# Patient Record
Sex: Male | Born: 1945 | Race: Black or African American | Hispanic: No | Marital: Married | Smoking: Former smoker
Health system: Southern US, Community
[De-identification: ages and names within clinical notes are randomized; demographics above are authoritative.]

## PROBLEM LIST (undated history)

## (undated) DIAGNOSIS — G4733 Obstructive sleep apnea (adult) (pediatric): Secondary | ICD-10-CM

## (undated) DIAGNOSIS — I219 Acute myocardial infarction, unspecified: Secondary | ICD-10-CM

## (undated) DIAGNOSIS — I10 Essential (primary) hypertension: Secondary | ICD-10-CM

## (undated) DIAGNOSIS — J449 Chronic obstructive pulmonary disease, unspecified: Secondary | ICD-10-CM

## (undated) DIAGNOSIS — I639 Cerebral infarction, unspecified: Secondary | ICD-10-CM

## (undated) DIAGNOSIS — I509 Heart failure, unspecified: Secondary | ICD-10-CM

## (undated) HISTORY — PX: PERCUTANEOUS CORONARY STENT INTERVENTION (PCI-S): SHX6016

## (undated) HISTORY — DX: Acute myocardial infarction, unspecified: I21.9

## (undated) HISTORY — DX: Heart failure, unspecified: I50.9

## (undated) HISTORY — DX: Essential (primary) hypertension: I10

## (undated) HISTORY — DX: Obstructive sleep apnea (adult) (pediatric): G47.33

## (undated) HISTORY — DX: Chronic obstructive pulmonary disease, unspecified: J44.9

---

## 2008-12-06 ENCOUNTER — Ambulatory Visit: Payer: Self-pay | Admitting: Cardiology

## 2010-01-21 ENCOUNTER — Ambulatory Visit: Payer: Self-pay | Admitting: Cardiology

## 2010-01-21 ENCOUNTER — Inpatient Hospital Stay (HOSPITAL_COMMUNITY)
Admission: EM | Admit: 2010-01-21 | Discharge: 2010-01-23 | Payer: Self-pay | Source: Home / Self Care | Admitting: Emergency Medicine

## 2010-01-21 IMAGING — CT CT ANGIO CHEST
1 of 2 series · 19 of 32 positions shown · IV contrast (APPLIED)
Comparison: Chest radiograph [DATE].

CLINICAL DATA: Congestive heart failure.  Pleural effusion.
Shortness of breath.  Elevated D-dimer.  Chest pain.

CT ANGIOGRAPHY CHEST WITH CONTRAST
TECHNIQUE: Multidetector CT imaging of the chest was performed
using the standard protocol during bolus administration of
intravenous contrast.  Multiplanar CT image reconstructions
including MIPs were obtained to evaluate the vascular anatomy.
Contrast:  100 ml Omnipaque 300

[Series 5: pe thins @ 1mm · axial · 0.70mm/px · z∈[+1153,+1405]mm · 19 of 276 slices shown]
[im 12/276  lung]
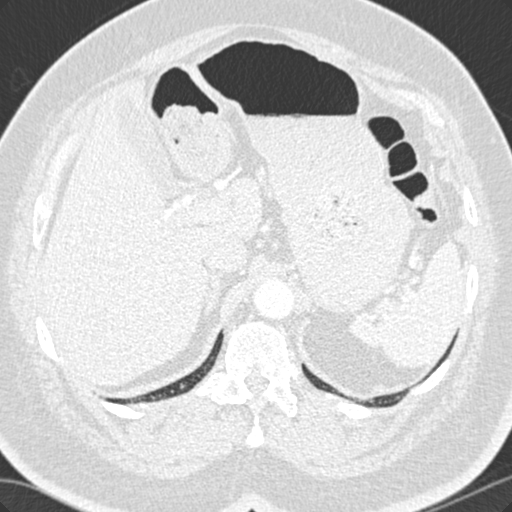
[im 24/276  soft-tissue]
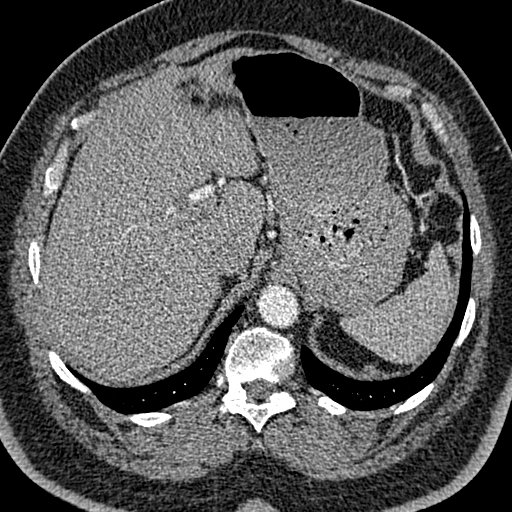
[im 36/276  lung]
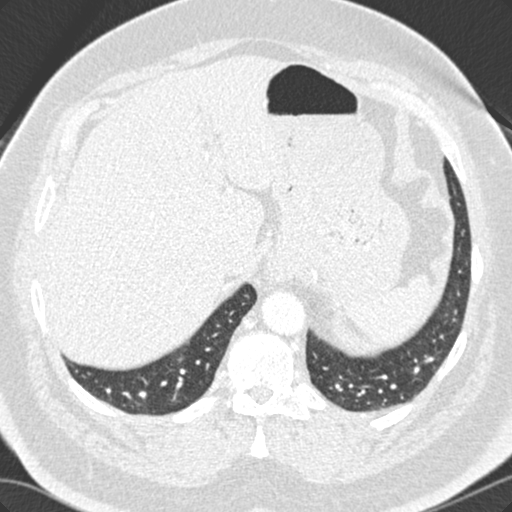
[im 60/276  soft-tissue]
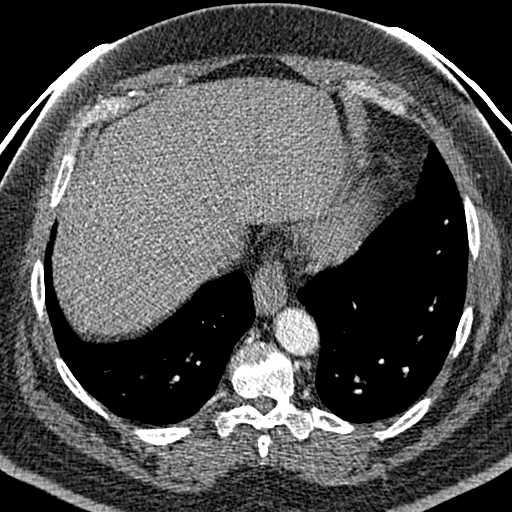
[im 72/276  lung]
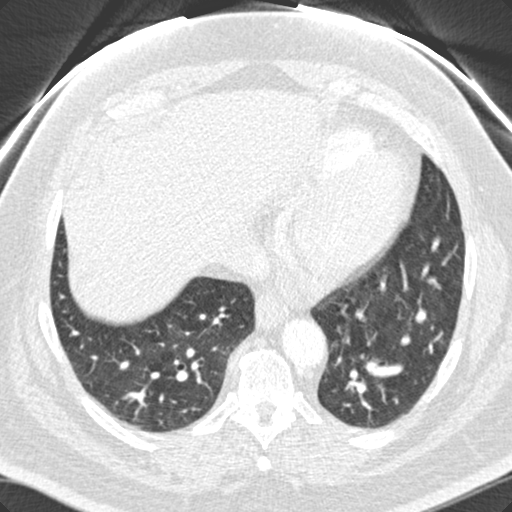
[im 84/276  soft-tissue]
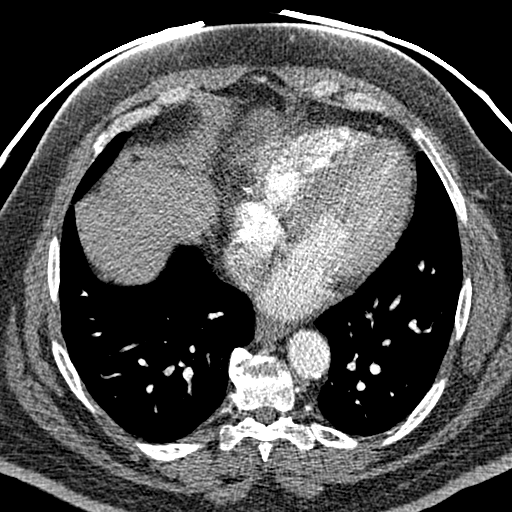
[im 96/276  lung]
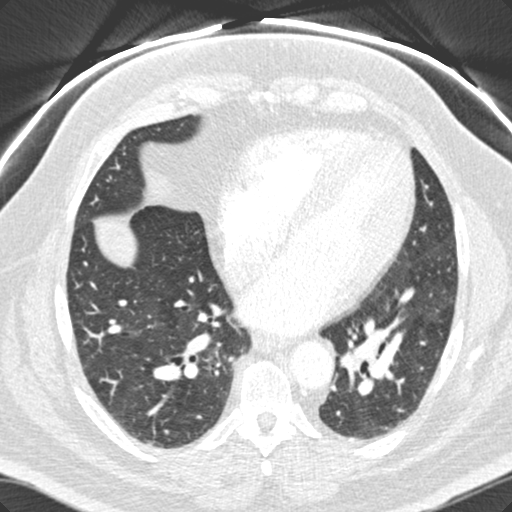
[im 108/276  soft-tissue]
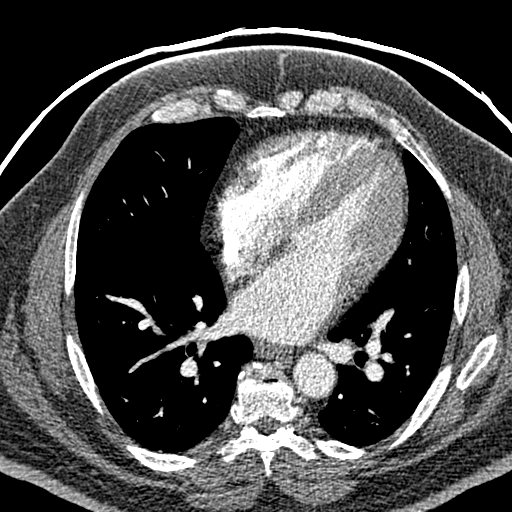
[im 120/276  lung]
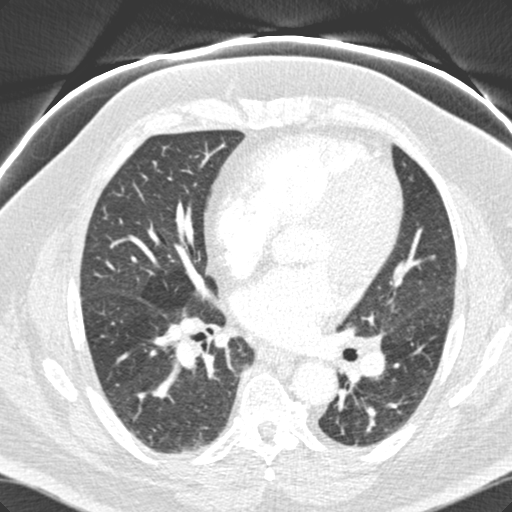
[im 144/276  soft-tissue]
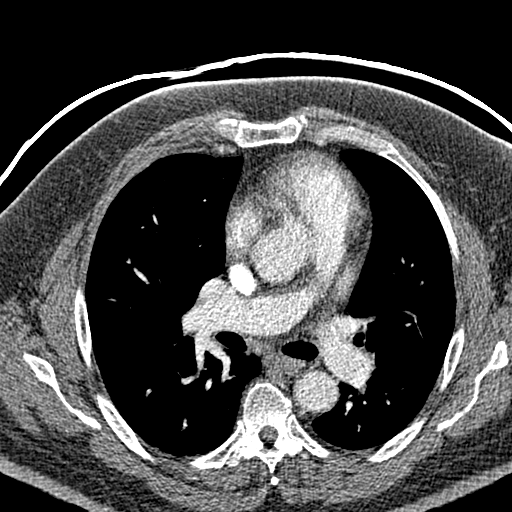
[im 156/276  lung]
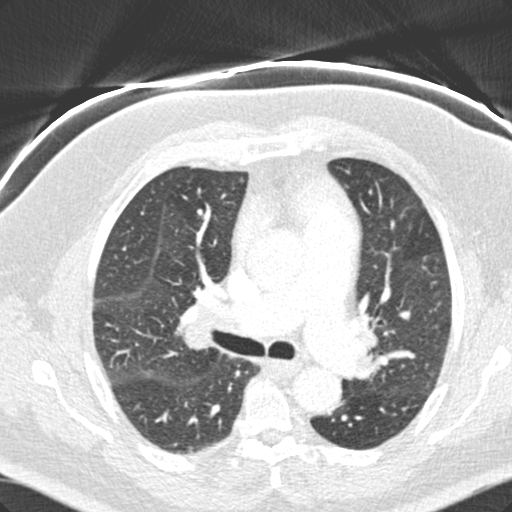
[im 168/276  soft-tissue]
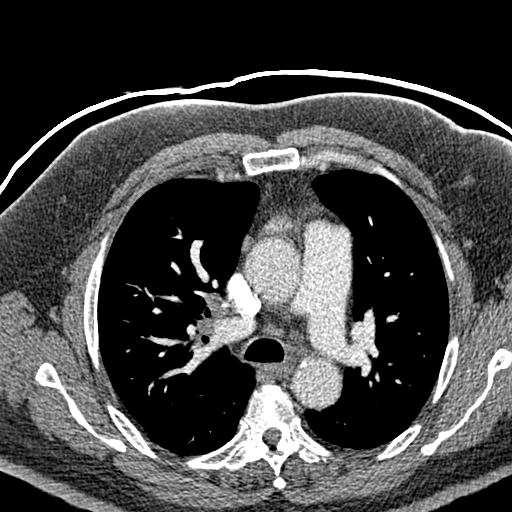
[im 180/276  lung]
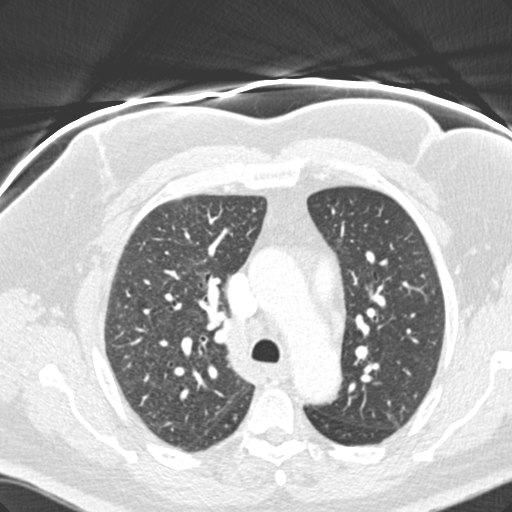
[im 192/276  soft-tissue]
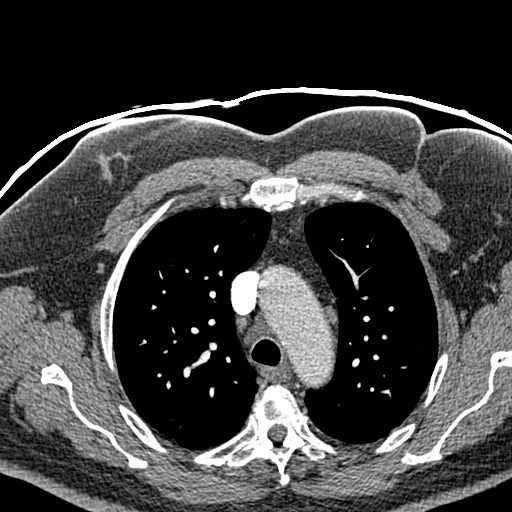
[im 204/276  lung]
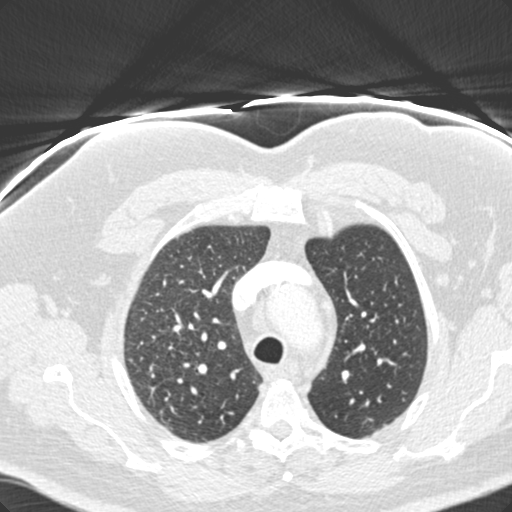
[im 216/276  soft-tissue]
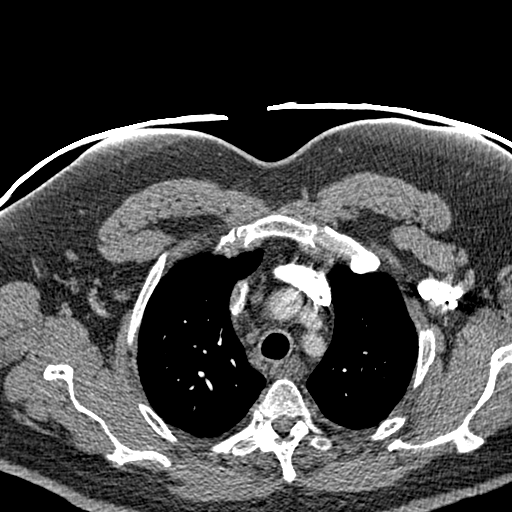
[im 240/276  lung]
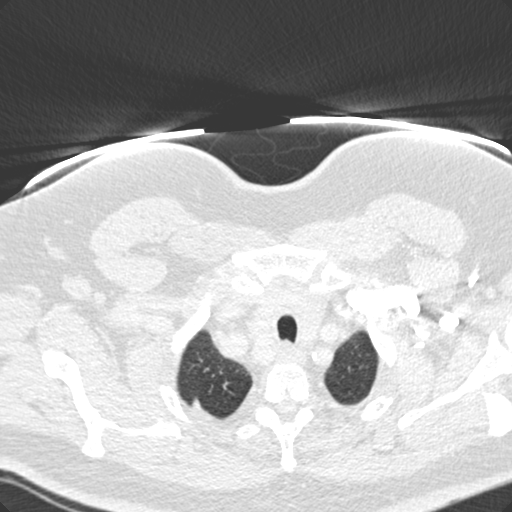
[im 252/276  soft-tissue]
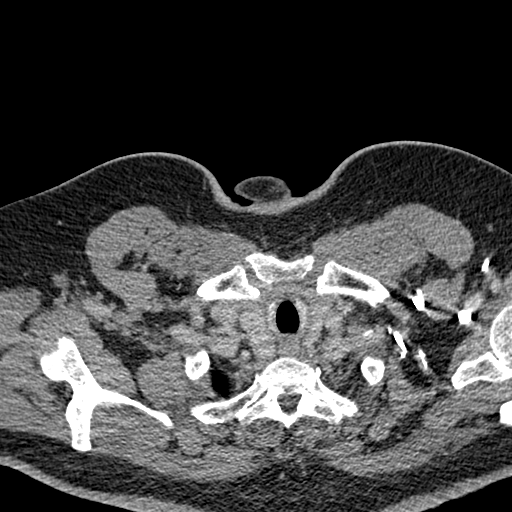
[im 264/276  lung]
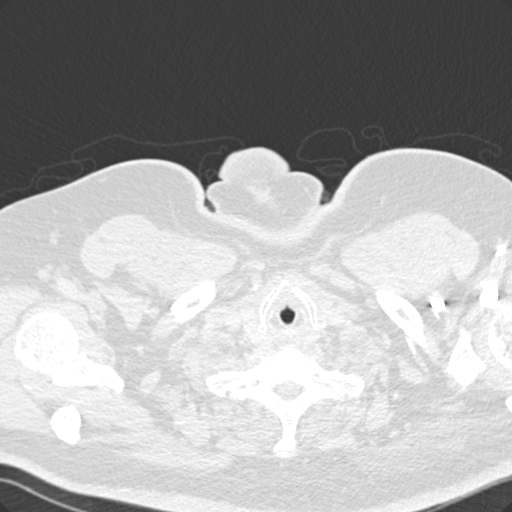

[19 of 32 positions shown; findings below may reference images not displayed]

FINDINGS: Pulmonary arterial opacification is suboptimal.  The
study is diagnostic, including the segmental vessels.  No focal
filling defects are evident to suggest pulmonary embolus.  Mild
atherosclerotic calcifications are noted in the aorta and branch
vessels.  Atherosclerotic calcifications are also noted in the
coronary arteries.  A pretracheal lymph node measures 12 mm in
short axis.  Sub centimeter right paratracheal nodes are evident.
No significant hilar adenopathy is present.  There is no
significant axillary adenopathy.

Mild intralobular septal thickening is noted, particularly the
right lung.  Scattered ground-glass attenuation suggests edema as
well.  No focal airspace consolidation is evident.

The bone windows demonstrate mild endplate degenerative change
across multiple levels.  There is mild exaggeration of the thoracic
kyphosis.  No focal lytic or blastic lesion is evident.

Review of the MIP images confirms the above findings.
IMPRESSION: 1.  No evidence for pulmonary embolus.
2.  Mild cardiomegaly.
3.  Atherosclerotic change including coronary artery disease.
4.  Edematous changes of the lungs bilaterally.
5.  Mediastinal adenopathy.  This is likely reactive in nature.
Follow-up CT of the chest in 2-3 months, after resolution of the
current symptoms, could be useful for further evaluation as
clinically indicated.

## 2010-01-22 ENCOUNTER — Encounter (INDEPENDENT_AMBULATORY_CARE_PROVIDER_SITE_OTHER): Payer: Self-pay | Admitting: Internal Medicine

## 2010-05-05 LAB — DIFFERENTIAL
Basophils Absolute: 0 10*3/uL (ref 0.0–0.1)
Basophils Relative: 0 % (ref 0–1)
Eosinophils Absolute: 0 10*3/uL (ref 0.0–0.7)
Eosinophils Relative: 0 % (ref 0–5)
Lymphocytes Relative: 12 % (ref 12–46)
Lymphs Abs: 0.9 10*3/uL (ref 0.7–4.0)
Monocytes Absolute: 0.1 10*3/uL (ref 0.1–1.0)
Monocytes Relative: 1 % — ABNORMAL LOW (ref 3–12)
Neutro Abs: 6.7 10*3/uL (ref 1.7–7.7)
Neutrophils Relative %: 87 % — ABNORMAL HIGH (ref 43–77)

## 2010-05-05 LAB — LIPID PANEL
Cholesterol: 164 mg/dL (ref 0–200)
HDL: 59 mg/dL (ref >39)
LDL Cholesterol: 96 mg/dL (ref 0–99)
Total CHOL/HDL Ratio: 2.8 RATIO
Triglycerides: 45 mg/dL (ref <150)
VLDL: 9 mg/dL (ref 0–40)

## 2010-05-05 LAB — TROPONIN I
Troponin I: 0.01 ng/mL (ref 0.00–0.06)
Troponin I: 0.01 ng/mL (ref 0.00–0.06)

## 2010-05-05 LAB — CBC
HCT: 42.8 % (ref 39.0–52.0)
Hemoglobin: 14.7 g/dL (ref 13.0–17.0)
MCH: 32 pg (ref 26.0–34.0)
MCHC: 34.3 g/dL (ref 30.0–36.0)
MCV: 93 fL (ref 78.0–100.0)
Platelets: 211 10*3/uL (ref 150–400)
RBC: 4.6 MIL/uL (ref 4.22–5.81)
RDW: 13.5 % (ref 11.5–15.5)
WBC: 7.7 10*3/uL (ref 4.0–10.5)

## 2010-05-05 LAB — BASIC METABOLIC PANEL
BUN: 21 mg/dL (ref 6–23)
BUN: 22 mg/dL (ref 6–23)
CO2: 29 mEq/L (ref 19–32)
CO2: 32 mEq/L (ref 19–32)
Calcium: 9.4 mg/dL (ref 8.4–10.5)
Calcium: 9.5 mg/dL (ref 8.4–10.5)
Chloride: 103 mEq/L (ref 96–112)
Chloride: 104 mEq/L (ref 96–112)
Creatinine, Ser: 1.54 mg/dL — ABNORMAL HIGH (ref 0.4–1.5)
Creatinine, Ser: 1.58 mg/dL — ABNORMAL HIGH (ref 0.4–1.5)
GFR calc Af Amer: 53 mL/min — ABNORMAL LOW (ref >60)
GFR calc Af Amer: 55 mL/min — ABNORMAL LOW (ref >60)
GFR calc non Af Amer: 44 mL/min — ABNORMAL LOW (ref >60)
GFR calc non Af Amer: 45 mL/min — ABNORMAL LOW (ref >60)
Glucose, Bld: 141 mg/dL — ABNORMAL HIGH (ref 70–99)
Glucose, Bld: 142 mg/dL — ABNORMAL HIGH (ref 70–99)
Potassium: 4 mEq/L (ref 3.5–5.1)
Potassium: 4.1 mEq/L (ref 3.5–5.1)
Sodium: 142 mEq/L (ref 135–145)
Sodium: 143 mEq/L (ref 135–145)

## 2010-05-05 LAB — CK TOTAL AND CKMB (NOT AT ARMC)
CK, MB: 1.1 ng/mL (ref 0.3–4.0)
CK, MB: 1.1 ng/mL (ref 0.3–4.0)
Relative Index: 0.8 (ref 0.0–2.5)
Relative Index: 0.9 (ref 0.0–2.5)
Total CK: 129 U/L (ref 7–232)
Total CK: 139 U/L (ref 7–232)

## 2010-05-05 LAB — BRAIN NATRIURETIC PEPTIDE: Pro B Natriuretic peptide (BNP): <30 pg/mL (ref 0.0–100.0)

## 2010-05-05 LAB — PROTIME-INR
INR: 1.09 (ref 0.00–1.49)
Prothrombin Time: 14.3 seconds (ref 11.6–15.2)

## 2010-05-05 LAB — HEMOGLOBIN A1C
Hgb A1c MFr Bld: 6.1 % — ABNORMAL HIGH (ref <5.7)
Mean Plasma Glucose: 128 mg/dL — ABNORMAL HIGH (ref <117)

## 2010-05-05 LAB — TSH: TSH: 0.191 u[IU]/mL — ABNORMAL LOW (ref 0.350–4.500)

## 2010-05-05 LAB — MAGNESIUM: Magnesium: 2.3 mg/dL (ref 1.5–2.5)

## 2010-05-05 LAB — PHOSPHORUS: Phosphorus: 3.6 mg/dL (ref 2.3–4.6)

## 2010-05-06 LAB — POCT CARDIAC MARKERS
CKMB, poc: <1 ng/mL — ABNORMAL LOW (ref 1.0–8.0)
Myoglobin, poc: 126 ng/mL (ref 12–200)
Troponin i, poc: <0.05 ng/mL (ref 0.00–0.09)

## 2010-05-06 LAB — URINALYSIS, ROUTINE W REFLEX MICROSCOPIC
Bilirubin Urine: NEGATIVE
Glucose, UA: NEGATIVE mg/dL
Hgb urine dipstick: NEGATIVE
Ketones, ur: NEGATIVE mg/dL
Nitrite: NEGATIVE
Protein, ur: NEGATIVE mg/dL
Specific Gravity, Urine: 1.018 (ref 1.005–1.030)
Urobilinogen, UA: 1 mg/dL (ref 0.0–1.0)
pH: 6 (ref 5.0–8.0)

## 2010-05-06 LAB — BASIC METABOLIC PANEL
BUN: 19 mg/dL (ref 6–23)
CO2: 31 mEq/L (ref 19–32)
Calcium: 9.4 mg/dL (ref 8.4–10.5)
Chloride: 101 mEq/L (ref 96–112)
Creatinine, Ser: 1.76 mg/dL — ABNORMAL HIGH (ref 0.4–1.5)
GFR calc Af Amer: 47 mL/min — ABNORMAL LOW (ref >60)
GFR calc non Af Amer: 39 mL/min — ABNORMAL LOW (ref >60)
Glucose, Bld: 140 mg/dL — ABNORMAL HIGH (ref 70–99)
Potassium: 3 mEq/L — ABNORMAL LOW (ref 3.5–5.1)
Sodium: 142 mEq/L (ref 135–145)

## 2010-05-06 LAB — CK TOTAL AND CKMB (NOT AT ARMC)
CK, MB: 1.2 ng/mL (ref 0.3–4.0)
Relative Index: 0.9 (ref 0.0–2.5)
Total CK: 134 U/L (ref 7–232)

## 2010-05-06 LAB — TROPONIN I: Troponin I: 0.02 ng/mL (ref 0.00–0.06)

## 2010-05-06 LAB — DIFFERENTIAL
Basophils Absolute: 0 10*3/uL (ref 0.0–0.1)
Basophils Relative: 0 % (ref 0–1)
Eosinophils Absolute: 0.1 10*3/uL (ref 0.0–0.7)
Eosinophils Relative: 1 % (ref 0–5)
Lymphocytes Relative: 24 % (ref 12–46)
Lymphs Abs: 2.2 10*3/uL (ref 0.7–4.0)
Monocytes Absolute: 0.7 10*3/uL (ref 0.1–1.0)
Monocytes Relative: 8 % (ref 3–12)
Neutro Abs: 6.3 10*3/uL (ref 1.7–7.7)
Neutrophils Relative %: 67 % (ref 43–77)

## 2010-05-06 LAB — D-DIMER, QUANTITATIVE (NOT AT ARMC): D-Dimer, Quant: 1.29 ug/mL-FEU — ABNORMAL HIGH (ref 0.00–0.48)

## 2010-05-06 LAB — CBC
HCT: 44.8 % (ref 39.0–52.0)
Hemoglobin: 15.5 g/dL (ref 13.0–17.0)
MCH: 32.4 pg (ref 26.0–34.0)
MCHC: 34.6 g/dL (ref 30.0–36.0)
MCV: 93.5 fL (ref 78.0–100.0)
Platelets: 203 10*3/uL (ref 150–400)
RBC: 4.79 MIL/uL (ref 4.22–5.81)
RDW: 13.3 % (ref 11.5–15.5)
WBC: 9.3 10*3/uL (ref 4.0–10.5)

## 2010-05-06 LAB — BRAIN NATRIURETIC PEPTIDE: Pro B Natriuretic peptide (BNP): <30 pg/mL (ref 0.0–100.0)

## 2016-12-16 ENCOUNTER — Other Ambulatory Visit: Payer: Self-pay | Admitting: *Deleted

## 2016-12-16 NOTE — Patient Outreach (Signed)
Telephone screen attempted for [facility] member. No one answered the phone, I was able to leave a voice message and requested a return call.  I will call this gentleman another day.  Eulah Pont. [name], MSN, [facility] Gerontological Nurse Practitioner [facility] Care Management [phone]

## 2016-12-21 ENCOUNTER — Other Ambulatory Visit: Payer: Self-pay | Admitting: *Deleted

## 2016-12-21 NOTE — Patient Outreach (Signed)
[employer] screening call attempted, unable to leave a message. I will call this gentleman another day.  Charles Sherman. [name], MSN, [facility] Gerontological Nurse Practitioner [facility] Care Management [phone]

## 2016-12-21 NOTE — Patient Outreach (Signed)
Second attempt to complete [facility] HRA. Unable to reach anyone at the provided phone numbers. I will try again and if not successful will send a letter.  Charles Sherman. [name], MSN, [facility] Gerontological Nurse Practitioner [facility] Care Management [phone]

## 2016-12-27 ENCOUNTER — Other Ambulatory Visit: Payer: Self-pay | Admitting: *Deleted

## 2016-12-27 ENCOUNTER — Encounter: Payer: Self-pay | Admitting: *Deleted

## 2016-12-27 NOTE — Patient Outreach (Signed)
Third attempt to complete [facility] HRA assessment unsuccessful. I did leave another message and advised I would be send information about [place] Management services.  Eulah Pont. [name], MSN, [facility] Gerontological Nurse Practitioner [facility] Care Management [phone]

## 2016-12-28 ENCOUNTER — Encounter: Payer: Self-pay | Admitting: *Deleted

## 2019-01-08 ENCOUNTER — Other Ambulatory Visit: Payer: Self-pay

## 2019-01-08 ENCOUNTER — Encounter (INDEPENDENT_AMBULATORY_CARE_PROVIDER_SITE_OTHER): Payer: No Typology Code available for payment source | Admitting: Ophthalmology

## 2019-01-08 DIAGNOSIS — H35033 Hypertensive retinopathy, bilateral: Secondary | ICD-10-CM | POA: Diagnosis not present

## 2019-01-08 DIAGNOSIS — H43813 Vitreous degeneration, bilateral: Secondary | ICD-10-CM

## 2019-01-08 DIAGNOSIS — H2513 Age-related nuclear cataract, bilateral: Secondary | ICD-10-CM

## 2019-01-08 DIAGNOSIS — H3411 Central retinal artery occlusion, right eye: Secondary | ICD-10-CM

## 2019-01-08 DIAGNOSIS — I10 Essential (primary) hypertension: Secondary | ICD-10-CM

## 2019-01-10 ENCOUNTER — Encounter (INDEPENDENT_AMBULATORY_CARE_PROVIDER_SITE_OTHER): Payer: Self-pay | Admitting: Ophthalmology

## 2019-02-05 ENCOUNTER — Other Ambulatory Visit: Payer: Self-pay | Admitting: Family Medicine

## 2019-02-05 ENCOUNTER — Other Ambulatory Visit (HOSPITAL_COMMUNITY): Payer: Self-pay | Admitting: Family Medicine

## 2019-02-05 DIAGNOSIS — H3410 Central retinal artery occlusion, unspecified eye: Secondary | ICD-10-CM

## 2019-02-07 ENCOUNTER — Encounter (INDEPENDENT_AMBULATORY_CARE_PROVIDER_SITE_OTHER): Payer: No Typology Code available for payment source | Admitting: Ophthalmology

## 2019-02-07 ENCOUNTER — Other Ambulatory Visit: Payer: Self-pay

## 2019-02-07 DIAGNOSIS — H35033 Hypertensive retinopathy, bilateral: Secondary | ICD-10-CM

## 2019-02-07 DIAGNOSIS — H43813 Vitreous degeneration, bilateral: Secondary | ICD-10-CM

## 2019-02-07 DIAGNOSIS — I10 Essential (primary) hypertension: Secondary | ICD-10-CM

## 2019-02-07 DIAGNOSIS — H3411 Central retinal artery occlusion, right eye: Secondary | ICD-10-CM

## 2019-02-07 DIAGNOSIS — H2513 Age-related nuclear cataract, bilateral: Secondary | ICD-10-CM

## 2019-02-27 ENCOUNTER — Ambulatory Visit (HOSPITAL_COMMUNITY): Payer: No Typology Code available for payment source

## 2019-02-27 ENCOUNTER — Encounter (HOSPITAL_COMMUNITY): Payer: Self-pay

## 2019-02-27 ENCOUNTER — Encounter: Payer: Self-pay | Admitting: *Deleted

## 2019-02-28 ENCOUNTER — Ambulatory Visit: Payer: Self-pay | Admitting: Cardiology

## 2019-02-28 ENCOUNTER — Other Ambulatory Visit (HOSPITAL_COMMUNITY): Payer: Self-pay | Admitting: Family Medicine

## 2019-02-28 DIAGNOSIS — H3410 Central retinal artery occlusion, unspecified eye: Secondary | ICD-10-CM

## 2019-02-28 NOTE — Progress Notes (Deleted)
Clinical Summary Charles Sherman is a 74 y.o.male  1. CAD - notes indicate multiple prior PCIs - from [place] notes Aug 2018 nuclear stress without ischemia  ?stress   2. Heart failure with preserved LVEF - notes indicate normal LVEF with mild diastolic dysfunction by [id number] echo   3. OSA on cpap  4. HTN   5. Hyperlipidemia   6. COPD on home O2  Past Medical History:  Diagnosis Date  . CHF (congestive heart failure) ([place])   . COPD (chronic obstructive pulmonary disease) ([place])   . HTN (hypertension)   . Myocardial infarct ([place])   . OSA (obstructive sleep apnea)    on CPAP     Allergies  Allergen Reactions  . Shrimp [Shellfish Allergy]      Current Outpatient Medications  Medication Sig Dispense Refill  . albuterol (VENTOLIN HFA) 108 (90 Base) MCG/ACT inhaler Inhale 2 puffs into the lungs every 6 (six) hours as needed for wheezing or shortness of breath.    Marland Kitchen amLODipine (NORVASC) 10 MG tablet Take 5 mg by mouth daily.    Marland Kitchen aspirin EC 81 MG tablet Take 81 mg by mouth daily.    Marland Kitchen atorvastatin (LIPITOR) 40 MG tablet Take 40 mg by mouth daily.    Marland Kitchen docusate sodium (COLACE) 100 MG capsule Take 100 mg by mouth daily as needed for mild constipation.    . furosemide (LASIX) 80 MG tablet Take 80 mg by mouth 2 (two) times daily.    . hydrochlorothiazide (HYDRODIURIL) 25 MG tablet Take 25 mg by mouth 3 (three) times daily. Do not take if SBP is <100    . isosorbide mononitrate (IMDUR) 120 MG 24 hr tablet Take 120 mg by mouth daily.    . lansoprazole (PREVACID) 15 MG capsule Take 15 mg by mouth daily as needed.    . metolazone (ZAROXOLYN) 2.5 MG tablet Take 2.5 mg by mouth once a week. On Mondays 30 minutes before furosemide. May take twice daily as needed    . metoprolol tartrate (LOPRESSOR) 50 MG tablet Take 50 mg by mouth 2 (two) times daily. Do not take if pulse is < 55 or SBP is < 100    . Multiple Vitamin (MULTIVITAMIN) tablet Take 1 tablet by mouth daily.    .  nitroGLYCERIN (NITROSTAT) 0.4 MG SL tablet Place 0.4 mg under the tongue every 5 (five) minutes x 3 doses as needed for chest pain (If no relief after 3 rd dose report to the ED).    Marland Kitchen nystatin cream (MYCOSTATIN) Apply 1 application topically 2 (two) times daily as needed for dry skin. 15 gm    . potassium chloride (KLOR-CON) 10 MEQ tablet Take 20 mEq by mouth 2 (two) times daily. Except take three tablets twice day on Mondays only     No current facility-administered medications for this visit.     Past Surgical History:  Procedure Laterality Date  . PERCUTANEOUS CORONARY STENT INTERVENTION (PCI-S)       Allergies  Allergen Reactions  . Shrimp [Shellfish Allergy]       No family history on file.   Social History Mr. Whelihan reports that he has been smoking cigarettes. He has been smoking about 0.50 packs per day. He does not have any smokeless tobacco history on file. Mr. Belli has no history on file for alcohol.   Review of Systems CONSTITUTIONAL: No weight loss, fever, chills, weakness or fatigue.  HEENT: Eyes: No visual loss, blurred vision, double  vision or yellow sclerae.No hearing loss, sneezing, congestion, runny nose or sore throat.  SKIN: No rash or itching.  CARDIOVASCULAR:  RESPIRATORY: No shortness of breath, cough or sputum.  GASTROINTESTINAL: No anorexia, nausea, vomiting or diarrhea. No abdominal pain or blood.  GENITOURINARY: No burning on urination, no polyuria NEUROLOGICAL: No headache, dizziness, syncope, paralysis, ataxia, numbness or tingling in the extremities. No change in bowel or bladder control.  MUSCULOSKELETAL: No muscle, back pain, joint pain or stiffness.  LYMPHATICS: No enlarged nodes. No history of splenectomy.  PSYCHIATRIC: No history of depression or anxiety.  ENDOCRINOLOGIC: No reports of sweating, cold or heat intolerance. No polyuria or polydipsia.  Marland Kitchen   Physical Examination There were no vitals filed for this visit. There  were no vitals filed for this visit.  Gen: resting comfortably, no acute distress HEENT: no scleral icterus, pupils equal round and reactive, no palptable cervical adenopathy,  CV Resp: Clear to auscultation bilaterally GI: abdomen is soft, non-tender, non-distended, normal bowel sounds, no hepatosplenomegaly MSK: extremities are warm, no edema.  Skin: warm, no rash Neuro:  no focal deficits Psych: appropriate affect   Diagnostic Studies  07/2015 [facility] LVEF "norma" but no percentage given, grade I DDX, normal LA   Assessment and Plan        Arnoldo Lenis, M.D., F.A.C.C.

## 2019-03-08 ENCOUNTER — Ambulatory Visit (HOSPITAL_COMMUNITY)
Admission: RE | Admit: 2019-03-08 | Discharge: 2019-03-08 | Disposition: A | Discharge status: Home / Self Care | Payer: No Typology Code available for payment source | Source: Ambulatory Visit | Attending: Family Medicine | Admitting: Family Medicine

## 2019-03-08 ENCOUNTER — Other Ambulatory Visit: Payer: Self-pay

## 2019-03-08 DIAGNOSIS — H3410 Central retinal artery occlusion, unspecified eye: Secondary | ICD-10-CM | POA: Insufficient documentation

## 2019-03-08 IMAGING — US US CAROTID DUPLEX BILAT
1 series · 13 of 24 positions shown · non-contrast
Comparison: None.

CLINICAL DATA: Right retinal artery occlusion

EXAM:
BILATERAL CAROTID DUPLEX ULTRASOUND
TECHNIQUE: Gray scale imaging, color Doppler and duplex ultrasound were
performed of bilateral carotid and vertebral arteries in the neck.

[Series 1: us carotid duplex bilat · 13 of 70 slices shown]
[im 1/70]
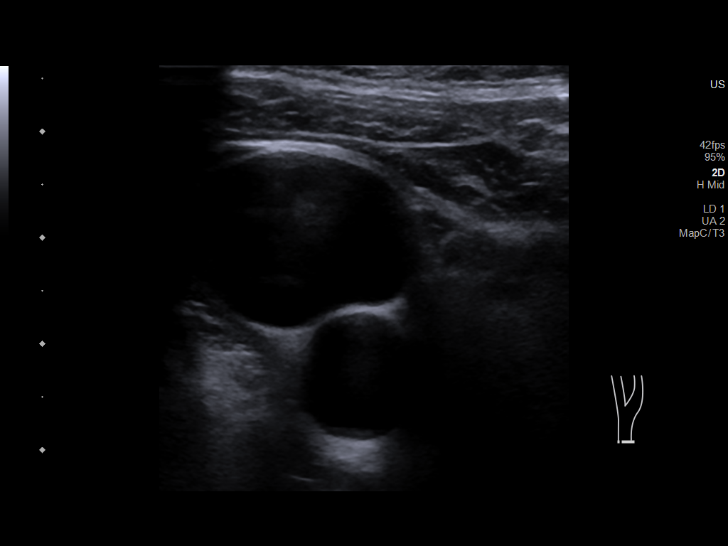
[im 7/70]
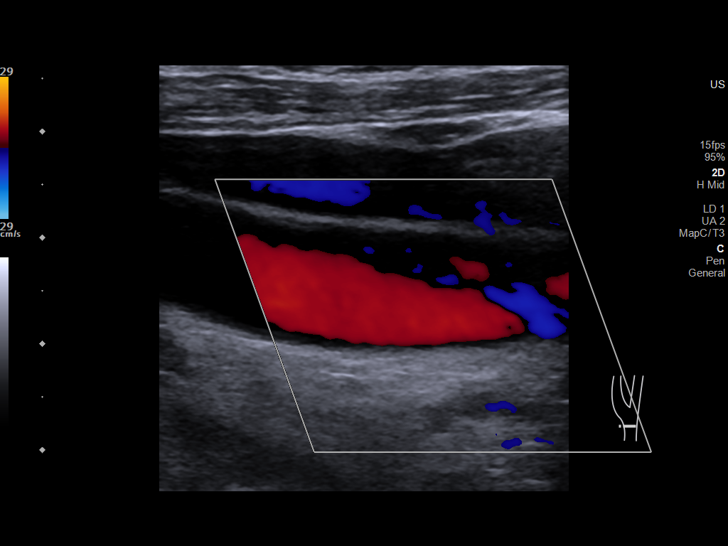
[im 13/70]
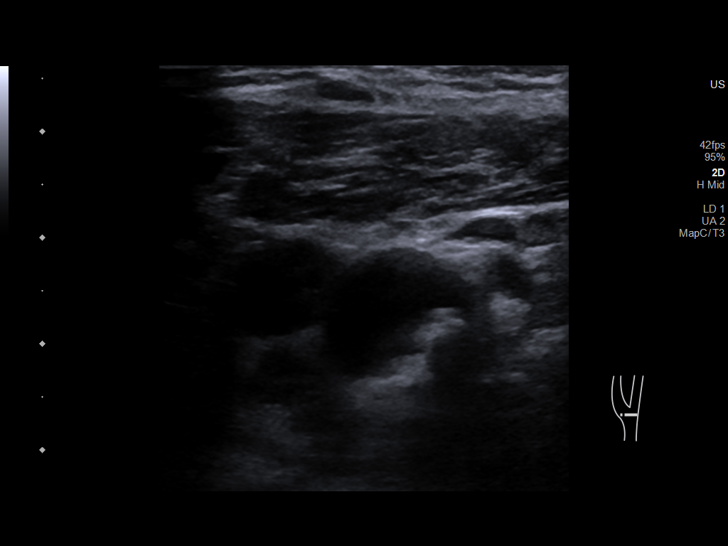
[im 19/70]
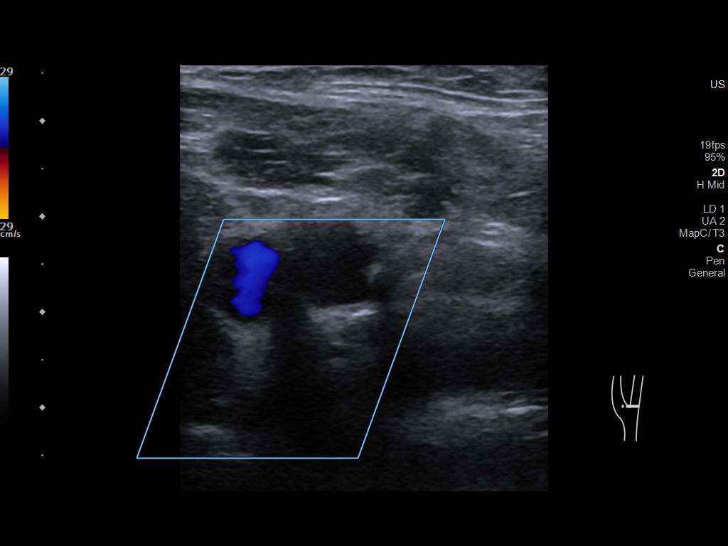
[im 25/70]
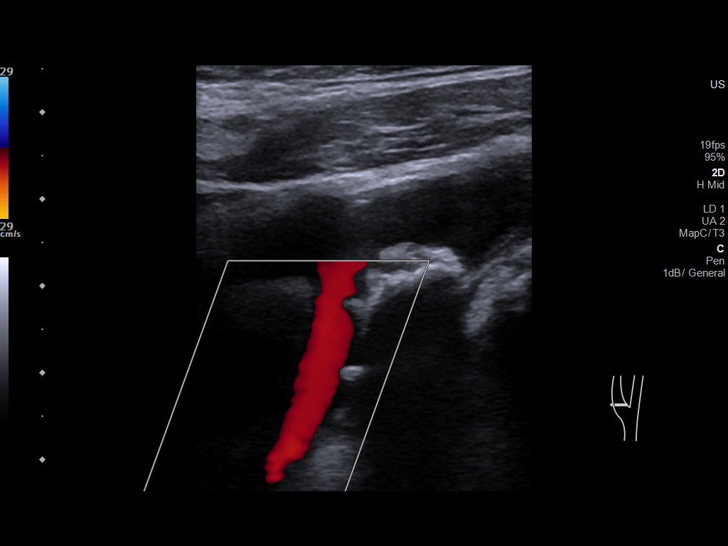
[im 31/70]
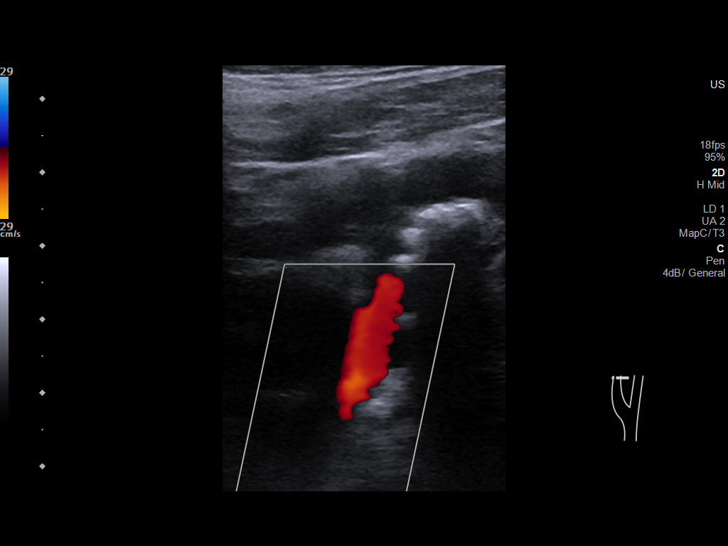
[im 37/70]
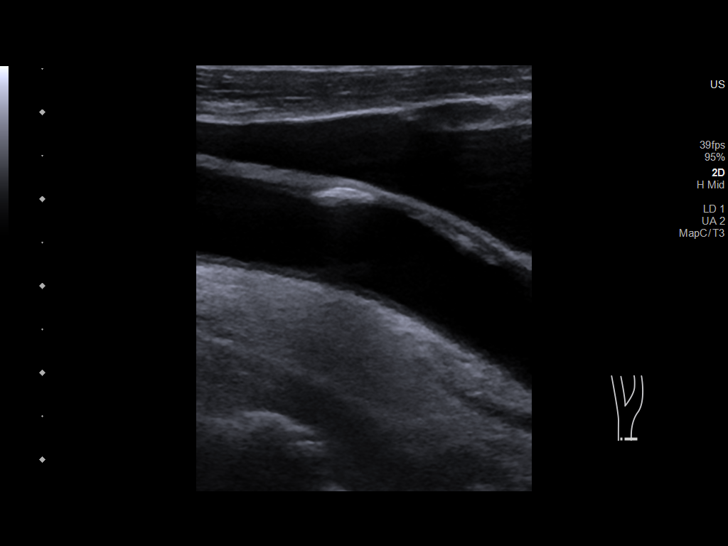
[im 40/70]
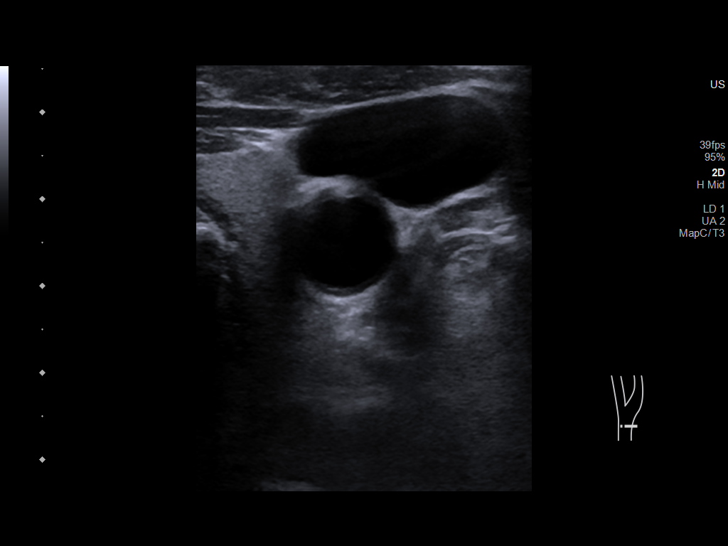
[im 46/70]
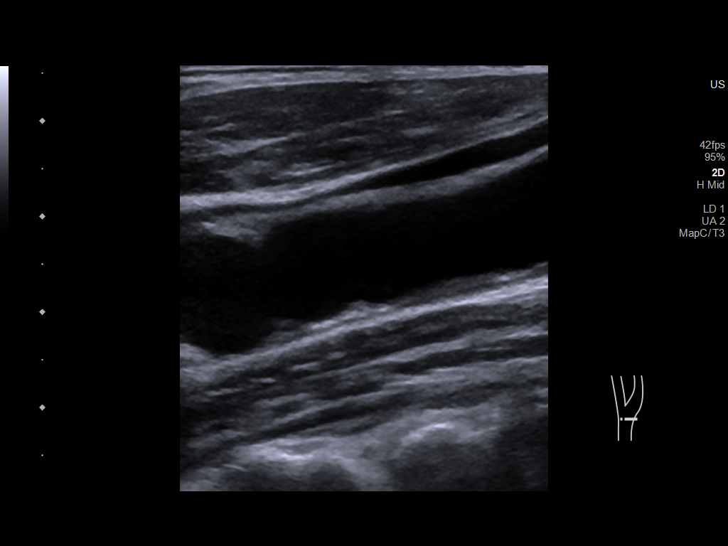
[im 52/70]
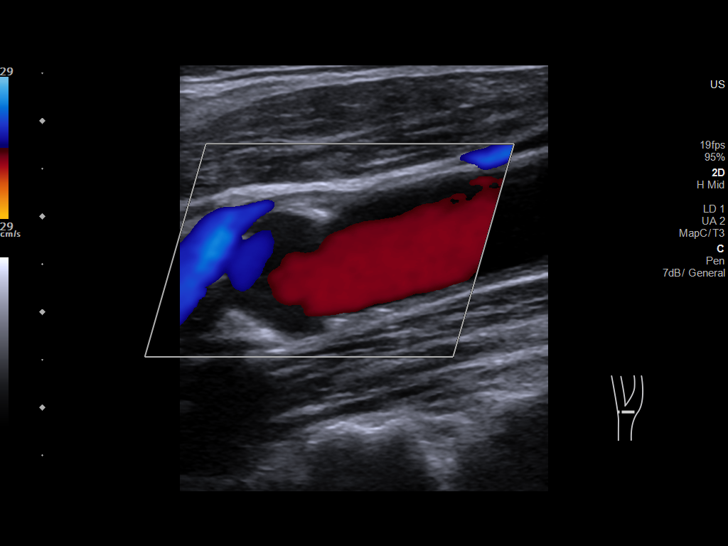
[im 58/70]
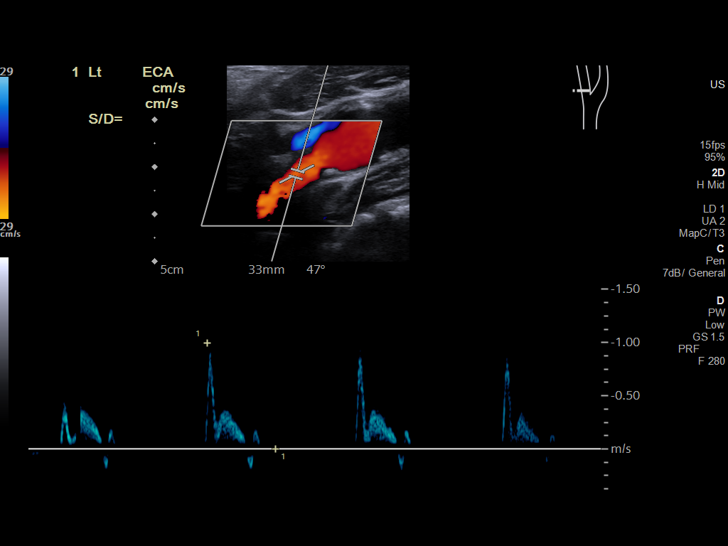
[im 64/70]
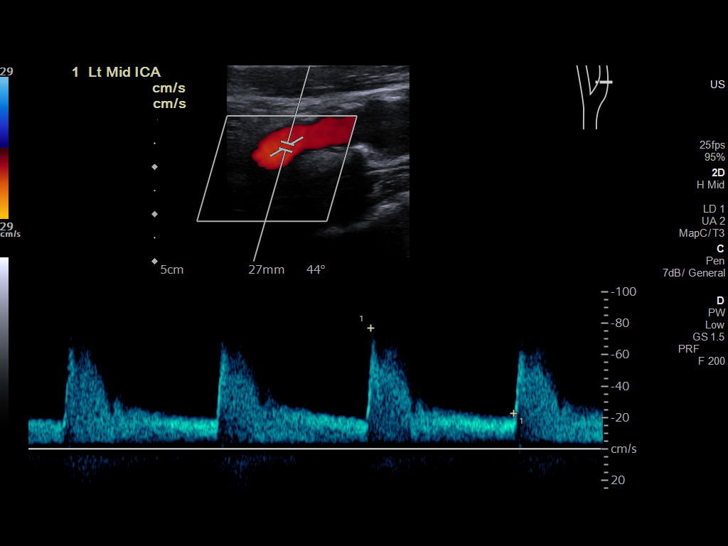
[im 70/70]
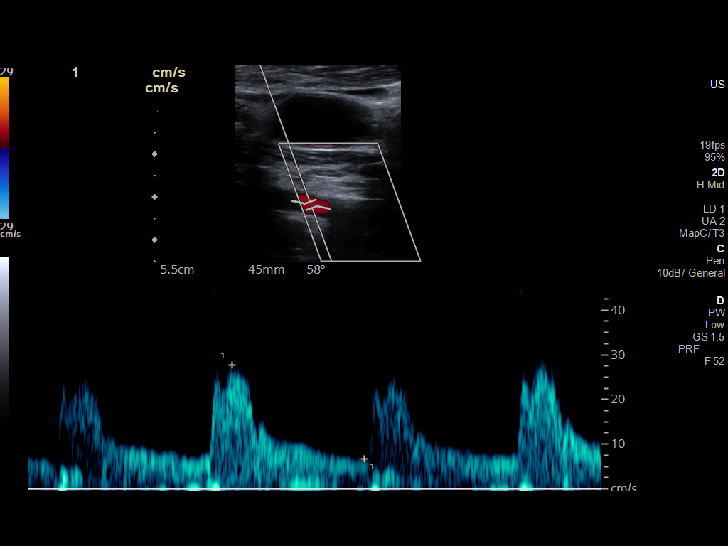

[13 of 24 positions shown; findings below may reference images not displayed]

FINDINGS: Criteria: Quantification of carotid stenosis is based on velocity
parameters that correlate the residual internal carotid diameter
with NASCET-based stenosis levels, using the diameter of the distal
internal carotid lumen as the denominator for stenosis measurement.

The following velocity measurements were obtained:

RIGHT

ICA: 59/10 cm/sec

CCA: 69/11 cm/sec

SYSTOLIC ICA/CCA RATIO:

ECA: 94 cm/sec

LEFT

ICA: 77/23 cm/sec

CCA: 74/10 cm/sec

SYSTOLIC ICA/CCA RATIO:

ECA: 100 cm/sec

RIGHT CAROTID ARTERY: Moderate echogenic shadowing plaque formation.
No hemodynamically significant right ICA stenosis, velocity
elevation, or turbulent flow. Degree of narrowing less than 50%.

RIGHT VERTEBRAL ARTERY:  Antegrade

LEFT CAROTID ARTERY: Similar scattered minor echogenic plaque
formation. No hemodynamically significant left ICA stenosis,
velocity elevation, or turbulent flow.

LEFT VERTEBRAL ARTERY:  Antegrade
IMPRESSION: Bilateral carotid atherosclerosis. No hemodynamically significant
ICA stenosis. Degree of narrowing less than 50% bilaterally by
ultrasound criteria.

Patent antegrade vertebral flow bilaterally

## 2019-03-19 ENCOUNTER — Other Ambulatory Visit: Payer: Self-pay

## 2019-03-19 ENCOUNTER — Ambulatory Visit (INDEPENDENT_AMBULATORY_CARE_PROVIDER_SITE_OTHER): Payer: No Typology Code available for payment source | Admitting: Cardiology

## 2019-03-19 ENCOUNTER — Encounter: Payer: Self-pay | Admitting: Cardiology

## 2019-03-19 VITALS — BP 163/71 | HR 50 | Ht 71.0 in | Wt 265.2 lb

## 2019-03-19 DIAGNOSIS — I10 Essential (primary) hypertension: Secondary | ICD-10-CM

## 2019-03-19 DIAGNOSIS — I639 Cerebral infarction, unspecified: Secondary | ICD-10-CM | POA: Diagnosis not present

## 2019-03-19 DIAGNOSIS — I5032 Chronic diastolic (congestive) heart failure: Secondary | ICD-10-CM

## 2019-03-19 DIAGNOSIS — I2510 Atherosclerotic heart disease of native coronary artery without angina pectoris: Secondary | ICD-10-CM | POA: Diagnosis not present

## 2019-03-19 DIAGNOSIS — H349 Unspecified retinal vascular occlusion: Secondary | ICD-10-CM

## 2019-03-19 MED ORDER — METOPROLOL TARTRATE 25 MG PO TABS: 25.0000 mg | ORAL_TABLET | Freq: Two times a day (BID) | ORAL | 3 refills | Status: DC

## 2019-03-19 NOTE — Progress Notes (Signed)
Clinical Summary Charles Sherman is a 74 y.o.male seen today as a new patient. He is followed at the [facility] for his primary care and for cardiology.    1. CAD - limited notes available. Prior discharge summary mentions CAD with "PCI x 3 times"  - from [place] notes last PCI 2010 - nuclear stress 10/2016 no ischemia  - no recent chest pain. No SOB/DOE    2.Chronic diastolic HF - [id number] echo LVEF 55-60%, no WMAs, grade I diastolic dysfunction - from [place] echo 07/2015 LVEF "normal with mild DDx).   - no recent edema - diuretic regimen is per his physicians at the [place]   3. COPD   4. Retinal artery occlusion/CVA - Jan 2021 carotid US <50% bilaterally -  5. Bradycardia - no symptoms    [facility] providers: PCP, cardiology Past Medical History:  Diagnosis Date  . CHF (congestive heart failure) ([place])   . COPD (chronic obstructive pulmonary disease) ([place])   . HTN (hypertension)   . Myocardial infarct ([place])   . OSA (obstructive sleep apnea)    on CPAP     Allergies  Allergen Reactions  . Shrimp [Shellfish Allergy]      Current Outpatient Medications  Medication Sig Dispense Refill  . albuterol (VENTOLIN HFA) 108 (90 Base) MCG/ACT inhaler Inhale 2 puffs into the lungs every 6 (six) hours as needed for wheezing or shortness of breath.    Marland Kitchen amLODipine (NORVASC) 10 MG tablet Take 5 mg by mouth daily.    Marland Kitchen aspirin EC 81 MG tablet Take 81 mg by mouth daily.    Marland Kitchen atorvastatin (LIPITOR) 40 MG tablet Take 40 mg by mouth daily.    Marland Kitchen docusate sodium (COLACE) 100 MG capsule Take 100 mg by mouth daily as needed for mild constipation.    . furosemide (LASIX) 80 MG tablet Take 80 mg by mouth 2 (two) times daily.    . hydrochlorothiazide (HYDRODIURIL) 25 MG tablet Take 25 mg by mouth 3 (three) times daily. Do not take if SBP is <100    . isosorbide mononitrate (IMDUR) 120 MG 24 hr tablet Take 120 mg by mouth daily.    . lansoprazole (PREVACID) 15 MG capsule Take 15 mg by mouth daily as needed.     . metolazone (ZAROXOLYN) 2.5 MG tablet Take 2.5 mg by mouth once a week. On Mondays 30 minutes before furosemide. May take twice daily as needed    . metoprolol tartrate (LOPRESSOR) 50 MG tablet Take 50 mg by mouth 2 (two) times daily. Do not take if pulse is < 55 or SBP is < 100    . Multiple Vitamin (MULTIVITAMIN) tablet Take 1 tablet by mouth daily.    . nitroGLYCERIN (NITROSTAT) 0.4 MG SL tablet Place 0.4 mg under the tongue every 5 (five) minutes x 3 doses as needed for chest pain (If no relief after 3 rd dose report to the ED).    Marland Kitchen nystatin cream (MYCOSTATIN) Apply 1 application topically 2 (two) times daily as needed for dry skin. 15 gm    . potassium chloride (KLOR-CON) 10 MEQ tablet Take 20 mEq by mouth 2 (two) times daily. Except take three tablets twice day on Mondays only     No current facility-administered medications for this visit.     Past Surgical History:  Procedure Laterality Date  . PERCUTANEOUS CORONARY STENT INTERVENTION (PCI-S)       Allergies  Allergen Reactions  . Shrimp [Shellfish Allergy]  No family history on file.   Social History Mr. Lundstrom reports that he has been smoking cigarettes. He has been smoking about 0.50 packs per day. He does not have any smokeless tobacco history on file. Mr. Roedl has no history on file for alcohol.   Review of Systems CONSTITUTIONAL: No weight loss, fever, chills, weakness or fatigue.  HEENT: Eyes: +right eye vision lossNo hearing loss, sneezing, congestion, runny nose or sore throat.  SKIN: No rash or itching.  CARDIOVASCULAR: per hpi RESPIRATORY: No shortness of breath, cough or sputum.  GASTROINTESTINAL: No anorexia, nausea, vomiting or diarrhea. No abdominal pain or blood.  GENITOURINARY: No burning on urination, no polyuria NEUROLOGICAL: No headache, dizziness, syncope, paralysis, ataxia, numbness or tingling in the extremities. No change in bowel or bladder control.  MUSCULOSKELETAL: No  muscle, back pain, joint pain or stiffness.  LYMPHATICS: No enlarged nodes. No history of splenectomy.  PSYCHIATRIC: No history of depression or anxiety.  ENDOCRINOLOGIC: No reports of sweating, cold or heat intolerance. No polyuria or polydipsia.  Marland Kitchen   Physical Examination Today's Vitals   03/19/19 1026  BP: (!) 163/71  Pulse: (!) 50  SpO2: 98%  Weight: 265 lb 3.2 oz (120.3 kg)  Height: 5\' 11"  (1.803 m)   Body mass index is 36.99 kg/m.  Gen: resting comfortably, no acute distress HEENT: no scleral icterus, pupils equal round and reactive, no palptable cervical adenopathy,  CVL RRR, 2/6 systolic murmur apex, no jvd Resp: Clear to auscultation bilaterally GI: abdomen is soft, non-tender, non-distended, normal bowel sounds, no hepatosplenomegaly MSK: extremities are warm, no edema.  Skin: warm, no rash Neuro:  no focal deficits Psych: appropriate affect     Assessment and Plan  1. Retinal artery occlusion -reason for referal. We will look for possible cardiac etiologies with echo and 30 day event monitor - recent benign carotid Dopplers - continue ASA, statin  2. CAD - no recent symptoms - stress test 2018 no ischemioa - continue medical therapy  3. Chronic diastolic HF - appears euvolemic - defer diuretic regimen to [place] who have been taking care of him  4. HTN - on recheck 130/70, at goal, continue current meds - nontraditional HCTZ dosing, I defer to his pcp and regular cardiologist.   5. Bradycardia - EKG today shows sinus brady 46 - he is asymptomatic - lower lopressor to 25mg  bid    F/u pending echo and monitor results.   Arnoldo Lenis, M.D

## 2019-03-19 NOTE — Patient Instructions (Addendum)
Medication Instructions:    Your physician has recommended you make the following change in your medication:   Decrease metoprolol tartrate to 25 mg by mouth twice daily. You may break your 50 mg tablets in half twice daily until they are finished.  Continue other medications the same  Labwork:  NONE  Testing/Procedures: Your physician has requested that you have an echocardiogram. Echocardiography is a painless test that uses sound waves to create images of your heart. It provides your doctor with information about the size and shape of your heart and how well your heart's chambers and valves are working. This procedure takes approximately one hour. There are no restrictions for this procedure. Your physician has recommended that you wear an event monitor for 30 days. Event monitors are medical devices that record the heart's electrical activity. Doctors most often [place] these monitors to diagnose arrhythmias. Arrhythmias are problems with the speed or rhythm of the heartbeat. The monitor is a small, portable device. You can wear one while you do your normal daily activities. This is usually used to diagnose what is causing palpitations/syncope (passing out). [employer] will contact you about this monitor.   Follow-Up:  Your physician recommends that you schedule a follow-up appointment in: pending.  Any Other Special Instructions Will Be Listed Below (If Applicable).  If you need a refill on your cardiac medications before your next appointment, please call your pharmacy.

## 2019-03-28 ENCOUNTER — Encounter (INDEPENDENT_AMBULATORY_CARE_PROVIDER_SITE_OTHER): Payer: No Typology Code available for payment source

## 2019-03-28 ENCOUNTER — Other Ambulatory Visit: Payer: Self-pay

## 2019-03-28 ENCOUNTER — Ambulatory Visit (INDEPENDENT_AMBULATORY_CARE_PROVIDER_SITE_OTHER): Payer: No Typology Code available for payment source

## 2019-03-28 DIAGNOSIS — I639 Cerebral infarction, unspecified: Secondary | ICD-10-CM

## 2019-04-11 ENCOUNTER — Telehealth: Payer: Self-pay | Admitting: *Deleted

## 2019-04-11 NOTE — Telephone Encounter (Signed)
Pt voiced understanding - routed to pcp  

## 2019-04-11 NOTE — Telephone Encounter (Signed)
-----   Message from Arnoldo Lenis, MD sent at 04/10/2019 12:38 PM EST ----- Echo overall looks good, normal heart function. We will f/u his heart monitor results. If normal we may do one additional portion of the echocardiogram called a bubble study to verify there is no signs of any abnormal areas of the heart   Zandra Abts MD

## 2019-05-09 ENCOUNTER — Encounter (INDEPENDENT_AMBULATORY_CARE_PROVIDER_SITE_OTHER): Payer: No Typology Code available for payment source | Admitting: Ophthalmology

## 2019-05-24 ENCOUNTER — Emergency Department (HOSPITAL_COMMUNITY)
Admission: EM | Admit: 2019-05-24 | Discharge: 2019-05-24 | Disposition: A | Discharge status: Home / Self Care | Payer: No Typology Code available for payment source | Attending: Emergency Medicine | Admitting: Emergency Medicine

## 2019-05-24 ENCOUNTER — Encounter (HOSPITAL_COMMUNITY): Payer: Self-pay | Admitting: Emergency Medicine

## 2019-05-24 ENCOUNTER — Emergency Department (HOSPITAL_COMMUNITY): Payer: No Typology Code available for payment source

## 2019-05-24 DIAGNOSIS — I110 Hypertensive heart disease with heart failure: Secondary | ICD-10-CM | POA: Diagnosis not present

## 2019-05-24 DIAGNOSIS — D329 Benign neoplasm of meninges, unspecified: Secondary | ICD-10-CM | POA: Diagnosis not present

## 2019-05-24 DIAGNOSIS — R9402 Abnormal brain scan: Secondary | ICD-10-CM | POA: Diagnosis present

## 2019-05-24 DIAGNOSIS — J449 Chronic obstructive pulmonary disease, unspecified: Secondary | ICD-10-CM | POA: Insufficient documentation

## 2019-05-24 DIAGNOSIS — I509 Heart failure, unspecified: Secondary | ICD-10-CM | POA: Insufficient documentation

## 2019-05-24 DIAGNOSIS — F17210 Nicotine dependence, cigarettes, uncomplicated: Secondary | ICD-10-CM | POA: Insufficient documentation

## 2019-05-24 DIAGNOSIS — Z7982 Long term (current) use of aspirin: Secondary | ICD-10-CM | POA: Diagnosis not present

## 2019-05-24 DIAGNOSIS — I252 Old myocardial infarction: Secondary | ICD-10-CM | POA: Diagnosis not present

## 2019-05-24 LAB — CBC WITH DIFFERENTIAL/PLATELET
Abs Immature Granulocytes: 0.03 10*3/uL (ref 0.00–0.07)
Basophils Absolute: 0 10*3/uL (ref 0.0–0.1)
Basophils Relative: 0 %
Eosinophils Absolute: 0.1 10*3/uL (ref 0.0–0.5)
Eosinophils Relative: 2 %
HCT: 40.6 % (ref 39.0–52.0)
Hemoglobin: 13.5 g/dL (ref 13.0–17.0)
Immature Granulocytes: 0 %
Lymphocytes Relative: 23 %
Lymphs Abs: 1.9 10*3/uL (ref 0.7–4.0)
MCH: 32.3 pg (ref 26.0–34.0)
MCHC: 33.3 g/dL (ref 30.0–36.0)
MCV: 97.1 fL (ref 80.0–100.0)
Monocytes Absolute: 0.7 10*3/uL (ref 0.1–1.0)
Monocytes Relative: 8 %
Neutro Abs: 5.5 10*3/uL (ref 1.7–7.7)
Neutrophils Relative %: 67 %
Platelets: 176 10*3/uL (ref 150–400)
RBC: 4.18 MIL/uL — ABNORMAL LOW (ref 4.22–5.81)
RDW: 14.6 % (ref 11.5–15.5)
WBC: 8.3 10*3/uL (ref 4.0–10.5)
nRBC: 0 % (ref 0.0–0.2)

## 2019-05-24 LAB — BASIC METABOLIC PANEL
Anion gap: 6 (ref 5–15)
BUN: 21 mg/dL (ref 8–23)
CO2: 26 mmol/L (ref 22–32)
Calcium: 9.2 mg/dL (ref 8.9–10.3)
Chloride: 110 mmol/L (ref 98–111)
Creatinine, Ser: 1.56 mg/dL — ABNORMAL HIGH (ref 0.61–1.24)
GFR calc Af Amer: 50 mL/min — ABNORMAL LOW (ref >60)
GFR calc non Af Amer: 43 mL/min — ABNORMAL LOW (ref >60)
Glucose, Bld: 129 mg/dL — ABNORMAL HIGH (ref 70–99)
Potassium: 4 mmol/L (ref 3.5–5.1)
Sodium: 142 mmol/L (ref 135–145)

## 2019-05-24 IMAGING — CT CT HEAD W/O CM
4 series · 15 of 47 positions shown, 17 images · non-contrast
Comparison: None.

CLINICAL DATA: Headache.  Possible mass on outside imaging.

EXAM:
CT HEAD WITHOUT CONTRAST
TECHNIQUE: Contiguous axial images were obtained from the base of the skull
through the vertex without intravenous contrast.

[Series 2: head without · axial · non-contrast · 0.46mm/px · z∈[-99,+26]mm · 7 of 35 slices shown, 9 images]
[im 5/35  brain]
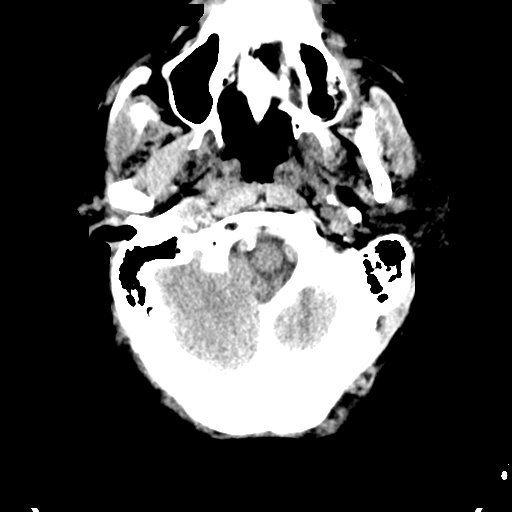
[im 5/35  bone]
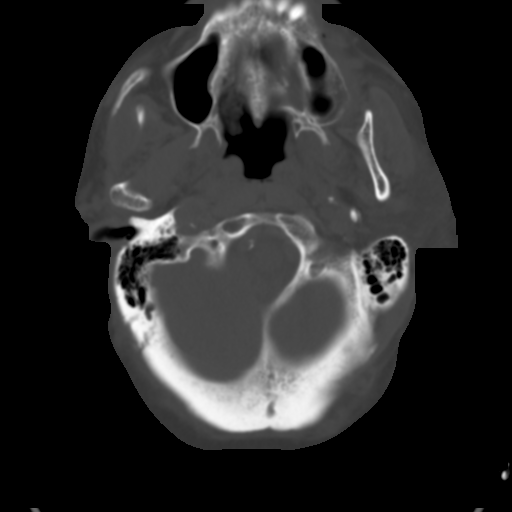
[im 9/35  brain]
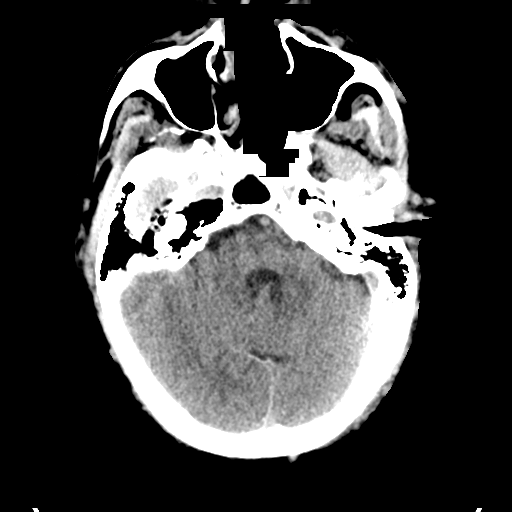
[im 13/35  brain]
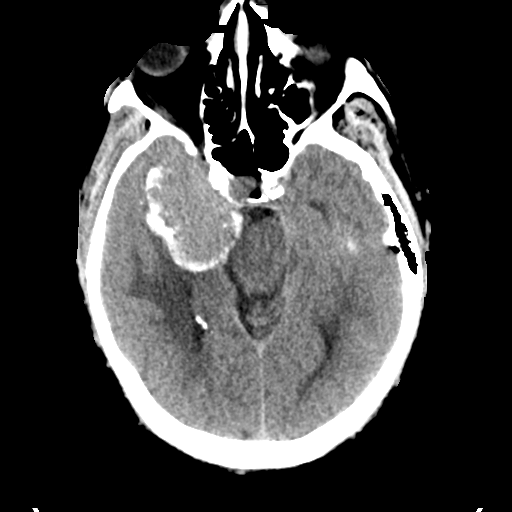
[im 18/35  brain]
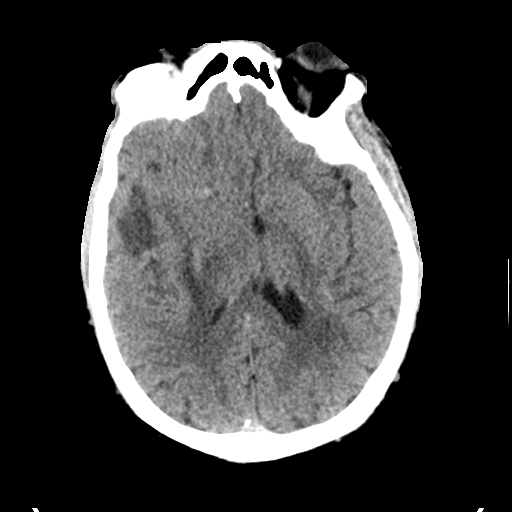
[im 22/35  brain]
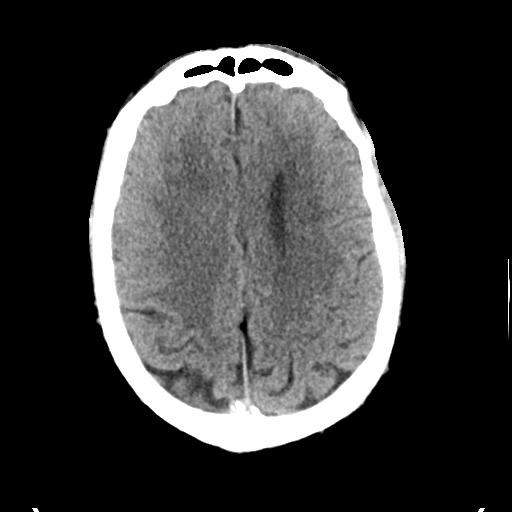
[im 22/35  bone]
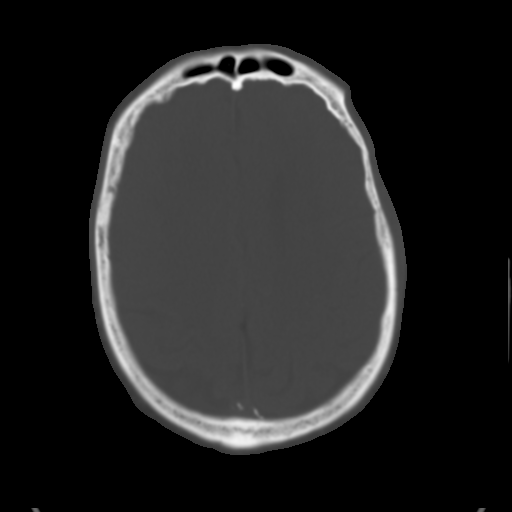
[im 26/35  brain]
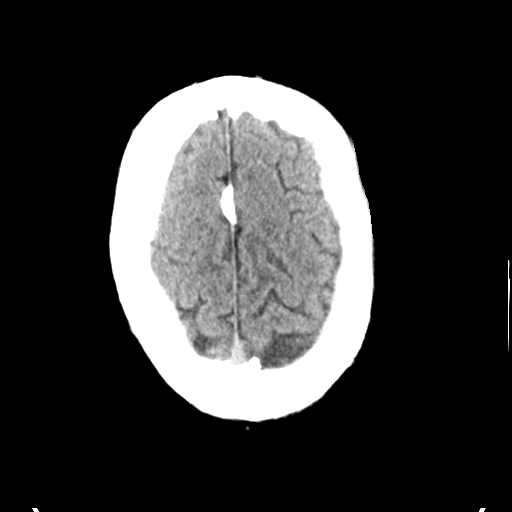
[im 30/35  brain]
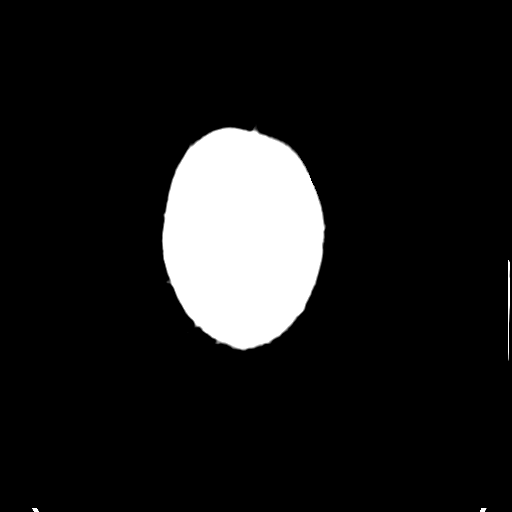

[Series 3: head bone · axial · 0.46mm/px · z∈[-103,-85]mm · 2 of 87 slices shown]
[im 9/87  bone]
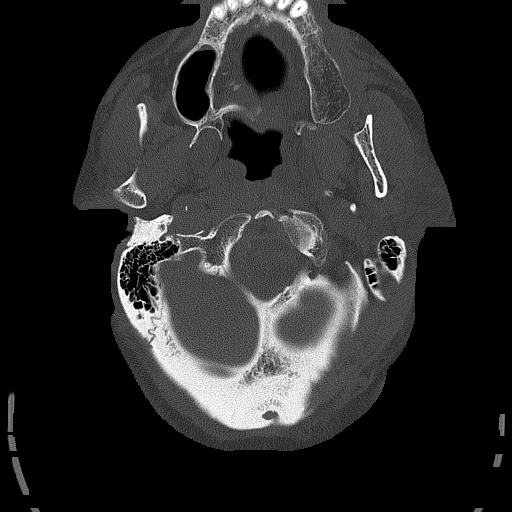
[im 18/87  bone]
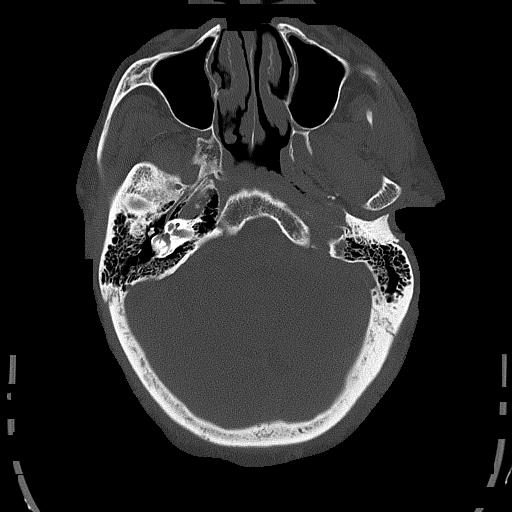

[Series 4: head without cor · coronal · non-contrast · 0.36mm/px · 3 of 78 slices shown]
[im 26/78  brain]
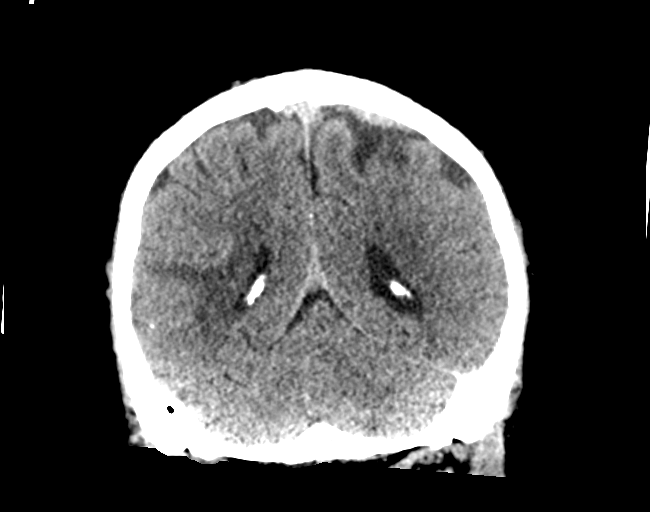
[im 35/78  brain]
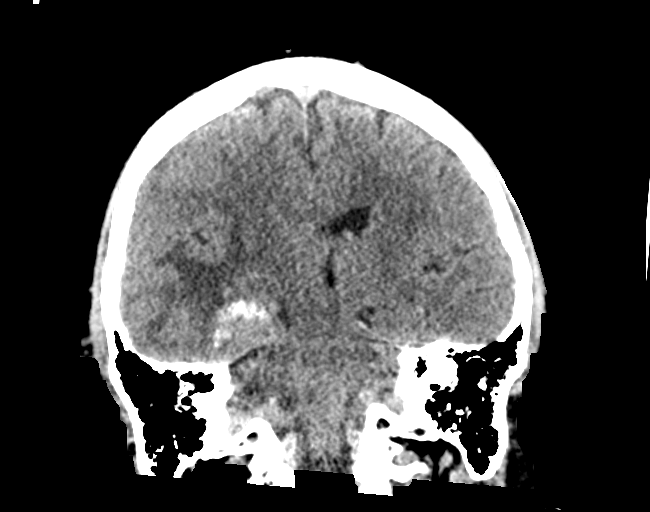
[im 43/78  brain]
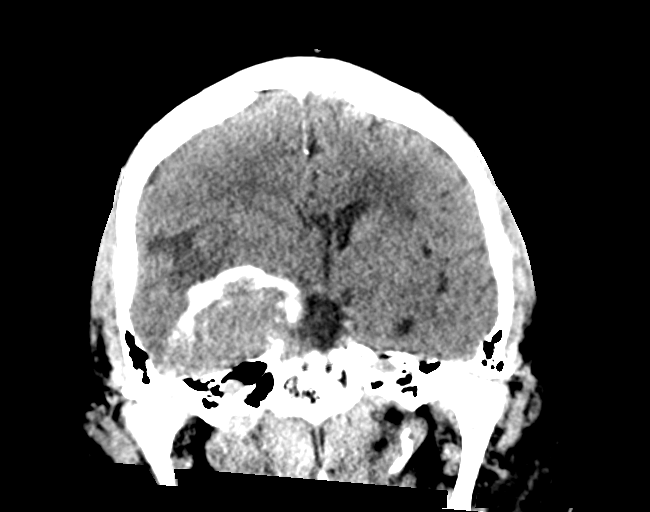

[Series 5: head without sag · sagittal · non-contrast · 0.36mm/px · 3 of 67 slices shown]
[im 26/67  brain]
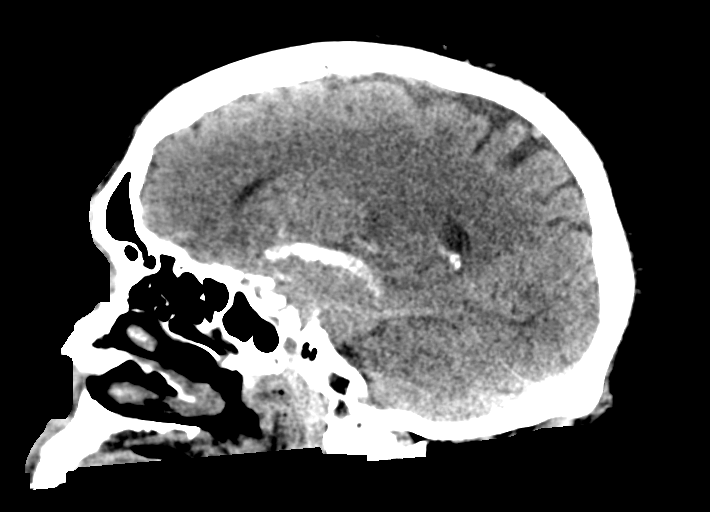
[im 34/67  brain]
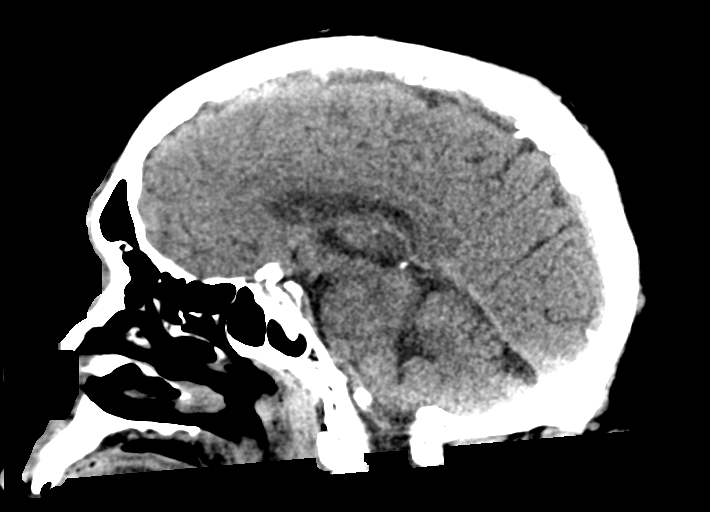
[im 41/67  brain]
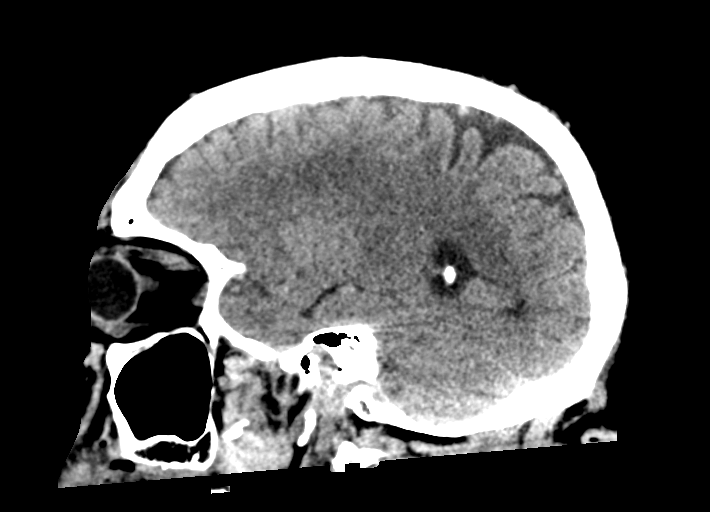

[15 of 47 positions shown; findings below may reference images not displayed]

FINDINGS: Brain: Peripherally calcified meningioma arising from the greater
wing of the sphenoid on the right measuring 5.5 x 5.5 x 4.0 cm.
Mass-effect upon the base of the brain with some vasogenic edema in
the right temporal lobe. Tumor extends over the age of the
tentorium, going along the right side adjacent to the
perimesencephalic cistern on the right. Elsewhere, brain shows mild
age related volume loss with mild small vessel change of the white
matter. No hydrocephalus. No extra-axial fluid collection.

Vascular: There is atherosclerotic calcification of the major
vessels at the base of the brain.

Skull: No primary skull pathology.

Sinuses/Orbits: Sinuses are clear.  Orbits are negative.

Other: None
IMPRESSION: Peripherally calcified meningioma arising from the greater wing of
the sphenoid on the right measuring 5.5 x 5.5 x 4.0 cm. Mass-effect
upon the base of the brain on the right with vasogenic edema in the
right temporal lobe.

## 2019-05-24 NOTE — Consult Note (Signed)
Reason for Consult: Right temporal brain mass Referring Physician: Dr. Rolan Lipa Charles Sherman is an 74 y.o. male.  HPI: Charles Sherman is a 74 year old right-handed individual who had a routine follow-up CT having had an ophthalmic artery occlusion several months ago.  The CT scan demonstrated the presence of a large right temporal mass near the sphenoid wing and he was advised that this should be worked up urgently.  He presented to the [facility] emergency room and a repeat CT scan was obtained as the former images were not available.  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  I have discussed and described the lesion to the patient and his wife and demonstrated the images that we have I noted that ultimately he may need to have this lesion removed but he should first have a MRI of the brain.  Given his good neurologic status I have advised that he can be discharged from the emergency room and he will have an outpatient MRI of the brain scan scheduled with and without gadolinium.  We will then visit in my office and we can discuss the details of the MRI and plan for when and how this lesion should be handled likely will require surgical resection.  They are agreeable with this.  Past Medical History:  Diagnosis Date  . CHF (congestive heart failure) ([place])   . COPD (chronic obstructive pulmonary disease) ([place])   . HTN (hypertension)   . Myocardial infarct ([place])   . OSA (obstructive sleep apnea)    on CPAP    Past Surgical History:  Procedure Laterality Date  . PERCUTANEOUS CORONARY STENT INTERVENTION (PCI-S)      Family History  Problem Relation Age of Onset  . Alzheimer's disease Mother   .  Heart disease Father   . Diabetes Sister   . Stomach cancer Brother     Social History:  reports that he has been smoking cigarettes. He has been smoking about 0.50 packs per day. He has never used smokeless tobacco. No history on file for alcohol and drug.  Allergies:  Allergies  Allergen Reactions  . Shrimp [Shellfish Allergy]     Medications: I have reviewed the patient's current medications.  Results for orders placed or performed during the hospital encounter of 05/24/19 (from the past 48 hour(s))  CBC with Differential     Status: Abnormal   Collection Time: 05/24/19  3:32 PM  Result Value Ref Range   WBC 8.3 4.0 - 10.5 K/uL   RBC 4.18 (L) 4.22 - 5.81 MIL/uL   Hemoglobin 13.5 13.0 - 17.0 g/dL   HCT 40.6 39.0 - 52.0 %   MCV 97.1 80.0 - 100.0 fL   MCH 32.3 26.0 - 34.0 pg   MCHC 33.3 30.0 - 36.0 g/dL   RDW 14.6 11.5 - 15.5 %   Platelets 176 150 - 400 K/uL   nRBC 0.0 0.0 - 0.2 %   Neutrophils Relative % 67 %   Neutro Abs 5.5 1.7 - 7.7 K/uL   Lymphocytes Relative 23 %   Lymphs Abs 1.9 0.7 - 4.0 K/uL   Monocytes Relative 8 %   Monocytes Absolute 0.7 0.1 - 1.0 K/uL  Eosinophils Relative 2 %   Eosinophils Absolute 0.1 0.0 - 0.5 K/uL   Basophils Relative 0 %   Basophils Absolute 0.0 0.0 - 0.1 K/uL   Immature Granulocytes 0 %   Abs Immature Granulocytes 0.03 0.00 - 0.07 K/uL    Comment: Performed at [facility] [address] [place], [place] [id number]  Basic metabolic panel     Status: Abnormal   Collection Time: 05/24/19  3:32 PM  Result Value Ref Range   Sodium 142 135 - 145 mmol/L   Potassium 4.0 3.5 - 5.1 mmol/L   Chloride 110 98 - 111 mmol/L   CO2 26 22 - 32 mmol/L   Glucose, Bld 129 (H) 70 - 99 mg/dL    Comment: Glucose reference range applies only to samples taken after fasting for at least 8 hours.   BUN 21 8 - 23 mg/dL   Creatinine, Ser 1.56 (H) 0.61 - 1.24 mg/dL   Calcium 9.2 8.9 - 10.3 mg/dL   GFR calc non Af Amer 43 (L) >60 mL/min   GFR calc  Af Amer 50 (L) >60 mL/min   Anion gap 6 5 - 15    Comment: Performed at [facility] [address], [place],  [address]    CT Head Wo Contrast  Result Date: 05/24/2019 CLINICAL DATA:  Headache.  Possible mass on outside imaging. EXAM: CT HEAD WITHOUT CONTRAST TECHNIQUE: Contiguous axial images were obtained from the base of the skull through the vertex without intravenous contrast. COMPARISON:  None. FINDINGS: Brain: Peripherally calcified meningioma arising from the greater wing of the sphenoid on the right measuring 5.5 x 5.5 x 4.0 cm. Mass-effect upon the base of the brain with some vasogenic edema in the right temporal lobe. Tumor extends over the age of the tentorium, going along the right side adjacent to the perimesencephalic cistern on the right. Elsewhere, brain shows mild age related volume loss with mild small vessel change of the white matter. No hydrocephalus. No extra-axial fluid collection. Vascular: There is atherosclerotic calcification of the major vessels at the base of the brain. Skull: No primary skull pathology. Sinuses/Orbits: Sinuses are clear.  Orbits are negative. Other: None IMPRESSION: Peripherally calcified meningioma arising from the greater wing of the sphenoid on the right measuring 5.5 x 5.5 x 4.0 cm. Mass-effect upon the base of the brain on the right with vasogenic edema in the right temporal lobe. Electronically Signed   By: Nelson Chimes M.D.   On: 05/24/2019 17:53    Review of Systems  Constitutional: Negative.   HENT: Negative.   Eyes:       Blindness in the right eye  Respiratory: Negative.   Cardiovascular: Negative.   Gastrointestinal: Negative.   Endocrine: Negative.   Genitourinary: Negative.   Musculoskeletal: Negative.   Skin: Negative.   Allergic/Immunologic: Negative.   Neurological: Negative.   Hematological: Negative.   Psychiatric/Behavioral: Negative.    Blood pressure (!) 150/71, pulse (!) 46, temperature 98.3 F (36.8 C),  temperature source Oral, resp. rate 13, height 5\' 11"  (1.803 m), weight 117.9 kg, SpO2 96 %. Physical Exam  Constitutional: He is oriented to person, place, and time. He appears well-developed and well-nourished.  HENT:  Head: Normocephalic and atraumatic.  Eyes:  Right pupil is 3 mm and nonreactive.  Left pupil is 4 mm and briskly reactive extraocular movements are full.  Cardiovascular: Normal rate and regular rhythm.  Respiratory: Effort normal and breath sounds normal.  GI: Soft.  Bowel sounds are normal.  Musculoskeletal:        General: Normal range of motion.     Cervical back: Normal range of motion and neck supple.  Neurological: He is alert and oriented to person, place, and time. He has normal reflexes.  Blindness noted in the right eye  Skin: Skin is warm and dry.  Psychiatric: He has a normal mood and affect. His behavior is normal. Judgment and thought content normal.    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  Modest mass-effect with vasogenic edema  Plan: Outpatient work-up with MRI with gadolinium to be scheduled and then the follow-up visit in my office.  Patient may continue activities as he tolerates.  Blanchie Dessert Malaia Buchta 05/24/2019, 7:34 PM

## 2019-05-24 NOTE — ED Triage Notes (Signed)
Pt states he was told after having a follow up CT today to come to the ED due to a "brain mass". Pt states over 2-3 months ago he had a stroke and lost most of the vision in his right eye. Pt states he only has a small amount of vision on right eye. Pt has no other complaints, denies pain.

## 2019-05-24 NOTE — Discharge Instructions (Signed)
Will need to follow-up and have an MRI outpatient.  I have listed the contact information for the neurosurgeon, Dr. Ellene Route that you saw today.  You will need to have an appointment with him.  If you have any new or worsening symptoms such as vision changes, facial droop, difficulty speaking, unilateral weakness, severe headache please seek reevaluation emergency department

## 2019-05-24 NOTE — ED Provider Notes (Signed)
[facility] EMERGENCY DEPARTMENT Provider Note   CSN: [id number] Arrival date & time: 05/24/19  1414   History Chief Complaint  Patient presents with  . Brain Tumor   BERYL TAVIZON is a 74 y.o. male.  She was seen today for follow-up after having CVA.  Apparently had [facility] in [place] or [place] and was called on the way home and told to come to the emergency department due to possible brain mass.  She states he is having imaging done due to chronic headaches over the last month.  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  Patient denies any complaints.  Denies lightheadedness, dizziness, vision changes, neck pain, neck stiffness, facial droop, unilateral weakness, paresthesias, chest pain, shortness of breath, vomiting, diarrhea, dysuria.  Denies additional aggravating or alleviating factors.  History obtained from patient past medical record and are reviewed.  Collateral from patient's sister Coralyn Helling at [phone] and wife presents in room   HPI     Past Medical History:  Diagnosis Date  . CHF (congestive heart failure) ([place])   . COPD (chronic obstructive pulmonary disease) ([place])   . HTN (hypertension)   . Myocardial infarct ([place])   . OSA (obstructive sleep apnea)    on CPAP    There are no problems to display for this patient.   Past Surgical History:  Procedure Laterality Date  . PERCUTANEOUS CORONARY STENT INTERVENTION (PCI-S)         Family History  Problem Relation Age of Onset  . Alzheimer's disease Mother   . Heart disease Father   . Diabetes Sister   . Stomach cancer Brother     Social History   Tobacco Use  . Smoking status: Current Every Day Smoker    Packs/day: 0.50    Types: Cigarettes  . Smokeless tobacco: Never Used  Substance Use Topics  . Alcohol use: Not on file  . Drug use: Not on file    Home Medications Prior to Admission medications   Medication Sig  Start Date End Date Taking? Authorizing Provider  albuterol (VENTOLIN HFA) 108 (90 Base) MCG/ACT inhaler Inhale 2 puffs into the lungs every 6 (six) hours as needed for wheezing or shortness of breath.   Yes [provider]  amLODipine (NORVASC) 10 MG tablet Take 5 mg by mouth daily.   Yes [provider]  aspirin EC 81 MG tablet Take 81 mg by mouth daily.   Yes [provider]  atorvastatin (LIPITOR) 40 MG tablet Take 40 mg by mouth daily.   Yes [provider]  docusate sodium (COLACE) 100 MG capsule Take 100 mg by mouth daily as needed for mild constipation.   Yes [provider]  furosemide (LASIX) 80 MG tablet Take 80 mg by mouth 2 (two) times daily.   Yes [provider]  hydrochlorothiazide (HYDRODIURIL) 25 MG tablet Take 25 mg by mouth 3 (three) times daily. Do not take if SBP is <100   Yes [provider]  isosorbide mononitrate (IMDUR) 120 MG 24 hr tablet Take 120 mg by mouth daily.   Yes [provider]  lansoprazole (PREVACID) 15 MG capsule Take 15 mg by mouth daily as needed.   Yes [provider]  metolazone (ZAROXOLYN) 2.5 MG tablet Take 2.5 mg by mouth once a week. On Mondays 30 minutes before furosemide. May take twice daily as needed   Yes [provider]  metoprolol tartrate (LOPRESSOR) 25 MG tablet Take 1 tablet (25 mg total) by mouth 2 (two) times daily. 03/19/19 06/17/19 Yes BranchAlphonse Guild, MD  Multiple Vitamin (MULTIVITAMIN) tablet Take 1 tablet by mouth daily.   Yes [provider]  nitroGLYCERIN (NITROSTAT) 0.4 MG SL tablet Place 0.4 mg under the tongue every 5 (five) minutes x 3 doses as needed for chest pain (If no relief after 3 rd dose report to the ED).   Yes [provider]  nystatin cream (MYCOSTATIN) Apply 1 application topically 2 (two) times daily as needed for dry skin. 15 gm   Yes [provider]  potassium chloride (KLOR-CON) 10 MEQ tablet Take 10  mEq by mouth 2 (two) times daily. Except take three tablets twice day on Mondays only    Yes [provider]    Allergies    Shrimp [shellfish allergy]  Review of Systems   Review of Systems  Constitutional: Negative.   HENT: Negative.   Eyes: Positive for visual disturbance.       Chronic right eye blindness  Respiratory: Negative.   Cardiovascular: Negative.   Gastrointestinal: Negative.   Genitourinary: Negative.   Musculoskeletal: Negative.   Skin: Negative.   Neurological: Positive for headaches (Intermittent over last month).  All other systems reviewed and are negative.   Physical Exam Updated Vital Signs BP (!) 150/71   Pulse (!) 46   Temp 98.3 F (36.8 C) (Oral)   Resp 13   Ht 5\' 11"  (1.803 m)   Wt 117.9 kg   SpO2 96%   BMI 36.26 kg/m   Physical Exam Vitals and nursing note reviewed.  Constitutional:      General: He is not in acute distress.    Appearance: He is well-developed. He is not ill-appearing, toxic-appearing or diaphoretic.  HENT:     Head: Atraumatic.     Nose: Nose normal.     Mouth/Throat:     Mouth: Mucous membranes are moist.     Pharynx: Oropharynx is clear.  Eyes:     Pupils: Pupils are equal, round, and reactive to light.  Cardiovascular:     Rate and Rhythm: Regular rhythm. Bradycardia present.     Pulses: Normal pulses.     Heart sounds: Normal heart sounds.     Comments: HR 52 in room Pulmonary:     Effort: Pulmonary effort is normal. No respiratory distress.     Breath sounds: Normal breath sounds.  Abdominal:     General: Bowel sounds are normal. There is no distension.     Palpations: Abdomen is soft.     Comments: Soft non tender without rebound or guarding.  Musculoskeletal:        General: Normal range of motion.     Cervical back: Normal range of motion and neck supple.     Comments: Moves 4 extremities without difficulty.  Compartments soft.  No bony tenderness.  Skin:    General: Skin is warm and dry.      Capillary Refill: Capillary refill takes less than 2 seconds.     Comments: Brisk capillary refill.  Neurological:     Mental Status: He is alert and oriented to person, place, and time. Mental status is at baseline.     Comments: Equal shoulder shrug bilaterally Tongue midline Intact sensation bilateral upper and lower extremities Smile equal Negative Romberg, heel-to-shin Ambulatory from wheelchair to bed without difficulty     ED Results / Procedures /  Treatments   Labs (all labs ordered are listed, but only abnormal results are displayed) Labs Reviewed  CBC WITH DIFFERENTIAL/PLATELET - Abnormal; Notable for the following components:      Result Value   RBC 4.18 (*)    All other components within normal limits  BASIC METABOLIC PANEL - Abnormal; Notable for the following components:   Glucose, Bld 129 (*)    Creatinine, Ser 1.56 (*)    GFR calc non Af Amer 43 (*)    GFR calc Af Amer 50 (*)    All other components within normal limits   EKG EKG Interpretation  Date/Time:  Thursday May 24 2019 15:28:05 EDT Ventricular Rate:  46 PR Interval:    QRS Duration: 83 QT Interval:  494 QTC Calculation: 433 R Axis:   -5 Text Interpretation: Sinus bradycardia Left ventricular hypertrophy Borderline T abnormalities, inferior leads Confirmed by Virgel Manifold [phone]) on 05/24/2019 4:55:59 PM   Radiology CT Head Wo Contrast  Result Date: 05/24/2019 CLINICAL DATA:  Headache.  Possible mass on outside imaging. EXAM: CT HEAD WITHOUT CONTRAST TECHNIQUE: Contiguous axial images were obtained from the base of the skull through the vertex without intravenous contrast. COMPARISON:  None. FINDINGS: Brain: Peripherally calcified meningioma arising from the greater wing of the sphenoid on the right measuring 5.5 x 5.5 x 4.0 cm. Mass-effect upon the base of the brain with some vasogenic edema in the right temporal lobe. Tumor extends over the age of the tentorium, going along the right side  adjacent to the perimesencephalic cistern on the right. Elsewhere, brain shows mild age related volume loss with mild small vessel change of the white matter. No hydrocephalus. No extra-axial fluid collection. Vascular: There is atherosclerotic calcification of the major vessels at the base of the brain. Skull: No primary skull pathology. Sinuses/Orbits: Sinuses are clear.  Orbits are negative. Other: None IMPRESSION: Peripherally calcified meningioma arising from the greater wing of the sphenoid on the right measuring 5.5 x 5.5 x 4.0 cm. Mass-effect upon the base of the brain on the right with vasogenic edema in the right temporal lobe. Electronically Signed   By: Nelson Chimes M.D.   On: 05/24/2019 17:53    Procedures Procedures (including critical care time)  Medications Ordered in ED Medications - No data to display  ED Course  I have reviewed the triage vital signs and the nursing notes.  Pertinent labs & imaging results that were available during my care of the patient were reviewed by me and considered in my medical decision making (see chart for details).  74 year old male presents for evaluation of possible brain tumor.  Seen at outside hospital with questionable mass.  Had acute CVA 3 months ago and has chronic blindness.  Has had intermittent headaches since his stroke.  Head CT scan at [facility] earlier today with concern for mass.  Unfortunately we have tried to contact [facility] multiple times without success.  Patient did not arrive with any copies of imaging or paperwork.  He is at his baseline neurologically per wife in room.  He has no complaints at this time.  Plan on reimaging given we cannot obtain outside imaging at this time.  Labs and imaging personally reviewed interpreted: CBC without leukocytosis, hemoglobin 13.5 Creatinine 1.56, at baseline EKG without STEMI  He is bradycardic here however appears to be at his baseline per his cardiology notes.  He is without any  lightheadedness or dizziness.  CT head with calcified meningioma with mass-effect and  vasogenic edema.  We will plan on consulting with neurosurgery.  1820: CONSULT with Dr. Ellene Route with NS who will evaluate patient. States may possible can just follow up outpatient. Hold on steroids for now  1915: Dr. Ellene Route in assessing patient.  1945: See note from Dr. Ellene Route in chart.  Patient safe to discharge home.  Do not need to start any additional medications at this time.  Patient will follow-up outpatient after MRI.  The patient has been appropriately medically screened and/or stabilized in the ED. I have low suspicion for any other emergent medical condition which would require further screening, evaluation or treatment in the ED or require inpatient management.  Patient is hemodynamically stable and in no acute distress.  Patient able to ambulate in department prior to ED.  Evaluation does not show acute pathology that would require ongoing or additional emergent interventions while in the emergency department or further inpatient treatment.  I have discussed the diagnosis with the patient and answered all questions.  Pain is been managed while in the emergency department and patient has no further complaints prior to discharge.  Patient is comfortable with plan discussed in room and is stable for discharge at this time.  I have discussed strict return precautions for returning to the emergency department.  Patient was encouraged to follow-up with PCP/specialist refer to at discharge.    MDM Rules/Calculators/A&P                       Final Clinical Impression(s) / ED Diagnoses Final diagnoses:  Meningioma [facility])    Rx / DC Orders ED Discharge Orders    None       Adira Limburg A, PA-C 05/24/19 1951    Charlesetta Shanks, MD 05/27/19 [phone]

## 2019-06-15 ENCOUNTER — Telehealth: Payer: Self-pay | Admitting: *Deleted

## 2019-06-15 NOTE — Telephone Encounter (Signed)
-----   Message from Arnoldo Lenis, MD sent at 06/12/2019 12:56 PM EDT ----- Normal heart monitor though data is only available from about 1/4th of the planned time. Did he have troubles wearing the monitor?   Zandra Abts MD

## 2019-06-15 NOTE — Telephone Encounter (Signed)
Pt denies any problems wearing it and says he wore it most all the time - aware of results

## 2019-06-19 NOTE — Telephone Encounter (Signed)
6 weeks f/u   Charles Abts MD

## 2019-06-19 NOTE — Telephone Encounter (Signed)
Scheduled and left pt message of appt

## 2019-08-08 ENCOUNTER — Ambulatory Visit (INDEPENDENT_AMBULATORY_CARE_PROVIDER_SITE_OTHER): Payer: No Typology Code available for payment source | Admitting: Cardiology

## 2019-08-08 ENCOUNTER — Other Ambulatory Visit: Payer: Self-pay

## 2019-08-08 VITALS — BP 132/68 | HR 51 | Ht 71.0 in | Wt 262.4 lb

## 2019-08-08 DIAGNOSIS — I10 Essential (primary) hypertension: Secondary | ICD-10-CM | POA: Diagnosis not present

## 2019-08-08 DIAGNOSIS — I2510 Atherosclerotic heart disease of native coronary artery without angina pectoris: Secondary | ICD-10-CM

## 2019-08-08 DIAGNOSIS — H349 Unspecified retinal vascular occlusion: Secondary | ICD-10-CM

## 2019-08-08 NOTE — Progress Notes (Signed)
Clinical Summary Charles Sherman is a 74 y.o.male seen today for follow up of the following medical problems.    1. CAD - limited notes available. Prior discharge summary mentions CAD with "PCI x 3 times"  - from [place] notes last PCI 2010 - nuclear stress 10/2016 no ischemia  - no recent chest pain. No SOB/DOE    2.Chronic diastolic HF - 09/6576 echo LVEF 55-60%, no WMAs, grade I diastolic dysfunction - from [place] echo 07/2015 LVEF "normal with mild DDx).    - no recent edema.  - compliant with meds   3. COPD   4. Retinal artery occlusion/CVA - Jan 2021 carotid US <50% bilaterally   5. Bradycardia - no symptoms    [facility] providers: PCP, cardiology   Past Medical History:  Diagnosis Date  . CHF (congestive heart failure) ([place])   . COPD (chronic obstructive pulmonary disease) ([place])   . HTN (hypertension)   . Myocardial infarct ([place])   . OSA (obstructive sleep apnea)    on CPAP     Allergies  Allergen Reactions  . Shrimp [Shellfish Allergy]      Current Outpatient Medications  Medication Sig Dispense Refill  . albuterol (VENTOLIN HFA) 108 (90 Base) MCG/ACT inhaler Inhale 2 puffs into the lungs every 6 (six) hours as needed for wheezing or shortness of breath.    Marland Kitchen amLODipine (NORVASC) 10 MG tablet Take 5 mg by mouth daily.    Marland Kitchen aspirin EC 81 MG tablet Take 81 mg by mouth daily.    Marland Kitchen atorvastatin (LIPITOR) 40 MG tablet Take 40 mg by mouth daily.    Marland Kitchen docusate sodium (COLACE) 100 MG capsule Take 100 mg by mouth daily as needed for mild constipation.    . furosemide (LASIX) 80 MG tablet Take 80 mg by mouth 2 (two) times daily.    . hydrochlorothiazide (HYDRODIURIL) 25 MG tablet Take 25 mg by mouth 3 (three) times daily. Do not take if SBP is <100    . isosorbide mononitrate (IMDUR) 120 MG 24 hr tablet Take 120 mg by mouth daily.    . lansoprazole (PREVACID) 15 MG capsule Take 15 mg by mouth daily as needed.    . metolazone (ZAROXOLYN) 2.5 MG tablet  Take 2.5 mg by mouth once a week. On Mondays 30 minutes before furosemide. May take twice daily as needed    . metoprolol tartrate (LOPRESSOR) 25 MG tablet Take 1 tablet (25 mg total) by mouth 2 (two) times daily. 180 tablet 3  . Multiple Vitamin (MULTIVITAMIN) tablet Take 1 tablet by mouth daily.    . nitroGLYCERIN (NITROSTAT) 0.4 MG SL tablet Place 0.4 mg under the tongue every 5 (five) minutes x 3 doses as needed for chest pain (If no relief after 3 rd dose report to the ED).    Marland Kitchen nystatin cream (MYCOSTATIN) Apply 1 application topically 2 (two) times daily as needed for dry skin. 15 gm    . potassium chloride (KLOR-CON) 10 MEQ tablet Take 10 mEq by mouth 2 (two) times daily. Except take three tablets twice day on Mondays only      No current facility-administered medications for this visit.     Past Surgical History:  Procedure Laterality Date  . PERCUTANEOUS CORONARY STENT INTERVENTION (PCI-S)       Allergies  Allergen Reactions  . Shrimp [Shellfish Allergy]       Family History  Problem Relation Age of Onset  . Alzheimer's disease Mother   .  Heart disease Father   . Diabetes Sister   . Stomach cancer Brother      Social History Charles Sherman reports that he has been smoking cigarettes. He has been smoking about 0.50 packs per day. He has never used smokeless tobacco. Charles Sherman has no history on file for alcohol use.   Review of Systems CONSTITUTIONAL: No weight loss, fever, chills, weakness or fatigue.  HEENT: Eyes: No visual loss, blurred vision, double vision or yellow sclerae.No hearing loss, sneezing, congestion, runny nose or sore throat.  SKIN: No rash or itching.  CARDIOVASCULAR: per hpi RESPIRATORY: No shortness of breath, cough or sputum.  GASTROINTESTINAL: No anorexia, nausea, vomiting or diarrhea. No abdominal pain or blood.  GENITOURINARY: No burning on urination, no polyuria NEUROLOGICAL: No headache, dizziness, syncope, paralysis, ataxia,  numbness or tingling in the extremities. No change in bowel or bladder control.  MUSCULOSKELETAL: No muscle, back pain, joint pain or stiffness.  LYMPHATICS: No enlarged nodes. No history of splenectomy.  PSYCHIATRIC: No history of depression or anxiety.  ENDOCRINOLOGIC: No reports of sweating, cold or heat intolerance. No polyuria or polydipsia.  Marland Kitchen   Physical Examination Today's Vitals   08/08/19 1552  BP: 132/68  Pulse: (!) 51  SpO2: 94%  Weight: 262 lb 6.4 oz (119 kg)  Height: 5\' 11"  (1.803 m)   Body mass index is 36.6 kg/m.  Gen: resting comfortably, no acute distress HEENT: no scleral icterus, pupils equal round and reactive, no palptable cervical adenopathy,  CV: RRR, no m/r/g, no jvd Resp: Clear to auscultation bilaterally GI: abdomen is soft, non-tender, non-distended, normal bowel sounds, no hepatosplenomegaly MSK: extremities are warm, no edema.  Skin: warm, no rash Neuro:  no focal deficits Psych: appropriate affect   Diagnostic Studies  03/2019 echo IMPRESSIONS    1. Left ventricular ejection fraction, by estimation, is 65 to 70%. The  left ventricle has normal function. The left ventrical has no regional  wall motion abnormalities. Left ventricular diastolic parameters are  consistent with Grade I diastolic  dysfunction (impaired relaxation).  2. Right ventricular systolic function is normal. The right ventricular  size is normal. Tricuspid regurgitation signal is inadequate for assessing  PA pressure.  3. Left atrial size was severely dilated.  4. The mitral valve is moderately calcified. Trivial mitral valve  regurgitation.  5. The aortic valve is tricuspid. Aortic valve regurgitation is not  visualized.  6. The inferior vena cava is normal in size with greater than 50%  respiratory variability, suggesting right atrial pressure of 3 mmHg.  7. Cannot rule out PFO/ASD. Consider saline contrast study for further  evaluation.     03/2019 heart  monitor  30 day event monitor. Data only available from 27% of planned monitored time  Min HR 42, max HR 99, Avg HR 53  No symptoms reported.  Telemetry tracings show sinus rhythm. No significant arrhythmias.    Assessment and Plan  1. Retinal artery occlusion - recent benign carotid Dopplers - benign 30 day monitor - echo without intracardiac thrombus, potential PFO but seen in 1/4 of patient. At his age even if present data would not support intervention beyond antiplatelet therapy.  - continue ASA, statin  2. CAD - stress test 2018 no ischemioa - no recent symptoms, continue current meds  3. Chronic diastolic HF - no recent symptoms, continue current meds  4. HTN - nontraditional HCTZ dosing, I defer to his pcp and regular cardiologist.  - at goal today    F/u  as needed    Arnoldo Lenis, M.D.

## 2019-08-08 NOTE — Patient Instructions (Signed)
Medication Instructions:  Continue all current medications.  Labwork: none  Testing/Procedures: none  Follow-Up: As needed.    Any Other Special Instructions Will Be Listed Below (If Applicable).  If you need a refill on your cardiac medications before your next appointment, please call your pharmacy.  

## 2019-08-10 ENCOUNTER — Ambulatory Visit: Payer: No Typology Code available for payment source | Admitting: Cardiology

## 2019-08-20 ENCOUNTER — Encounter: Payer: Self-pay | Admitting: Cardiology

## 2019-11-14 ENCOUNTER — Inpatient Hospital Stay (HOSPITAL_COMMUNITY)
Admission: AD | Admit: 2019-11-14 | Discharge: 2019-11-28 | DRG: 034 | Disposition: A | Discharge status: Rehabilitation Facility | Payer: Medicare Other | Source: Other Acute Inpatient Hospital | Attending: Neurological Surgery | Admitting: Neurological Surgery

## 2019-11-14 ENCOUNTER — Encounter (HOSPITAL_COMMUNITY): Payer: Self-pay | Admitting: Neurological Surgery

## 2019-11-14 ENCOUNTER — Inpatient Hospital Stay (HOSPITAL_COMMUNITY): Payer: Medicare Other

## 2019-11-14 DIAGNOSIS — G8194 Hemiplegia, unspecified affecting left nondominant side: Secondary | ICD-10-CM | POA: Diagnosis present

## 2019-11-14 DIAGNOSIS — D72829 Elevated white blood cell count, unspecified: Secondary | ICD-10-CM | POA: Diagnosis present

## 2019-11-14 DIAGNOSIS — E860 Dehydration: Secondary | ICD-10-CM

## 2019-11-14 DIAGNOSIS — G936 Cerebral edema: Secondary | ICD-10-CM | POA: Diagnosis present

## 2019-11-14 DIAGNOSIS — D696 Thrombocytopenia, unspecified: Secondary | ICD-10-CM | POA: Diagnosis not present

## 2019-11-14 DIAGNOSIS — I6521 Occlusion and stenosis of right carotid artery: Secondary | ICD-10-CM | POA: Diagnosis present

## 2019-11-14 DIAGNOSIS — H3410 Central retinal artery occlusion, unspecified eye: Secondary | ICD-10-CM | POA: Diagnosis present

## 2019-11-14 DIAGNOSIS — D320 Benign neoplasm of cerebral meninges: Secondary | ICD-10-CM | POA: Diagnosis present

## 2019-11-14 DIAGNOSIS — Z6836 Body mass index (BMI) 36.0-36.9, adult: Secondary | ICD-10-CM | POA: Diagnosis not present

## 2019-11-14 DIAGNOSIS — K5901 Slow transit constipation: Secondary | ICD-10-CM | POA: Diagnosis not present

## 2019-11-14 DIAGNOSIS — I252 Old myocardial infarction: Secondary | ICD-10-CM

## 2019-11-14 DIAGNOSIS — H5461 Unqualified visual loss, right eye, normal vision left eye: Secondary | ICD-10-CM | POA: Diagnosis present

## 2019-11-14 DIAGNOSIS — I6330 Cerebral infarction due to thrombosis of unspecified cerebral artery: Secondary | ICD-10-CM | POA: Insufficient documentation

## 2019-11-14 DIAGNOSIS — I110 Hypertensive heart disease with heart failure: Secondary | ICD-10-CM | POA: Diagnosis present

## 2019-11-14 DIAGNOSIS — Z8249 Family history of ischemic heart disease and other diseases of the circulatory system: Secondary | ICD-10-CM

## 2019-11-14 DIAGNOSIS — R471 Dysarthria and anarthria: Secondary | ICD-10-CM | POA: Diagnosis present

## 2019-11-14 DIAGNOSIS — N179 Acute kidney failure, unspecified: Secondary | ICD-10-CM | POA: Diagnosis not present

## 2019-11-14 DIAGNOSIS — I63411 Cerebral infarction due to embolism of right middle cerebral artery: Secondary | ICD-10-CM | POA: Diagnosis present

## 2019-11-14 DIAGNOSIS — I69322 Dysarthria following cerebral infarction: Secondary | ICD-10-CM | POA: Diagnosis not present

## 2019-11-14 DIAGNOSIS — G459 Transient cerebral ischemic attack, unspecified: Secondary | ICD-10-CM | POA: Diagnosis not present

## 2019-11-14 DIAGNOSIS — Z79899 Other long term (current) drug therapy: Secondary | ICD-10-CM

## 2019-11-14 DIAGNOSIS — I639 Cerebral infarction, unspecified: Secondary | ICD-10-CM

## 2019-11-14 DIAGNOSIS — I63511 Cerebral infarction due to unspecified occlusion or stenosis of right middle cerebral artery: Secondary | ICD-10-CM | POA: Diagnosis not present

## 2019-11-14 DIAGNOSIS — Z91013 Allergy to seafood: Secondary | ICD-10-CM

## 2019-11-14 DIAGNOSIS — D329 Benign neoplasm of meninges, unspecified: Secondary | ICD-10-CM | POA: Diagnosis present

## 2019-11-14 DIAGNOSIS — I5032 Chronic diastolic (congestive) heart failure: Secondary | ICD-10-CM | POA: Diagnosis present

## 2019-11-14 DIAGNOSIS — J449 Chronic obstructive pulmonary disease, unspecified: Secondary | ICD-10-CM | POA: Diagnosis present

## 2019-11-14 DIAGNOSIS — I69391 Dysphagia following cerebral infarction: Secondary | ICD-10-CM | POA: Diagnosis not present

## 2019-11-14 DIAGNOSIS — I2510 Atherosclerotic heart disease of native coronary artery without angina pectoris: Secondary | ICD-10-CM | POA: Diagnosis present

## 2019-11-14 DIAGNOSIS — Z955 Presence of coronary angioplasty implant and graft: Secondary | ICD-10-CM

## 2019-11-14 DIAGNOSIS — I69354 Hemiplegia and hemiparesis following cerebral infarction affecting left non-dominant side: Secondary | ICD-10-CM | POA: Diagnosis not present

## 2019-11-14 DIAGNOSIS — R29810 Facial weakness: Secondary | ICD-10-CM | POA: Diagnosis present

## 2019-11-14 DIAGNOSIS — E669 Obesity, unspecified: Secondary | ICD-10-CM | POA: Diagnosis present

## 2019-11-14 DIAGNOSIS — E785 Hyperlipidemia, unspecified: Secondary | ICD-10-CM | POA: Diagnosis present

## 2019-11-14 DIAGNOSIS — I10 Essential (primary) hypertension: Secondary | ICD-10-CM | POA: Diagnosis not present

## 2019-11-14 DIAGNOSIS — Z20822 Contact with and (suspected) exposure to covid-19: Secondary | ICD-10-CM | POA: Diagnosis present

## 2019-11-14 DIAGNOSIS — I161 Hypertensive emergency: Secondary | ICD-10-CM | POA: Diagnosis present

## 2019-11-14 DIAGNOSIS — F17210 Nicotine dependence, cigarettes, uncomplicated: Secondary | ICD-10-CM | POA: Diagnosis present

## 2019-11-14 DIAGNOSIS — G4733 Obstructive sleep apnea (adult) (pediatric): Secondary | ICD-10-CM | POA: Diagnosis present

## 2019-11-14 DIAGNOSIS — I130 Hypertensive heart and chronic kidney disease with heart failure and stage 1 through stage 4 chronic kidney disease, or unspecified chronic kidney disease: Secondary | ICD-10-CM | POA: Diagnosis not present

## 2019-11-14 DIAGNOSIS — I6389 Other cerebral infarction: Secondary | ICD-10-CM | POA: Diagnosis not present

## 2019-11-14 DIAGNOSIS — Z7982 Long term (current) use of aspirin: Secondary | ICD-10-CM

## 2019-11-14 LAB — SURGICAL PCR SCREEN
MRSA, PCR: NEGATIVE
Staphylococcus aureus: NEGATIVE

## 2019-11-14 IMAGING — CT CT ANGIO HEAD
1 of 11 series · 3 of 33 positions shown · IV contrast (Omni 300)
Comparison: MRI head [DATE]

CLINICAL DATA: Acute neuro deficit. Rule out stroke. Intracranial
mass lesion.

EXAM:
CT ANGIOGRAPHY HEAD
TECHNIQUE: Multidetector CT imaging of the head was performed using the
standard protocol during bolus administration of intravenous
contrast. Multiplanar CT image reconstructions and MIPs were
obtained to evaluate the vascular anatomy.
CONTRAST:  75mL OMNIPAQUE IOHEXOL 350 MG/ML SOLN

[Series 6: cow 2.0 · axial · 0.46mm/px · z∈[-278,-102]mm · 3 of 89 slices shown]
[im 1/89  soft-tissue]
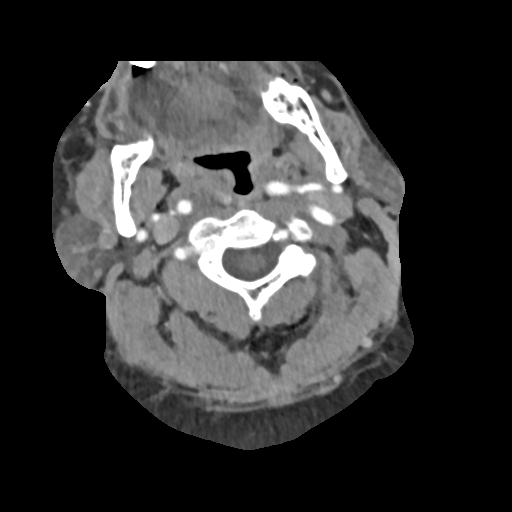
[im 45/89  bone]
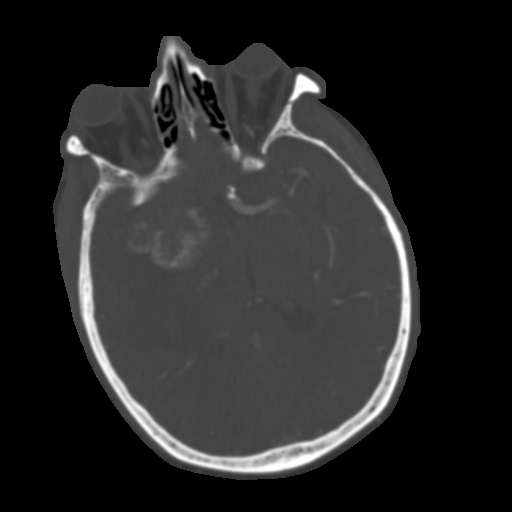
[im 89/89  soft-tissue]
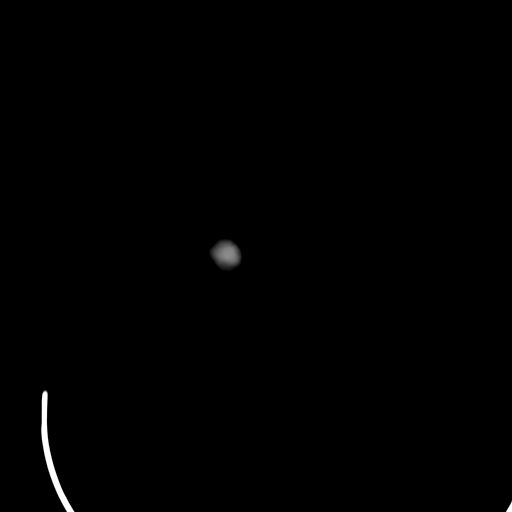

[3 of 33 positions shown; findings below may reference images not displayed]

FINDINGS: CT HEAD

Brain: Large mass arising from the right cavernous sinus with
peripheral calcification. This showed enhancement on MRI and is most
consistent with a large meningioma. There is extension into the
prepontine cistern on the right with mild mass-effect on the ventral
pons on the right. There is surrounding white matter edema and
mass-effect. 7 mm midline shift to the left unchanged. Tumor extends
into the sella.

Negative for acute infarct or hemorrhage. Patchy white matter
hypodensity bilaterally most consistent with chronic microvascular
ischemia as well as vasogenic edema from the tumor on the right.

Vascular: Atherosclerotic calcification in the distal vertebral and
cavernous carotid arteries bilaterally. Negative for hyperdense
vessel

Skull: Negative

Sinuses: Mild mucosal edema paranasal sinuses.

Orbits: Bilateral proptosis.  No orbital mass.

CTA HEAD

Anterior circulation: Heavy calcification throughout the cavernous
carotid bilaterally with mild stenosis bilaterally. Right cavernous
sinus meningioma abuts the supraclinoid internal carotid artery
without encasement or narrowing. Right middle cerebral artery is up
lifted by tumor. Anterior and middle cerebral arteries patent
bilaterally without stenosis or large vessel occlusion.

Posterior circulation: Atherosclerotic calcification and mild
stenosis distal vertebral artery bilaterally. MRI abnormality distal
left vertebral artery corresponding to calcified plaque. No thrombus
or filling defect. PICA patent bilaterally. Basilar is patent
bilaterally. AICA patent bilaterally. The mass abuts the superior
cerebellar artery on the right without occlusion. Posterior cerebral
arteries patent bilaterally without occlusion or stenosis.

Venous sinuses: Normal venous enhancement.

Anatomic variants: None
IMPRESSION: 1. Large meningioma right cavernous sinus extending into the
prepontine cistern on the right. Mass enters the sella.
2. Extensive atherosclerotic calcification in the cavernous carotid
bilaterally with mild stenosis. The tumor abuts the right cavernous
and supraclinoid internal carotid artery without encasement or
occlusion.
3. Mild calcific stenosis in the distal vertebral artery
bilaterally. No thrombus identified in the left vertebral artery as
question on MRI.

## 2019-11-14 MED ORDER — FUROSEMIDE 40 MG PO TABS: 80.0000 mg | ORAL_TABLET | Freq: Two times a day (BID) | ORAL | Status: DC

## 2019-11-14 MED ORDER — NITROGLYCERIN 0.4 MG SL SUBL: 0.4000 mg | SUBLINGUAL_TABLET | SUBLINGUAL | Status: DC | PRN

## 2019-11-14 MED ORDER — METOPROLOL TARTRATE 5 MG/5ML IV SOLN
5.0000 mg | Freq: Four times a day (QID) | INTRAVENOUS | Status: DC | PRN
  Administered 2019-11-14: 5 mg via INTRAVENOUS
  Filled 2019-11-14: qty 5

## 2019-11-14 MED ORDER — ATORVASTATIN CALCIUM 40 MG PO TABS: 40.0000 mg | ORAL_TABLET | Freq: Every day | ORAL | Status: DC

## 2019-11-14 MED ORDER — SODIUM CHLORIDE 0.9 % IV SOLN: INTRAVENOUS | Status: DC

## 2019-11-14 MED ORDER — HYDROCHLOROTHIAZIDE 25 MG PO TABS: 25.0000 mg | ORAL_TABLET | Freq: Three times a day (TID) | ORAL | Status: DC

## 2019-11-14 MED ORDER — POTASSIUM CHLORIDE ER 10 MEQ PO TBCR: 10.0000 meq | EXTENDED_RELEASE_TABLET | Freq: Two times a day (BID) | ORAL | Status: DC

## 2019-11-14 MED ORDER — DOCUSATE SODIUM 100 MG PO CAPS
100.0000 mg | ORAL_CAPSULE | Freq: Every day | ORAL | Status: DC | PRN
  Administered 2019-11-15 – 2019-11-25 (×2): 100 mg via ORAL
  Filled 2019-11-14 (×2): qty 1

## 2019-11-14 MED ORDER — ALBUTEROL SULFATE HFA 108 (90 BASE) MCG/ACT IN AERS
2.0000 | INHALATION_SPRAY | Freq: Four times a day (QID) | RESPIRATORY_TRACT | Status: DC | PRN
  Filled 2019-11-14: qty 6.7

## 2019-11-14 MED ORDER — ONE-DAILY MULTI VITAMINS PO TABS: 1.0000 | ORAL_TABLET | Freq: Every day | ORAL | Status: DC

## 2019-11-14 MED ORDER — PANTOPRAZOLE SODIUM 20 MG PO TBEC: 20.0000 mg | DELAYED_RELEASE_TABLET | Freq: Every day | ORAL | Status: DC

## 2019-11-14 MED ORDER — ONDANSETRON HCL 4 MG/2ML IJ SOLN
4.0000 mg | Freq: Four times a day (QID) | INTRAMUSCULAR | Status: DC | PRN
  Administered 2019-11-15 – 2019-11-16 (×3): 4 mg via INTRAVENOUS
  Filled 2019-11-14 (×3): qty 2

## 2019-11-14 MED ORDER — METOPROLOL TARTRATE 25 MG PO TABS: 25.0000 mg | ORAL_TABLET | Freq: Two times a day (BID) | ORAL | Status: DC

## 2019-11-14 MED ORDER — IOHEXOL 350 MG/ML SOLN
75.0000 mL | Freq: Once | INTRAVENOUS | Status: AC | PRN
  Administered 2019-11-14: 75 mL via INTRAVENOUS

## 2019-11-14 MED ORDER — AMLODIPINE BESYLATE 5 MG PO TABS: 5.0000 mg | ORAL_TABLET | Freq: Every day | ORAL | Status: DC

## 2019-11-14 MED ORDER — DEXAMETHASONE SODIUM PHOSPHATE 4 MG/ML IJ SOLN
4.0000 mg | Freq: Four times a day (QID) | INTRAMUSCULAR | Status: DC
  Administered 2019-11-14 – 2019-11-23 (×31): 4 mg via INTRAVENOUS
  Filled 2019-11-14 (×31): qty 1

## 2019-11-14 MED ORDER — CHLORHEXIDINE GLUCONATE CLOTH 2 % EX PADS
6.0000 | MEDICATED_PAD | Freq: Every day | CUTANEOUS | Status: DC
  Administered 2019-11-16 – 2019-11-28 (×9): 6 via TOPICAL

## 2019-11-14 MED ORDER — METOLAZONE 2.5 MG PO TABS: 2.5000 mg | ORAL_TABLET | ORAL | Status: DC

## 2019-11-14 MED ORDER — ISOSORBIDE MONONITRATE ER 30 MG PO TB24: 120.0000 mg | ORAL_TABLET | Freq: Every day | ORAL | Status: DC

## 2019-11-14 NOTE — Progress Notes (Signed)
Lindzen of Neurology paged. Patient's nausea discussed, new orders for zofran IV 4 mg every 6 hours. Also discussed with Lindzen patient's blood pressure above 160 despite PRN metoprolol given. Lindzen said he feels like SBP goal could be 180 but to ask neurosurgery. Neurosurgery paged about SBP goal. NP Meyran said to make SBP goal 180. Will continue to monitor.

## 2019-11-14 NOTE — H&P (Signed)
Charles Sherman is an 74 y.o. male.   Chief Complaint: Left-sided weakness HPI: 74 year old gentleman with known right medial sphenoid wing meningioma scheduled for routine outpatient follow-up after an MRI scan on the 15th.  However patient presented to the outside hospital emergency room with left-sided weakness last night work-up is revealed multiple acute infarcts right MCA distribution.  Patient has been transferred here for evaluation.  Past Medical History:  Diagnosis Date  . CHF (congestive heart failure) ([place])   . COPD (chronic obstructive pulmonary disease) ([place])   . HTN (hypertension)   . Myocardial infarct ([place])   . OSA (obstructive sleep apnea)    on CPAP    Past Surgical History:  Procedure Laterality Date  . PERCUTANEOUS CORONARY STENT INTERVENTION (PCI-S)      Family History  Problem Relation Age of Onset  . Alzheimer's disease Mother   . Heart disease Father   . Diabetes Sister   . Stomach cancer Brother    Social History:  reports that he has been smoking cigarettes. He has been smoking about 0.50 packs per day. He has never used smokeless tobacco. No history on file for alcohol use and drug use.  Allergies:  Allergies  Allergen Reactions  . Shrimp [Shellfish Allergy]     Medications Prior to Admission  Medication Sig Dispense Refill  . albuterol (VENTOLIN HFA) 108 (90 Base) MCG/ACT inhaler Inhale 2 puffs into the lungs every 6 (six) hours as needed for wheezing or shortness of breath.    Marland Kitchen amLODipine (NORVASC) 10 MG tablet Take 5 mg by mouth daily.    Marland Kitchen aspirin EC 81 MG tablet Take 81 mg by mouth daily.    Marland Kitchen atorvastatin (LIPITOR) 40 MG tablet Take 40 mg by mouth daily.    Marland Kitchen docusate sodium (COLACE) 100 MG capsule Take 100 mg by mouth daily as needed for mild constipation.    . furosemide (LASIX) 80 MG tablet Take 80 mg by mouth 2 (two) times daily.    . hydrochlorothiazide (HYDRODIURIL) 25 MG tablet Take 25 mg by mouth 3 (three) times daily. Do not  take if SBP is <100    . isosorbide mononitrate (IMDUR) 120 MG 24 hr tablet Take 120 mg by mouth daily.    . lansoprazole (PREVACID) 15 MG capsule Take 15 mg by mouth daily as needed.    . metolazone (ZAROXOLYN) 2.5 MG tablet Take 2.5 mg by mouth once a week. On Mondays 30 minutes before furosemide. May take twice daily as needed    . metoprolol tartrate (LOPRESSOR) 25 MG tablet Take 1 tablet (25 mg total) by mouth 2 (two) times daily. 180 tablet 3  . Multiple Vitamin (MULTIVITAMIN) tablet Take 1 tablet by mouth daily.    . nitroGLYCERIN (NITROSTAT) 0.4 MG SL tablet Place 0.4 mg under the tongue every 5 (five) minutes x 3 doses as needed for chest pain (If no relief after 3 rd dose report to the ED).    Marland Kitchen nystatin cream (MYCOSTATIN) Apply 1 application topically 2 (two) times daily as needed for dry skin. 15 gm    . potassium chloride (KLOR-CON) 10 MEQ tablet Take 10 mEq by mouth 2 (two) times daily. Except take three tablets twice day on Mondays only       No results found for this or any previous visit (from the past 48 hour(s)). No results found.  Review of Systems  Unable to perform ROS: Acuity of condition    There were no vitals taken  for this visit. Physical Exam HENT:     Head: Normocephalic.     Nose: Nose normal.     Mouth/Throat:     Mouth: Mucous membranes are moist.  Eyes:     Pupils: Pupils are equal, round, and reactive to light.  Cardiovascular:     Rate and Rhythm: Normal rate.  Pulmonary:     Effort: Pulmonary effort is normal.  Abdominal:     General: Abdomen is flat.  Musculoskeletal:        General: Normal range of motion.  Skin:    General: Skin is warm.  Neurological:     Mental Status: He is alert.     Comments: Patient is awake and alert arousable right gaze preference pupils are equal extract movements appear to be intact with stimulation he is got a left hemiparesis 1-2 out of 5 in his left upper extremity 4-5 antigravity left lower extremity right  side 5 out of 5 arm and leg.  Tongue is midline central seventh  Psychiatric:        Mood and Affect: Mood normal.      Assessment/Plan 74 year old with known right sphenoid wing meningioma with new acute infarcts.  We have consulted the stroke service it is okay with [place] for anticoagulation if the neurology service feels it is indicated.  No acute surgical intervention will be recommended or pursued.  I have also discussed with our neuro interventional list and there is currently no indication for arteriography or stenting.  We will obtain a CT angio this evening  Elaina Hoops, MD 11/14/2019, 8:10 PM

## 2019-11-14 NOTE — Consult Note (Signed)
Referring Physician: Dr. Ellene Route    Chief Complaint: Early subacute right MCA stroke  HPI: Charles Sherman is an 74 y.o. male with a PMHx of COPD, CHF, HTN, MI and OSA, who presented to [facility] on Wednesday with acute onset of left facial droop, left sided weakness and dysarthria.   He was in his USOH until April of this year, when he had sudden onset of vision loss involving almost the entire visual field of his right eye. As part of the work up for this, a CT head was obtained, which revealed a right sphenoid wing meningioma. He was referred to Neurosurgery and decision was made for regular follow ups including imaging to assess for stability versus progression of the meningioma. Repeat MRI was obtained on the 15th of this month, with patient to have had follow up appointment to go over the results with the Neurosurgeon this week. However, yesterday he had acute onset of left sided weakness and facial droop. He was taken to [facility], where MRI brain on 9/22 revealed an acute right MCA stroke. He was transferred to [facility] for possible neurosurgical intervention.   Home medications include ASA and atorvastatin .  LSN: Tuesday tPA Given: No: Intracranial mass.   CT head without contrast, 05/24/19: Peripherally calcified meningioma arising from the greater wing of the sphenoid on the right measuring 5.5 x 5.5 x 4.0 cm. Mass-effect upon the base of the brain on the right with vasogenic edema in the right temporal lobe.  MRI brain at [facility], 11/14/19:    Past Medical History:  Diagnosis Date  . CHF (congestive heart failure) ([place])   . COPD (chronic obstructive pulmonary disease) ([place])   . HTN (hypertension)   . Myocardial infarct ([place])   . OSA (obstructive sleep apnea)    on CPAP    Past Surgical History:  Procedure Laterality Date  . PERCUTANEOUS CORONARY STENT INTERVENTION (PCI-S)      Family History  Problem Relation Age of Onset  . Alzheimer's disease  Mother   . Heart disease Father   . Diabetes Sister   . Stomach cancer Brother    Social History:  reports that he has been smoking cigarettes. He has been smoking about 0.50 packs per day. He has never used smokeless tobacco. No history on file for alcohol use and drug use.  Allergies:  Allergies  Allergen Reactions  . Shrimp [Shellfish Allergy]     Medications:  Prior to Admission:  Medications Prior to Admission  Medication Sig Dispense Refill Last Dose  . albuterol (VENTOLIN HFA) 108 (90 Base) MCG/ACT inhaler Inhale 2 puffs into the lungs every 6 (six) hours as needed for wheezing or shortness of breath.     Marland Kitchen amLODipine (NORVASC) 10 MG tablet Take 5 mg by mouth daily.     Marland Kitchen aspirin EC 81 MG tablet Take 81 mg by mouth daily.     Marland Kitchen atorvastatin (LIPITOR) 40 MG tablet Take 40 mg by mouth daily.     Marland Kitchen docusate sodium (COLACE) 100 MG capsule Take 100 mg by mouth daily as needed for mild constipation.     . furosemide (LASIX) 80 MG tablet Take 80 mg by mouth 2 (two) times daily.     . hydrochlorothiazide (HYDRODIURIL) 25 MG tablet Take 25 mg by mouth 3 (three) times daily. Do not take if SBP is <100     . isosorbide mononitrate (IMDUR) 120 MG 24 hr tablet Take 120 mg by mouth daily.     Marland Kitchen  lansoprazole (PREVACID) 15 MG capsule Take 15 mg by mouth daily as needed.     . metolazone (ZAROXOLYN) 2.5 MG tablet Take 2.5 mg by mouth once a week. On Mondays 30 minutes before furosemide. May take twice daily as needed     . metoprolol tartrate (LOPRESSOR) 25 MG tablet Take 1 tablet (25 mg total) by mouth 2 (two) times daily. 180 tablet 3   . Multiple Vitamin (MULTIVITAMIN) tablet Take 1 tablet by mouth daily.     . nitroGLYCERIN (NITROSTAT) 0.4 MG SL tablet Place 0.4 mg under the tongue every 5 (five) minutes x 3 doses as needed for chest pain (If no relief after 3 rd dose report to the ED).     Marland Kitchen nystatin cream (MYCOSTATIN) Apply 1 application topically 2 (two) times daily as needed for dry skin.  15 gm     . potassium chloride (KLOR-CON) 10 MEQ tablet Take 10 mEq by mouth 2 (two) times daily. Except take three tablets twice day on Mondays only       Scheduled: . amLODipine  5 mg Oral Daily  . atorvastatin  40 mg Oral Daily  . Chlorhexidine Gluconate Cloth  6 each Topical Daily  . dexamethasone (DECADRON) injection  4 mg Intravenous Q6H  . furosemide  80 mg Oral BID  . hydrochlorothiazide  25 mg Oral TID  . isosorbide mononitrate  120 mg Oral Daily  . metolazone  2.5 mg Oral Weekly  . metoprolol tartrate  25 mg Oral BID  . multivitamin  1 tablet Oral Daily  . pantoprazole  20 mg Oral Daily  . potassium chloride  10 mEq Oral BID   Continuous: . sodium chloride 50 mL/hr at 11/14/19 2300    ROS: As per HPI. The patient has no other symptoms.   Physical Examination: There were no vitals taken for this visit.  HEENT: [place]/AT. Right scleral injection.  Lungs: Respirations unlabored Ext: Warm and well perfused  Neurologic Examination: Mental Status: Alert, oriented x 5, thought content appropriate.  Speech is fluent without evidence of aphasia.  Able to follow all right-sided commands without difficulty. Mild dysarthria. Left hemineglect is noted.  Cranial Nerves: II:  Chronically impaired vision in right eye. Inconsistent responses to visual stimuli in left hemifield of left eye. Has difficulty visually fixating. Left pupil 3 mm and reactive, right pupil 4 mm and sluggishly reactive. Right eye with photosensitivity.  III,IV, VI: Right ptosis. Unable to adduct right eye past the midline, but able to abduct. Left EOM are full V,VII: Left facial droop. Temp sensation equal bilaterally.  VIII: hearing intact to voice IX,X: Mild pharyngeal dysarthria.  XI: Head preferentially rotated to the right.  XII: Tongue deviates to the left.  Motor: RUE 5/5 RLE 5/5 LUE with increased flexor tone. 1/5 adduction at shoulder, otherwise 0/5 LLE 2-3/5 Sensory: Temp sensation equal to BUE and  BLE. Severely impaired FT sensation to LUE. FT intact to BLE. Positive for extinction on the left to DSS BLE.  Deep Tendon Reflexes:  Reflexes are brisker on the left.  Plantars: Right: downgoing   Left: upgoing Cerebellar: No ataxia with FNF on the right. Unable to perform on the left.  Gait: Deferred   Echocardiogram (03/28/19): 1. Left ventricular ejection fraction, by estimation, is 65 to 70%. The  left ventricle has normal function. The left ventrical has no regional  wall motion abnormalities. Left ventricular diastolic parameters are  consistent with Grade I diastolic dysfunction (impaired relaxation).  2. Right ventricular  systolic function is normal. The right ventricular  size is normal. Tricuspid regurgitation signal is inadequate for assessing  PA pressure.  3. Left atrial size was severely dilated.  4. The mitral valve is moderately calcified. Trivial mitral valve  regurgitation.  5. The aortic valve is tricuspid. Aortic valve regurgitation is not  visualized.  6. The inferior vena cava is normal in size with greater than 50%  respiratory variability, suggesting right atrial pressure of 3 mmHg.  7. Cannot rule out PFO/ASD. Consider saline contrast study for further  evaluation.    Assessment: 74 y.o. 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  1. Uncertain if the stroke was due to the meningioma as the right MCA and distal ICA appear patent on MRI brain from 9/22 2. Stroke Risk Factors - CHF, HTN, MI and OSA.  Recommendations: 1. CTA of head and neck.  2. Following CTA results, may need 4-vessel catheter-based angiogram tomorrow to assess indications for possible right MCA stent.  3. Will defer to Neurosurgery regarding risks/benefits of possible meningioma removal with or without prior stent placement.  4. If trickle flow through a severely stenotic vessel is revealed  on CTA, he may be a candidate for anticoagulation with heparin. However, there is a risk of hemorrhagic transformation of the right MCA stroke if anticoagulated. For now, would treat with ASA 325 mg po qd, unless Neurosurgery feels that this should be held in anticipation of possible surgical intervention.   5. Cardiac telemetry 6. Carotid ultrasound 7. BP management per Neurosurgery 8. Stroke Team to follow in the AM  Addendum: CTA of head and neck completed. Report conclusions: 1. Large meningioma right cavernous sinus extending into the prepontine cistern on the right. Mass enters the sella. 2. Extensive atherosclerotic calcification in the cavernous carotid bilaterally with mild stenosis. The tumor abuts the right cavernous and supraclinoid internal carotid artery without encasement or occlusion. 3. Mild calcific stenosis in the distal vertebral artery bilaterally. No thrombus identified in the left vertebral artery as question on MRI.  @Electronically  signed: Dr. Kerney Elbe  11/14/2019, 8:03 PM

## 2019-11-15 ENCOUNTER — Inpatient Hospital Stay (HOSPITAL_COMMUNITY): Payer: Medicare Other

## 2019-11-15 ENCOUNTER — Inpatient Hospital Stay (HOSPITAL_COMMUNITY): Payer: No Typology Code available for payment source

## 2019-11-15 DIAGNOSIS — I672 Cerebral atherosclerosis: Secondary | ICD-10-CM

## 2019-11-15 DIAGNOSIS — G459 Transient cerebral ischemic attack, unspecified: Secondary | ICD-10-CM

## 2019-11-15 DIAGNOSIS — F17200 Nicotine dependence, unspecified, uncomplicated: Secondary | ICD-10-CM

## 2019-11-15 DIAGNOSIS — I6330 Cerebral infarction due to thrombosis of unspecified cerebral artery: Secondary | ICD-10-CM | POA: Insufficient documentation

## 2019-11-15 DIAGNOSIS — E7800 Pure hypercholesterolemia, unspecified: Secondary | ICD-10-CM

## 2019-11-15 DIAGNOSIS — I10 Essential (primary) hypertension: Secondary | ICD-10-CM

## 2019-11-15 LAB — HEMOGLOBIN A1C
Hgb A1c MFr Bld: 5.4 % (ref 4.8–5.6)
Mean Plasma Glucose: 108.28 mg/dL

## 2019-11-15 LAB — BASIC METABOLIC PANEL
Anion gap: 11 (ref 5–15)
BUN: 18 mg/dL (ref 8–23)
CO2: 24 mmol/L (ref 22–32)
Calcium: 9.9 mg/dL (ref 8.9–10.3)
Chloride: 107 mmol/L (ref 98–111)
Creatinine, Ser: 1.19 mg/dL (ref 0.61–1.24)
GFR calc Af Amer: >60 mL/min (ref >60)
GFR calc non Af Amer: 59 mL/min — ABNORMAL LOW (ref >60)
Glucose, Bld: 121 mg/dL — ABNORMAL HIGH (ref 70–99)
Potassium: 4.7 mmol/L (ref 3.5–5.1)
Sodium: 142 mmol/L (ref 135–145)

## 2019-11-15 LAB — LIPID PANEL
Cholesterol: 189 mg/dL (ref 0–200)
HDL: 64 mg/dL (ref >40)
LDL Cholesterol: 114 mg/dL — ABNORMAL HIGH (ref 0–99)
Total CHOL/HDL Ratio: 3 RATIO
Triglycerides: 54 mg/dL (ref <150)
VLDL: 11 mg/dL (ref 0–40)

## 2019-11-15 LAB — CBC
HCT: 47.6 % (ref 39.0–52.0)
Hemoglobin: 15.9 g/dL (ref 13.0–17.0)
MCH: 32.3 pg (ref 26.0–34.0)
MCHC: 33.4 g/dL (ref 30.0–36.0)
MCV: 96.6 fL (ref 80.0–100.0)
Platelets: 185 10*3/uL (ref 150–400)
RBC: 4.93 MIL/uL (ref 4.22–5.81)
RDW: 14.6 % (ref 11.5–15.5)
WBC: 15.1 10*3/uL — ABNORMAL HIGH (ref 4.0–10.5)
nRBC: 0 % (ref 0.0–0.2)

## 2019-11-15 LAB — GLUCOSE, CAPILLARY: Glucose-Capillary: 116 mg/dL — ABNORMAL HIGH (ref 70–99)

## 2019-11-15 MED ORDER — ASPIRIN 300 MG RE SUPP
300.0000 mg | Freq: Every day | RECTAL | Status: DC
  Administered 2019-11-16: 300 mg via RECTAL
  Filled 2019-11-15 (×3): qty 1

## 2019-11-15 MED ORDER — ADULT MULTIVITAMIN W/MINERALS CH
1.0000 | ORAL_TABLET | Freq: Every day | ORAL | Status: DC
  Administered 2019-11-15 – 2019-11-28 (×12): 1 via ORAL
  Filled 2019-11-15 (×13): qty 1

## 2019-11-15 MED ORDER — METOLAZONE 2.5 MG PO TABS
2.5000 mg | ORAL_TABLET | ORAL | Status: DC
  Administered 2019-11-26: 2.5 mg via ORAL
  Filled 2019-11-15 (×2): qty 1

## 2019-11-15 MED ORDER — PANTOPRAZOLE SODIUM 20 MG PO TBEC
20.0000 mg | DELAYED_RELEASE_TABLET | Freq: Every day | ORAL | Status: DC
  Administered 2019-11-16: 20 mg via ORAL
  Filled 2019-11-15 (×3): qty 1

## 2019-11-15 MED ORDER — METOPROLOL TARTRATE 25 MG PO TABS
25.0000 mg | ORAL_TABLET | Freq: Two times a day (BID) | ORAL | Status: DC
  Administered 2019-11-15 – 2019-11-18 (×8): 25 mg via ORAL
  Filled 2019-11-15 (×10): qty 1

## 2019-11-15 MED ORDER — CHLORHEXIDINE GLUCONATE 0.12 % MT SOLN
15.0000 mL | Freq: Two times a day (BID) | OROMUCOSAL | Status: DC
  Administered 2019-11-15 – 2019-11-27 (×23): 15 mL via OROMUCOSAL
  Filled 2019-11-15 (×23): qty 15

## 2019-11-15 MED ORDER — POTASSIUM CHLORIDE CRYS ER 10 MEQ PO TBCR
10.0000 meq | EXTENDED_RELEASE_TABLET | Freq: Two times a day (BID) | ORAL | Status: DC
  Administered 2019-11-15 – 2019-11-28 (×24): 10 meq via ORAL
  Filled 2019-11-15 (×33): qty 1

## 2019-11-15 MED ORDER — ASPIRIN EC 325 MG PO TBEC
325.0000 mg | DELAYED_RELEASE_TABLET | Freq: Every day | ORAL | Status: DC
  Administered 2019-11-15 – 2019-11-20 (×5): 325 mg via ORAL
  Filled 2019-11-15 (×6): qty 1

## 2019-11-15 MED ORDER — ORAL CARE MOUTH RINSE
15.0000 mL | Freq: Two times a day (BID) | OROMUCOSAL | Status: DC
  Administered 2019-11-15 – 2019-11-28 (×18): 15 mL via OROMUCOSAL

## 2019-11-15 MED ORDER — ATORVASTATIN CALCIUM 80 MG PO TABS
80.0000 mg | ORAL_TABLET | Freq: Every day | ORAL | Status: DC
  Administered 2019-11-16 – 2019-11-27 (×9): 80 mg via ORAL
  Filled 2019-11-15 (×10): qty 1

## 2019-11-15 MED ORDER — ENOXAPARIN SODIUM 40 MG/0.4ML ~~LOC~~ SOLN
40.0000 mg | SUBCUTANEOUS | Status: DC
  Administered 2019-11-15 – 2019-11-20 (×6): 40 mg via SUBCUTANEOUS
  Filled 2019-11-15 (×6): qty 0.4

## 2019-11-15 MED ORDER — ATORVASTATIN CALCIUM 40 MG PO TABS
40.0000 mg | ORAL_TABLET | Freq: Every day | ORAL | Status: DC
  Administered 2019-11-15: 40 mg via ORAL
  Filled 2019-11-15: qty 1

## 2019-11-15 MED ORDER — FUROSEMIDE 40 MG PO TABS
40.0000 mg | ORAL_TABLET | Freq: Two times a day (BID) | ORAL | Status: DC
  Administered 2019-11-15 – 2019-11-28 (×21): 40 mg via ORAL
  Filled 2019-11-15 (×21): qty 1

## 2019-11-15 MED ORDER — ISOSORBIDE MONONITRATE ER 30 MG PO TB24
120.0000 mg | ORAL_TABLET | Freq: Every day | ORAL | Status: DC
  Filled 2019-11-15: qty 4

## 2019-11-15 MED ORDER — ISOSORBIDE MONONITRATE ER 30 MG PO TB24
60.0000 mg | ORAL_TABLET | Freq: Every day | ORAL | Status: DC
  Administered 2019-11-15 – 2019-11-16 (×2): 60 mg via ORAL
  Filled 2019-11-15: qty 2

## 2019-11-15 MED ORDER — CLOPIDOGREL BISULFATE 75 MG PO TABS
75.0000 mg | ORAL_TABLET | Freq: Every day | ORAL | Status: DC
  Administered 2019-11-15 – 2019-11-21 (×6): 75 mg via ORAL
  Filled 2019-11-15 (×7): qty 1

## 2019-11-15 NOTE — Evaluation (Signed)
Physical Therapy Evaluation Patient Details Name: Charles Sherman MRN: [id number] DOB: 05-27-1945 Today's Date: 11/15/2019   History of Present Illness  74 year old gentleman with known right medial sphenoid wing meningioma scheduled for routine outpatient follow-up after an MRI scan on the 15th.  However patient presented to the outside hospital emergency room with left-sided weakness last night work-up is revealed multiple acute infarcts right MCA distribution. PMH includes CHF, COPD, HTN, MI, OSA.  Clinical Impression  Pt presents to PT with deficits in functional mobility, gait, balance, strength, power, visual-spatial awareness, coordination, cognition, safety awareness, and awareness of deficits. Pt presents with significant L weakness and L inattention throughout session. Pt with a strong left lateral lean in sitting and standing, leading to this PT deferring transfer attempts due to safety concerns. Pt will benefit from continued acute PT POC to improve mobility and awareness, and to reduce falls risk. PT recommends CIR placement at this time as the pt demonstrates the potential to make significant functional gains toward regaining independence.    Follow Up Recommendations CIR    Equipment Recommendations  Wheelchair (measurements PT);Wheelchair cushion (measurements PT);Hospital bed (mechanical lift, all if home today)    Recommendations for Other Services Rehab consult     Precautions / Restrictions Precautions Precautions: Fall Restrictions Weight Bearing Restrictions: No      Mobility  Bed Mobility Overal bed mobility: Needs Assistance Bed Mobility: Supine to Sit;Sit to Supine     Supine to sit: Max assist Sit to supine: +2 for physical assistance;Max assist   General bed mobility comments: pt requires cueing to mobilize LLE and significant physical assistance to move LLE and to elevate trunk into sitting  Transfers Overall transfer level: Needs  assistance Equipment used: 2 person hand held assist Transfers: Sit to/from Stand Sit to Stand: Max assist;+2 physical assistance         General transfer comment: pt with strong left lateral lean despite PT verbal and tactile cues to facilitate R lateral lean. Pt unable to take steps or to pivot safely at this time due to significant lean  Ambulation/Gait                Stairs            Wheelchair Mobility    Modified Rankin (Stroke Patients Only) Modified Rankin (Stroke Patients Only) Pre-Morbid Rankin Score: No symptoms Modified Rankin: Severe disability     Balance Overall balance assessment: Needs assistance Sitting-balance support: Single extremity supported;Feet supported Sitting balance-Leahy Scale: Poor Sitting balance - Comments: min-modA due to left lean Postural control: Left lateral lean Standing balance support: Bilateral upper extremity supported Standing balance-Leahy Scale: Zero Standing balance comment: max-totalA due to strong left lateral lean                             Pertinent Vitals/Pain Pain Assessment: No/denies pain    Home Living Family/patient expects to be discharged to:: Private residence Living Arrangements: Spouse/significant other Available Help at Discharge: Family;Available 24 hours/day (minA per patient) Type of Home: House Home Access: Stairs to enter Entrance Stairs-Rails: None Entrance Stairs-Number of Steps: 2 Home Layout: One level;Laundry or work area in [place]: Environmental consultant - 2 wheels;Cane - single point      Prior Function Level of Independence: Independent         Comments: pt reports he enjoys going fishing     Hand Dominance   Dominant Hand: Right  Extremity/Trunk Assessment   Upper Extremity Assessment Upper Extremity Assessment: LUE deficits/detail LUE Deficits / Details: grossly 2/5 shoulder, 3-/5 elbow flexion/extension, 3/5 grip strength LUE Sensation:  decreased proprioception    Lower Extremity Assessment Lower Extremity Assessment: LLE deficits/detail LLE Deficits / Details: grossly 4-/5 LLE Sensation: decreased proprioception    Cervical / Trunk Assessment Cervical / Trunk Assessment: Kyphotic  Communication   Communication:  (dysarthric)  Cognition Arousal/Alertness: Awake/alert Behavior During Therapy: Flat affect Overall Cognitive Status: Impaired/Different from baseline Area of Impairment: Orientation;Attention;Memory;Following commands;Awareness;Safety/judgement;Problem solving                 Orientation Level: Disoriented to;Place Current Attention Level: Sustained Memory: Decreased recall of precautions;Decreased short-term memory Following Commands: Follows one step commands with increased time Safety/Judgement: Decreased awareness of safety;Decreased awareness of deficits Awareness: Intellectual Problem Solving: Slow processing;Decreased initiation;Difficulty sequencing;Requires tactile cues;Requires verbal cues        General Comments General comments (skin integrity, edema, etc.): pt on 2L [place] at start of session, weaned to RA during mobility with stable sats in low 90s. Pt demonstrates left inattention and neglect    Exercises     Assessment/Plan    PT Assessment Patient needs continued PT services  PT Problem List Decreased strength;Decreased activity tolerance;Decreased balance;Decreased mobility;Decreased coordination;Decreased cognition;Decreased knowledge of use of DME;Decreased safety awareness;Decreased knowledge of precautions;Impaired sensation       PT Treatment Interventions DME instruction;Gait training;Stair training;Functional mobility training;Therapeutic activities;Therapeutic exercise;Balance training;Neuromuscular re-education;Cognitive remediation;Patient/family education    PT Goals (Current goals can be found in the Care Plan section)  Acute Rehab PT Goals Patient Stated Goal:  To improve mobility quality PT Goal Formulation: With patient Time For Goal Achievement: 11/29/19 Potential to Achieve Goals: Fair    Frequency Min 4X/week   Barriers to discharge        Co-evaluation               AM-PAC PT "6 Clicks" Mobility  Outcome Measure Help needed turning from your back to your side while in a flat bed without using bedrails?: A Lot Help needed moving from lying on your back to sitting on the side of a flat bed without using bedrails?: Total Help needed moving to and from a bed to a chair (including a wheelchair)?: Total Help needed standing up from a chair using your arms (e.g., wheelchair or bedside chair)?: Total Help needed to walk in hospital room?: Total Help needed climbing 3-5 steps with a railing? : Total 6 Click Score: 7    End of Session Equipment Utilized During Treatment: Oxygen;Gait belt Activity Tolerance: Patient tolerated treatment well Patient left: in bed;with call bell/phone within reach;with bed alarm set Nurse Communication: Mobility status;Need for lift equipment PT Visit Diagnosis: Unsteadiness on feet (R26.81);Other abnormalities of gait and mobility (R26.89);Muscle weakness (generalized) (M62.81);Other symptoms and signs involving the nervous system (R29.898);Hemiplegia and hemiparesis Hemiplegia - Right/Left: Left Hemiplegia - dominant/non-dominant: Non-dominant Hemiplegia - caused by: Cerebral infarction    Time: 2956-2130 PT Time Calculation (min) (ACUTE ONLY): 24 min   Charges:   PT Evaluation $PT Eval Moderate Complexity: 1 Mod PT Treatments $Therapeutic Activity: 8-22 mins        Zenaida Niece, PT, DPT Acute Rehabilitation Pager: [phone]   Zenaida Niece 11/15/2019, 5:52 PM

## 2019-11-15 NOTE — Progress Notes (Signed)
Patient ID: Charles Sherman, male   DOB: Nov 30, 1945, 74 y.o.   MRN: [id number] Patient is lethargic on examination but will arouse to constant stimulation.  He is markedly weak on the left side.  He has gross motor movement but will not move individual fingers or create a grip on his left hand.  Also withdraws his left lower extremity with some spontaneous movement.  I reviewed the MRI imaging CTA and discussed the situation with Dr. Rigoberto Noel from neurology.  He believes that because of the atherosclerotic vessels and his blood pressure he likely created the stroke in the middle cerebral distribution.  Does not believe that these are truly embolic.  He believes that the posterior parieto-occipital region stroke is secondary to watershed distribution.  Patient does have significant vertebral disease also.  At this point he is not a candidate for surgical intervention for the meningioma.  We will continue to support him and allow his blood pressure to rise to 1 50-1 70 range.  He will likely need some prolonged rehabilitation from this most recent stroke before surgery can be considered.  I contacted the wife by phone to discuss the situation and the plan.  She is agreeable.

## 2019-11-15 NOTE — Progress Notes (Addendum)
STROKE TEAM PROGRESS NOTE   INTERVAL HISTORY No family at bedside. Pt lethargic and sleepy but easily arousable and following simple commands on the right. Still has left UE plegia and LE paresis. Right gaze preference, chronic right eye blind. MRI showed right MCA infarcts. CTA showed b/l ICA siphon severe athero with right MCA lifted up by the meningioma.   Vitals:   11/15/19 0600 11/15/19 0700 11/15/19 0800 11/15/19 0900  BP: (!) 164/74 (!) 154/70 (!) 165/69 (!) 170/86  Pulse: (!) 53 (!) 50 (!) 50 (!) 56  Resp: 19 16 17 10   Temp:   98 F (36.7 C)   TempSrc:   Oral   SpO2: 98% 97% 97% 98%   CBC: No results for input(s): WBC, NEUTROABS, HGB, HCT, MCV, PLT in the last 168 hours. Basic Metabolic Panel: No results for input(s): NA, K, CL, CO2, GLUCOSE, BUN, CREATININE, CALCIUM, MG, PHOS in the last 168 hours. Lipid Panel: No results for input(s): CHOL, TRIG, HDL, CHOLHDL, VLDL, LDLCALC in the last 168 hours. HgbA1c: No results for input(s): HGBA1C in the last 168 hours. Urine Drug Screen: No results for input(s): LABOPIA, COCAINSCRNUR, LABBENZ, AMPHETMU, THCU, LABBARB in the last 168 hours.  Alcohol Level No results for input(s): ETH in the last 168 hours.  IMAGING past 24 hours CT ANGIO HEAD W OR WO CONTRAST  Result Date: 11/14/2019 CLINICAL DATA:  Acute neuro deficit. Rule out stroke. Intracranial mass lesion. EXAM: CT ANGIOGRAPHY HEAD TECHNIQUE: Multidetector CT imaging of the head was performed using the standard protocol during bolus administration of intravenous contrast. Multiplanar CT image reconstructions and MIPs were obtained to evaluate the vascular anatomy. CONTRAST:  67mL OMNIPAQUE IOHEXOL 350 MG/ML SOLN COMPARISON:  MRI head 11/14/2019 FINDINGS: CT HEAD Brain: Large mass arising from the right cavernous sinus with peripheral calcification. This showed enhancement on MRI and is most consistent with a large meningioma. There is extension into the prepontine cistern on the right  with mild mass-effect on the ventral pons on the right. There is surrounding white matter edema and mass-effect. 7 mm midline shift to the left unchanged. Tumor extends into the sella. Negative for acute infarct or hemorrhage. Patchy white matter hypodensity bilaterally most consistent with chronic microvascular ischemia as well as vasogenic edema from the tumor on the right. Vascular: Atherosclerotic calcification in the distal vertebral and cavernous carotid arteries bilaterally. Negative for hyperdense vessel Skull: Negative Sinuses: Mild mucosal edema paranasal sinuses. Orbits: Bilateral proptosis.  No orbital mass. CTA HEAD Anterior circulation: Heavy calcification throughout the cavernous carotid bilaterally with mild stenosis bilaterally. Right cavernous sinus meningioma abuts the supraclinoid internal carotid artery without encasement or narrowing. Right middle cerebral artery is up lifted by tumor. Anterior and middle cerebral arteries patent bilaterally without stenosis or large vessel occlusion. Posterior circulation: Atherosclerotic calcification and mild stenosis distal vertebral artery bilaterally. MRI abnormality distal left vertebral artery corresponding to calcified plaque. No thrombus or filling defect. PICA patent bilaterally. Basilar is patent bilaterally. AICA patent bilaterally. The mass abuts the superior cerebellar artery on the right without occlusion. Posterior cerebral arteries patent bilaterally without occlusion or stenosis. Venous sinuses: Normal venous enhancement. Anatomic variants: None IMPRESSION: 1. Large meningioma right cavernous sinus extending into the prepontine cistern on the right. Mass enters the sella. 2. Extensive atherosclerotic calcification in the cavernous carotid bilaterally with mild stenosis. The tumor abuts the right cavernous and supraclinoid internal carotid artery without encasement or occlusion. 3. Mild calcific stenosis in the distal vertebral artery  bilaterally. No  thrombus identified in the left vertebral artery as question on MRI. Electronically Signed   By: Franchot Gallo M.D.   On: 11/14/2019 21:35    PHYSICAL EXAM  Temp:  [98 F (36.7 C)-98.5 F (36.9 C)] 98 F (36.7 C) (09/23 0800) Pulse Rate:  [48-75] 48 (09/23 1000) Resp:  [10-22] 16 (09/23 1000) BP: (154-185)/(69-88) 161/75 (09/23 1000) SpO2:  [89 %-99 %] 95 % (09/23 1000)  General - Well nourished, well developed, lethargic and sleepy.  Ophthalmologic - fundi not visualized due to noncooperation.  Cardiovascular - Regular rhythm and rate.  Neuro - sleepy but arousable, able to follow up commands on the right. Orientated to age and place, not to time. Paucity of speech, able to answer questions in short words, able to name and repeat simple sentences. Right gaze preference, able to cross midline, left gaze incomplete. Eyes conjugate, limited upper and lower gaze. Right legally blind, with RAPD. Left able to count fingers, intact pupillary reflex. Left facial droop. Tongue midline. RUE 5/5, RLE 4/5. However, LUE 0/5 but increased muscle tone. LLE 3-/5 proximal and 3+/5 distal DF. Sensation symmetrical per pt, right FTN intact. Gait not tested.   ASSESSMENT/PLAN Mr. LIEM COPENHAVER is a 74 y.o. male with history of COPD, CHF, HTN, MI and OSA presenting to [facility] 9/22 with L facial droop, L sided weakness and dysarthria. Known meningioma from 05/2019 with R CRAO, followed by NS w/ scheduled appt for 9/23.  Stroke: Scattered R MCA and ACA infarcts in setting of right ICA siphon extensive atherosclerosis and right MCA narrowing due to large R meningioma. Cannot completely rule out embolic source  MRI  [facility] 9/22 R MCA and ACA scattered infarcts. Elevation of R MCA d/t mass effect. R middle cranial fossa meningioma involving R cavernous sinus and sella turcica unchanged w/ 68mm L midline shift.  Vasogenic edema on the right brain.  CTA head large R  cavernous sinus meningioma extending into prepontine cistern on the R, mass enters the sella. Extensive atherosclerotic calcification B cavernous ICA and supraclinoid ICA. Distal B VA mild stenoses. Right middle cerebral artery is up lifted by tumor.  Carotid Doppler nondiagnostic of right ICA due to extensive plaque  CTA neck pending  2D Echo pending  LDL 114  HgbA1c 5.4  VTE prophylaxis - lovenox   aspirin 81 mg daily prior to admission, now on aspirin 325 mg daily and plavix DAPT for 3 months and then plavix alone.   Therapy recommendations:  pending   Disposition:  pending   Meningioma, large  Large R cavernous area extending into prepontine cistern and sella w/ upward displacement of R MCA  Previously not a surgical candidate given R CRAO at time of dx  Currently not a surgical candidate given acute stroke  NS following for possible future removal  Vasogenic edema on Decadron  Hx of R CRAO  05/2019 right eye vision loss  02/2019 CUS Bilateral carotid atherosclerosis. No hemodynamically significant ICA stenosis.   This admission CUS nondiagnostic of right ICA due to extensive plaque  CTA neck pending  Hypertension  Home meds:  HCTZ 25 tid, imdur 120, amlodipine 10, lasix 80 bid, metoprolol 25 bid  BP 150-170s (off BP meds past day or so)  Permissive hypertension (OK if < 180/105) but gradually normalize in 3-5 days . Long-term BP goal 130-150 given ICA siphon extensive athero  Hyperlipidemia  Home meds:  lipitor 40, resumed in hospital  LDL 114, goal < 70  Increase  lipitor to 80  Continue statin at discharge  Tobacco abuse  Current smoker  Smoking cessation counseling provided  Pt is willing to quit     Other Stroke Risk Factors  Advanced age  Obesity, recommend weight loss, diet and exercise as appropriate   Coronary artery disease, hx MI w/ PCI x 3  Obstructive sleep apnea, on CPAP at home  Chronic diastolic Congestive heart  failure  Other Active Problems  Leukocytosis WBC 8.3->15.1  COPD  Hospital day # 1  This patient is critically ill due to right MCA stroke, right ICA siphon and MCA stenosis with atherosclerosis, large right meningioma, hypertension and at significant risk of neurological worsening, death form recurrent stroke, bleeding from tumor, brain herniation. This patient's care requires constant monitoring of vital signs, hemodynamics, respiratory and cardiac monitoring, review of multiple databases, neurological assessment, discussion with family, other specialists and medical decision making of high complexity. I spent 35 minutes of neurocritical care time in the care of this patient.  I also discussed with Dr. Ellene Route.  Rosalin Hawking, MD PhD Stroke Neurology 11/15/2019 6:22 PM    To contact Stroke Continuity provider, please refer to [email or web address] After hours, contact General Neurology

## 2019-11-15 NOTE — Progress Notes (Signed)
Rehab Admissions Coordinator Note:  Patient was screened by Cleatrice Burke for appropriateness for an Inpatient Acute Rehab Consult per therapy recs. .  At this time, we are recommending Inpatient Rehab consult. Please place an order if you would like pt considerd for CIR admit. Please advise.  Cleatrice Burke RN MSN 11/15/2019, 6:45 PM  I can be reached at [phone].

## 2019-11-15 NOTE — Evaluation (Signed)
Clinical/Bedside Swallow Evaluation Patient Details  Name: Charles Sherman MRN: [id number] Date of Birth: [birth year]  Today's Date: 11/15/2019 Time: SLP Start Time (ACUTE ONLY): 1139 SLP Stop Time (ACUTE ONLY): 1157 SLP Time Calculation (min) (ACUTE ONLY): 18 min  Past Medical History:  Past Medical History:  Diagnosis Date  . CHF (congestive heart failure) ([place])   . COPD (chronic obstructive pulmonary disease) ([place])   . HTN (hypertension)   . Myocardial infarct ([place])   . OSA (obstructive sleep apnea)    on CPAP   Past Surgical History:  Past Surgical History:  Procedure Laterality Date  . PERCUTANEOUS CORONARY STENT INTERVENTION (PCI-S)     HPI:  Pt is a 74 y.o. 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 PMH also includes: OSA, MI, HTN, COPD, CHF   Assessment / Plan / Recommendation Clinical Impression  Pt has left-sided facial weakness and tongue deviation to the left. Mild anterior loss is noted with cup sips, but otherwise he achieves improved labial seal for containment. Pt has L buccal pocketing with solids that he appears to be aware of, but it also takes time and effort to clear. Thin liquids and purees help as well. No overt s/s of aspiration were noted across challenging. Recommend starting Dys 2 (chopped) diet and thin liquids.   SLP Visit Diagnosis: Dysphagia, oral phase (R13.11)    Aspiration Risk  Mild aspiration risk    Diet Recommendation Dysphagia 2 (Fine chop);Thin liquid   Liquid Administration via: Cup;Straw Medication Administration: Whole meds with puree Supervision: Staff to assist with self feeding;Full supervision/cueing for compensatory strategies Compensations: Slow rate;Small sips/bites;Lingual sweep for clearance of pocketing Postural Changes: Seated upright at 90 degrees    Other  Recommendations Oral Care Recommendations: Oral care BID    Follow up Recommendations Inpatient Rehab      Frequency and Duration min 2x/week  2 weeks       Prognosis Prognosis for Safe Diet Advancement: Good      Swallow Study   General HPI: Pt is a 74 y.o. 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 PMH also includes: OSA, MI, HTN, COPD, CHF Type of Study: Bedside Swallow Evaluation Previous Swallow Assessment: none in chart Diet Prior to this Study: NPO Temperature Spikes Noted: No Respiratory Status: Nasal cannula History of Recent Intubation: No Behavior/Cognition: Alert;Cooperative;Requires cueing Oral Cavity Assessment: Within Functional Limits Oral Care Completed by SLP: Recent completion by staff Oral Cavity - Dentition: Adequate natural dentition Vision: Functional for self-feeding Self-Feeding Abilities: Needs assist Patient Positioning: Upright in bed Baseline Vocal Quality: Normal Volitional Swallow: Able to elicit    Oral/Motor/Sensory Function Overall Oral Motor/Sensory Function: Moderate impairment Facial ROM: Reduced left;Suspected CN VII (facial) dysfunction Facial Symmetry: Abnormal symmetry left;Suspected CN VII (facial) dysfunction Facial Strength: Reduced left;Suspected CN VII (facial) dysfunction Lingual ROM: Suspected CN XII (hypoglossal) dysfunction Lingual Symmetry: Abnormal symmetry left;Suspected CN XII (hypoglossal) dysfunction Lingual Strength: Reduced;Suspected CN XII (hypoglossal) dysfunction Velum: Within Functional Limits   Ice Chips Ice chips: Within functional limits Presentation: Spoon   Thin Liquid Thin Liquid: Impaired Presentation: Cup;Self Fed;Straw Oral Phase Impairments: Reduced labial seal Oral Phase Functional Implications: Left anterior spillage    Nectar Thick Nectar Thick Liquid: Not tested   Honey Thick Honey Thick Liquid: Not tested   Puree Puree: Within functional  limits Presentation: Spoon   Solid  Solid: Impaired Presentation: Self Fed Oral Phase Functional Implications: Left lateral sulci pocketing;Oral residue      Charles Sherman., M.A. [place] Pager [phone] Office [phone]  11/15/2019,12:19 PM

## 2019-11-15 NOTE — Progress Notes (Signed)
Carotid study completed.   See CVProc for preliminary results.   Jonpaul Lumm, RDMS, RVT 

## 2019-11-16 ENCOUNTER — Inpatient Hospital Stay (HOSPITAL_COMMUNITY): Payer: Medicare Other

## 2019-11-16 DIAGNOSIS — D72829 Elevated white blood cell count, unspecified: Secondary | ICD-10-CM

## 2019-11-16 DIAGNOSIS — I161 Hypertensive emergency: Secondary | ICD-10-CM

## 2019-11-16 DIAGNOSIS — G936 Cerebral edema: Secondary | ICD-10-CM

## 2019-11-16 DIAGNOSIS — I6389 Other cerebral infarction: Secondary | ICD-10-CM

## 2019-11-16 DIAGNOSIS — H3411 Central retinal artery occlusion, right eye: Secondary | ICD-10-CM

## 2019-11-16 DIAGNOSIS — I6521 Occlusion and stenosis of right carotid artery: Secondary | ICD-10-CM

## 2019-11-16 LAB — ECHOCARDIOGRAM COMPLETE
Area-P 1/2: 3.72 cm2
S' Lateral: 3.1 cm

## 2019-11-16 LAB — GLUCOSE, CAPILLARY
Glucose-Capillary: 117 mg/dL — ABNORMAL HIGH (ref 70–99)
Glucose-Capillary: 137 mg/dL — ABNORMAL HIGH (ref 70–99)
Glucose-Capillary: 143 mg/dL — ABNORMAL HIGH (ref 70–99)

## 2019-11-16 IMAGING — CT CT HEAD W/O CM
2 of 4 series · 14 of 47 positions shown, 17 images · non-contrast
Comparison: CT head [DATE].  MRI head [DATE]

CLINICAL DATA: Stroke follow-up

EXAM:
CT HEAD WITHOUT CONTRAST
TECHNIQUE: Contiguous axial images were obtained from the base of the skull
through the vertex without intravenous contrast.

[Series 4: head 2.0 h70h · axial · 0.44mm/px · z∈[-209,-57]mm · 11 of 86 slices shown, 14 images]
[im 5/86  brain]
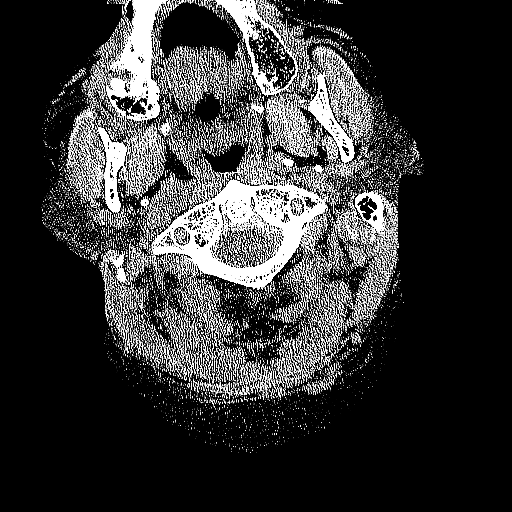
[im 5/86  bone]
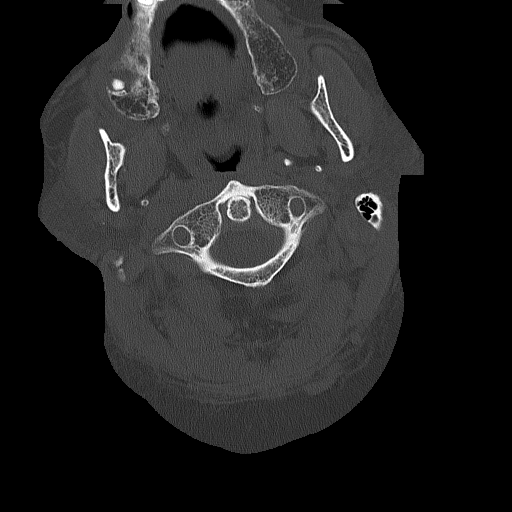
[im 13/86  brain]
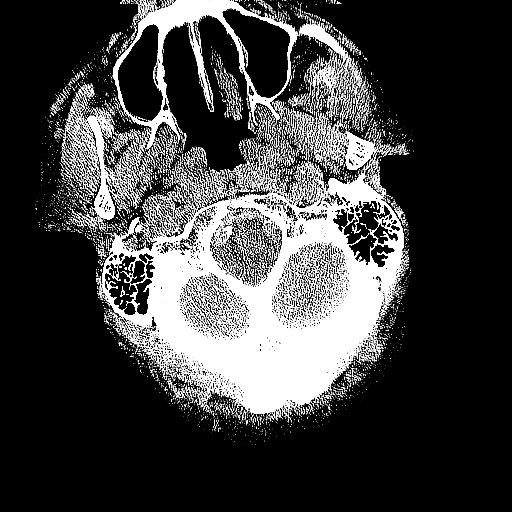
[im 22/86  brain]
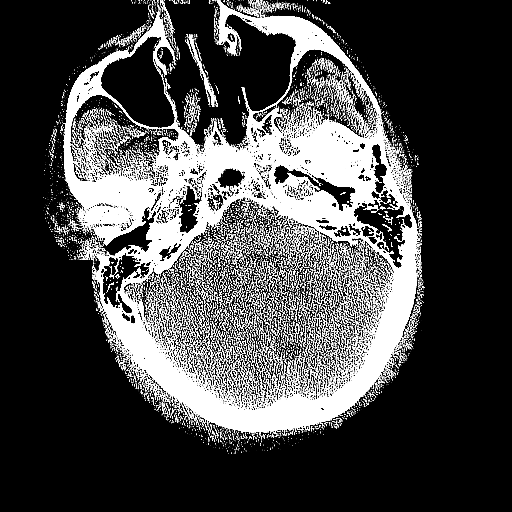
[im 30/86  brain]
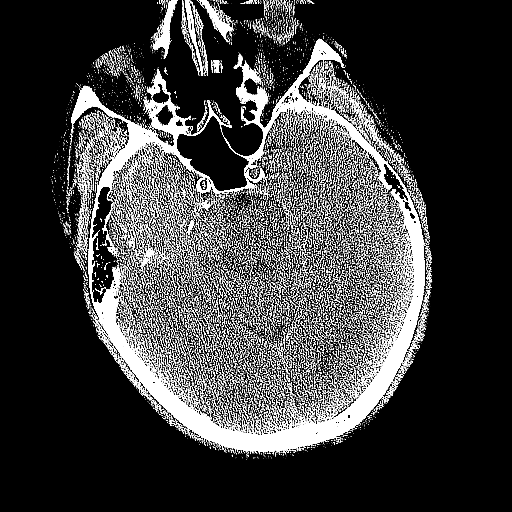
[im 35/86  brain]
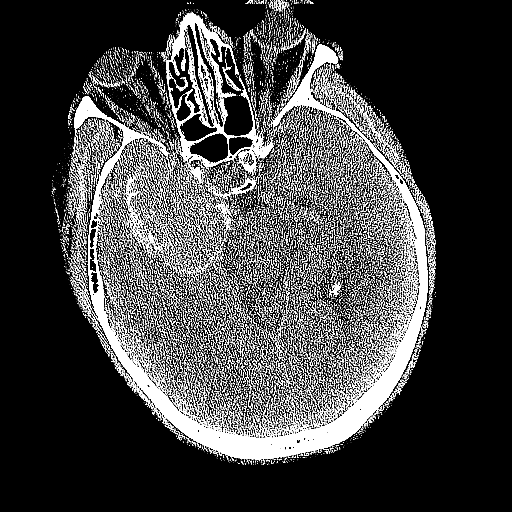
[im 35/86  bone]
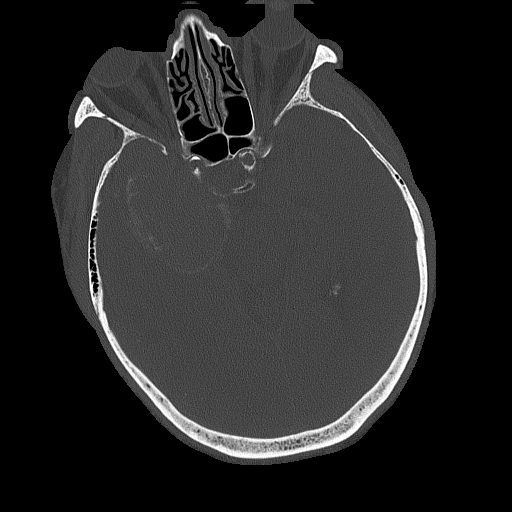
[im 43/86  brain]
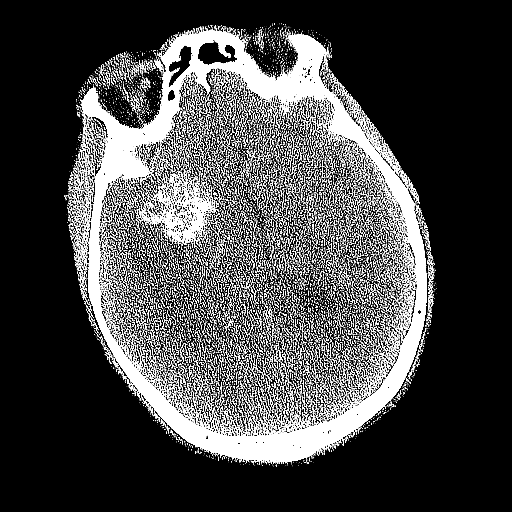
[im 52/86  brain]
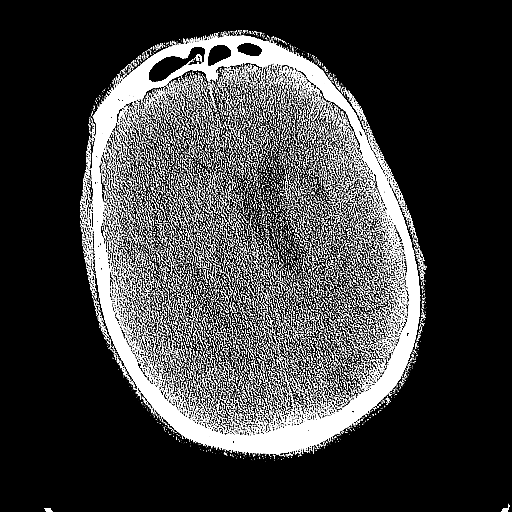
[im 56/86  brain]
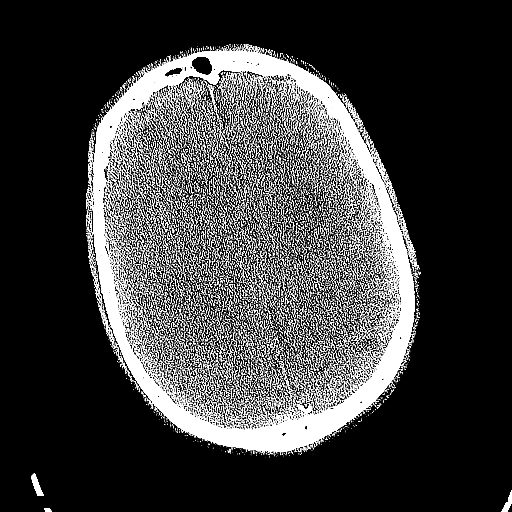
[im 64/86  brain]
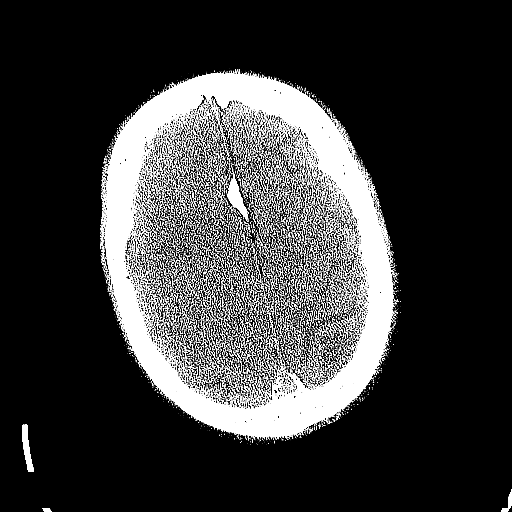
[im 64/86  bone]
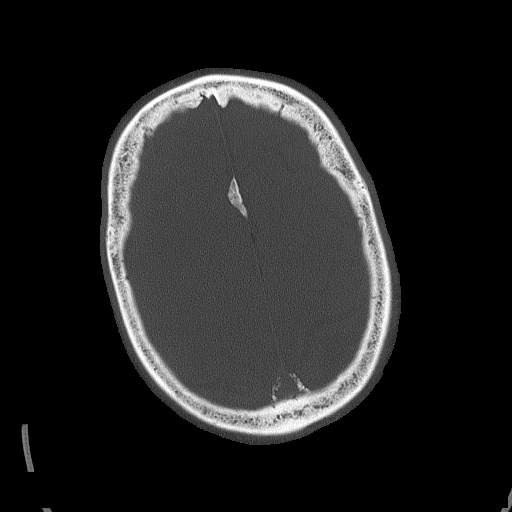
[im 73/86  brain]
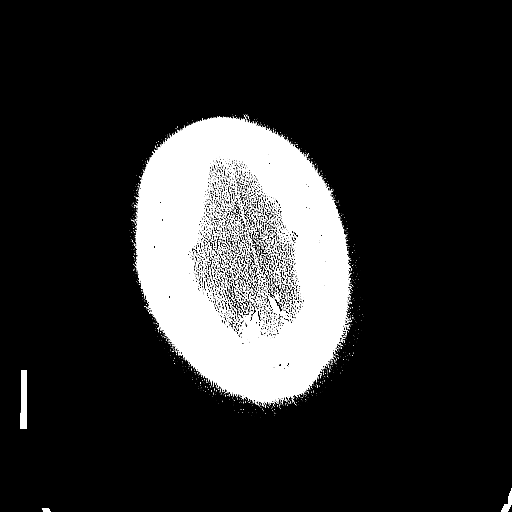
[im 81/86  brain]
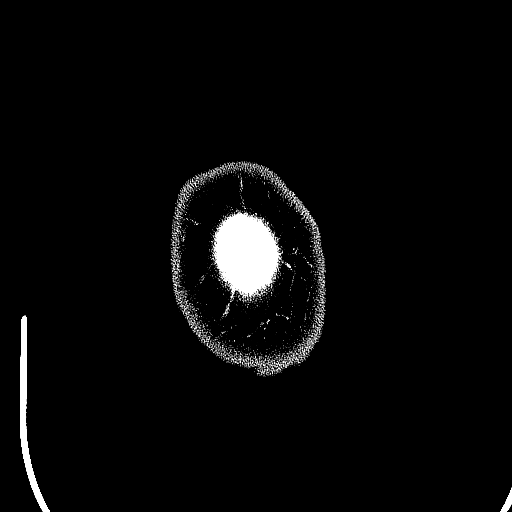

[Series 5: head 3.0 mpr cor · coronal · 0.34mm/px · 3 of 77 slices shown]
[im 26/77  brain]
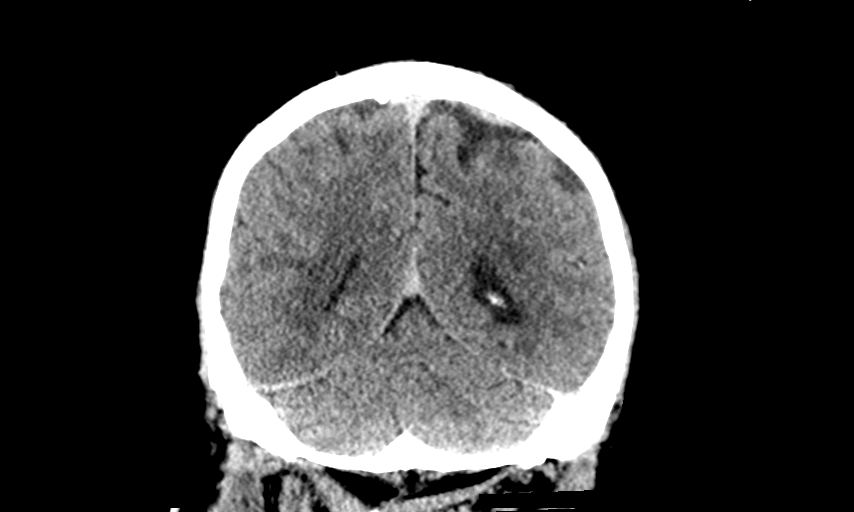
[im 34/77  brain]
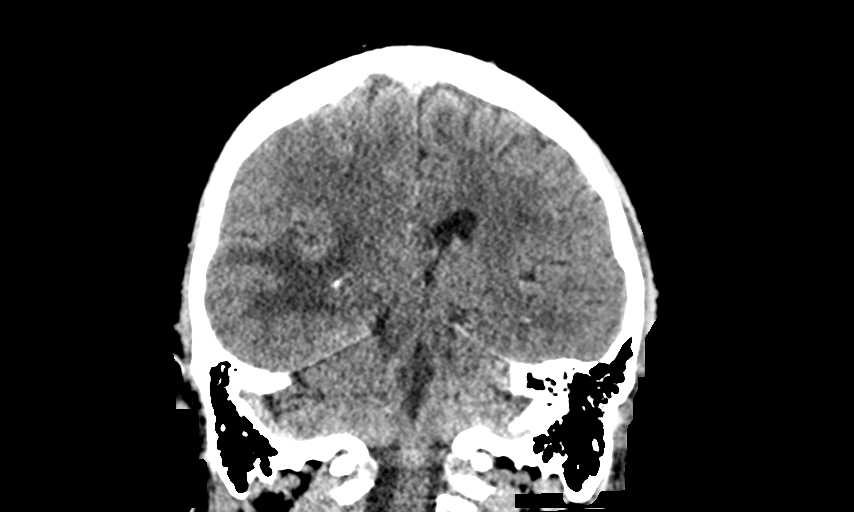
[im 43/77  brain]
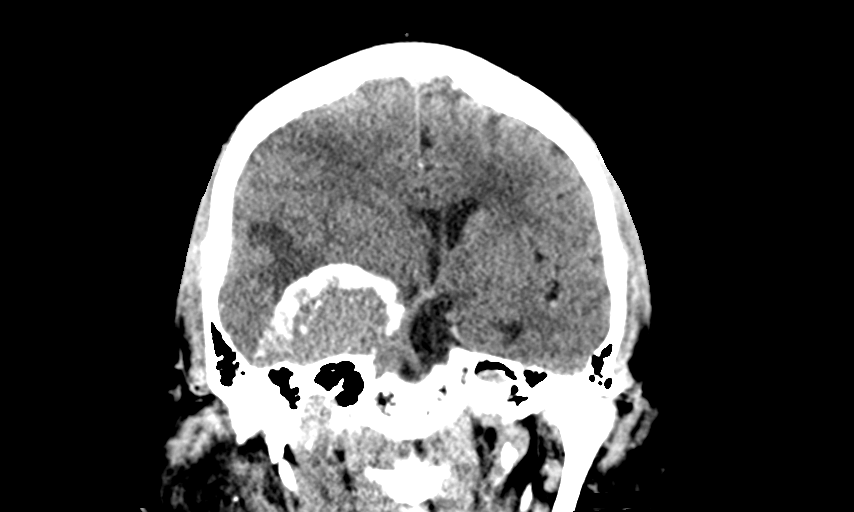

[14 of 47 positions shown; findings below may reference images not displayed]

FINDINGS: Brain: Hypodensity in the right frontal parietal cortex compatible
with acute infarct as noted on MRI. This extends into the right
occipital lobe. No associated hemorrhage. Chronic microvascular
ischemic changes in the white matter.

Large calcified meningioma right cavernous sinus unchanged. This
extends into the prepontine cistern with mild mass-effect on the
pons on the right. There is surrounding vasogenic edema in the right
temporoparietal lobe.

Vascular: Negative for hyperdense vessel

Skull: Negative

Sinuses/Orbits: Mild mucosal edema paranasal sinuses. Negative
orbit.

Other: None
IMPRESSION: Acute infarct right MCA territory in the right frontal parietal lobe
extending into the occipital lobe is unchanged. No acute hemorrhage

Large right cavernous sinus meningioma unchanged. There is vasogenic
edema in the right temporoparietal lobe. 9 mm midline shift to the
left unchanged.

## 2019-11-16 IMAGING — CT CT ANGIO NECK
2 of 7 series · 8 of 33 positions shown · IV contrast (omnipaque)
Comparison: Prior CTA from [DATE].

CLINICAL DATA: Follow-up examination for acute stroke.

EXAM:
CT ANGIOGRAPHY NECK
TECHNIQUE: Multidetector CT imaging of the neck was performed using the
standard protocol during bolus administration of intravenous
contrast. Multiplanar CT image reconstructions and MIPs were
obtained to evaluate the vascular anatomy. Carotid stenosis
measurements (when applicable) are obtained utilizing NASCET
criteria, using the distal internal carotid diameter as the
denominator.
CONTRAST:  100mL OMNIPAQUE IOHEXOL 350 MG/ML SOLN

[Series 5: cta neck · axial · 0.48mm/px · z∈[-247,-153]mm · 2 of 142 slices shown]
[im 48/142  soft-tissue]
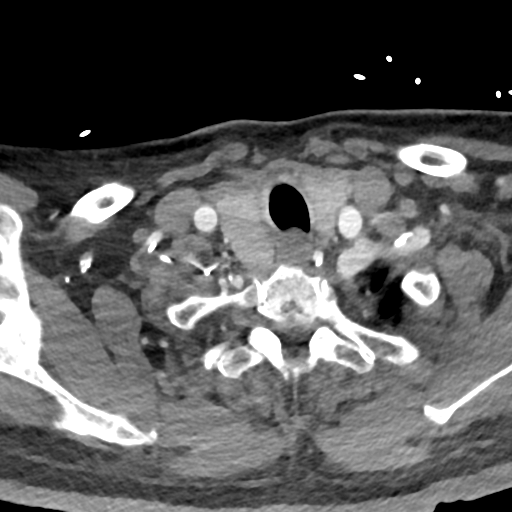
[im 95/142  soft-tissue]
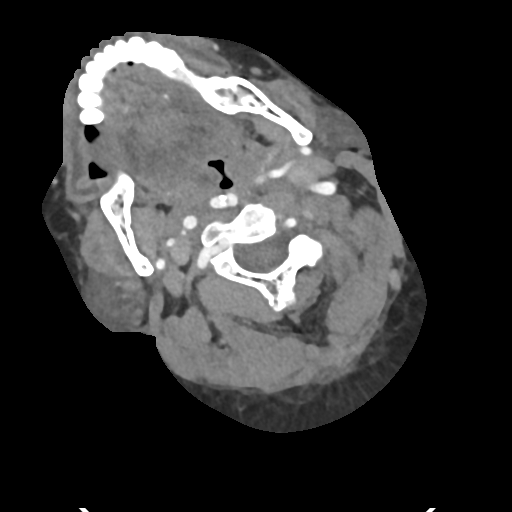

[Series 7: cta neck axial · axial · 0.39mm/px · z∈[-300,-100]mm · 6 of 282 slices shown]
[im 41/282  soft-tissue]
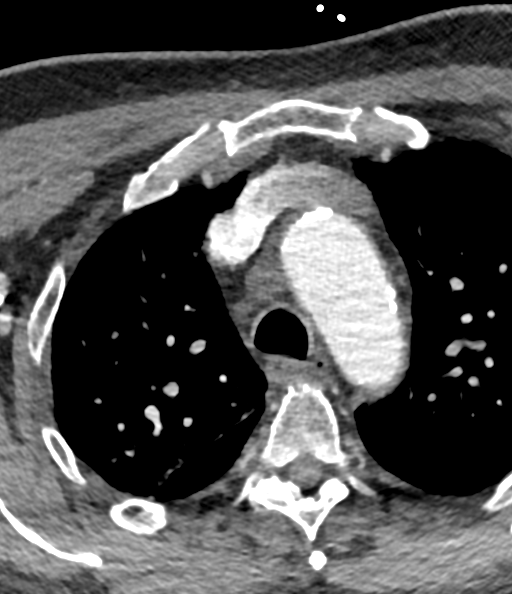
[im 81/282  bone]
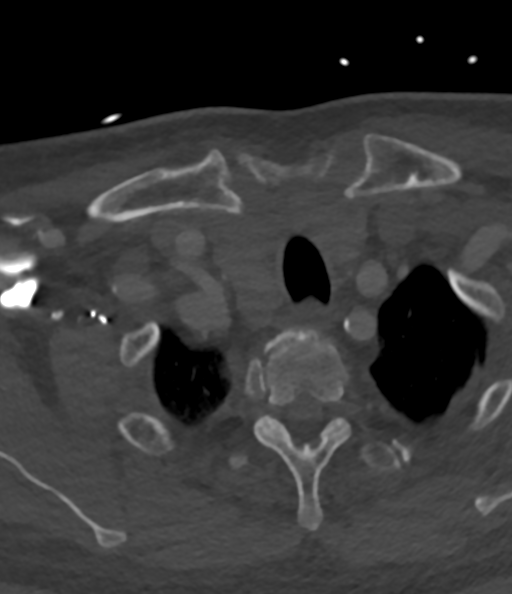
[im 121/282  soft-tissue]
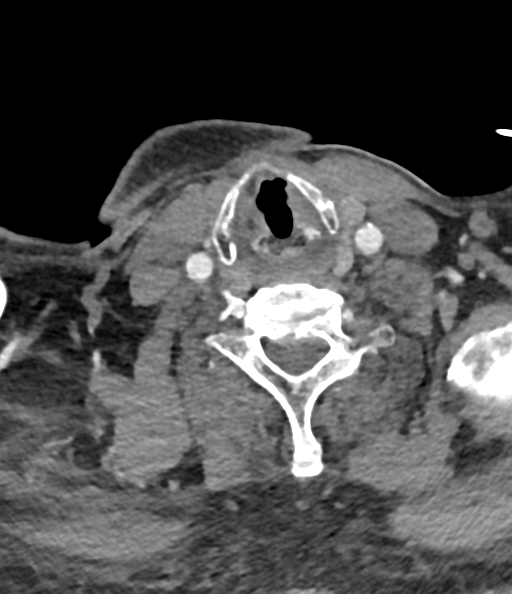
[im 161/282  bone]
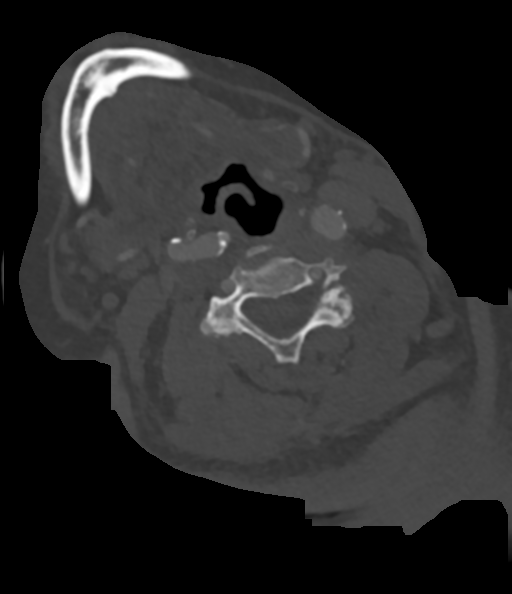
[im 201/282  soft-tissue]
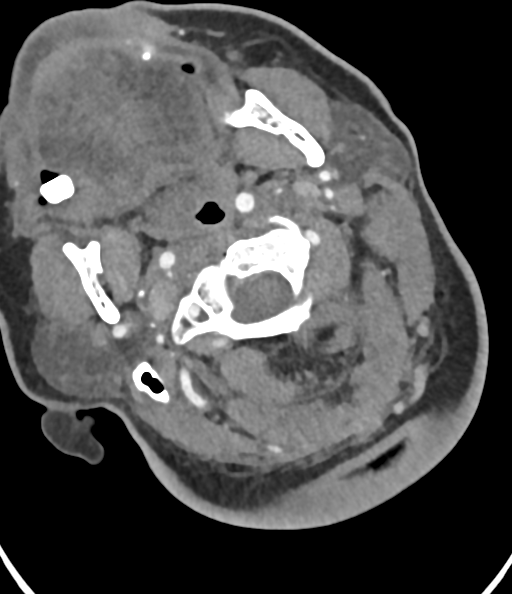
[im 241/282  bone]
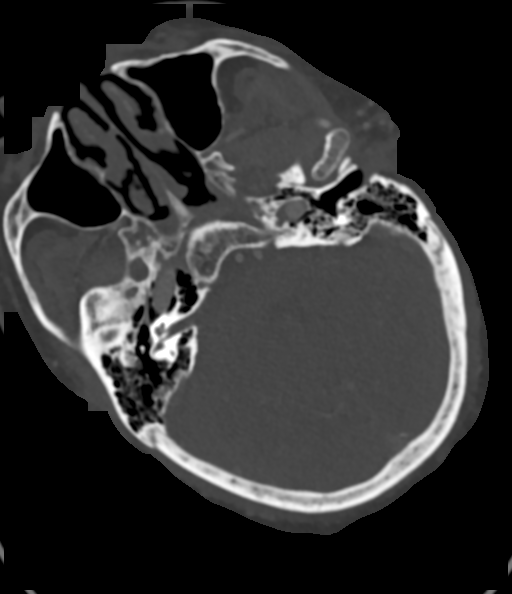

[8 of 33 positions shown; findings below may reference images not displayed]

FINDINGS: Aortic arch: Examination technically limited by positioning.

Visualized aortic arch normal in caliber with normal branch pattern.
Mild-to-moderate atheromatous change about the arch and origin of
the great vessels. No hemodynamically significant stenosis.

Right carotid system: Right common carotid artery patent from its
origin to the bifurcation without stenosis. Bulky eccentric plaque
at the right bifurcation/proximal right ICA with associated stenosis
of up to 65-70% by NASCET criteria. Right ICA tortuous but otherwise
patent to the skull base without stenosis, dissection or occlusion.

Left carotid system: Left CCA patent from its origin to the
bifurcation without stenosis. Scattered calcified plaque about the
left bifurcation/proximal left ICA with no more than mild 10-20%
stenosis by NASCET criteria. Left ICA tortuous but otherwise widely
patent to the skull base without stenosis, dissection or occlusion.

Vertebral arteries: Both vertebral arteries arise from the
subclavian arteries. No proximal subclavian artery stenosis. Mild
scattered plaque about the origins and proximal vertebral arteries
without hemodynamically significant stenosis. Vertebral arteries
patent within the neck without significant stenosis, dissection, or
occlusion.

Skeleton: No visible acute osseous abnormality, although evaluation
limited by positioning. No discrete or worrisome osseous lesions.
Mild multilevel cervical spondylosis, most pronounced at C5-6.

Other neck: No other acute soft tissue abnormality within the neck.
No visible mass or adenopathy.

Upper chest: Scattered atelectatic changes noted within the
visualized lungs. A degree of pulmonary interstitial congestion may
be present as well. No other definite acute abnormality.
IMPRESSION: 1. Bulky eccentric plaque at the right bifurcation/proximal right
ICA with associated stenosis of up to 65-70% by NASCET criteria.
2. Mild atheromatous change about the left carotid
bifurcation/proximal left ICA with no more than mild 10-20% stenosis
by NASCET criteria.
3. Wide patency of both vertebral arteries within the neck.
4. Diffuse tortuosity of the major arterial vasculature of the neck,
suggesting chronic underlying hypertension.

## 2019-11-16 MED ORDER — ISOSORBIDE MONONITRATE ER 60 MG PO TB24
120.0000 mg | ORAL_TABLET | Freq: Every day | ORAL | Status: DC
  Administered 2019-11-17 – 2019-11-28 (×10): 120 mg via ORAL
  Filled 2019-11-16: qty 2
  Filled 2019-11-16: qty 4
  Filled 2019-11-16: qty 2
  Filled 2019-11-16: qty 4
  Filled 2019-11-16 (×2): qty 2
  Filled 2019-11-16: qty 4
  Filled 2019-11-16 (×2): qty 2
  Filled 2019-11-16: qty 4
  Filled 2019-11-16 (×2): qty 2

## 2019-11-16 MED ORDER — ALUM & MAG HYDROXIDE-SIMETH 200-200-20 MG/5ML PO SUSP
15.0000 mL | ORAL | Status: DC | PRN
  Administered 2019-11-16: 15 mL via ORAL
  Filled 2019-11-16: qty 30

## 2019-11-16 MED ORDER — AMLODIPINE BESYLATE 10 MG PO TABS
10.0000 mg | ORAL_TABLET | Freq: Every day | ORAL | Status: DC
  Administered 2019-11-17 – 2019-11-28 (×10): 10 mg via ORAL
  Filled 2019-11-16 (×11): qty 1

## 2019-11-16 MED ORDER — IOHEXOL 350 MG/ML SOLN
100.0000 mL | Freq: Once | INTRAVENOUS | Status: AC | PRN
  Administered 2019-11-16: 100 mL via INTRAVENOUS

## 2019-11-16 MED ORDER — HYDRALAZINE HCL 20 MG/ML IJ SOLN
10.0000 mg | Freq: Four times a day (QID) | INTRAMUSCULAR | Status: DC | PRN
  Administered 2019-11-16 – 2019-11-17 (×2): 10 mg via INTRAVENOUS
  Filled 2019-11-16 (×2): qty 1

## 2019-11-16 MED ORDER — AMLODIPINE BESYLATE 5 MG PO TABS
5.0000 mg | ORAL_TABLET | Freq: Once | ORAL | Status: AC
  Administered 2019-11-16: 5 mg via ORAL
  Filled 2019-11-16: qty 1

## 2019-11-16 MED ORDER — AMLODIPINE BESYLATE 5 MG PO TABS
5.0000 mg | ORAL_TABLET | Freq: Every day | ORAL | Status: DC
  Administered 2019-11-16: 5 mg via ORAL
  Filled 2019-11-16 (×2): qty 1

## 2019-11-16 MED ORDER — INSULIN ASPART 100 UNIT/ML ~~LOC~~ SOLN
0.0000 [IU] | Freq: Four times a day (QID) | SUBCUTANEOUS | Status: DC
  Administered 2019-11-17 – 2019-11-22 (×12): 1 [IU] via SUBCUTANEOUS
  Administered 2019-11-23: 2 [IU] via SUBCUTANEOUS
  Administered 2019-11-23 – 2019-11-28 (×9): 1 [IU] via SUBCUTANEOUS
  Administered 2019-11-28: 2 [IU] via SUBCUTANEOUS

## 2019-11-16 MED ORDER — PANTOPRAZOLE SODIUM 40 MG PO TBEC
40.0000 mg | DELAYED_RELEASE_TABLET | Freq: Every day | ORAL | Status: DC
  Administered 2019-11-17 – 2019-11-28 (×10): 40 mg via ORAL
  Filled 2019-11-16 (×11): qty 1

## 2019-11-16 NOTE — Progress Notes (Signed)
Providing Compassionate, Quality Care - Together   Subjective: No acute events overnight. Pt vomited during rounds- coffee ground emesis, denies HA.   Objective: Vital signs in last 24 hours: Temp:  [98 F (36.7 C)-98.6 F (37 C)] 98.6 F (37 C) (09/24 0800) Pulse Rate:  [44-69] 61 (09/24 1300) Resp:  [12-24] 17 (09/24 1300) BP: (136-198)/(65-100) 154/75 (09/24 1300) SpO2:  [88 %-100 %] 99 % (09/24 1300)  Intake/Output from previous day: 09/23 0701 - 09/24 0700 In: 1414.6 [P.O.:240; I.V.:1174.6] Out: 693 [Urine:693] Intake/Output this shift: Total I/O In: 299.9 [I.V.:299.9] Out: -   Lab Results: Recent Labs    11/15/19 1143  WBC 15.1*  HGB 15.9  HCT 47.6  PLT 185   BMET Recent Labs    11/15/19 1143  NA 142  K 4.7  CL 107  CO2 24  GLUCOSE 121*  BUN 18  CREATININE 1.19  CALCIUM 9.9    Studies/Results: CT ANGIO HEAD W OR WO CONTRAST  Result Date: 11/14/2019 CLINICAL DATA:  Acute neuro deficit. Rule out stroke. Intracranial mass lesion. EXAM: CT ANGIOGRAPHY HEAD TECHNIQUE: Multidetector CT imaging of the head was performed using the standard protocol during bolus administration of intravenous contrast. Multiplanar CT image reconstructions and MIPs were obtained to evaluate the vascular anatomy. CONTRAST:  49mL OMNIPAQUE IOHEXOL 350 MG/ML SOLN COMPARISON:  MRI head 11/14/2019 FINDINGS: CT HEAD Brain: Large mass arising from the right cavernous sinus with peripheral calcification. This showed enhancement on MRI and is most consistent with a large meningioma. There is extension into the prepontine cistern on the right with mild mass-effect on the ventral pons on the right. There is surrounding white matter edema and mass-effect. 7 mm midline shift to the left unchanged. Tumor extends into the sella. Negative for acute infarct or hemorrhage. Patchy white matter hypodensity bilaterally most consistent with chronic microvascular ischemia as well as vasogenic edema from  the tumor on the right. Vascular: Atherosclerotic calcification in the distal vertebral and cavernous carotid arteries bilaterally. Negative for hyperdense vessel Skull: Negative Sinuses: Mild mucosal edema paranasal sinuses. Orbits: Bilateral proptosis.  No orbital mass. CTA HEAD Anterior circulation: Heavy calcification throughout the cavernous carotid bilaterally with mild stenosis bilaterally. Right cavernous sinus meningioma abuts the supraclinoid internal carotid artery without encasement or narrowing. Right middle cerebral artery is up lifted by tumor. Anterior and middle cerebral arteries patent bilaterally without stenosis or large vessel occlusion. Posterior circulation: Atherosclerotic calcification and mild stenosis distal vertebral artery bilaterally. MRI abnormality distal left vertebral artery corresponding to calcified plaque. No thrombus or filling defect. PICA patent bilaterally. Basilar is patent bilaterally. AICA patent bilaterally. The mass abuts the superior cerebellar artery on the right without occlusion. Posterior cerebral arteries patent bilaterally without occlusion or stenosis. Venous sinuses: Normal venous enhancement. Anatomic variants: None IMPRESSION: 1. Large meningioma right cavernous sinus extending into the prepontine cistern on the right. Mass enters the sella. 2. Extensive atherosclerotic calcification in the cavernous carotid bilaterally with mild stenosis. The tumor abuts the right cavernous and supraclinoid internal carotid artery without encasement or occlusion. 3. Mild calcific stenosis in the distal vertebral artery bilaterally. No thrombus identified in the left vertebral artery as question on MRI. Electronically Signed   By: Franchot Gallo M.D.   On: 11/14/2019 21:35   CT HEAD WO CONTRAST  Result Date: 11/16/2019 CLINICAL DATA:  Stroke follow-up EXAM: CT HEAD WITHOUT CONTRAST TECHNIQUE: Contiguous axial images were obtained from the base of the skull through the  vertex without intravenous contrast.  COMPARISON:  CT head 11/14/2019.  MRI head 11/14/2019 FINDINGS: Brain: Hypodensity in the right frontal parietal cortex compatible with acute infarct as noted on MRI. This extends into the right occipital lobe. No associated hemorrhage. Chronic microvascular ischemic changes in the white matter. 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 This extends into the prepontine cistern with mild mass-effect on the pons on the right. There is surrounding vasogenic edema in the right temporoparietal lobe. Vascular: Negative for hyperdense vessel Skull: Negative Sinuses/Orbits: Mild mucosal edema paranasal sinuses. Negative orbit. Other: None IMPRESSION: Acute infarct right MCA territory in the right frontal parietal lobe extending into the occipital lobe is unchanged. No acute hemorrhage Large right cavernous sinus meningioma unchanged. There is vasogenic edema in the right temporoparietal lobe. 9 mm midline shift to the left unchanged. Electronically Signed   By: Franchot Gallo M.D.   On: 11/16/2019 11:38   CT ANGIO NECK W OR WO CONTRAST  Result Date: 11/16/2019 CLINICAL DATA:  Follow-up examination for acute stroke. EXAM: CT ANGIOGRAPHY NECK TECHNIQUE: Multidetector CT imaging of the neck was performed using the standard protocol during bolus administration of intravenous contrast. Multiplanar CT image reconstructions and MIPs were obtained to evaluate the vascular anatomy. Carotid stenosis measurements (when applicable) are obtained utilizing NASCET criteria, using the distal internal carotid diameter as the denominator. CONTRAST:  15mL OMNIPAQUE IOHEXOL 350 MG/ML SOLN COMPARISON:  Prior CTA from 11/14/2019. FINDINGS: Aortic arch: Examination technically limited by positioning. Visualized aortic arch normal in caliber with normal branch pattern. Mild-to-moderate atheromatous change about the arch and origin of the great vessels. No hemodynamically significant  stenosis. Right carotid system: Right common carotid artery patent from its origin to the bifurcation without stenosis. Bulky eccentric plaque at the right bifurcation/proximal right ICA with associated stenosis of up to 65-70% by NASCET criteria. Right ICA tortuous but otherwise patent to the skull base without stenosis, dissection or occlusion. Left carotid system: Left CCA patent from its origin to the bifurcation without stenosis. Scattered calcified plaque about the left bifurcation/proximal left ICA with no more than mild 10-20% stenosis by NASCET criteria. Left ICA tortuous but otherwise widely patent to the skull base without stenosis, dissection or occlusion. Vertebral arteries: Both vertebral arteries arise from the subclavian arteries. No proximal subclavian artery stenosis. Mild scattered plaque about the origins and proximal vertebral arteries without hemodynamically significant stenosis. Vertebral arteries patent within the neck without significant stenosis, dissection, or occlusion. Skeleton: No visible acute osseous abnormality, although evaluation limited by positioning. No discrete or worrisome osseous lesions. Mild multilevel cervical spondylosis, most pronounced at C5-6. Other neck: No other acute soft tissue abnormality within the neck. No visible mass or adenopathy. Upper chest: Scattered atelectatic changes noted within the visualized lungs. A degree of pulmonary interstitial congestion may be present as well. No other definite acute abnormality. IMPRESSION: 1. Bulky eccentric plaque at the right bifurcation/proximal right ICA with associated stenosis of up to 65-70% by NASCET criteria. 2. Mild atheromatous change about the left carotid bifurcation/proximal left ICA with no more than mild 10-20% stenosis by NASCET criteria. 3. Wide patency of both vertebral arteries within the neck. 4. Diffuse tortuosity of the major arterial vasculature of the neck, suggesting chronic underlying hypertension.  Electronically Signed   By: Jeannine Boga M.D.   On: 11/16/2019 02:05   ECHOCARDIOGRAM COMPLETE  Result Date: 11/16/2019    ECHOCARDIOGRAM REPORT   Patient Name:   Charles [facility] Date of Exam: 11/16/2019 Medical Rec #:  [id number]  Height:       71.0 in Accession #:    [id number]            Weight:       262.4 lb Date of Birth:  August 22, 1945              BSA:          2.367 m Patient Age:    20 years              BP:           191/98 mmHg Patient Gender: M                     HR:           52 bpm. Exam Location:  Inpatient Procedure: 2D Echo, Cardiac Doppler and Color Doppler Indications:    CVA  History:        Patient has prior history of Echocardiogram examinations, most                 recent 03/28/2019. CHF, Previous Myocardial Infarction, COPD; Risk                 Factors:Current Smoker and Hypertension.  Sonographer:    Dustin Flock Referring Phys: [id number] [place]  1. There is a small calcified mass attached to the anterior MV leaflet. It is moves with the leaflet. It measures 0.4 cm x 0.7 cm. It is best seen in the PLAX view. 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 There is no significant regurgitation or destruction of the valve to suggest endocarditis. 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 Would recommend a TEE for better characterization in the setting of recent stroke. The mitral valve is degenerative. Trivial mitral valve regurgitation. No evidence of mitral stenosis.  2. Left ventricular ejection fraction, by estimation, is 60 to 65%. The left ventricle has normal function. The left ventricle has no regional wall motion abnormalities. There is mild concentric left ventricular hypertrophy. Left ventricular diastolic parameters are consistent with Grade I diastolic dysfunction (impaired relaxation).  3. Right ventricular systolic function is normal. The right ventricular size is normal. Tricuspid regurgitation  signal is inadequate for assessing PA pressure.  4. Left atrial size was mildly dilated.  5. The aortic valve is tricuspid. There is mild calcification of the aortic valve. There is mild thickening of the aortic valve. Aortic valve regurgitation is not visualized. Mild aortic valve sclerosis is present, with no evidence of aortic valve stenosis.  6. The inferior vena cava is normal in size with greater than 50% respiratory variability, suggesting right atrial pressure of 3 mmHg. Conclusion(s)/Recommendation(s): Findings concerning for MV mass, would recommend Transesophageal Echocardiogram for clarification. FINDINGS  Left Ventricle: Left ventricular ejection fraction, by estimation, is 60 to 65%. The left ventricle has normal function. The left ventricle has no regional wall motion abnormalities. The left ventricular internal cavity size was normal in size. There is  mild concentric left ventricular hypertrophy. Left ventricular diastolic parameters are consistent with Grade I diastolic dysfunction (impaired relaxation). Normal left ventricular filling pressure. Right Ventricle: The right ventricular size is normal. No increase in right ventricular wall thickness. Right ventricular systolic function is normal. Tricuspid regurgitation signal is inadequate for assessing PA pressure. Left Atrium: Left atrial size was mildly dilated. Right Atrium: Right atrial size was normal in size. Pericardium: Trivial pericardial effusion is present. Mitral Valve: There is a small calcified mass  attached to the anterior MV leaflet. It is moves with the leaflet. It measures 0.4 cm x 0.7 cm. It is best seen in the PLAX view. 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 There is no significant regurgitation or destruction of the valve to suggest endocarditis. 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 Would recommend a TEE for better characterization in the setting of recent stroke. The mitral  valve is degenerative in appearance. There is mild calcification of the anterior and posterior mitral valve leaflet(s). Trivial mitral valve regurgitation. No evidence of mitral valve stenosis. Tricuspid Valve: The tricuspid valve is grossly normal. Tricuspid valve regurgitation is trivial. No evidence of tricuspid stenosis. Aortic Valve: The aortic valve is tricuspid. There is mild calcification of the aortic valve. There is mild thickening of the aortic valve. Aortic valve regurgitation is not visualized. Mild aortic valve sclerosis is present, with no evidence of aortic valve stenosis. Pulmonic Valve: The pulmonic valve was grossly normal. Pulmonic valve regurgitation is not visualized. No evidence of pulmonic stenosis. Aorta: The aortic root is normal in size and structure. Venous: The inferior vena cava is normal in size with greater than 50% respiratory variability, suggesting right atrial pressure of 3 mmHg. IAS/Shunts: The atrial septum is grossly normal.  LEFT VENTRICLE PLAX 2D LVIDd:         4.50 cm  Diastology LVIDs:         3.10 cm  LV e' medial:    5.87 cm/s LV PW:         1.20 cm  LV E/e' medial:  10.9 LV IVS:        1.20 cm  LV e' lateral:   7.72 cm/s LVOT diam:     2.50 cm  LV E/e' lateral: 8.3 LV SV:         135 LV SV Index:   57 LVOT Area:     4.91 cm  RIGHT VENTRICLE RV Basal diam:  3.40 cm RV S prime:     18.00 cm/s TAPSE (M-mode): 3.8 cm LEFT ATRIUM           Index       RIGHT ATRIUM           Index LA diam:      4.90 cm 2.07 cm/m  RA Area:     21.30 cm LA Vol (A2C): 61.8 ml 26.11 ml/m RA Volume:   61.30 ml  25.90 ml/m LA Vol (A4C): 96.9 ml 40.95 ml/m  AORTIC VALVE LVOT Vmax:   110.00 cm/s LVOT Vmean:  72.600 cm/s LVOT VTI:    0.275 m  AORTA Ao Root diam: 3.40 cm MITRAL VALVE MV Area (PHT): 3.72 cm    SHUNTS MV Decel Time: 204 msec    Systemic VTI:  0.28 m MV E velocity: 63.80 cm/s  Systemic Diam: 2.50 cm MV A velocity: 55.30 cm/s MV E/A ratio:  1.15 Eleonore Chiquito MD Electronically signed  by Eleonore Chiquito MD Signature Date/Time: 11/16/2019/11:52:00 AM    Final    VAS US CAROTID  Result Date: 11/15/2019 Carotid Arterial Duplex Study Indications:  TIA. Risk Factors: Hypertension, hyperlipidemia. Performing Technologist: Griffin Basil RCT RDMS  Examination Guidelines: A complete evaluation includes B-mode imaging, spectral Doppler, color Doppler, and power Doppler as needed of all accessible portions of each vessel. Bilateral testing is considered an integral part of a complete examination. Limited examinations for reoccurring indications may be performed as noted.  Right Carotid Findings: +----------+--------+--------+--------+------------------+---------------------+  PSV cm/sEDV cm/sStenosisPlaque DescriptionComments              +----------+--------+--------+--------+------------------+---------------------+ CCA Prox  73      14                                                      +----------+--------+--------+--------+------------------+---------------------+ CCA Distal53      8                                 intimal thickening    +----------+--------+--------+--------+------------------+---------------------+ ICA Prox                                            limited evaluation                                                        due to extensive                                                          calcified plaque      +----------+--------+--------+--------+------------------+---------------------+ ECA       105     15                                                      +----------+--------+--------+--------+------------------+---------------------+ +----------+--------+-------+--------+-------------------+           PSV cm/sEDV cmsDescribeArm Pressure (mmHG) +----------+--------+-------+--------+-------------------+ Subclavian115                                         +----------+--------+-------+--------+-------------------+ +---------+--------+--+--------+--+---------+ VertebralPSV cm/s60EDV cm/s16Antegrade +---------+--------+--+--------+--+---------+  Left Carotid Findings: +----------+--------+--------+--------+------------------+------------------+           PSV cm/sEDV cm/sStenosisPlaque DescriptionComments           +----------+--------+--------+--------+------------------+------------------+ CCA Prox  78      10                                                   +----------+--------+--------+--------+------------------+------------------+ CCA Distal56      13                                intimal thickening +----------+--------+--------+--------+------------------+------------------+ ICA Prox  92      24      1-39%                     intimal thickening +----------+--------+--------+--------+------------------+------------------+  ICA Distal109     17                                                   +----------+--------+--------+--------+------------------+------------------+ ECA       100     6                                                    +----------+--------+--------+--------+------------------+------------------+ +----------+--------+--------+--------+-------------------+           PSV cm/sEDV cm/sDescribeArm Pressure (mmHG) +----------+--------+--------+--------+-------------------+ [id number]     2                                   +----------+--------+--------+--------+-------------------+ +---------+--------+--+--------+--+---------+ VertebralPSV cm/s43EDV cm/s12Antegrade +---------+--------+--+--------+--+---------+   Summary: Right         Non Diagnostic Right Ica evaluation due to extensive calcified Carotid:      plaque. Left Carotid: Velocities in the left ICA are consistent with a 1-39% stenosis. Vertebrals: Bilateral vertebral arteries demonstrate antegrade flow. *See table(s)  above for measurements and observations.  Electronically signed by Servando Snare MD on 11/15/2019 at 7:10:45 PM.    Final     Assessment/Plan: 73 year old gentleman with known right medial sphenoid wing meningioma scheduled for routine outpatient follow-up after an MRI scan on the 15th. On 9/22- patient presented to the outside hospital emergency room with left-sided weakness, work-up is revealed multiple acute infarcts right MCA distribution.    Alert and oriented x3, lethargic but follow commands. Pt able answer simple questions, one -two words answers. Still has left sided weakness, LUE 0/5, LLE 3/5.  RUE and RLE 5/5. Gait is not tested  1. Meningioma -currently not a surgical candidate given acute stroke -On decadron 4 mg q6 IV   2. Stroke:Carotid stenosis: Right MCA and ACA infarcts and right MCA narrowing due to large R meningioma.  F/U with neurology vascular consults -Echo pending -CTA head 11/16/19- Bulky eccentric plaque at the right bifurcation/proximal right ICA with associated stenosis of up to 65-70% by NASCET criteria. Mild atheromatous change about the left carotid bifurcation/proximal left ICA with no more than mild 10-20% stenosis by NASCET criteria. -DAPT: aspirin 325 mg daily and plavix 75 daily  3.Hypertension -On amlodipine 10, metoprolol 25 bid, lasix 40 bid and imdur 120 daily -Okay if blood pressure <180/105, hydralazine 10 PRN if BP <180/105  4. Vasogenic edema -vasogenic edema due to tumor -On decadron IV -repeat CT head w/o contrast-pending -vomited could due to vasogenic edema- added Maalox and PPI   LOS: 2 days      Osie Cheeks 11/16/2019, 1:12 PM

## 2019-11-16 NOTE — Progress Notes (Signed)
STROKE TEAM PROGRESS NOTE   INTERVAL HISTORY RN is at the bedside. Pt eyes open, orientated x 3. However, still has left sided weakness. He has been vomiting this am, denies HA, will repeat head CT per Dr. Ellene Route. On decadron IV, will check CBGs. BP on the high end, increased norvasc, continue metoprolol. On hydralazine PRN  Vitals:   11/16/19 0817 11/16/19 0850 11/16/19 0900 11/16/19 1000  BP: (!) 193/93 (!) 169/80 (!) 159/88 (!) 191/98  Pulse: (!) 58 (!) 51 (!) 50 (!) 52  Resp: 19 16 16 20   Temp:      TempSrc:      SpO2: 98% 98% 98% 100%   CBC:  Recent Labs  Lab 11/15/19 1143  WBC 15.1*  HGB 15.9  HCT 47.6  MCV 96.6  PLT 834   Basic Metabolic Panel:  Recent Labs  Lab 11/15/19 1143  NA 142  K 4.7  CL 107  CO2 24  GLUCOSE 121*  BUN 18  CREATININE 1.19  CALCIUM 9.9   Lipid Panel:  Recent Labs  Lab 11/15/19 1143  CHOL 189  TRIG 54  HDL 64  CHOLHDL 3.0  VLDL 11  LDLCALC 114*   HgbA1c:  Recent Labs  Lab 11/15/19 1143  HGBA1C 5.4   Urine Drug Screen: No results for input(s): LABOPIA, COCAINSCRNUR, LABBENZ, AMPHETMU, THCU, LABBARB in the last 168 hours.  Alcohol Level No results for input(s): ETH in the last 168 hours.  IMAGING past 24 hours CT ANGIO NECK W OR WO CONTRAST  Result Date: 11/16/2019 CLINICAL DATA:  Follow-up examination for acute stroke. EXAM: CT ANGIOGRAPHY NECK TECHNIQUE: Multidetector CT imaging of the neck was performed using the standard protocol during bolus administration of intravenous contrast. Multiplanar CT image reconstructions and MIPs were obtained to evaluate the vascular anatomy. Carotid stenosis measurements (when applicable) are obtained utilizing NASCET criteria, using the distal internal carotid diameter as the denominator. CONTRAST:  158mL OMNIPAQUE IOHEXOL 350 MG/ML SOLN COMPARISON:  Prior CTA from 11/14/2019. FINDINGS: Aortic arch: Examination technically limited by positioning. Visualized aortic arch normal in caliber with  normal branch pattern. Mild-to-moderate atheromatous change about the arch and origin of the great vessels. No hemodynamically significant stenosis. Right carotid system: Right common carotid artery patent from its origin to the bifurcation without stenosis. Bulky eccentric plaque at the right bifurcation/proximal right ICA with associated stenosis of up to 65-70% by NASCET criteria. Right ICA tortuous but otherwise patent to the skull base without stenosis, dissection or occlusion. Left carotid system: Left CCA patent from its origin to the bifurcation without stenosis. Scattered calcified plaque about the left bifurcation/proximal left ICA with no more than mild 10-20% stenosis by NASCET criteria. Left ICA tortuous but otherwise widely patent to the skull base without stenosis, dissection or occlusion. Vertebral arteries: Both vertebral arteries arise from the subclavian arteries. No proximal subclavian artery stenosis. Mild scattered plaque about the origins and proximal vertebral arteries without hemodynamically significant stenosis. Vertebral arteries patent within the neck without significant stenosis, dissection, or occlusion. Skeleton: No visible acute osseous abnormality, although evaluation limited by positioning. No discrete or worrisome osseous lesions. Mild multilevel cervical spondylosis, most pronounced at C5-6. Other neck: No other acute soft tissue abnormality within the neck. No visible mass or adenopathy. Upper chest: Scattered atelectatic changes noted within the visualized lungs. A degree of pulmonary interstitial congestion may be present as well. No other definite acute abnormality. IMPRESSION: 1. Bulky eccentric plaque at the right bifurcation/proximal right ICA with associated stenosis of  up to 65-70% by NASCET criteria. 2. Mild atheromatous change about the left carotid bifurcation/proximal left ICA with no more than mild 10-20% stenosis by NASCET criteria. 3. Wide patency of both vertebral  arteries within the neck. 4. Diffuse tortuosity of the major arterial vasculature of the neck, suggesting chronic underlying hypertension. Electronically Signed   By: Jeannine Boga M.D.   On: 11/16/2019 02:05   VAS US CAROTID  Result Date: 11/15/2019 Carotid Arterial Duplex Study Indications:  TIA. Risk Factors: Hypertension, hyperlipidemia. Performing Technologist: Griffin Basil RCT RDMS  Examination Guidelines: A complete evaluation includes B-mode imaging, spectral Doppler, color Doppler, and power Doppler as needed of all accessible portions of each vessel. Bilateral testing is considered an integral part of a complete examination. Limited examinations for reoccurring indications may be performed as noted.  Right Carotid Findings: +----------+--------+--------+--------+------------------+---------------------+           PSV cm/sEDV cm/sStenosisPlaque DescriptionComments              +----------+--------+--------+--------+------------------+---------------------+ CCA Prox  73      14                                                      +----------+--------+--------+--------+------------------+---------------------+ CCA Distal53      8                                 intimal thickening    +----------+--------+--------+--------+------------------+---------------------+ ICA Prox                                            limited evaluation                                                        due to extensive                                                          calcified plaque      +----------+--------+--------+--------+------------------+---------------------+ ECA       105     15                                                      +----------+--------+--------+--------+------------------+---------------------+ +----------+--------+-------+--------+-------------------+           PSV cm/sEDV cmsDescribeArm Pressure (mmHG)  +----------+--------+-------+--------+-------------------+ Subclavian115                                        +----------+--------+-------+--------+-------------------+ +---------+--------+--+--------+--+---------+ VertebralPSV cm/s60EDV cm/s16Antegrade +---------+--------+--+--------+--+---------+  Left Carotid Findings: +----------+--------+--------+--------+------------------+------------------+           PSV cm/sEDV  cm/sStenosisPlaque DescriptionComments           +----------+--------+--------+--------+------------------+------------------+ CCA Prox  78      10                                                   +----------+--------+--------+--------+------------------+------------------+ CCA Distal56      13                                intimal thickening +----------+--------+--------+--------+------------------+------------------+ ICA Prox  92      24      1-39%                     intimal thickening +----------+--------+--------+--------+------------------+------------------+ ICA Distal109     17                                                   +----------+--------+--------+--------+------------------+------------------+ ECA       100     6                                                    +----------+--------+--------+--------+------------------+------------------+ +----------+--------+--------+--------+-------------------+           PSV cm/sEDV cm/sDescribeArm Pressure (mmHG) +----------+--------+--------+--------+-------------------+ [id number]     2                                   +----------+--------+--------+--------+-------------------+ +---------+--------+--+--------+--+---------+ VertebralPSV cm/s43EDV cm/s12Antegrade +---------+--------+--+--------+--+---------+   Summary: Right         Non Diagnostic Right Ica evaluation due to extensive calcified Carotid:      plaque. Left Carotid: Velocities in the left ICA are  consistent with a 1-39% stenosis. Vertebrals: Bilateral vertebral arteries demonstrate antegrade flow. *See table(s) above for measurements and observations.  Electronically signed by Servando Snare MD on 11/15/2019 at 7:10:45 PM.    Final     PHYSICAL EXAM  Temp:  [97.7 F (36.5 C)-98.6 F (37 C)] 98.6 F (37 C) (09/24 0800) Pulse Rate:  [44-69] 52 (09/24 1000) Resp:  [12-24] 20 (09/24 1000) BP: (158-198)/(69-100) 191/98 (09/24 1000) SpO2:  [88 %-100 %] 100 % (09/24 1000)  General - Well nourished, well developed, lethargic.  Ophthalmologic - fundi not visualized due to noncooperation.  Cardiovascular - Regular rhythm and rate.  Neuro - lethargic but eyes open, able to follow up commands on the right. Orientated to age, time and place. Paucity of speech, no aphasia, able to name and repeat simple sentences. Right gaze preference, able to cross midline, left gaze incomplete. Eyes conjugate, but limited upper and lower gaze. 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 Left able to count fingers, intact pupillary reflex. Left facial droop. Tongue midline. RUE 5/5, RLE 4/5. However, LUE 0/5 but increased muscle tone. LLE 3-/5 proximal and 3/5 distal DF. Sensation symmetrical per pt, right FTN intact. Gait not tested.   ASSESSMENT/PLAN Charles Sherman is a 74  y.o. male with history of COPD, CHF, HTN, MI and OSA presenting to [facility] 9/22 with L facial droop, L sided weakness and dysarthria. Known meningioma from 05/2019 with R CRAO, followed by NS w/ scheduled appt for 9/23.  Stroke: Scattered R MCA and ACA infarcts in setting of right ICA bulb stenosis and siphon extensive atherosclerosis and right MCA narrowing due to large R meningioma. Less likely due to embolic source  MRI  [facility]) 9/22 R MCA and ACA scattered infarcts. Elevation of R MCA d/t mass effect. R middle cranial fossa meningioma involving R cavernous sinus and sella turcica  unchanged w/ 50mm L midline shift.  Vasogenic edema on the right brain.  CTA head large R cavernous sinus meningioma extending into prepontine cistern on the R, mass enters the sella. Extensive atherosclerotic calcification B cavernous ICA and supraclinoid ICA. Distal B VA mild stenoses. Right middle cerebral artery is up lifted by tumor.  Carotid Doppler nondiagnostic of right ICA due to extensive plaque  CTA neck Bulky eccentric plaque at the right bifurcation/proximal right ICA with associated stenosis of up to 65-70%  2D Echo pending  LDL 114  HgbA1c 5.4  VTE prophylaxis - lovenox   aspirin 81 mg daily prior to admission, now on aspirin 325 mg daily and plavix DAPT for 3 months and then plavix alone.   Therapy recommendations:  pending   Disposition:  pending   Meningioma, large  Large R cavernous area extending into prepontine cistern and sella w/ upward displacement of R MCA  Previously not a surgical candidate given R CRAO at time of dx  Currently not a surgical candidate given acute stroke  NS following for possible future removal  Vasogenic edema on Decadron  Carotid stenosis Hx of R CRAO  05/2019 right eye vision loss  02/2019 CUS Bilateral carotid atherosclerosis. No hemodynamically significant ICA stenosis.   This admission CUS nondiagnostic of right ICA due to extensive plaque  CTA neck Bulky eccentric plaque at the right bifurcation/proximal right ICA with associated stenosis of up to 65-70%  May need to consider right CEA depends on future treatment plan.   Vasogenic edema  CT and MRI showed right brain vasogenic edema due to tumor  On decadron IV  Put on CBG and SSI  Vomited this am - repeat CT pending  Hypertension  Home meds:  HCTZ 25 tid, imdur 120, amlodipine 10, lasix 80 bid, metoprolol 25 bid  Permissive hypertension (OK if < 180/105) but gradually normalize in 3-5 days  On amlodipine 10, metoprolol 25 bid, lasix 40 bid and imdur 120  daily . On hydralazine PRN . Long-term BP goal 130-150 given ICA siphon extensive athero  Hyperlipidemia  Home meds:  lipitor 40, resumed in hospital  LDL 114, goal < 70  Increase lipitor to 80  Continue statin at discharge  Tobacco abuse  Current smoker  Smoking cessation counseling provided  Pt is willing to quit     Other Stroke Risk Factors  Advanced age  Obesity, recommend weight loss, diet and exercise as appropriate   Coronary artery disease, hx MI w/ PCI x 3  Obstructive sleep apnea, on CPAP at home  Chronic diastolic Congestive heart failure  Other Active Problems  Leukocytosis WBC 8.3->15.1  COPD  Hospital day # 2  This patient is critically ill due to brain tumor with significant vasogenic edema, vomiting, large right MCA stroke with right ICA stenosis at multiple segments, hypertensive emergency, leukocytosis and at significant risk of neurological  worsening, death form brain herniation, recurrent stroke, hemorrhagic conversion, carotid occlusion, aspiration, seizure. This patient's care requires constant monitoring of vital signs, hemodynamics, respiratory and cardiac monitoring, review of multiple databases, neurological assessment, discussion with family, other specialists and medical decision making of high complexity. I spent 40 minutes of neurocritical care time in the care of this patient.   Charles Hawking, MD PhD Stroke Neurology 11/16/2019 11:02 AM    To contact Stroke Continuity provider, please refer to [email or web address] After hours, contact General Neurology

## 2019-11-16 NOTE — Progress Notes (Signed)
  Echocardiogram 2D Echocardiogram has been performed.  Charles Sherman 11/16/2019, 10:48 AM

## 2019-11-16 NOTE — Progress Notes (Signed)
Physical Therapy Treatment Patient Details Name: Charles Sherman MRN: [id number] DOB: 20-Apr-1945 Today's Date: 11/16/2019    History of Present Illness 74 year old gentleman with known right medial sphenoid wing meningioma scheduled for routine outpatient follow-up after an MRI scan on the 15th.  However patient presented to the outside hospital emergency room with left-sided weakness last night work-up is revealed multiple acute infarcts right MCA distribution. PMH includes CHF, COPD, HTN, MI, OSA.    PT Comments    Pt tolerates treatment well. Pt progresses to transferring out of bed with significant assistance of PT/OT. Pt continues to demonstrate L lateral lean preference and L inattention. Pt now with increased tone in L side, utilizing tone during standing to support LLE and prevent buckling. Pt continues to demonstrate poor awareness of deficits and poor safety awareness at this time. Pt demonstrates improved sitting balance with RUE support, better able to correct for left lateral lean. Pt will benefit from continued acute PT POC to reduce falls risk and caregiver burden. PT continues to recommend CIR as the pt demonstrates the potential to make significant functional gains with continued high intensity inpatient PT services.   Follow Up Recommendations  CIR     Equipment Recommendations  Wheelchair (measurements PT);Wheelchair cushion (measurements PT);Hospital bed (mechanical lift)    Recommendations for Other Services       Precautions / Restrictions Precautions Precautions: Fall Restrictions Weight Bearing Restrictions: No    Mobility  Bed Mobility Overal bed mobility: Needs Assistance Bed Mobility: Supine to Sit     Supine to sit: Max assist;+2 for physical assistance;HOB elevated        Transfers Overall transfer level: Needs assistance Equipment used: 2 person hand held assist Transfers: Sit to/from [employer] Transfers Sit to Stand: Max assist;+2  physical assistance Stand pivot transfers: Max assist;+2 physical assistance       General transfer comment: pt requires significant assistance and tactile cueing due to left lateral lean. Pt with noted L knee and LE hyperextension due to increased tone when standing. Pt unable to lift LLE to advance, instead drags LE with PT/OT facilitation of weight shift and pivot  Ambulation/Gait                 Stairs             Wheelchair Mobility    Modified Rankin (Stroke Patients Only) Modified Rankin (Stroke Patients Only) Pre-Morbid Rankin Score: No symptoms Modified Rankin: Severe disability     Balance Overall balance assessment: Needs assistance Sitting-balance support: Single extremity supported;Bilateral upper extremity supported;Feet supported Sitting balance-Leahy Scale: Poor Sitting balance - Comments: minA with UE support, maxA without UE support due to left lean Postural control: Left lateral lean;Posterior lean Standing balance support: Bilateral upper extremity supported Standing balance-Leahy Scale: Zero Standing balance comment: maxA x2, left lateral lean                            Cognition Arousal/Alertness: Awake/alert Behavior During Therapy: Flat affect Overall Cognitive Status: Impaired/Different from baseline Area of Impairment: Attention;Memory;Following commands;Safety/judgement;Awareness;Problem solving                   Current Attention Level: Sustained Memory: Decreased recall of precautions;Decreased short-term memory Following Commands: Follows one step commands with increased time Safety/Judgement: Decreased awareness of safety;Decreased awareness of deficits Awareness: Intellectual Problem Solving: Slow processing;Decreased initiation;Difficulty sequencing;Requires verbal cues;Requires tactile cues        Exercises  General Comments General comments (skin integrity, edema, etc.): pt on RA during session,  VSS      Pertinent Vitals/Pain Pain Assessment: Faces Faces Pain Scale: No hurt    Home Living                      Prior Function            PT Goals (current goals can now be found in the care plan section) Acute Rehab PT Goals Patient Stated Goal: To improve mobility quality Progress towards PT goals: Progressing toward goals    Frequency    Min 4X/week      PT Plan Current plan remains appropriate    Co-evaluation PT/OT/SLP Co-Evaluation/Treatment: Yes Reason for Co-Treatment: Complexity of the patient's impairments (multi-system involvement);Necessary to address cognition/behavior during functional activity;For patient/therapist safety;To address functional/ADL transfers PT goals addressed during session: Mobility/safety with mobility;Balance;Strengthening/ROM        AM-PAC PT "6 Clicks" Mobility   Outcome Measure  Help needed turning from your back to your side while in a flat bed without using bedrails?: A Lot Help needed moving from lying on your back to sitting on the side of a flat bed without using bedrails?: Total Help needed moving to and from a bed to a chair (including a wheelchair)?: Total Help needed standing up from a chair using your arms (e.g., wheelchair or bedside chair)?: Total Help needed to walk in hospital room?: Total Help needed climbing 3-5 steps with a railing? : Total 6 Click Score: 7    End of Session Equipment Utilized During Treatment: Gait belt Activity Tolerance: Patient tolerated treatment well Patient left: in chair;with call bell/phone within reach;with chair alarm set Nurse Communication: Mobility status;Need for lift equipment PT Visit Diagnosis: Unsteadiness on feet (R26.81);Other abnormalities of gait and mobility (R26.89);Muscle weakness (generalized) (M62.81);Other symptoms and signs involving the nervous system (R29.898);Hemiplegia and hemiparesis Hemiplegia - Right/Left: Left Hemiplegia -  dominant/non-dominant: Non-dominant Hemiplegia - caused by: Cerebral infarction     Time: 6712-4580 PT Time Calculation (min) (ACUTE ONLY): 32 min  Charges:  $Therapeutic Activity: 8-22 mins                     Zenaida Niece, PT, DPT Acute Rehabilitation Pager: [phone]    Zenaida Niece 11/16/2019, 1:47 PM

## 2019-11-16 NOTE — Evaluation (Signed)
Speech Language Pathology Evaluation Patient Details Name: Charles Sherman MRN: [id number] DOB: Nov 02, 1945 Today's Date: 11/16/2019 Time: 0910-0920 SLP Time Calculation (min) (ACUTE ONLY): 10 min  Problem List:  Patient Active Problem List   Diagnosis Date Noted  . Cerebral thrombosis with cerebral infarction 11/15/2019  . Meningioma ([place]) 11/14/2019   Past Medical History:  Past Medical History:  Diagnosis Date  . CHF (congestive heart failure) ([place])   . COPD (chronic obstructive pulmonary disease) ([place])   . HTN (hypertension)   . Myocardial infarct ([place])   . OSA (obstructive sleep apnea)    on CPAP   Past Surgical History:  Past Surgical History:  Procedure Laterality Date  . PERCUTANEOUS CORONARY STENT INTERVENTION (PCI-S)     HPI:  Pt is a 74 y.o. 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 PMH also includes: OSA, MI, HTN, COPD, CHF   Assessment / Plan / Recommendation Clinical Impression  Pt has a moderate dysarthria with articulation likely impacted by CN VII and XII given impairments noted during oral motor exam. He does not have a lot of spontaneous verbal output, but when responding to questions, his expressive language appears to be fluent. His sustained attention is better during functional tasks compared to simple verbal ones, but he also has increased lethargy today. His intellectual awareness and safety awareness are impaired as is his short-term memory (4/4 words during immediate recall; 2/4 after a brief delay). He would benefit from ongoing SLP services to maximize cognitive and communicative function.     SLP Assessment  SLP Recommendation/Assessment: Patient needs continued Speech Lanaguage Pathology Services SLP Visit Diagnosis: Dysarthria and anarthria (R47.1);Cognitive communication deficit (R41.841)    Follow Up Recommendations  Inpatient Rehab     Frequency and Duration min 2x/week  2 weeks      SLP Evaluation Cognition  Overall Cognitive Status: Impaired/Different from baseline Arousal/Alertness: Lethargic Orientation Level: Oriented X4 Attention: Sustained Sustained Attention: Impaired Sustained Attention Impairment: Verbal basic Memory: Impaired Memory Impairment: Retrieval deficit;Decreased short term memory Decreased Short Term Memory: Verbal basic (4/4 immediate recall; 2/4 delayed; 4/4 with cues) Awareness: Impaired Awareness Impairment: Intellectual impairment Safety/Judgment: Impaired       Comprehension  Auditory Comprehension Overall Auditory Comprehension: Appears within functional limits for tasks assessed    Expression Expression Primary Mode of Expression: Verbal Verbal Expression Overall Verbal Expression: Appears within functional limits for tasks assessed   Oral / Motor  Oral Motor/Sensory Function Overall Oral Motor/Sensory Function: Moderate impairment Facial ROM: Reduced left;Suspected CN VII (facial) dysfunction Facial Symmetry: Abnormal symmetry left;Suspected CN VII (facial) dysfunction Facial Strength: Reduced left;Suspected CN VII (facial) dysfunction Lingual ROM: Suspected CN XII (hypoglossal) dysfunction Lingual Symmetry: Abnormal symmetry left;Suspected CN XII (hypoglossal) dysfunction Lingual Strength: Reduced;Suspected CN XII (hypoglossal) dysfunction Motor Speech Overall Motor Speech: Impaired Respiration: Within functional limits Phonation: Low vocal intensity Articulation: Impaired Level of Impairment: Phrase Intelligibility: Intelligibility reduced Phrase: 75-100% accurate   GO                    Osie Bond., M.A. [place] Acute Rehabilitation Services Pager [phone] Office [phone]  11/16/2019, 10:04 AM

## 2019-11-16 NOTE — Progress Notes (Signed)
  Speech Language Pathology Treatment: Dysphagia  Patient Details Name: Charles Sherman MRN: [id number] DOB: [birth year] Today's Date: 11/16/2019 Time: 0901-0910 SLP Time Calculation (min) (ACUTE ONLY): 9 min  Assessment / Plan / Recommendation Clinical Impression  Pt is drowsier this morning and feeling queasy (had an episode of emesis this morning per RN), asking for only coffee off his breakfast tray. He has no overt s/s of aspiration, but there is significant amounts of anterior spillage from his L side while drinking from the cup. SLP provided straw, which eliminated further loss and was more effective than cueing for lip seal while he is sleepy. RN says he has been taking pills well. Recommend continuing current diet with assistance during meals.    HPI HPI: Pt is a 74 y.o. 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 PMH also includes: OSA, MI, HTN, COPD, CHF      SLP Plan  Continue with current plan of care       Recommendations  Diet recommendations: Dysphagia 2 (fine chop);Thin liquid Liquids provided via: Straw Medication Administration: Whole meds with puree Supervision: Staff to assist with self feeding Compensations: Slow rate;Small sips/bites;Lingual sweep for clearance of pocketing Postural Changes and/or Swallow Maneuvers: Seated upright 90 degrees                Oral Care Recommendations: Oral care BID Follow up Recommendations: Inpatient Rehab SLP Visit Diagnosis: Dysphagia, oral phase (R13.11) Plan: Continue with current plan of care       GO                Charles Sherman., M.A. [facility] Pager [phone] Office [phone]  11/16/2019, 9:57 AM

## 2019-11-16 NOTE — Progress Notes (Signed)
Dr. Cheral Marker updated about patient's blood pressure above 180. This RN is hesitant to give PRN metoprolol given patient's heart rate is in the 40s. MD agreed. New orders received for oral amlodipine with the first dose to be given now. Will continue to monitor.

## 2019-11-16 NOTE — Evaluation (Signed)
Occupational Therapy Evaluation Patient Details Name: Charles Sherman MRN: [id number] DOB: 07-01-1945 Today's Date: 11/16/2019    History of Present Illness 74 year old gentleman with known right medial sphenoid wing meningioma scheduled for routine outpatient follow-up after an MRI scan on the 15th.  However patient presented to the outside hospital emergency room with left-sided weakness last night work-up is revealed multiple acute infarcts right MCA distribution. PMH includes CHF, COPD, HTN, MI, OSA.   Clinical Impression   This 74 yo male admitted with above presents to acute OT with PLOF not truly known (hard to understand pt at times, not sure all answers are correct, and no family present). Pt currently has decreased sitting/standing balance, decreased AROM of left side and increase tone, decreased vision on left--all affecting pt's safety and independence with basic ADLs with pt requiring setup/S-total A at present for basic ADLs. He will continue to benefit from acute OT with follow up OT on CIR.     Follow Up Recommendations  CIR;Supervision/Assistance - 24 hour    Equipment Recommendations  Other (comment) (TBD next venue)       Precautions / Restrictions Precautions Precautions: Fall Restrictions Weight Bearing Restrictions: No      Mobility Bed Mobility Overal bed mobility: Needs Assistance Bed Mobility: Supine to Sit     Supine to sit: Max assist;+2 for physical assistance;HOB elevated     General bed mobility comments: Pt needs A for both legs and trunk  Transfers Overall transfer level: Needs assistance Equipment used: 2 person hand held assist Transfers: Sit to/from [facility] Sit to Stand: Max assist;+2 physical assistance Stand pivot transfers: Max assist;+2 physical assistance       General transfer comment: pt requires significant assistance and tactile cueing due to left lateral lean. Pt with noted L knee and LE hyperextension  due to increased tone when standing. Pt unable to lift LLE to advance, instead drags LE with PT/OT facilitation of weight shift and pivot    Balance Overall balance assessment: Needs assistance Sitting-balance support: Single extremity supported;Bilateral upper extremity supported;Feet supported Sitting balance-Leahy Scale: Poor Sitting balance - Comments: minA with UE support, maxA without UE support due to left lean Postural control: Left lateral lean;Posterior lean Standing balance support: Bilateral upper extremity supported Standing balance-Leahy Scale: Zero Standing balance comment: maxA x2, left lateral lean                           ADL either performed or assessed with clinical judgement   ADL Overall ADL's : Needs assistance/impaired Eating/Feeding: Total assistance;Sitting   Grooming: Wash/dry hands;Set up;Supervision/safety Grooming Details (indicate cue type and reason): supported sitting Upper Body Bathing: Maximal assistance Upper Body Bathing Details (indicate cue type and reason): supported sitting Lower Body Bathing: Total assistance Lower Body Bathing Details (indicate cue type and reason): Max A +2 sit<>stand Upper Body Dressing : Total assistance Upper Body Dressing Details (indicate cue type and reason): supported sitting Lower Body Dressing: Total assistance Lower Body Dressing Details (indicate cue type and reason): Max A +2 sit<>stand Toilet Transfer: Maximal assistance;+2 for physical assistance;Stand-pivot   Toileting- Clothing Manipulation and Hygiene: Total assistance Toileting - Clothing Manipulation Details (indicate cue type and reason): Max A +2 sit<>stand             Vision   Vision Assessment?: Vision impaired- to be further tested in functional context Additional Comments: Has trouble finding the flashlight on his left side but not on  right side            Pertinent Vitals/Pain Pain Assessment: No/denies pain     Hand  Dominance Right   Extremity/Trunk Assessment Upper Extremity Assessment Upper Extremity Assessment: LUE deficits/detail LUE Deficits / Details: intermittent GM movement noted, some increased tone noted as well LUE Coordination: decreased gross motor              Cognition Arousal/Alertness: Awake/alert Behavior During Therapy: Flat affect Overall Cognitive Status: Impaired/Different from baseline Area of Impairment: Attention;Memory;Following commands;Safety/judgement;Awareness;Problem solving                   Current Attention Level: Sustained Memory: Decreased recall of precautions;Decreased short-term memory Following Commands: Follows one step commands with increased time Safety/Judgement: Decreased awareness of safety;Decreased awareness of deficits Awareness: Intellectual Problem Solving: Slow processing;Decreased initiation;Difficulty sequencing;Requires verbal cues;Requires tactile cues                Home Living Family/patient expects to be discharged to:: Inpatient rehab Living Arrangements: Spouse/significant other Available Help at Discharge: Family;Available 24 hours/day Type of Home: House Home Access: Stairs to enter [employer] of Steps: 2 Entrance Stairs-Rails: None Home Layout: One level;Laundry or work area in [place] Shower/Tub: Teacher, early years/pre: [place]: Environmental consultant - 2 wheels;Cane - single point          Prior Functioning/Environment Level of Independence: Independent        Comments: pt reports he enjoys going fishing        OT Problem List: Decreased strength;Decreased range of motion;Impaired balance (sitting and/or standing);Impaired vision/perception;Decreased coordination;Decreased cognition;Decreased safety awareness;Decreased knowledge of use of DME or AE;Impaired UE functional use;Impaired tone      OT Treatment/Interventions: Self-care/ADL  training;Neuromuscular education;Therapeutic activities;DME and/or AE instruction;Patient/family education;Balance training;Visual/perceptual remediation/compensation    OT Goals(Current goals can be found in the care plan section) Acute Rehab OT Goals Patient Stated Goal: to be able to move better OT Goal Formulation: With patient Time For Goal Achievement: 11/30/19 Potential to Achieve Goals: Good  OT Frequency: Min 2X/week           Co-evaluation PT/OT/SLP Co-Evaluation/Treatment: Yes Reason for Co-Treatment: Complexity of the patient's impairments (multi-system involvement);Necessary to address cognition/behavior during functional activity;For patient/therapist safety;To address functional/ADL transfers PT goals addressed during session: Mobility/safety with mobility;Balance;Strengthening/ROM OT goals addressed during session: Strengthening/ROM;ADL's and self-care      AM-PAC OT "6 Clicks" Daily Activity     Outcome Measure Help from another person eating meals?: Total Help from another person taking care of personal grooming?: A Lot Help from another person toileting, which includes using toliet, bedpan, or urinal?: Total Help from another person bathing (including washing, rinsing, drying)?: A Lot Help from another person to put on and taking off regular upper body clothing?: Total Help from another person to put on and taking off regular lower body clothing?: Total 6 Click Score: 8   End of Session Equipment Utilized During Treatment: Gait belt Nurse Communication: Mobility status;Need for lift equipment  Activity Tolerance: Patient tolerated treatment well Patient left: in chair;with call bell/phone within reach;with chair alarm set                   Time: 3557-3220 OT Time Calculation (min): 33 min Charges:  OT General Charges $OT Visit: 1 Visit OT Evaluation $OT Eval Moderate Complexity: [place], OTR/L Acute [facility]  [phone] Office  [phone]     [name] 11/16/2019, 10:08 PM

## 2019-11-16 NOTE — Progress Notes (Signed)
OT Cancellation Note  Patient Details Name: Charles Sherman MRN: [id number] DOB: 09-11-1945   Cancelled Treatment:    Reason Eval/Treat Not Completed: Other (comment)Chat text from PT that pt is going down for a CT at 11:00 to see if increased in swelling and pt with emesis this morning as well. Will check back on patient at a later time for evaluation.  [name], OTR/L Acute Rehab Services Pager [phone] Office [phone]     [name] 11/16/2019, 10:52 AM

## 2019-11-17 LAB — CBC
HCT: 46.8 % (ref 39.0–52.0)
Hemoglobin: 15.7 g/dL (ref 13.0–17.0)
MCH: 31.6 pg (ref 26.0–34.0)
MCHC: 33.5 g/dL (ref 30.0–36.0)
MCV: 94.2 fL (ref 80.0–100.0)
Platelets: 182 10*3/uL (ref 150–400)
RBC: 4.97 MIL/uL (ref 4.22–5.81)
RDW: 14.4 % (ref 11.5–15.5)
WBC: 13.4 10*3/uL — ABNORMAL HIGH (ref 4.0–10.5)
nRBC: 0 % (ref 0.0–0.2)

## 2019-11-17 LAB — GLUCOSE, CAPILLARY
Glucose-Capillary: 107 mg/dL — ABNORMAL HIGH (ref 70–99)
Glucose-Capillary: 134 mg/dL — ABNORMAL HIGH (ref 70–99)

## 2019-11-17 LAB — BASIC METABOLIC PANEL
Anion gap: 10 (ref 5–15)
BUN: 25 mg/dL — ABNORMAL HIGH (ref 8–23)
CO2: 26 mmol/L (ref 22–32)
Calcium: 9.5 mg/dL (ref 8.9–10.3)
Chloride: 105 mmol/L (ref 98–111)
Creatinine, Ser: 1.25 mg/dL — ABNORMAL HIGH (ref 0.61–1.24)
GFR calc Af Amer: >60 mL/min (ref >60)
GFR calc non Af Amer: 56 mL/min — ABNORMAL LOW (ref >60)
Glucose, Bld: 110 mg/dL — ABNORMAL HIGH (ref 70–99)
Potassium: 4.4 mmol/L (ref 3.5–5.1)
Sodium: 141 mmol/L (ref 135–145)

## 2019-11-17 NOTE — Progress Notes (Signed)
STROKE TEAM PROGRESS NOTE   INTERVAL HISTORY Pt lying in bed, no family at bedside. Pt awake alert, mental status improved from yesterday. Left UE muscle strength also improving. CT repeat no significant change. Discussed with Drs. Nundkumar and Deveshwar, will need to consider right carotid stenting once stable. TTE also concerning for MV mass, will arrange TEE next week.   Vitals:   11/17/19 0500 11/17/19 0619 11/17/19 0700 11/17/19 0800  BP: (!) 168/78 (!) 173/74 (!) 158/66   Pulse: (!) 49  (!) 52   Resp: 20  10   Temp:    97.7 F (36.5 C)  TempSrc:    Oral  SpO2: 98%  99%    CBC:  Recent Labs  Lab 11/15/19 1143 11/17/19 0504  WBC 15.1* 13.4*  HGB 15.9 15.7  HCT 47.6 46.8  MCV 96.6 94.2  PLT 185 573   Basic Metabolic Panel:  Recent Labs  Lab 11/15/19 1143 11/17/19 0504  NA 142 141  K 4.7 4.4  CL 107 105  CO2 24 26  GLUCOSE 121* 110*  BUN 18 25*  CREATININE 1.19 1.25*  CALCIUM 9.9 9.5   Lipid Panel:  Recent Labs  Lab 11/15/19 1143  CHOL 189  TRIG 54  HDL 64  CHOLHDL 3.0  VLDL 11  LDLCALC 114*   HgbA1c:  Recent Labs  Lab 11/15/19 1143  HGBA1C 5.4   Urine Drug Screen: No results for input(s): LABOPIA, COCAINSCRNUR, LABBENZ, AMPHETMU, THCU, LABBARB in the last 168 hours.  Alcohol Level No results for input(s): ETH in the last 168 hours.  IMAGING past 24 hours CT HEAD WO CONTRAST  Result Date: 11/16/2019 CLINICAL DATA:  Stroke follow-up EXAM: CT HEAD WITHOUT CONTRAST TECHNIQUE: Contiguous axial images were obtained from the base of the skull through the vertex without intravenous contrast. COMPARISON:  CT head 11/14/2019.  MRI head 11/14/2019 FINDINGS: Brain: Hypodensity in the right frontal parietal cortex compatible with acute infarct as noted on MRI. This extends into the right occipital lobe. No associated hemorrhage. Chronic microvascular ischemic changes in the white matter. 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 This extends  into the prepontine cistern with mild mass-effect on the pons on the right. There is surrounding vasogenic edema in the right temporoparietal lobe. Vascular: Negative for hyperdense vessel Skull: Negative Sinuses/Orbits: Mild mucosal edema paranasal sinuses. Negative orbit. Other: None IMPRESSION: Acute infarct right MCA territory in the right frontal parietal lobe extending into the occipital lobe is unchanged. No acute hemorrhage Large right cavernous sinus meningioma unchanged. There is vasogenic edema in the right temporoparietal lobe. 9 mm midline shift to the left unchanged. Electronically Signed   By: Franchot Gallo M.D.   On: 11/16/2019 11:38   ECHOCARDIOGRAM COMPLETE  Result Date: 11/16/2019    ECHOCARDIOGRAM REPORT   Patient Name:   Charles Sherman [facility] Date of Exam: 11/16/2019 Medical Rec #:  [id number]             Height:       71.0 in Accession #:    [id number]            Weight:       262.4 lb Date of Birth:  1945/08/23              BSA:          2.367 m Patient Age:    74 years              BP:  191/98 mmHg Patient Gender: M                     HR:           52 bpm. Exam Location:  Inpatient Procedure: 2D Echo, Cardiac Doppler and Color Doppler Indications:    CVA  History:        Patient has prior history of Echocardiogram examinations, most                 recent 03/28/2019. CHF, Previous Myocardial Infarction, COPD; Risk                 Factors:Current Smoker and Hypertension.  Sonographer:    Dustin Flock Referring Phys: [id number] [place]  1. There is a small calcified mass attached to the anterior MV leaflet. It is moves with the leaflet. It measures 0.4 cm x 0.7 cm. It is best seen in the PLAX view. 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 There is no significant regurgitation or destruction of the valve to suggest endocarditis. 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 Would recommend a TEE for better characterization in the  setting of recent stroke. The mitral valve is degenerative. Trivial mitral valve regurgitation. No evidence of mitral stenosis.  2. Left ventricular ejection fraction, by estimation, is 60 to 65%. The left ventricle has normal function. The left ventricle has no regional wall motion abnormalities. There is mild concentric left ventricular hypertrophy. Left ventricular diastolic parameters are consistent with Grade I diastolic dysfunction (impaired relaxation).  3. Right ventricular systolic function is normal. The right ventricular size is normal. Tricuspid regurgitation signal is inadequate for assessing PA pressure.  4. Left atrial size was mildly dilated.  5. The aortic valve is tricuspid. There is mild calcification of the aortic valve. There is mild thickening of the aortic valve. Aortic valve regurgitation is not visualized. Mild aortic valve sclerosis is present, with no evidence of aortic valve stenosis.  6. The inferior vena cava is normal in size with greater than 50% respiratory variability, suggesting right atrial pressure of 3 mmHg. Conclusion(s)/Recommendation(s): Findings concerning for MV mass, would recommend Transesophageal Echocardiogram for clarification. FINDINGS  Left Ventricle: Left ventricular ejection fraction, by estimation, is 60 to 65%. The left ventricle has normal function. The left ventricle has no regional wall motion abnormalities. The left ventricular internal cavity size was normal in size. There is  mild concentric left ventricular hypertrophy. Left ventricular diastolic parameters are consistent with Grade I diastolic dysfunction (impaired relaxation). Normal left ventricular filling pressure. Right Ventricle: The right ventricular size is normal. No increase in right ventricular wall thickness. Right ventricular systolic function is normal. Tricuspid regurgitation signal is inadequate for assessing PA pressure. Left Atrium: Left atrial size was mildly dilated. Right Atrium:  Right atrial size was normal in size. Pericardium: Trivial pericardial effusion is present. Mitral Valve: There is a small calcified mass attached to the anterior MV leaflet. It is moves with the leaflet. It measures 0.4 cm x 0.7 cm. It is best seen in the PLAX view. 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 There is no significant regurgitation or destruction of the valve to suggest endocarditis. 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 Would recommend a TEE for better characterization in the setting of recent stroke. The mitral valve is degenerative in appearance. There is mild calcification of the anterior and posterior mitral valve leaflet(s). Trivial mitral valve regurgitation. No  evidence of mitral valve stenosis. Tricuspid Valve: The tricuspid valve is grossly normal. Tricuspid valve regurgitation is trivial. No evidence of tricuspid stenosis. Aortic Valve: The aortic valve is tricuspid. There is mild calcification of the aortic valve. There is mild thickening of the aortic valve. Aortic valve regurgitation is not visualized. Mild aortic valve sclerosis is present, with no evidence of aortic valve stenosis. Pulmonic Valve: The pulmonic valve was grossly normal. Pulmonic valve regurgitation is not visualized. No evidence of pulmonic stenosis. Aorta: The aortic root is normal in size and structure. Venous: The inferior vena cava is normal in size with greater than 50% respiratory variability, suggesting right atrial pressure of 3 mmHg. IAS/Shunts: The atrial septum is grossly normal.  LEFT VENTRICLE PLAX 2D LVIDd:         4.50 cm  Diastology LVIDs:         3.10 cm  LV e' medial:    5.87 cm/s LV PW:         1.20 cm  LV E/e' medial:  10.9 LV IVS:        1.20 cm  LV e' lateral:   7.72 cm/s LVOT diam:     2.50 cm  LV E/e' lateral: 8.3 LV SV:         135 LV SV Index:   57 LVOT Area:     4.91 cm  RIGHT VENTRICLE RV Basal diam:  3.40 cm RV S prime:     18.00 cm/s TAPSE  (M-mode): 3.8 cm LEFT ATRIUM           Index       RIGHT ATRIUM           Index LA diam:      4.90 cm 2.07 cm/m  RA Area:     21.30 cm LA Vol (A2C): 61.8 ml 26.11 ml/m RA Volume:   61.30 ml  25.90 ml/m LA Vol (A4C): 96.9 ml 40.95 ml/m  AORTIC VALVE LVOT Vmax:   110.00 cm/s LVOT Vmean:  72.600 cm/s LVOT VTI:    0.275 m  AORTA Ao Root diam: 3.40 cm MITRAL VALVE MV Area (PHT): 3.72 cm    SHUNTS MV Decel Time: 204 msec    Systemic VTI:  0.28 m MV E velocity: 63.80 cm/s  Systemic Diam: 2.50 cm MV A velocity: 55.30 cm/s MV E/A ratio:  1.15 Eleonore Chiquito MD Electronically signed by Eleonore Chiquito MD Signature Date/Time: 11/16/2019/11:52:00 AM    Final     PHYSICAL EXAM  Temp:  [97.7 F (36.5 C)-98.4 F (36.9 C)] 97.7 F (36.5 C) (09/25 0800) Pulse Rate:  [44-70] 52 (09/25 0700) Resp:  [10-22] 10 (09/25 0700) BP: (129-191)/(61-98) 158/66 (09/25 0700) SpO2:  [92 %-100 %] 99 % (09/25 0700)  General - Well nourished, well developed, not in acute distress  Ophthalmologic - fundi not visualized due to noncooperation.  Cardiovascular - Regular rhythm and rate.  Neuro - awake alert, eyes open, follow up simple commands. Orientated to age, time and place. Paucity of speech, no aphasia, able to name and repeat simple sentences. Right gaze preference, able to cross midline, left gaze incomplete. Eyes conjugate, but limited upper and lower gaze. 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 Left able to count fingers, intact pupillary reflex. Left facial droop. Tongue midline. RUE 5/5, RLE 3+/5. However, LUE 2/5 proximal, 2+ bicep, 0/5 finger movement with increased muscle tone. LLE 3-/5 proximal and 3/5 distal DF. Sensation symmetrical per pt,  right FTN intact. Gait not tested.   ASSESSMENT/PLAN Charles Sherman is a 74 y.o. male with history of COPD, CHF, HTN, MI, OSA and known meningioma (from 05/2019 with R CRAO, followed by NS w/ scheduled appt for 9/23) presenting  to [facility] 9/22 with L facial droop, L sided weakness and dysarthria.   Stroke: Scattered R MCA and ACA infarcts in setting of right ICA bulb and siphon stenosis and right MCA narrowing due to large R meningioma. Can not rule out embolic source.  MRI  [facility]) 9/22 R MCA and ACA scattered infarcts. Elevation of R MCA d/t mass effect. R middle cranial fossa meningioma involving R cavernous sinus and sella turcica unchanged w/ 70mm L midline shift.  Vasogenic edema on the right brain.  CTA head large R cavernous sinus meningioma extending into prepontine cistern on the R, mass enters the sella. Extensive atherosclerotic calcification B cavernous ICA and supraclinoid ICA. Distal B VA mild stenoses. Right middle cerebral artery is up lifted by tumor.  CT - 11/16/19 -  Acute infarct right MCA territory in the right frontal parietal lobe extending into the occipital lobe is unchanged. 9 mm midline shift to the left unchanged.  Carotid Doppler nondiagnostic of right ICA due to extensive plaque  CTA neck Bulky eccentric plaque at the right bifurcation/proximal right ICA with associated stenosis of up to 65-70%  2D Echo - EF 60 - 65%. Findings concerning for MV mass - TEE recommended  for clarification. Will arrange for Monday.   LDL 114  HgbA1c 5.4  VTE prophylaxis - lovenox   aspirin 81 mg daily prior to admission, now on aspirin 325 mg daily and plavix DAPT for 3 months and then plavix alone.   Therapy recommendations:  pending   Disposition:  pending   Meningioma, large  Large R cavernous area extending into prepontine cistern and sella w/ upward displacement of R MCA  Previously not a surgical candidate given R CRAO at time of dx  Currently not a surgical candidate given acute stroke  NS following for possible future removal  Vasogenic edema on Decadron IV  Carotid stenosis Hx of R CRAO  05/2019 right eye vision loss  02/2019 CUS Bilateral carotid atherosclerosis.  No hemodynamically significant ICA stenosis.   This admission CUS nondiagnostic of right ICA due to extensive plaque  CTA neck Bulky eccentric plaque at the right bifurcation/proximal right ICA with associated stenosis of up to 65-70%  May consider right CEA in the near future. I have discussed with Dr. Estanislado Pandy and he will see pt   Vasogenic edema  CT and MRI showed right brain vasogenic edema due to tumor  On decadron IV  Put on CBG and SSI  Vomiting 9/23   CT - 11/16/19 - Large right cavernous sinus meningioma unchanged. There is vasogenic edema in the right temporoparietal lobe. 9 mm midline shift to the left unchanged.  Hypertension  Home meds:  HCTZ 25 tid, imdur 120, amlodipine 10, lasix 80 bid, metoprolol 25 bid  On amlodipine 10, metoprolol 25 bid, lasix 40 bid and imdur 120 daily  On hydralazine PRN  BP stable  BP goal < 180/105 - gradually decrease BP to the long term goal . Long-term BP goal 130-150 given ICA siphon and bulb stenosis  Hyperlipidemia  Home meds:  lipitor 40, resumed in hospital  LDL 114, goal < 70  Increase lipitor to 80  Continue statin at discharge  Tobacco abuse  Current smoker  Smoking  cessation counseling provided  Pt is willing to quit     Other Stroke Risk Factors  Advanced age  Obesity, recommend weight loss, diet and exercise as appropriate   Coronary artery disease, hx MI w/ PCI x 3  Obstructive sleep apnea, on CPAP at home  Chronic diastolic Congestive heart failure  Other Active Problems  Leukocytosis WBC 8.3->15.1->13.4  COPD  Bradycardia (40's) (on Lopressor 25 mg Bid home meds)   AKI - creatinine - 1.25 - encourage po intake - on IVF  Hospital day # 3  This patient is critically ill due to large brain tumor, large right MCA infarct, stenosis of ICA bulb and siphon, cerebral edema and at significant risk of neurological worsening, death form brain herniation, recurrent stroke, seizure, hemorrhagic  conversion. This patient's care requires constant monitoring of vital signs, hemodynamics, respiratory and cardiac monitoring, review of multiple databases, neurological assessment, discussion with family, other specialists and medical decision making of high complexity. I spent 35 minutes of neurocritical care time in the care of this patient. I have discussed with Dr. Kathyrn Sheriff and Dr. Estanislado Pandy.  Rosalin Hawking, MD PhD Stroke Neurology 11/17/2019 10:20 AM    To contact Stroke Continuity provider, please refer to [email or web address] After hours, contact General Neurology

## 2019-11-17 NOTE — Progress Notes (Signed)
Subjective: NAEs o/n  Objective: Vital signs in last 24 hours: Temp:  [97.7 F (36.5 C)-98.4 F (36.9 C)] 97.7 F (36.5 C) (09/25 0800) Pulse Rate:  [44-70] 48 (09/25 1000) Resp:  [5-22] 7 (09/25 1000) BP: (129-173)/(61-85) 158/74 (09/25 1000) SpO2:  [92 %-100 %] 100 % (09/25 1000)  Intake/Output from previous day: 09/24 0701 - 09/25 0700 In: 937.2 [I.V.:937.2] Out: 1175 [Urine:1175] Intake/Output this shift: Total I/O In: 248.5 [I.V.:248.5] Out: -   Left facial droop Left arm flaccid Dysarthric, but oriented  Lab Results: Recent Labs    11/15/19 1143 11/17/19 0504  WBC 15.1* 13.4*  HGB 15.9 15.7  HCT 47.6 46.8  PLT 185 182   BMET Recent Labs    11/15/19 1143 11/17/19 0504  NA 142 141  K 4.7 4.4  CL 107 105  CO2 24 26  GLUCOSE 121* 110*  BUN 18 25*  CREATININE 1.19 1.25*  CALCIUM 9.9 9.5    Studies/Results: CT HEAD WO CONTRAST  Result Date: 11/16/2019 CLINICAL DATA:  Stroke follow-up EXAM: CT HEAD WITHOUT CONTRAST TECHNIQUE: Contiguous axial images were obtained from the base of the skull through the vertex without intravenous contrast. COMPARISON:  CT head 11/14/2019.  MRI head 11/14/2019 FINDINGS: Brain: Hypodensity in the right frontal parietal cortex compatible with acute infarct as noted on MRI. This extends into the right occipital lobe. No associated hemorrhage. Chronic microvascular ischemic changes in the white matter. 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 This extends into the prepontine cistern with mild mass-effect on the pons on the right. There is surrounding vasogenic edema in the right temporoparietal lobe. Vascular: Negative for hyperdense vessel Skull: Negative Sinuses/Orbits: Mild mucosal edema paranasal sinuses. Negative orbit. Other: None IMPRESSION: Acute infarct right MCA territory in the right frontal parietal lobe extending into the occipital lobe is unchanged. No acute hemorrhage Large right cavernous sinus  meningioma unchanged. There is vasogenic edema in the right temporoparietal lobe. 9 mm midline shift to the left unchanged. Electronically Signed   By: Franchot Gallo M.D.   On: 11/16/2019 11:38   CT ANGIO NECK W OR WO CONTRAST  Result Date: 11/16/2019 CLINICAL DATA:  Follow-up examination for acute stroke. EXAM: CT ANGIOGRAPHY NECK TECHNIQUE: Multidetector CT imaging of the neck was performed using the standard protocol during bolus administration of intravenous contrast. Multiplanar CT image reconstructions and MIPs were obtained to evaluate the vascular anatomy. Carotid stenosis measurements (when applicable) are obtained utilizing NASCET criteria, using the distal internal carotid diameter as the denominator. CONTRAST:  123mL OMNIPAQUE IOHEXOL 350 MG/ML SOLN COMPARISON:  Prior CTA from 11/14/2019. FINDINGS: Aortic arch: Examination technically limited by positioning. Visualized aortic arch normal in caliber with normal branch pattern. Mild-to-moderate atheromatous change about the arch and origin of the great vessels. No hemodynamically significant stenosis. Right carotid system: Right common carotid artery patent from its origin to the bifurcation without stenosis. Bulky eccentric plaque at the right bifurcation/proximal right ICA with associated stenosis of up to 65-70% by NASCET criteria. Right ICA tortuous but otherwise patent to the skull base without stenosis, dissection or occlusion. Left carotid system: Left CCA patent from its origin to the bifurcation without stenosis. Scattered calcified plaque about the left bifurcation/proximal left ICA with no more than mild 10-20% stenosis by NASCET criteria. Left ICA tortuous but otherwise widely patent to the skull base without stenosis, dissection or occlusion. Vertebral arteries: Both vertebral arteries arise from the subclavian arteries. No proximal subclavian artery stenosis. Mild scattered plaque about the origins and  proximal vertebral arteries without  hemodynamically significant stenosis. Vertebral arteries patent within the neck without significant stenosis, dissection, or occlusion. Skeleton: No visible acute osseous abnormality, although evaluation limited by positioning. No discrete or worrisome osseous lesions. Mild multilevel cervical spondylosis, most pronounced at C5-6. Other neck: No other acute soft tissue abnormality within the neck. No visible mass or adenopathy. Upper chest: Scattered atelectatic changes noted within the visualized lungs. A degree of pulmonary interstitial congestion may be present as well. No other definite acute abnormality. IMPRESSION: 1. Bulky eccentric plaque at the right bifurcation/proximal right ICA with associated stenosis of up to 65-70% by NASCET criteria. 2. Mild atheromatous change about the left carotid bifurcation/proximal left ICA with no more than mild 10-20% stenosis by NASCET criteria. 3. Wide patency of both vertebral arteries within the neck. 4. Diffuse tortuosity of the major arterial vasculature of the neck, suggesting chronic underlying hypertension. Electronically Signed   By: Jeannine Boga M.D.   On: 11/16/2019 02:05   ECHOCARDIOGRAM COMPLETE  Result Date: 11/16/2019    ECHOCARDIOGRAM REPORT   Patient Name:   [facility] Date of Exam: 11/16/2019 Medical Rec #:  [id number]             Height:       71.0 in Accession #:    [id number]            Weight:       262.4 lb Date of Birth:  May 18, 1945              BSA:          2.367 m Patient Age:    74 years              BP:           191/98 mmHg Patient Gender: M                     HR:           52 bpm. Exam Location:  Inpatient Procedure: 2D Echo, Cardiac Doppler and Color Doppler Indications:    CVA  History:        Patient has prior history of Echocardiogram examinations, most                 recent 03/28/2019. CHF, Previous Myocardial Infarction, COPD; Risk                 Factors:Current Smoker and Hypertension.  Sonographer:    Dustin Flock Referring Phys: [id number] Lindsay  1. There is a small calcified mass attached to the anterior MV leaflet. It is moves with the leaflet. It measures 0.4 cm x 0.7 cm. It is best seen in the PLAX view. 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 There is no significant regurgitation or destruction of the valve to suggest endocarditis. 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 Would recommend a TEE for better characterization in the setting of recent stroke. The mitral valve is degenerative. Trivial mitral valve regurgitation. No evidence of mitral stenosis.  2. Left ventricular ejection fraction, by estimation, is 60 to 65%. The left ventricle has normal function. The left ventricle has no regional wall motion abnormalities. There is mild concentric left ventricular hypertrophy. Left ventricular diastolic parameters are consistent with Grade I diastolic dysfunction (impaired relaxation).  3. Right ventricular systolic function is normal. The right ventricular size is normal. Tricuspid regurgitation signal is inadequate for assessing PA pressure.  4. Left atrial size was mildly dilated.  5. The aortic valve is tricuspid. There is mild calcification of the aortic valve. There is mild thickening of the aortic valve. Aortic valve regurgitation is not visualized. Mild aortic valve sclerosis is present, with no evidence of aortic valve stenosis.  6. The inferior vena cava is normal in size with greater than 50% respiratory variability, suggesting right atrial pressure of 3 mmHg. Conclusion(s)/Recommendation(s): Findings concerning for MV mass, would recommend Transesophageal Echocardiogram for clarification. FINDINGS  Left Ventricle: Left ventricular ejection fraction, by estimation, is 60 to 65%. The left ventricle has normal function. The left ventricle has no regional wall motion abnormalities. The left ventricular internal cavity size was normal in size.  There is  mild concentric left ventricular hypertrophy. Left ventricular diastolic parameters are consistent with Grade I diastolic dysfunction (impaired relaxation). Normal left ventricular filling pressure. Right Ventricle: The right ventricular size is normal. No increase in right ventricular wall thickness. Right ventricular systolic function is normal. Tricuspid regurgitation signal is inadequate for assessing PA pressure. Left Atrium: Left atrial size was mildly dilated. Right Atrium: Right atrial size was normal in size. Pericardium: Trivial pericardial effusion is present. Mitral Valve: There is a small calcified mass attached to the anterior MV leaflet. It is moves with the leaflet. It measures 0.4 cm x 0.7 cm. It is best seen in the PLAX view. 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 There is no significant regurgitation or destruction of the valve to suggest endocarditis. 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 Would recommend a TEE for better characterization in the setting of recent stroke. The mitral valve is degenerative in appearance. There is mild calcification of the anterior and posterior mitral valve leaflet(s). Trivial mitral valve regurgitation. No evidence of mitral valve stenosis. Tricuspid Valve: The tricuspid valve is grossly normal. Tricuspid valve regurgitation is trivial. No evidence of tricuspid stenosis. Aortic Valve: The aortic valve is tricuspid. There is mild calcification of the aortic valve. There is mild thickening of the aortic valve. Aortic valve regurgitation is not visualized. Mild aortic valve sclerosis is present, with no evidence of aortic valve stenosis. Pulmonic Valve: The pulmonic valve was grossly normal. Pulmonic valve regurgitation is not visualized. No evidence of pulmonic stenosis. Aorta: The aortic root is normal in size and structure. Venous: The inferior vena cava is normal in size with greater than 50% respiratory  variability, suggesting right atrial pressure of 3 mmHg. IAS/Shunts: The atrial septum is grossly normal.  LEFT VENTRICLE PLAX 2D LVIDd:         4.50 cm  Diastology LVIDs:         3.10 cm  LV e' medial:    5.87 cm/s LV PW:         1.20 cm  LV E/e' medial:  10.9 LV IVS:        1.20 cm  LV e' lateral:   7.72 cm/s LVOT diam:     2.50 cm  LV E/e' lateral: 8.3 LV SV:         135 LV SV Index:   57 LVOT Area:     4.91 cm  RIGHT VENTRICLE RV Basal diam:  3.40 cm RV S prime:     18.00 cm/s TAPSE (M-mode): 3.8 cm LEFT ATRIUM           Index       RIGHT ATRIUM           Index LA diam:  4.90 cm 2.07 cm/m  RA Area:     21.30 cm LA Vol (A2C): 61.8 ml 26.11 ml/m RA Volume:   61.30 ml  25.90 ml/m LA Vol (A4C): 96.9 ml 40.95 ml/m  AORTIC VALVE LVOT Vmax:   110.00 cm/s LVOT Vmean:  72.600 cm/s LVOT VTI:    0.275 m  AORTA Ao Root diam: 3.40 cm MITRAL VALVE MV Area (PHT): 3.72 cm    SHUNTS MV Decel Time: 204 msec    Systemic VTI:  0.28 m MV E velocity: 63.80 cm/s  Systemic Diam: 2.50 cm MV A velocity: 55.30 cm/s MV E/A ratio:  1.15 Eleonore Chiquito MD Electronically signed by Eleonore Chiquito MD Signature Date/Time: 11/16/2019/11:52:00 AM    Final    VAS US CAROTID  Result Date: 11/15/2019 Carotid Arterial Duplex Study Indications:  TIA. Risk Factors: Hypertension, hyperlipidemia. Performing Technologist: Griffin Basil RCT RDMS  Examination Guidelines: A complete evaluation includes B-mode imaging, spectral Doppler, color Doppler, and power Doppler as needed of all accessible portions of each vessel. Bilateral testing is considered an integral part of a complete examination. Limited examinations for reoccurring indications may be performed as noted.  Right Carotid Findings: +----------+--------+--------+--------+------------------+---------------------+           PSV cm/sEDV cm/sStenosisPlaque DescriptionComments              +----------+--------+--------+--------+------------------+---------------------+ CCA Prox   73      14                                                      +----------+--------+--------+--------+------------------+---------------------+ CCA Distal53      8                                 intimal thickening    +----------+--------+--------+--------+------------------+---------------------+ ICA Prox                                            limited evaluation                                                        due to extensive                                                          calcified plaque      +----------+--------+--------+--------+------------------+---------------------+ ECA       105     15                                                      +----------+--------+--------+--------+------------------+---------------------+ +----------+--------+-------+--------+-------------------+           PSV cm/sEDV cmsDescribeArm Pressure (mmHG) +----------+--------+-------+--------+-------------------+ [id number]                                        +----------+--------+-------+--------+-------------------+ +---------+--------+--+--------+--+---------+  VertebralPSV cm/s60EDV cm/s16Antegrade +---------+--------+--+--------+--+---------+  Left Carotid Findings: +----------+--------+--------+--------+------------------+------------------+           PSV cm/sEDV cm/sStenosisPlaque DescriptionComments           +----------+--------+--------+--------+------------------+------------------+ CCA Prox  78      10                                                   +----------+--------+--------+--------+------------------+------------------+ CCA Distal56      13                                intimal thickening +----------+--------+--------+--------+------------------+------------------+ ICA Prox  92      24      1-39%                     intimal thickening  +----------+--------+--------+--------+------------------+------------------+ ICA Distal109     17                                                   +----------+--------+--------+--------+------------------+------------------+ ECA       100     6                                                    +----------+--------+--------+--------+------------------+------------------+ +----------+--------+--------+--------+-------------------+           PSV cm/sEDV cm/sDescribeArm Pressure (mmHG) +----------+--------+--------+--------+-------------------+ [id number]     2                                   +----------+--------+--------+--------+-------------------+ +---------+--------+--+--------+--+---------+ VertebralPSV cm/s43EDV cm/s12Antegrade +---------+--------+--+--------+--+---------+   Summary: Right         Non Diagnostic Right Ica evaluation due to extensive calcified Carotid:      plaque. Left Carotid: Velocities in the left ICA are consistent with a 1-39% stenosis. Vertebrals: Bilateral vertebral arteries demonstrate antegrade flow. *See table(s) above for measurements and observations.  Electronically signed by Servando Snare MD on 11/15/2019 at 7:10:45 PM.    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

## 2019-11-18 LAB — GLUCOSE, CAPILLARY
Glucose-Capillary: 111 mg/dL — ABNORMAL HIGH (ref 70–99)
Glucose-Capillary: 122 mg/dL — ABNORMAL HIGH (ref 70–99)
Glucose-Capillary: 128 mg/dL — ABNORMAL HIGH (ref 70–99)
Glucose-Capillary: 132 mg/dL — ABNORMAL HIGH (ref 70–99)
Glucose-Capillary: 98 mg/dL (ref 70–99)

## 2019-11-18 LAB — BASIC METABOLIC PANEL
Anion gap: 9 (ref 5–15)
BUN: 32 mg/dL — ABNORMAL HIGH (ref 8–23)
CO2: 25 mmol/L (ref 22–32)
Calcium: 9.3 mg/dL (ref 8.9–10.3)
Chloride: 105 mmol/L (ref 98–111)
Creatinine, Ser: 1.43 mg/dL — ABNORMAL HIGH (ref 0.61–1.24)
GFR calc Af Amer: 55 mL/min — ABNORMAL LOW (ref >60)
GFR calc non Af Amer: 47 mL/min — ABNORMAL LOW (ref >60)
Glucose, Bld: 115 mg/dL — ABNORMAL HIGH (ref 70–99)
Potassium: 4.5 mmol/L (ref 3.5–5.1)
Sodium: 139 mmol/L (ref 135–145)

## 2019-11-18 LAB — CBC
HCT: 45.7 % (ref 39.0–52.0)
Hemoglobin: 15.2 g/dL (ref 13.0–17.0)
MCH: 31.6 pg (ref 26.0–34.0)
MCHC: 33.3 g/dL (ref 30.0–36.0)
MCV: 95 fL (ref 80.0–100.0)
Platelets: 175 10*3/uL (ref 150–400)
RBC: 4.81 MIL/uL (ref 4.22–5.81)
RDW: 14.3 % (ref 11.5–15.5)
WBC: 11.8 10*3/uL — ABNORMAL HIGH (ref 4.0–10.5)
nRBC: 0 % (ref 0.0–0.2)

## 2019-11-18 NOTE — Progress Notes (Signed)
Subjective: NAEs  Objective: Vital signs in last 24 hours: Temp:  [98.2 F (36.8 C)-98.5 F (36.9 C)] 98.3 F (36.8 C) (09/26 0739) Pulse Rate:  [45-65] 55 (09/26 0739) Resp:  [4-20] 13 (09/26 0739) BP: (99-147)/(59-76) 143/75 (09/26 0739) SpO2:  [92 %-97 %] 96 % (09/26 0739)  Intake/Output from previous day: 09/25 0701 - 09/26 0700 In: 1536.9 [P.O.:240; I.V.:1296.9] Out: 1075 [Urine:1075] Intake/Output this shift: No intake/output data recorded.  Awake, alert.  Left facial droop, dysarthria, LUE flaccid, 2-3/5 strength LLE  Lab Results: Recent Labs    11/17/19 0504 11/18/19 0512  WBC 13.4* 11.8*  HGB 15.7 15.2  HCT 46.8 45.7  PLT 182 175   BMET Recent Labs    11/17/19 0504 11/18/19 0512  NA 141 139  K 4.4 4.5  CL 105 105  CO2 26 25  GLUCOSE 110* 115*  BUN 25* 32*  CREATININE 1.25* 1.43*  CALCIUM 9.5 9.3    Studies/Results: CT HEAD WO CONTRAST  Result Date: 11/16/2019 CLINICAL DATA:  Stroke follow-up EXAM: CT HEAD WITHOUT CONTRAST TECHNIQUE: Contiguous axial images were obtained from the base of the skull through the vertex without intravenous contrast. COMPARISON:  CT head 11/14/2019.  MRI head 11/14/2019 FINDINGS: Brain: Hypodensity in the right frontal parietal cortex compatible with acute infarct as noted on MRI. This extends into the right occipital lobe. No associated hemorrhage. Chronic microvascular ischemic changes in the white matter. 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 This extends into the prepontine cistern with mild mass-effect on the pons on the right. There is surrounding vasogenic edema in the right temporoparietal lobe. Vascular: Negative for hyperdense vessel Skull: Negative Sinuses/Orbits: Mild mucosal edema paranasal sinuses. Negative orbit. Other: None IMPRESSION: Acute infarct right MCA territory in the right frontal parietal lobe extending into the occipital lobe is unchanged. No acute hemorrhage Large right cavernous  sinus meningioma unchanged. There is vasogenic edema in the right temporoparietal lobe. 9 mm midline shift to the left unchanged. Electronically Signed   By: Franchot Gallo M.D.   On: 11/16/2019 11:38   ECHOCARDIOGRAM COMPLETE  Result Date: 11/16/2019    ECHOCARDIOGRAM REPORT   Patient Name:   Charles Sherman [facility] Date of Exam: 11/16/2019 Medical Rec #:  [id number]             Height:       71.0 in Accession #:    [id number]            Weight:       262.4 lb Date of Birth:  15-Apr-1945              BSA:          2.367 m Patient Age:    6 years              BP:           191/98 mmHg Patient Gender: M                     HR:           52 bpm. Exam Location:  Inpatient Procedure: 2D Echo, Cardiac Doppler and Color Doppler Indications:    CVA  History:        Patient has prior history of Echocardiogram examinations, most                 recent 03/28/2019. CHF, Previous Myocardial Infarction, COPD; Risk  Factors:Current Smoker and Hypertension.  Sonographer:    Dustin Flock Referring Phys: [id number] Bernice  1. There is a small calcified mass attached to the anterior MV leaflet. It is moves with the leaflet. It measures 0.4 cm x 0.7 cm. It is best seen in the PLAX view. 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 There is no significant regurgitation or destruction of the valve to suggest endocarditis. 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 Would recommend a TEE for better characterization in the setting of recent stroke. The mitral valve is degenerative. Trivial mitral valve regurgitation. No evidence of mitral stenosis.  2. Left ventricular ejection fraction, by estimation, is 60 to 65%. The left ventricle has normal function. The left ventricle has no regional wall motion abnormalities. There is mild concentric left ventricular hypertrophy. Left ventricular diastolic parameters are consistent with Grade I diastolic dysfunction (impaired  relaxation).  3. Right ventricular systolic function is normal. The right ventricular size is normal. Tricuspid regurgitation signal is inadequate for assessing PA pressure.  4. Left atrial size was mildly dilated.  5. The aortic valve is tricuspid. There is mild calcification of the aortic valve. There is mild thickening of the aortic valve. Aortic valve regurgitation is not visualized. Mild aortic valve sclerosis is present, with no evidence of aortic valve stenosis.  6. The inferior vena cava is normal in size with greater than 50% respiratory variability, suggesting right atrial pressure of 3 mmHg. Conclusion(s)/Recommendation(s): Findings concerning for MV mass, would recommend Transesophageal Echocardiogram for clarification. FINDINGS  Left Ventricle: Left ventricular ejection fraction, by estimation, is 60 to 65%. The left ventricle has normal function. The left ventricle has no regional wall motion abnormalities. The left ventricular internal cavity size was normal in size. There is  mild concentric left ventricular hypertrophy. Left ventricular diastolic parameters are consistent with Grade I diastolic dysfunction (impaired relaxation). Normal left ventricular filling pressure. Right Ventricle: The right ventricular size is normal. No increase in right ventricular wall thickness. Right ventricular systolic function is normal. Tricuspid regurgitation signal is inadequate for assessing PA pressure. Left Atrium: Left atrial size was mildly dilated. Right Atrium: Right atrial size was normal in size. Pericardium: Trivial pericardial effusion is present. Mitral Valve: There is a small calcified mass attached to the anterior MV leaflet. It is moves with the leaflet. It measures 0.4 cm x 0.7 cm. It is best seen in the PLAX view. 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 There is no significant regurgitation or destruction of the valve to suggest endocarditis. This could also  represent a papillary fibrolastoma. Would recommend a TEE for better characterization in the setting of recent stroke. The mitral valve is degenerative in appearance. There is mild calcification of the anterior and posterior mitral valve leaflet(s). Trivial mitral valve regurgitation. No evidence of mitral valve stenosis. Tricuspid Valve: The tricuspid valve is grossly normal. Tricuspid valve regurgitation is trivial. No evidence of tricuspid stenosis. Aortic Valve: The aortic valve is tricuspid. There is mild calcification of the aortic valve. There is mild thickening of the aortic valve. Aortic valve regurgitation is not visualized. Mild aortic valve sclerosis is present, with no evidence of aortic valve stenosis. Pulmonic Valve: The pulmonic valve was grossly normal. Pulmonic valve regurgitation is not visualized. No evidence of pulmonic stenosis. Aorta: The aortic root is normal in size and structure. Venous: The inferior vena cava is normal in size with greater than 50% respiratory variability, suggesting right atrial  pressure of 3 mmHg. IAS/Shunts: The atrial septum is grossly normal.  LEFT VENTRICLE PLAX 2D LVIDd:         4.50 cm  Diastology LVIDs:         3.10 cm  LV e' medial:    5.87 cm/s LV PW:         1.20 cm  LV E/e' medial:  10.9 LV IVS:        1.20 cm  LV e' lateral:   7.72 cm/s LVOT diam:     2.50 cm  LV E/e' lateral: 8.3 LV SV:         135 LV SV Index:   57 LVOT Area:     4.91 cm  RIGHT VENTRICLE RV Basal diam:  3.40 cm RV S prime:     18.00 cm/s TAPSE (M-mode): 3.8 cm LEFT ATRIUM           Index       RIGHT ATRIUM           Index LA diam:      4.90 cm 2.07 cm/m  RA Area:     21.30 cm LA Vol (A2C): 61.8 ml 26.11 ml/m RA Volume:   61.30 ml  25.90 ml/m LA Vol (A4C): 96.9 ml 40.95 ml/m  AORTIC VALVE LVOT Vmax:   110.00 cm/s LVOT Vmean:  72.600 cm/s LVOT VTI:    0.275 m  AORTA Ao Root diam: 3.40 cm MITRAL VALVE MV Area (PHT): 3.72 cm    SHUNTS MV Decel Time: 204 msec    Systemic VTI:  0.28 m MV E  velocity: 63.80 cm/s  Systemic Diam: 2.50 cm MV A velocity: 55.30 cm/s MV E/A ratio:  1.15 Eleonore Chiquito MD Electronically signed by Eleonore Chiquito MD Signature Date/Time: 11/16/2019/11:52:00 AM    Final     Assessment/Plan: 73 yo M with R sphenoid wing meningioma, acute R MCA stroke - patient likely to undergo diagnostic angio tomorrow with Dr. Estanislado Pandy - cont ASA, plavix - lovenox for dvt ppx  Vallarie Mare 11/18/2019, 10:47 AM

## 2019-11-18 NOTE — Progress Notes (Signed)
STROKE TEAM PROGRESS NOTE   INTERVAL HISTORY Patient is sitting up in bed.  His nurse is at the bedside.  He has no complaints.  Neuro exam is unchanged.  Vital signs stable.  Vitals:   11/18/19 0304 11/18/19 0400 11/18/19 0600 11/18/19 0739  BP: 129/75 112/63 131/71 (!) 143/75  Pulse: (!) 50 (!) 47 (!) 52 (!) 55  Resp: 20 10 16 13   Temp: 98.5 F (36.9 C)   98.3 F (36.8 C)  TempSrc: Oral   Oral  SpO2: 94% 96% 92% 96%   CBC:  Recent Labs  Lab 11/17/19 0504 11/18/19 0512  WBC 13.4* 11.8*  HGB 15.7 15.2  HCT 46.8 45.7  MCV 94.2 95.0  PLT 182 102   Basic Metabolic Panel:  Recent Labs  Lab 11/17/19 0504 11/18/19 0512  NA 141 139  K 4.4 4.5  CL 105 105  CO2 26 25  GLUCOSE 110* 115*  BUN 25* 32*  CREATININE 1.25* 1.43*  CALCIUM 9.5 9.3   Lipid Panel:  Recent Labs  Lab 11/15/19 1143  CHOL 189  TRIG 54  HDL 64  CHOLHDL 3.0  VLDL 11  LDLCALC 114*   HgbA1c:  Recent Labs  Lab 11/15/19 1143  HGBA1C 5.4   Urine Drug Screen: No results for input(s): LABOPIA, COCAINSCRNUR, LABBENZ, AMPHETMU, THCU, LABBARB in the last 168 hours.  Alcohol Level No results for input(s): ETH in the last 168 hours.  IMAGING past 24 hours No results found.  PHYSICAL EXAM  Temp:  [98.2 F (36.8 C)-98.5 F (36.9 C)] 98.3 F (36.8 C) (09/26 0739) Pulse Rate:  [45-65] 55 (09/26 0739) Resp:  [4-20] 13 (09/26 0739) BP: (99-158)/(59-76) 143/75 (09/26 0739) SpO2:  [92 %-100 %] 96 % (09/26 0739)  General -obese elderly African-American male not in acute distress  Ophthalmologic - fundi not visualized due to noncooperation.  Cardiovascular - Regular rhythm and rate.  No murmur or gallop  Neuro - awake alert, eyes open, follow up simple commands. Oriented to age, time and place.  No dysarthria no aphasia, able to name and repeat simple sentences. Right gaze preference, able to cross midline, left gaze paresis. Eyes conjugate, but limited upper and lower gaze. 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 Left able to count fingers, intact pupillary reflex. Left facial droop. Tongue midline. RUE 5/5, RLE 3+/5.   LUE 2/5 proximal  0/5 finger movement with increased muscle tone. LLE 3-/5 proximal and 3/5 distal DF. Sensation symmetrical per pt, right FTN intact. Gait not tested.   ASSESSMENT/PLAN Mr. KADEN DAUGHDRILL is a 74 y.o. male with history of COPD, CHF, HTN, MI, OSA and known meningioma (from 05/2019 with R CRAO, followed by NS w/ scheduled appt for 9/23) presenting to [facility] 9/22 with L facial droop, L sided weakness and dysarthria.   Stroke: Scattered R MCA and ACA infarcts in setting of right ICA bulb and siphon stenosis and right MCA narrowing due to large R meningioma. Can not rule out embolic source.  MRI  [facility]) 9/22 R MCA and ACA scattered infarcts. Elevation of R MCA d/t mass effect. R middle cranial fossa meningioma involving R cavernous sinus and sella turcica unchanged w/ 44mm L midline shift.  Vasogenic edema on the right brain.  CTA head large R cavernous sinus meningioma extending into prepontine cistern on the R, mass enters the sella. Extensive atherosclerotic calcification B cavernous ICA and supraclinoid ICA. Distal B VA mild stenoses. Right  middle cerebral artery is up lifted by tumor.  CT - 11/16/19 -  Acute infarct right MCA territory in the right frontal parietal lobe extending into the occipital lobe is unchanged. 9 mm midline shift to the left unchanged.  Carotid Doppler nondiagnostic of right ICA due to extensive plaque  CTA neck Bulky eccentric plaque at the right bifurcation/proximal right ICA with associated stenosis of up to 65-70%  2D Echo - EF 60 - 65%. Findings concerning for MV mass - TEE recommended  for clarification. Will arrange for Monday. (order placed - message sent to cardiology - pt will be NPO Monday)  LDL 114  HgbA1c 5.4  VTE prophylaxis - lovenox   aspirin 81 mg daily prior to  admission, now on aspirin 325 mg daily and plavix DAPT for 3 months and then plavix alone.   Therapy recommendations:  CIR recommended  Disposition:  pending   Meningioma, large  Large R cavernous area extending into prepontine cistern and sella w/ upward displacement of R MCA  Previously not a surgical candidate given R CRAO at time of dx  Currently not a surgical candidate given acute stroke  NS following for possible future removal  Vasogenic edema on Decadron IV  Carotid stenosis Hx of R CRAO  05/2019 right eye vision loss  02/2019 CUS Bilateral carotid atherosclerosis. No hemodynamically significant ICA stenosis.   This admission CUS nondiagnostic of right ICA due to extensive plaque  CTA neck Bulky eccentric plaque at the right bifurcation/proximal right ICA with associated stenosis of up to 65-70%  May consider right CEA in the near future. I have discussed with Dr. Estanislado Pandy and he will see pt   Vasogenic edema  CT and MRI showed right brain vasogenic edema due to tumor  On decadron IV  Put on CBG and SSI  Vomiting 9/23   CT - 11/16/19 - Large right cavernous sinus meningioma unchanged. There is vasogenic edema in the right temporoparietal lobe. 9 mm midline shift to the left unchanged.  Hypertension  Home meds:  HCTZ 25 tid, imdur 120, amlodipine 10, lasix 80 bid, metoprolol 25 bid  On amlodipine 10, metoprolol 25 bid, lasix 40 bid and imdur 120 daily  On hydralazine PRN  BP stable  BP goal < 180/105 - gradually decrease BP to the long term goal . Long-term BP goal 130-150 given ICA siphon and bulb stenosis  Hyperlipidemia  Home meds:  lipitor 40, resumed in hospital  LDL 114, goal < 70  Increase lipitor to 80  Continue statin at discharge  Tobacco abuse  Current smoker  Smoking cessation counseling provided  Pt is willing to quit     Other Stroke Risk Factors  Advanced age  Obesity, recommend weight loss, diet and exercise as  appropriate   Coronary artery disease, hx MI w/ PCI x 3  Obstructive sleep apnea, on CPAP at home  Chronic diastolic Congestive heart failure  Other Active Problems  Leukocytosis WBC 8.3->15.1->13.4->11.8  COPD  Bradycardia (40's) (on Lopressor 25 mg Bid home meds)   AKI - creatinine - 1.25 - encourage po intake - on IVF fluids 50 cc / hr ->1.43 -> will increase IV to 75 cc /hr (pt is on Zaroxolyn and Lasix - CHF hx - may need to decrease dose if creatinine continues to climb)  Hospital day # 4 Plan diagnostic cerebral catheter angiogram to evaluate right carotid plaque to see if he merits angioplasty/stenting. Continue aspirin and Plavix.  Continue Decadron and management of meningioma  as per neurosurgery.  Discussed with Dr. Marcello Moores neurosurgery.  Greater than 50% time during this 35-minute visit was spent on counseling and coordination of care and about his stroke and meningioma and discussion with care team and answering questions Antony Contras, MD   To contact Stroke Continuity provider, please refer to [email or web address] After hours, contact General Neurology

## 2019-11-19 ENCOUNTER — Inpatient Hospital Stay (HOSPITAL_COMMUNITY): Payer: Medicare Other

## 2019-11-19 ENCOUNTER — Encounter (HOSPITAL_COMMUNITY)
Admission: AD | Disposition: A | Discharge status: Rehabilitation Facility | Payer: Self-pay | Source: Other Acute Inpatient Hospital | Attending: Neurological Surgery

## 2019-11-19 ENCOUNTER — Other Ambulatory Visit (HOSPITAL_COMMUNITY): Payer: No Typology Code available for payment source

## 2019-11-19 ENCOUNTER — Encounter (HOSPITAL_COMMUNITY): Payer: Self-pay | Admitting: Neurological Surgery

## 2019-11-19 DIAGNOSIS — I6330 Cerebral infarction due to thrombosis of unspecified cerebral artery: Secondary | ICD-10-CM

## 2019-11-19 HISTORY — PX: IR US GUIDE VASC ACCESS RIGHT: IMG2390

## 2019-11-19 HISTORY — PX: IR ANGIO INTRA EXTRACRAN SEL COM CAROTID INNOMINATE BILAT MOD SED: IMG5360

## 2019-11-19 HISTORY — PX: IR ANGIO VERTEBRAL SEL VERTEBRAL BILAT MOD SED: IMG5369

## 2019-11-19 LAB — GLUCOSE, CAPILLARY
Glucose-Capillary: 109 mg/dL — ABNORMAL HIGH (ref 70–99)
Glucose-Capillary: 126 mg/dL — ABNORMAL HIGH (ref 70–99)

## 2019-11-19 LAB — CBC
HCT: 45.2 % (ref 39.0–52.0)
Hemoglobin: 15 g/dL (ref 13.0–17.0)
MCH: 32.4 pg (ref 26.0–34.0)
MCHC: 33.2 g/dL (ref 30.0–36.0)
MCV: 97.6 fL (ref 80.0–100.0)
Platelets: 169 10*3/uL (ref 150–400)
RBC: 4.63 MIL/uL (ref 4.22–5.81)
RDW: 14.4 % (ref 11.5–15.5)
WBC: 9.7 10*3/uL (ref 4.0–10.5)
nRBC: 0 % (ref 0.0–0.2)

## 2019-11-19 LAB — BASIC METABOLIC PANEL WITH GFR
Anion gap: 7 (ref 5–15)
BUN: 35 mg/dL — ABNORMAL HIGH (ref 8–23)
CO2: 24 mmol/L (ref 22–32)
Calcium: 9 mg/dL (ref 8.9–10.3)
Chloride: 109 mmol/L (ref 98–111)
Creatinine, Ser: 1.44 mg/dL — ABNORMAL HIGH (ref 0.61–1.24)
GFR calc Af Amer: 54 mL/min — ABNORMAL LOW (ref >60)
GFR calc non Af Amer: 47 mL/min — ABNORMAL LOW (ref >60)
Glucose, Bld: 118 mg/dL — ABNORMAL HIGH (ref 70–99)
Potassium: 4.7 mmol/L (ref 3.5–5.1)
Sodium: 140 mmol/L (ref 135–145)

## 2019-11-19 IMAGING — XA IR ANGIO INTRA EXTRACRAN SEL COM CAROTID INNOMINATE BILAT MOD SE
1 of 2 series · 11 of 24 positions shown · IV contrast (IODINE)
Comparison: CT angiogram of the head and neck [DATE].

CLINICAL DATA: History of left sided hemiplegia, right-sided gaze
deviation, and right cerebral meningioma. High-grade stenosis of the
right internal carotid artery on CT angiogram of the head and neck.

EXAM:
BILATERAL COMMON CAROTID AND INNOMINATE ANGIOGRAPHY
TECHNIQUE: Informed written consent was obtained from the patient after a
thorough discussion of the procedural risks, benefits and
alternatives. All questions were addressed. Maximal Sterile Barrier
Technique was utilized including caps, mask, sterile gowns, sterile
gloves, sterile drape, hand hygiene and skin antiseptic. A timeout
was performed prior to the initiation of the procedure.

[Series 300: dr. (person_name) · 11 of 231 slices shown]
[im 1/231]
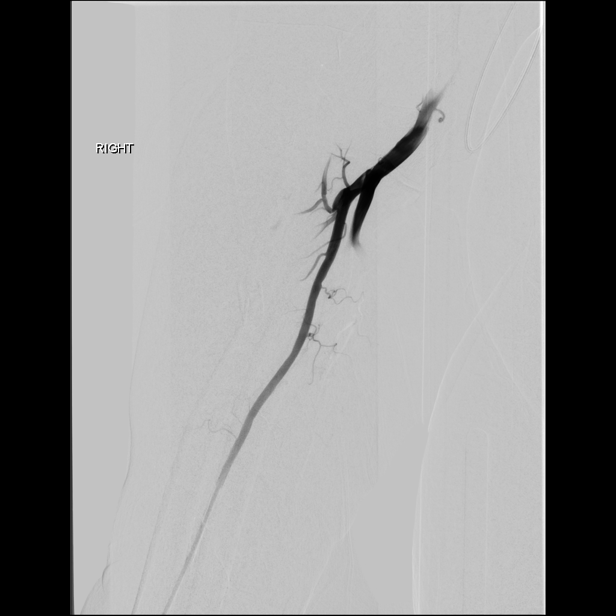
[im 21/231]
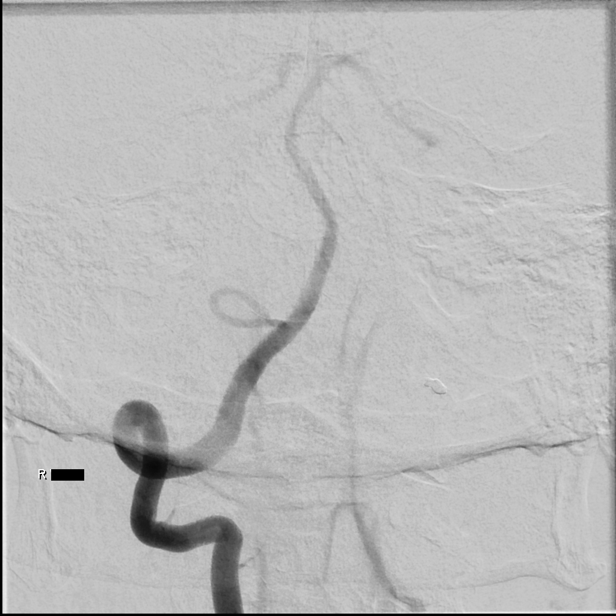
[im 42/231]
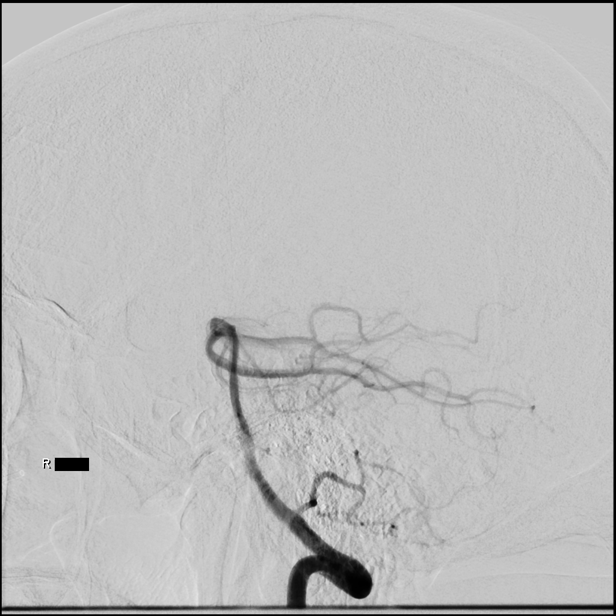
[im 63/231]
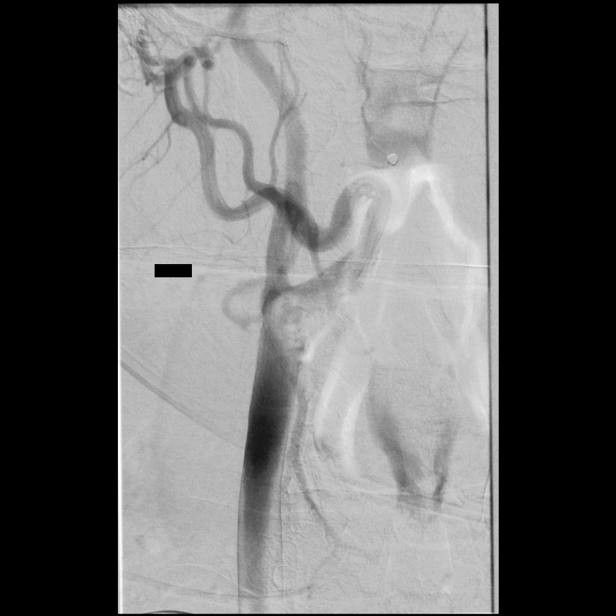
[im 84/231]
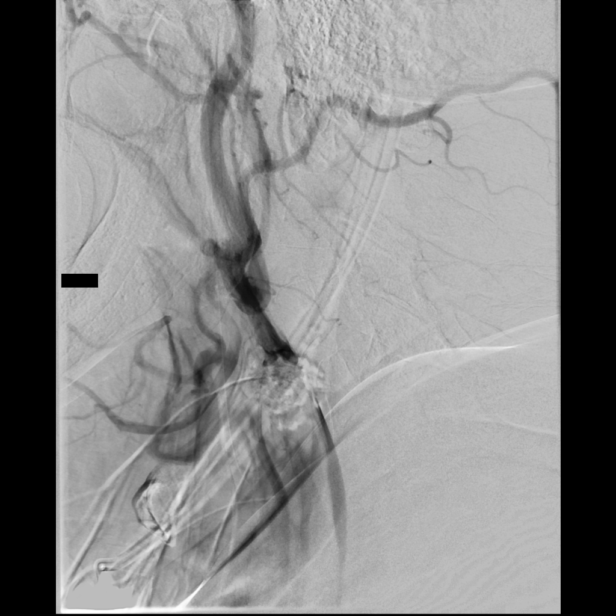
[im 116/231]
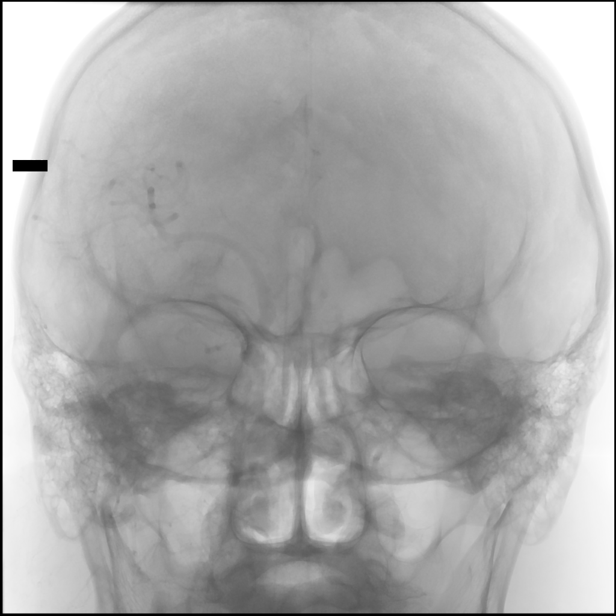
[im 136/231]
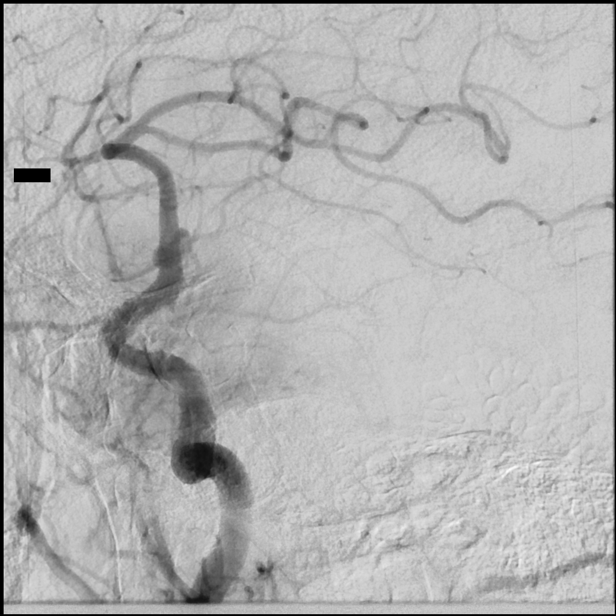
[im 157/231]
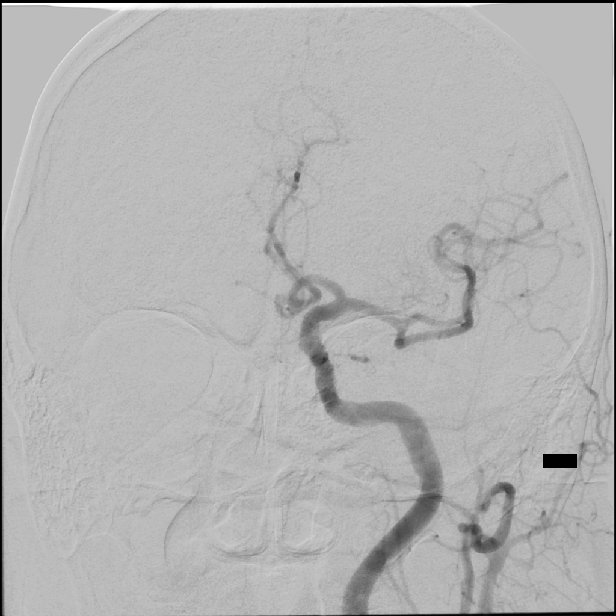
[im 178/231]
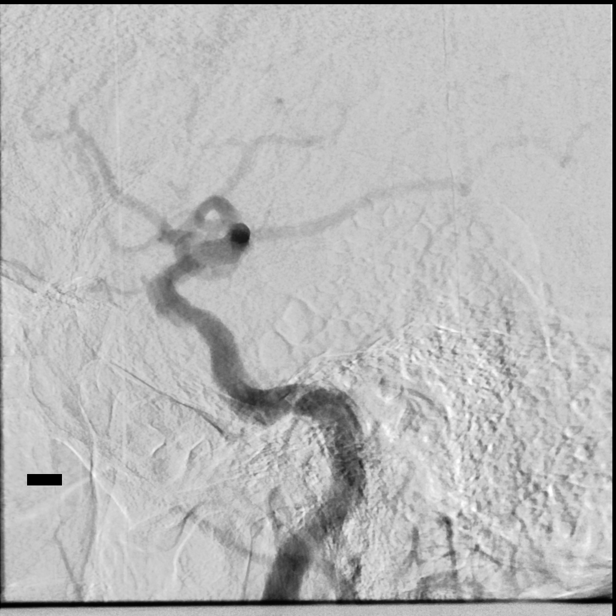
[im 199/231]
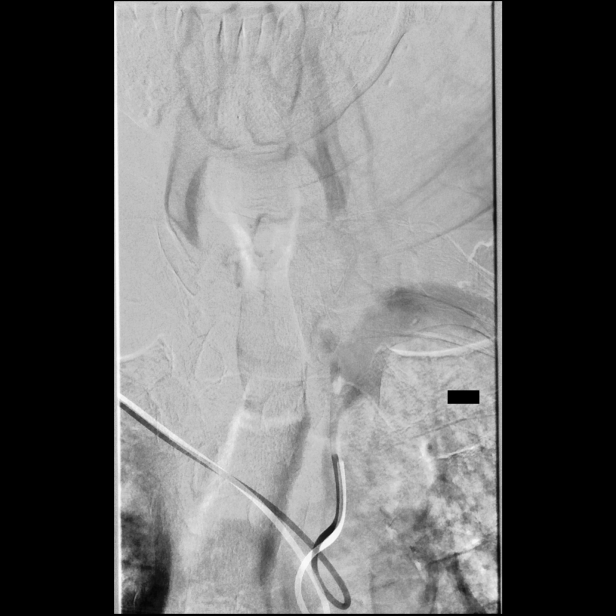
[im 220/231]
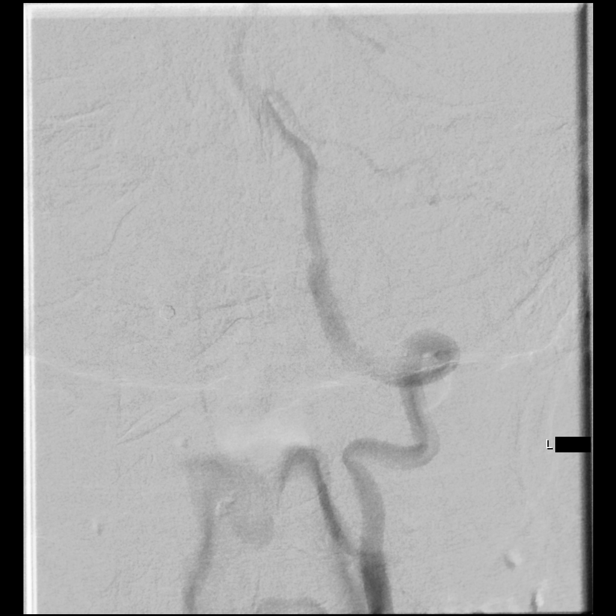

[11 of 24 positions shown; findings below may reference images not displayed]

MEDICATIONS:
Heparin [B6] units IV. No antibiotic was administered within 1 hour
of the procedure.

ANESTHESIA/SEDATION:
Versed 0 mg IV; Fentanyl 12.5 mcg IV

Moderate Sedation Time:  30 minutes

The patient was continuously monitored during the procedure by the
interventional radiology nurse under my direct supervision.

CONTRAST:  Isovue 300 approximately 70 mL

FLUOROSCOPY TIME:  Fluoroscopy Time: 11 minutes 24 seconds ([B6]
mGy).

COMPLICATIONS:
None immediate.
The right forearm to the wrist was prepped and draped in the usual
sterile manner.

The right radial artery was then identified with ultrasound, and its
morphology documented. A dorsal palmar anastomosis was verified to
be present. Using ultrasound guidance, trans radial access was
obtained with a micropuncture set over a 0.018 inch micro guidewire.
A [DATE] French radial sheath was inserted. The obturator, and the
micro guidewire were removed. Good aspiration was obtained from the
side port of the sheath. A cocktail of [B6] units of heparin, and
200 mcg of nitroglycerin was then injected in diluted form through
the sheath without event. A right radial arteriogram was obtained.

Following the procedure, hemostasis at the right radial puncture
site was obtained with a wrist band. Distal right radial pulse was
verified to be present.
FINDINGS: The origin of the right vertebral artery is widely patent.

The vessel opacifies to the cranial skull base. There is
approximately 60% stenosis of the proximal right vertebral artery at
the level of C5-C6. More distally the vessel is seen to opacify to
the cranial skull base. Wide patency is maintained of the right
vertebrobasilar junction and the right posterior-inferior cerebellar
artery.

The basilar artery, the posterior cerebral arteries, the superior
cerebellar arteries and the anterior-inferior cerebellar arteries
opacify into the capillary and venous phases.

Displacement of the right posterior cerebral artery in the P2 P3
regions slightly medially is seen secondary to the more laterally
placed large meningioma. Faint diffuse tumor blush is seen involving
the superolateral aspect of the meningioma. Unopacified blood is
seen in the basilar artery from the contralateral vertebral artery.

Patency of the dural venous sinuses is seen. A right common carotid
arteriogram demonstrates a partially calcified plaque at the level
of the common carotid bifurcation eccentrically positioned into the
right internal carotid artery.

The right external carotid artery and its major branches are widely
patent.

The right internal carotid artery at the bulb demonstrates
approximately 75% stenosis secondary to a partially calcified
atherosclerotic plaque. Moderate tortuosity is noted of the junction
of the middle and the proximal [DATE] of the right internal carotid
artery. Vessel opacifies to the cranial skull base. Patency is
maintained of the petrous, cavernous and supraclinoid segments.
Straightening of the supraclinoid segment is seen related to the
large meningioma lying laterally and superiorly. The right middle
cerebral artery is displaced superiorly and slightly medially with
patency of the trifurcation branches being maintained.

A hypoplastic right anterior cerebral artery A2 segment and distally
is noted to be present. The venous phase demonstrates patency of the
dural venous sinuses.

Prominence with mild tortuosity of the cerebral veins overlying the
mid to posterior frontal regions is seen. Also noted is a diffuse
homogeneous blush involving the large known right hemispheric
meningioma. The left common carotid arteriogram demonstrates the
left external carotid artery origin and its branches to be widely
patent.

The left internal carotid artery at the bulb has a smooth irregular
configuration secondary to a plaque. No evidence of intraluminal
filling defects or of ulcerations is seen.

The vessel is seen to opacify to the cranial skull base. The
petrous, cavernous and supraclinoid segments are widely patent, with
arteriosclerotic irregularity involving the proximal cavernous
segment without significant stenosis.

The left middle cerebral artery and the left anterior cerebral
artery opacify into the capillary and venous phases. Cross-filling
of the right anterior cerebral A2 segment and distally is seen via
the anterior communicating artery.

The left vertebral artery origin demonstrates approximately 50%
stenosis at its origin.

The vessel, otherwise, opacifies widely to the cranial skull base.
No left posterior-inferior cerebral artery is identified. However,
noted is a left anterior-inferior cerebellar artery/posteroinferior
cerebellar artery complex. The opacified portion of the basilar
artery, the posterior cerebral arteries, the superior cerebellar
arteries and the anterior-inferior cerebellar arteries demonstrate
patency with unopacified blood seen in the basilar artery from the
contralateral vertebral vertebral artery.
IMPRESSION: Approximately 75% stenosis of the proximal right ICA at the bulb
secondary to a smooth partially calcified arteriosclerotic plaque in
the proximal right internal carotid artery.

Approximately 60% stenosis of the proximal right vertebral artery,
and of 50% at the origin of the left vertebral artery.

A homogeneous tumor blush involving the right cerebral hemisphere of
the known meningioma.

Mass-effect on the right ICA supraclinoid segment, and the right
middle cerebral artery secondary to the laterally positioned large
tumor with circumferential encasement of the carotid at the level of
the right cavernous sinus.

PLAN:
Discussed findings with referring stroke neurologist. To discuss
endovascular revascularization of the right internal carotid artery
proximally with the patient.

## 2019-11-19 SURGERY — ECHOCARDIOGRAM, TRANSESOPHAGEAL
Anesthesia: Monitor Anesthesia Care

## 2019-11-19 MED ORDER — VERAPAMIL HCL 2.5 MG/ML IV SOLN
INTRAVENOUS | Status: AC
  Filled 2019-11-19: qty 2

## 2019-11-19 MED ORDER — MIDAZOLAM HCL 2 MG/2ML IJ SOLN
INTRAMUSCULAR | Status: AC
  Filled 2019-11-19: qty 2

## 2019-11-19 MED ORDER — DIPHENHYDRAMINE HCL 50 MG/ML IJ SOLN
INTRAMUSCULAR | Status: AC
  Filled 2019-11-19: qty 1

## 2019-11-19 MED ORDER — IOHEXOL 300 MG/ML  SOLN
150.0000 mL | Freq: Once | INTRAMUSCULAR | Status: AC | PRN
  Administered 2019-11-19: 75 mL via INTRA_ARTERIAL

## 2019-11-19 MED ORDER — SODIUM CHLORIDE 0.9 % IV SOLN: INTRAVENOUS | Status: AC

## 2019-11-19 MED ORDER — FENTANYL CITRATE (PF) 100 MCG/2ML IJ SOLN
INTRAMUSCULAR | Status: AC
  Filled 2019-11-19: qty 2

## 2019-11-19 MED ORDER — NITROGLYCERIN 1 MG/10 ML FOR IR/CATH LAB
INTRA_ARTERIAL | Status: AC
  Filled 2019-11-19: qty 10

## 2019-11-19 MED ORDER — LIDOCAINE HCL 1 % IJ SOLN
INTRAMUSCULAR | Status: AC
  Filled 2019-11-19: qty 20

## 2019-11-19 MED ORDER — FENTANYL CITRATE (PF) 100 MCG/2ML IJ SOLN
INTRAMUSCULAR | Status: AC | PRN
  Administered 2019-11-19: 12.5 ug via INTRAVENOUS

## 2019-11-19 MED ORDER — ATROPINE SULFATE 1 MG/10ML IJ SOSY
PREFILLED_SYRINGE | INTRAMUSCULAR | Status: AC
  Filled 2019-11-19: qty 10

## 2019-11-19 MED ORDER — HEPARIN SODIUM (PORCINE) 1000 UNIT/ML IJ SOLN
INTRAMUSCULAR | Status: AC
  Filled 2019-11-19: qty 1

## 2019-11-19 MED ORDER — HEPARIN SODIUM (PORCINE) 1000 UNIT/ML IJ SOLN
INTRAMUSCULAR | Status: AC | PRN
  Administered 2019-11-19: 2000 [IU] via INTRA_ARTERIAL

## 2019-11-19 MED ORDER — SODIUM CHLORIDE (PF) 0.9 % IJ SOLN
INTRAVENOUS | Status: AC | PRN
  Administered 2019-11-19: 200 ug via INTRA_ARTERIAL

## 2019-11-19 NOTE — Consult Note (Signed)
Patient referred for TEE I reviewed his Echo and he has mitral annular calcification with some Nodular thickening of his mitral leaflets This is not a source of embolus Given high grade left carotid stenosis and meningioma do not feel That TEE is indicated Discussed with Dr Leonie Man and procedure cancelled  Charles Rouge MD [facility]

## 2019-11-19 NOTE — Progress Notes (Signed)
STROKE TEAM PROGRESS NOTE   INTERVAL HISTORY Patient is seen in the IR suite after his angiogram.  Angiogram shows severe proximal right ICA stenosis from a calcific plaque with a soft plaque component.  He has no complaints.  Neuro exam is unchanged.  Vital signs stable. TEE is canceled as Charles Sherman reviewed echo films and feels it is calcified mitral annulus rather than a mass Vitals:   11/19/19 1300 11/19/19 1314 11/19/19 1330 11/19/19 1400  BP: (!) 150/65 (!) 142/69 (!) 141/64 (!) (P) 167/74  Pulse: (!) 47 (!) 44 (!) 50   Resp: 14 12    Temp: 98.3 F (36.8 C) 98.3 F (36.8 C) 98.7 F (37.1 C) (P) 98.6 F (37 C)  TempSrc: Axillary Axillary Axillary (P) Axillary  SpO2: 97% 96%     CBC:  Recent Labs  Lab 11/18/19 0512 11/19/19 0550  WBC 11.8* 9.7  HGB 15.2 15.0  HCT 45.7 45.2  MCV 95.0 97.6  PLT 175 562   Basic Metabolic Panel:  Recent Labs  Lab 11/18/19 0512 11/19/19 0550  NA 139 140  K 4.5 4.7  CL 105 109  CO2 25 24  GLUCOSE 115* 118*  BUN 32* 35*  CREATININE 1.43* 1.44*  CALCIUM 9.3 9.0   Lipid Panel:  Recent Labs  Lab 11/15/19 1143  CHOL 189  TRIG 54  HDL 64  CHOLHDL 3.0  VLDL 11  LDLCALC 114*   HgbA1c:  Recent Labs  Lab 11/15/19 1143  HGBA1C 5.4   Urine Drug Screen: No results for input(s): LABOPIA, COCAINSCRNUR, LABBENZ, AMPHETMU, THCU, LABBARB in the last 168 hours.  Alcohol Level No results for input(s): ETH in the last 168 hours.  IMAGING past 24 hours No results found.  PHYSICAL EXAM  Temp:  [97.7 F (36.5 C)-98.8 F (37.1 C)] (P) 98.6 F (37 C) (09/27 1400) Pulse Rate:  [44-60] 50 (09/27 1330) Resp:  [12-20] 12 (09/27 1314) BP: (99-163)/(60-83) (P) 167/74 (09/27 1400) SpO2:  [91 %-100 %] 96 % (09/27 1314)  General -obese elderly African-American male not in acute distress  Ophthalmologic - fundi not visualized due to noncooperation.  Cardiovascular - Regular rhythm and rate.  No murmur or gallop  Neuro - awake alert, eyes  open, follow up simple commands. Oriented to age, time and place.  No dysarthria no aphasia, able to name and repeat simple sentences. Right gaze preference, able to cross midline, left gaze paresis. Eyes conjugate, but limited upper and lower gaze. 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 Left able to count fingers, intact pupillary reflex. Left facial droop. Tongue midline. RUE 5/5, RLE 3+/5.   LUE 2/5 proximal  0/5 finger movement with increased muscle tone. LLE 3-/5 proximal and 3/5 distal DF. Sensation symmetrical per pt, right FTN intact. Gait not tested.   ASSESSMENT/PLAN Charles Sherman is a 74 y.o. male with history of COPD, CHF, HTN, MI, OSA and known meningioma (from 05/2019 with R CRAO, followed by NS w/ scheduled appt for 9/23) presenting to [facility] 9/22 with L facial droop, L sided weakness and dysarthria.   Stroke: Scattered R MCA and ACA infarcts in setting of right ICA bulb and siphon stenosis and right MCA narrowing due to large R meningioma. Can not rule out embolic source.  MRI  [facility]) 9/22 R MCA and ACA scattered infarcts. Elevation of R MCA d/t mass effect. R middle cranial fossa meningioma involving R cavernous sinus and sella turcica unchanged  w/ 15mm L midline shift.  Vasogenic edema on the right brain.  CTA head large R cavernous sinus meningioma extending into prepontine cistern on the R, mass enters the sella. Extensive atherosclerotic calcification B cavernous ICA and supraclinoid ICA. Distal B VA mild stenoses. Right middle cerebral artery is up lifted by tumor.  CT - 11/16/19 -  Acute infarct right MCA territory in the right frontal parietal lobe extending into the occipital lobe is unchanged. 9 mm midline shift to the left unchanged.  Carotid Doppler nondiagnostic of right ICA due to extensive plaque  CTA neck Bulky eccentric plaque at the right bifurcation/proximal right ICA with associated stenosis of up to  65-70%  2D Echo - EF 60 - 65%. Findings concerning for MV mass - TEE recommended  for clarification. Will arrange for Monday. (order placed - message sent to cardiology - pt will be NPO Monday)  LDL 114  HgbA1c 5.4  VTE prophylaxis - lovenox   aspirin 81 mg daily prior to admission, now on aspirin 325 mg daily and plavix DAPT for 3 months and then plavix alone.   Therapy recommendations:  CIR recommended  Disposition:  pending   Meningioma, large  Large R cavernous area extending into prepontine cistern and sella w/ upward displacement of R MCA  Previously not a surgical candidate given R CRAO at time of dx  Currently not a surgical candidate given acute stroke  NS following for possible future removal  Vasogenic edema on Decadron IV  Carotid stenosis Hx of R CRAO  05/2019 right eye vision loss  02/2019 CUS Bilateral carotid atherosclerosis. No hemodynamically significant ICA stenosis.   This admission CUS nondiagnostic of right ICA due to extensive plaque  CTA neck Bulky eccentric plaque at the right bifurcation/proximal right ICA with associated stenosis of up to 65-70%  May consider right CEA in the near future. I have discussed with Charles Sherman and he will see pt   Vasogenic edema  CT and MRI showed right brain vasogenic edema due to tumor  On decadron IV  Put on CBG and SSI  Vomiting 9/23   CT - 11/16/19 - Large right cavernous sinus meningioma unchanged. There is vasogenic edema in the right temporoparietal lobe. 9 mm midline shift to the left unchanged.  Hypertension  Home meds:  HCTZ 25 tid, imdur 120, amlodipine 10, lasix 80 bid, metoprolol 25 bid  On amlodipine 10, metoprolol 25 bid, lasix 40 bid and imdur 120 daily  On hydralazine PRN  BP stable  BP goal < 180/105 - gradually decrease BP to the long term goal . Long-term BP goal 130-150 given ICA siphon and bulb stenosis  Hyperlipidemia  Home meds:  lipitor 40, resumed in hospital  LDL  114, goal < 70  Increase lipitor to 80  Continue statin at discharge  Tobacco abuse  Current smoker  Smoking cessation counseling provided  Pt is willing to quit     Other Stroke Risk Factors  Advanced age  Obesity, recommend weight loss, diet and exercise as appropriate   Coronary artery disease, hx MI w/ PCI x 3  Obstructive sleep apnea, on CPAP at home  Chronic diastolic Congestive heart failure  Other Active Problems  Leukocytosis WBC 8.3->15.1->13.4->11.8  COPD  Bradycardia (40's) (on Lopressor 25 mg Bid home meds)   AKI - creatinine - 1.25 - encourage po intake - on IVF fluids 50 cc / hr ->1.43 -> will increase IV to 75 cc /hr (pt is on Zaroxolyn and Lasix -  CHF hx - may need to decrease dose if creatinine continues to climb)  Hospital day # 5 Plan patient will benefit from elective right carotid angioplasty stenting to reduce risk for future strokes.  Discussed with Charles Sherman and patient and plan to do this in the next few days.  Will discuss with Dr. Ellene Route. Continue aspirin and Plavix.  Continue Decadron and management of meningioma as per neurosurgery.    Greater than 50% time during this 25-minute visit was spent on counseling and coordination of care and about his stroke and meningioma and discussion with care team and answering questions Antony Contras, MD   To contact Stroke Continuity provider, please refer to [email or web address] After hours, contact General Neurology

## 2019-11-19 NOTE — Progress Notes (Signed)
PT Cancellation Note  Patient Details Name: Charles Sherman MRN: [id number] DOB: September 06, 1945   Cancelled Treatment:    Reason Eval/Treat Not Completed: Medical issues which prohibited therapy; patient s/p arteriogram and RN not sure how long for bedrest.  Will attempt again another day.   Reginia Naas 11/19/2019, 3:54 PM  Magda Kiel, PT Acute Rehabilitation Services [phone] Office:[phone] 11/19/2019

## 2019-11-19 NOTE — Procedures (Signed)
S.P 4 vessel cerebral artrriogram RT radial approach. Findings. 1.high grade stenosis of approximately 75 % associated with a soft smooth plaque. 2.Approx 60 % stenosis of RT VA prox and 60 % 77f Lt VA origin. 3.Mass effect on RT MCA secondary to the large  sphenoid wing meningioma without stenosis or intraluminal filling defects. 4.Straightening of RT ICA supraclinoid seg due to encasement by the meningioma in the  Right cavernous sinus. 5.Dural venous sinuses patent. S.Yitta [email or web address].MD

## 2019-11-19 NOTE — Sedation Documentation (Signed)
Pt not sedated only given fentanyl

## 2019-11-19 NOTE — Progress Notes (Signed)
  Speech Language Pathology Treatment: Dysphagia  Patient Details Name: Charles Sherman MRN: [id number] DOB: [birth year] Today's Date: 11/19/2019 Time: 1456-1530 SLP Time Calculation (min) (ACUTE ONLY): 34 min  Assessment / Plan / Recommendation Clinical Impression  Pt was seen with his lunch tray, with diet advanced to regular solids after being NPO for procedure this morning. Even when given small, single bites of soft solids, pt is noted to have L buccal pocketing that builds up in between bites. He acknowledges that it is there, but that he has difficulty with clearing it. Adding condiments to make fish less dry did not facilitate oral clearance, but chopping the food, mixing it with purees, and providing Mod cues did. He also had intermittent liquid washes, which elicited an immediate cough x1 across the whole meal. Discussed recommendation to go back to his chopped diet, and pt is agreeable. Will adjust diet and continue to follow. He is very motivated to get better and would benefit from CIR level therapy post-discharge.    HPI HPI: Pt is a 74 y.o. 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 PMH also includes: OSA, MI, HTN, COPD, CHF      SLP Plan  Continue with current plan of care       Recommendations  Diet recommendations: Dysphagia 2 (fine chop);Thin liquid Liquids provided via: Straw Medication Administration: Whole meds with puree Supervision: Staff to assist with self feeding Compensations: Slow rate;Small sips/bites;Lingual sweep for clearance of pocketing Postural Changes and/or Swallow Maneuvers: Seated upright 90 degrees                Oral Care Recommendations: Oral care BID Follow up Recommendations: Inpatient Rehab SLP Visit Diagnosis: Dysphagia, oral phase (R13.11) Plan: Continue with current plan of care       GO                Osie Bond., M.A.  [facility] Pager [phone] Office [phone]  11/19/2019, 3:37 PM

## 2019-11-19 NOTE — Care Management (Signed)
Attempted to speak with patient regarding option for SNF but patient was post procedure and asleep.  Attempted to call wife to discuss but was unable to reach her.

## 2019-11-19 NOTE — Consult Note (Signed)
Physical Medicine and Rehabilitation Consult Reason for Consult: Left side weakness Referring Physician: Dr. Ellene Route   HPI: Charles Sherman is a 74 y.o. 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 Per chart review patient lives with spouse. 1 level home 2 steps to entry. Reportedly independent prior to admission and active. Presented 11/14/2019 with left-sided weakness. Cranial CT scan showed acute infarct right MCA territory in the right frontal parietal lobe extending into the occipital lobe. No acute hemorrhage. 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 There was some vasogenic edema in the right temporal parietal lobe 9 mm midline shift to the left again unchanged. CT angiogram of head and neck with extensive atherosclerotic calcification in the cavernous carotids bilaterally with mild stenosis. The tumor abuts the right cavernous and supraclinoid internal carotid artery without encasement or occlusion. Neurosurgery did review scans no current plan for arteriography or stenting at this time. Admission chemistries glucose 121, WBC 15,000. Echocardiogram showed no significant regurgitation or destruction of the valve to suggest endocarditis. Left ventricular ejection fraction 60 to 65% no wall motion abnormalities grade 1 diastolic dysfunction. No current plan for TEE. Cerebral angiogram completed showing high-grade stenosis of approximately 75% associated with soft smooth plaque. Approximate 60% stenosis of right VA proximal and 60% of Lt VA origin. Mass-effect on right MCA secondary to large sphenoid wing meningioma without stenosis or intraluminal filling defect. Patient currently remains on aspirin and Plavix therapy. Subcutaneous Lovenox for DVT prophylaxis. Decadron protocol as indicated. Therapy evaluations completed with  recommendations of physical medicine rehab consult.   Review of Systems  Constitutional: Negative for chills and fever.  HENT: Negative for hearing loss.   Eyes: Positive for blurred vision. Negative for double vision.  Respiratory: Negative for cough and shortness of breath.   Cardiovascular: Positive for leg swelling. Negative for chest pain and palpitations.  Gastrointestinal: Positive for constipation. Negative for heartburn, nausea and vomiting.  Genitourinary: Negative for dysuria, flank pain and hematuria.  Musculoskeletal: Positive for myalgias.  Skin: Negative for rash.  Neurological: Positive for weakness.  All other systems reviewed and are negative.  Past Medical History:  Diagnosis Date  . CHF (congestive heart failure) ([place])   . COPD (chronic obstructive pulmonary disease) ([place])   . HTN (hypertension)   . Myocardial infarct ([place])   . OSA (obstructive sleep apnea)    on CPAP   Past Surgical History:  Procedure Laterality Date  . PERCUTANEOUS CORONARY STENT INTERVENTION (PCI-S)     Family History  Problem Relation Age of Onset  . Alzheimer's disease Mother   . Heart disease Father   . Diabetes Sister   . Stomach cancer Brother    Social History:  reports that he has been smoking cigarettes. He has been smoking about 0.50 packs per day. He has never used smokeless tobacco. No history on file for alcohol use and drug use. Allergies:  Allergies  Allergen Reactions  . Shrimp [Shellfish Allergy]    Medications Prior to Admission  Medication Sig Dispense Refill  . acetaminophen (TYLENOL) 500 MG tablet Take 500 mg by mouth every 6 (six) hours as needed for mild pain or headache.    . albuterol (VENTOLIN HFA) 108 (90 Base) MCG/ACT inhaler Inhale 2 puffs into the lungs every 6 (six) hours as needed for wheezing or shortness of breath.    Marland Kitchen amLODipine (NORVASC) 10 MG  tablet Take 10 mg by mouth daily.     Marland Kitchen aspirin EC 81 MG tablet Take 81 mg by mouth daily.    Marland Kitchen  atorvastatin (LIPITOR) 40 MG tablet Take 40 mg by mouth daily.    Marland Kitchen docusate sodium (COLACE) 100 MG capsule Take 100 mg by mouth daily as needed for mild constipation.    . furosemide (LASIX) 80 MG tablet Take 80 mg by mouth 2 (two) times daily.    . hydrochlorothiazide (HYDRODIURIL) 25 MG tablet Take 25 mg by mouth 3 (three) times daily. Do not take if SBP is <100    . isosorbide mononitrate (IMDUR) 120 MG 24 hr tablet Take 120 mg by mouth daily.    . lansoprazole (PREVACID) 15 MG capsule Take 15 mg by mouth daily as needed (acid reflux).     . metolazone (ZAROXOLYN) 2.5 MG tablet Take 2.5 mg by mouth once a week. On Mondays 30 minutes before furosemide. May take twice daily as needed for edema    . metoprolol tartrate (LOPRESSOR) 25 MG tablet Take 1 tablet (25 mg total) by mouth 2 (two) times daily. 180 tablet 3  . Multiple Vitamin (MULTIVITAMIN) tablet Take 1 tablet by mouth daily.    . nitroGLYCERIN (NITROSTAT) 0.4 MG SL tablet Place 0.4 mg under the tongue every 5 (five) minutes x 3 doses as needed for chest pain (If no relief after 3 rd dose report to the ED).    Marland Kitchen nystatin cream (MYCOSTATIN) Apply 1 application topically 2 (two) times daily as needed for dry skin. 15 gm    . potassium chloride (KLOR-CON) 10 MEQ tablet Take 20-30 mEq by mouth See admin instructions. Taking 2 tablets (19meg) twice daily     Except take three tablets ( 30 meq) twice day on Mondays only      Home: Home Living Family/patient expects to be discharged to:: Inpatient rehab Living Arrangements: Spouse/significant other Available Help at Discharge: Family, Available 24 hours/day Type of Home: House Home Access: Stairs to enter [employer] of Steps: 2 Entrance Stairs-Rails: None Home Layout: One level, Laundry or work area in basement [employer] Shower/Tub: Chiropodist: [place]: Environmental consultant - 2 wheels, Radio producer - single point  Functional History: Prior Function Level of  Independence: Independent Comments: pt reports he enjoys going fishing Functional Status:  Mobility: Bed Mobility Overal bed mobility: Needs Assistance Bed Mobility: Supine to Sit Supine to sit: Max assist, +2 for physical assistance, HOB elevated Sit to supine: +2 for physical assistance, Max assist General bed mobility comments: Pt needs A for both legs and trunk Transfers Overall transfer level: Needs assistance Equipment used: 2 person hand held assist Transfers: Sit to/from Stand, Stand Pivot Transfers Sit to Stand: Max assist, +2 physical assistance Stand pivot transfers: Max assist, +2 physical assistance General transfer comment: pt requires significant assistance and tactile cueing due to left lateral lean. Pt with noted L knee and LE hyperextension due to increased tone when standing. Pt unable to lift LLE to advance, instead drags LE with PT/OT facilitation of weight shift and pivot      ADL: ADL Overall ADL's : Needs assistance/impaired Eating/Feeding: Total assistance, Sitting Grooming: Wash/dry hands, Set up, Supervision/safety Grooming Details (indicate cue type and reason): supported sitting Upper Body Bathing: Maximal assistance Upper Body Bathing Details (indicate cue type and reason): supported sitting Lower Body Bathing: Total assistance Lower Body Bathing Details (indicate cue type and reason): Max A +2 sit<>stand Upper Body Dressing : Total  assistance Upper Body Dressing Details (indicate cue type and reason): supported sitting Lower Body Dressing: Total assistance Lower Body Dressing Details (indicate cue type and reason): Max A +2 sit<>stand Toilet Transfer: Maximal assistance, +2 for physical assistance, Stand-pivot Toileting- Clothing Manipulation and Hygiene: Total assistance Toileting - Clothing Manipulation Details (indicate cue type and reason): Max A +2 sit<>stand  Cognition: Cognition Overall Cognitive Status: Impaired/Different from  baseline Arousal/Alertness: Lethargic Orientation Level: Oriented X4 Attention: Sustained Sustained Attention: Impaired Sustained Attention Impairment: Verbal basic Memory: Impaired Memory Impairment: Retrieval deficit, Decreased short term memory Decreased Short Term Memory: Verbal basic (4/4 immediate recall; 2/4 delayed; 4/4 with cues) Awareness: Impaired Awareness Impairment: Intellectual impairment Safety/Judgment: Impaired Cognition Arousal/Alertness: Awake/alert Behavior During Therapy: Flat affect Overall Cognitive Status: Impaired/Different from baseline Area of Impairment: Attention, Memory, Following commands, Safety/judgement, Awareness, Problem solving Orientation Level: Disoriented to, Place Current Attention Level: Sustained Memory: Decreased recall of precautions, Decreased short-term memory Following Commands: Follows one step commands with increased time Safety/Judgement: Decreased awareness of safety, Decreased awareness of deficits Awareness: Intellectual Problem Solving: Slow processing, Decreased initiation, Difficulty sequencing, Requires verbal cues, Requires tactile cues  Blood pressure (!) 150/66, pulse (!) 52, temperature 97.7 F (36.5 C), temperature source Axillary, resp. rate 14, SpO2 91 %. Physical Exam General: Alert, No apparent distress HEENT: Eyes are jaundiced, blurry vision Neck: Supple without JVD or lymphadenopathy Heart: Reg rate and rhythm. No murmurs rubs or gallops Chest: CTA bilaterally without wheezes, rales, or rhonchi; no distress Abdomen: Soft, non-tender, non-distended, bowel sounds positive. Extremities: No clubbing, cyanosis, or edema. Pulses are 2+ Skin: Clean and intact without signs of breakdown Neuro: Left facial droop, dysarthria. Patient is alert in no acute distress. Noted right gaze preference. Provides name age and date of birth. Follows simple commands. Unable to test right arm due to angiography restrictions. Left arm  with no movement, but is bogged down by electrodes. Bilateral LE with at least 3/5 strength- limited by fatigue Psych: Pt's affect is appropriate. Pt is cooperative  Results for orders placed or performed during the hospital encounter of 11/14/19 (from the past 24 hour(s))  Glucose, capillary     Status: Abnormal   Collection Time: 11/18/19  3:11 PM  Result Value Ref Range   Glucose-Capillary 128 (H) 70 - 99 mg/dL  Glucose, capillary     Status: Abnormal   Collection Time: 11/18/19 11:37 PM  Result Value Ref Range   Glucose-Capillary 111 (H) 70 - 99 mg/dL  CBC     Status: None   Collection Time: 11/19/19  5:50 AM  Result Value Ref Range   WBC 9.7 4.0 - 10.5 K/uL   RBC 4.63 4.22 - 5.81 MIL/uL   Hemoglobin 15.0 13.0 - 17.0 g/dL   HCT 45.2 39 - 52 %   MCV 97.6 80.0 - 100.0 fL   MCH 32.4 26.0 - 34.0 pg   MCHC 33.2 30.0 - 36.0 g/dL   RDW 14.4 11.5 - 15.5 %   Platelets 169 150 - 400 K/uL   nRBC 0.0 0.0 - 0.2 %  Basic metabolic panel     Status: Abnormal   Collection Time: 11/19/19  5:50 AM  Result Value Ref Range   Sodium 140 135 - 145 mmol/L   Potassium 4.7 3.5 - 5.1 mmol/L   Chloride 109 98 - 111 mmol/L   CO2 24 22 - 32 mmol/L   Glucose, Bld 118 (H) 70 - 99 mg/dL   BUN 35 (H) 8 - 23 mg/dL  Creatinine, Ser 1.44 (H) 0.61 - 1.24 mg/dL   Calcium 9.0 8.9 - 10.3 mg/dL   GFR calc non Af Amer 47 (L) >60 mL/min   GFR calc Af Amer 54 (L) >60 mL/min   Anion gap 7 5 - 15  Glucose, capillary     Status: Abnormal   Collection Time: 11/19/19 12:35 PM  Result Value Ref Range   Glucose-Capillary 109 (H) 70 - 99 mg/dL   No results found.   Assessment/Plan: Diagnosis: Multiple right MCA acute infarcts 1. Does the need for close, 24 hr/day medical supervision in concert with the patient's rehab needs make it unreasonable for this patient to be served in a less intensive setting? Yes 2. Co-Morbidities requiring supervision/potential complications: left facial droop, dysarthria, LUE  flaccidity, LLE weakness, blurry vision, jaundice, CHF, COPD, HTN, MI, OSA 3. Due to bladder management, bowel management, safety, skin/wound care, disease management, medication administration, pain management and patient education, does the patient require 24 hr/day rehab nursing? Yes 4. Does the patient require coordinated care of a physician, rehab nurse, therapy disciplines of PT, OT, SLP to address physical and functional deficits in the context of the above medical diagnosis(es)? Yes Addressing deficits in the following areas: balance, endurance, locomotion, strength, transferring, bowel/bladder control, bathing, dressing, feeding, grooming, toileting, cognition and psychosocial support 5. Can the patient actively participate in an intensive therapy program of at least 3 hrs of therapy per day at least 5 days per week? Yes 6. The potential for patient to make measurable gains while on inpatient rehab is good 7. Anticipated functional outcomes upon discharge from inpatient rehab are min assist  with PT, min assist with OT, modified independent with SLP. 8. Estimated rehab length of stay to reach the above functional goals is: 2-3 weeks 9. Anticipated discharge destination: Home 10. Overall Rehab/Functional Prognosis: good  RECOMMENDATIONS: This patient's condition is appropriate for continued rehabilitative care in the following setting: CIR Patient has agreed to participate in recommended program. Yes Note that insurance prior authorization may be required for reimbursement for recommended care.  Comment: Thank you for this consult. Admission coordinator to follow.   I have personally performed a face to face diagnostic evaluation, including, but not limited to relevant history and physical exam findings, of this patient and developed relevant assessment and plan.  Additionally, I have reviewed and concur with the physician assistant's documentation above.  Charles Cha, MD  Charles Sherman  Charles Sharon, PA-C 11/19/2019

## 2019-11-19 NOTE — Progress Notes (Signed)
Providing Compassionate, Quality Care - Together   Neurosurgery Service Progress Note  Subjective: Patient was not in the room; currently in IR for cerebral arteriogram.   Per nursing staff , no new neuro changes.   Objective: Vitals:   11/19/19 0000 11/19/19 0345 11/19/19 0425 11/19/19 0740  BP:  124/64  (!) 148/73  Pulse:  (!) 47  (!) 48  Resp:  15  20  Temp: 98.8 F (37.1 C)  97.7 F (36.5 C) 97.9 F (36.6 C)  TempSrc:   Oral Oral  SpO2:  99%  95%   Temp (24hrs), Avg:98.3 F (36.8 C), Min:97.7 F (36.5 C), Max:98.8 F (37.1 C)  CBC Latest Ref Rng & Units 11/19/2019 11/18/2019 11/17/2019  WBC 4.0 - 10.5 K/uL 9.7 11.8(H) 13.4(H)  Hemoglobin 13.0 - 17.0 g/dL 15.0 15.2 15.7  Hematocrit 39 - 52 % 45.2 45.7 46.8  Platelets 150 - 400 K/uL 169 175 182   BMP Latest Ref Rng & Units 11/19/2019 11/18/2019 11/17/2019  Glucose 70 - 99 mg/dL 118(H) 115(H) 110(H)  BUN 8 - 23 mg/dL 35(H) 32(H) 25(H)  Creatinine 0.61 - 1.24 mg/dL 1.44(H) 1.43(H) 1.25(H)  Sodium 135 - 145 mmol/L 140 139 141  Potassium 3.5 - 5.1 mmol/L 4.7 4.5 4.4  Chloride 98 - 111 mmol/L 109 105 105  CO2 22 - 32 mmol/L 24 25 26   Calcium 8.9 - 10.3 mg/dL 9.0 9.3 9.5    Intake/Output Summary (Last 24 hours) at 11/19/2019 1040 Last data filed at 11/18/2019 1500 Gross per 24 hour  Intake --  Output 500 ml  Net -500 ml    CT ANGIO HEAD W OR WO CONTRAST  Result Date: 11/14/2019 CLINICAL DATA:  Acute neuro deficit. Rule out stroke. Intracranial mass lesion. EXAM: CT ANGIOGRAPHY HEAD TECHNIQUE: Multidetector CT imaging of the head was performed using the standard protocol during bolus administration of intravenous contrast. Multiplanar CT image reconstructions and MIPs were obtained to evaluate the vascular anatomy. CONTRAST:  54mL OMNIPAQUE IOHEXOL 350 MG/ML SOLN COMPARISON:  MRI head 11/14/2019 FINDINGS: CT HEAD Brain: Large mass arising from the right cavernous sinus with peripheral calcification. This showed  enhancement on MRI and is most consistent with a large meningioma. There is extension into the prepontine cistern on the right with mild mass-effect on the ventral pons on the right. There is surrounding white matter edema and mass-effect. 7 mm midline shift to the left unchanged. Tumor extends into the sella. Negative for acute infarct or hemorrhage. Patchy white matter hypodensity bilaterally most consistent with chronic microvascular ischemia as well as vasogenic edema from the tumor on the right. Vascular: Atherosclerotic calcification in the distal vertebral and cavernous carotid arteries bilaterally. Negative for hyperdense vessel Skull: Negative Sinuses: Mild mucosal edema paranasal sinuses. Orbits: Bilateral proptosis.  No orbital mass. CTA HEAD Anterior circulation: Heavy calcification throughout the cavernous carotid bilaterally with mild stenosis bilaterally. Right cavernous sinus meningioma abuts the supraclinoid internal carotid artery without encasement or narrowing. Right middle cerebral artery is up lifted by tumor. Anterior and middle cerebral arteries patent bilaterally without stenosis or large vessel occlusion. Posterior circulation: Atherosclerotic calcification and mild stenosis distal vertebral artery bilaterally. MRI abnormality distal left vertebral artery corresponding to calcified plaque. No thrombus or filling defect. PICA patent bilaterally. Basilar is patent bilaterally. AICA patent bilaterally. The mass abuts the superior cerebellar artery on the right without occlusion. Posterior cerebral arteries patent bilaterally without occlusion or stenosis. Venous sinuses: Normal venous enhancement. Anatomic variants: None IMPRESSION: 1. Large meningioma  right cavernous sinus extending into the prepontine cistern on the right. Mass enters the sella. 2. Extensive atherosclerotic calcification in the cavernous carotid bilaterally with mild stenosis. The tumor abuts the right cavernous and  supraclinoid internal carotid artery without encasement or occlusion. 3. Mild calcific stenosis in the distal vertebral artery bilaterally. No thrombus identified in the left vertebral artery as question on MRI. Electronically Signed   By: Franchot Gallo M.D.   On: 11/14/2019 21:35   CT HEAD WO CONTRAST  Result Date: 11/16/2019 CLINICAL DATA:  Stroke follow-up EXAM: CT HEAD WITHOUT CONTRAST TECHNIQUE: Contiguous axial images were obtained from the base of the skull through the vertex without intravenous contrast. COMPARISON:  CT head 11/14/2019.  MRI head 11/14/2019 FINDINGS: Brain: Hypodensity in the right frontal parietal cortex compatible with acute infarct as noted on MRI. This extends into the right occipital lobe. No associated hemorrhage. Chronic microvascular ischemic changes in the white matter. 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 This extends into the prepontine cistern with mild mass-effect on the pons on the right. There is surrounding vasogenic edema in the right temporoparietal lobe. Vascular: Negative for hyperdense vessel Skull: Negative Sinuses/Orbits: Mild mucosal edema paranasal sinuses. Negative orbit. Other: None IMPRESSION: Acute infarct right MCA territory in the right frontal parietal lobe extending into the occipital lobe is unchanged. No acute hemorrhage Large right cavernous sinus meningioma unchanged. There is vasogenic edema in the right temporoparietal lobe. 9 mm midline shift to the left unchanged. Electronically Signed   By: Franchot Gallo M.D.   On: 11/16/2019 11:38   CT ANGIO NECK W OR WO CONTRAST  Result Date: 11/16/2019 CLINICAL DATA:  Follow-up examination for acute stroke. EXAM: CT ANGIOGRAPHY NECK TECHNIQUE: Multidetector CT imaging of the neck was performed using the standard protocol during bolus administration of intravenous contrast. Multiplanar CT image reconstructions and MIPs were obtained to evaluate the vascular anatomy. Carotid stenosis  measurements (when applicable) are obtained utilizing NASCET criteria, using the distal internal carotid diameter as the denominator. CONTRAST:  120mL OMNIPAQUE IOHEXOL 350 MG/ML SOLN COMPARISON:  Prior CTA from 11/14/2019. FINDINGS: Aortic arch: Examination technically limited by positioning. Visualized aortic arch normal in caliber with normal branch pattern. Mild-to-moderate atheromatous change about the arch and origin of the great vessels. No hemodynamically significant stenosis. Right carotid system: Right common carotid artery patent from its origin to the bifurcation without stenosis. Bulky eccentric plaque at the right bifurcation/proximal right ICA with associated stenosis of up to 65-70% by NASCET criteria. Right ICA tortuous but otherwise patent to the skull base without stenosis, dissection or occlusion. Left carotid system: Left CCA patent from its origin to the bifurcation without stenosis. Scattered calcified plaque about the left bifurcation/proximal left ICA with no more than mild 10-20% stenosis by NASCET criteria. Left ICA tortuous but otherwise widely patent to the skull base without stenosis, dissection or occlusion. Vertebral arteries: Both vertebral arteries arise from the subclavian arteries. No proximal subclavian artery stenosis. Mild scattered plaque about the origins and proximal vertebral arteries without hemodynamically significant stenosis. Vertebral arteries patent within the neck without significant stenosis, dissection, or occlusion. Skeleton: No visible acute osseous abnormality, although evaluation limited by positioning. No discrete or worrisome osseous lesions. Mild multilevel cervical spondylosis, most pronounced at C5-6. Other neck: No other acute soft tissue abnormality within the neck. No visible mass or adenopathy. Upper chest: Scattered atelectatic changes noted within the visualized lungs. A degree of pulmonary interstitial congestion may be present as well. No other  definite  acute abnormality. IMPRESSION: 1. Bulky eccentric plaque at the right bifurcation/proximal right ICA with associated stenosis of up to 65-70% by NASCET criteria. 2. Mild atheromatous change about the left carotid bifurcation/proximal left ICA with no more than mild 10-20% stenosis by NASCET criteria. 3. Wide patency of both vertebral arteries within the neck. 4. Diffuse tortuosity of the major arterial vasculature of the neck, suggesting chronic underlying hypertension. Electronically Signed   By: Jeannine Boga M.D.   On: 11/16/2019 02:05   ECHOCARDIOGRAM COMPLETE  Result Date: 11/16/2019    ECHOCARDIOGRAM REPORT   Patient Name:   Charles [facility] Date of Exam: 11/16/2019 Medical Rec #:  [id number]             Height:       71.0 in Accession #:    [id number]            Weight:       262.4 lb Date of Birth:  16-Jun-1945              BSA:          2.367 m Patient Age:    40 years              BP:           191/98 mmHg Patient Gender: M                     HR:           52 bpm. Exam Location:  Inpatient Procedure: 2D Echo, Cardiac Doppler and Color Doppler Indications:    CVA  History:        Patient has prior history of Echocardiogram examinations, most                 recent 03/28/2019. CHF, Previous Myocardial Infarction, COPD; Risk                 Factors:Current Smoker and Hypertension.  Sonographer:    Dustin Flock Referring Phys: [id number] [place]  1. There is a small calcified mass attached to the anterior MV leaflet. It is moves with the leaflet. It measures 0.4 cm x 0.7 cm. It is best seen in the PLAX view. 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 There is no significant regurgitation or destruction of the valve to suggest endocarditis. 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 Would recommend a TEE for better characterization in the setting of recent stroke. The mitral valve is degenerative. Trivial mitral valve regurgitation.  No evidence of mitral stenosis.  2. Left ventricular ejection fraction, by estimation, is 60 to 65%. The left ventricle has normal function. The left ventricle has no regional wall motion abnormalities. There is mild concentric left ventricular hypertrophy. Left ventricular diastolic parameters are consistent with Grade I diastolic dysfunction (impaired relaxation).  3. Right ventricular systolic function is normal. The right ventricular size is normal. Tricuspid regurgitation signal is inadequate for assessing PA pressure.  4. Left atrial size was mildly dilated.  5. The aortic valve is tricuspid. There is mild calcification of the aortic valve. There is mild thickening of the aortic valve. Aortic valve regurgitation is not visualized. Mild aortic valve sclerosis is present, with no evidence of aortic valve stenosis.  6. The inferior vena cava is normal in size with greater than 50% respiratory variability, suggesting right atrial pressure of 3 mmHg. Conclusion(s)/Recommendation(s): Findings concerning for MV mass, would recommend Transesophageal Echocardiogram for clarification.  FINDINGS  Left Ventricle: Left ventricular ejection fraction, by estimation, is 60 to 65%. The left ventricle has normal function. The left ventricle has no regional wall motion abnormalities. The left ventricular internal cavity size was normal in size. There is  mild concentric left ventricular hypertrophy. Left ventricular diastolic parameters are consistent with Grade I diastolic dysfunction (impaired relaxation). Normal left ventricular filling pressure. Right Ventricle: The right ventricular size is normal. No increase in right ventricular wall thickness. Right ventricular systolic function is normal. Tricuspid regurgitation signal is inadequate for assessing PA pressure. Left Atrium: Left atrial size was mildly dilated. Right Atrium: Right atrial size was normal in size. Pericardium: Trivial pericardial effusion is present. Mitral  Valve: There is a small calcified mass attached to the anterior MV leaflet. It is moves with the leaflet. It measures 0.4 cm x 0.7 cm. It is best seen in the PLAX view. 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 There is no significant regurgitation or destruction of the valve to suggest endocarditis. 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 Would recommend a TEE for better characterization in the setting of recent stroke. The mitral valve is degenerative in appearance. There is mild calcification of the anterior and posterior mitral valve leaflet(s). Trivial mitral valve regurgitation. No evidence of mitral valve stenosis. Tricuspid Valve: The tricuspid valve is grossly normal. Tricuspid valve regurgitation is trivial. No evidence of tricuspid stenosis. Aortic Valve: The aortic valve is tricuspid. There is mild calcification of the aortic valve. There is mild thickening of the aortic valve. Aortic valve regurgitation is not visualized. Mild aortic valve sclerosis is present, with no evidence of aortic valve stenosis. Pulmonic Valve: The pulmonic valve was grossly normal. Pulmonic valve regurgitation is not visualized. No evidence of pulmonic stenosis. Aorta: The aortic root is normal in size and structure. Venous: The inferior vena cava is normal in size with greater than 50% respiratory variability, suggesting right atrial pressure of 3 mmHg. IAS/Shunts: The atrial septum is grossly normal.  LEFT VENTRICLE PLAX 2D LVIDd:         4.50 cm  Diastology LVIDs:         3.10 cm  LV e' medial:    5.87 cm/s LV PW:         1.20 cm  LV E/e' medial:  10.9 LV IVS:        1.20 cm  LV e' lateral:   7.72 cm/s LVOT diam:     2.50 cm  LV E/e' lateral: 8.3 LV SV:         135 LV SV Index:   57 LVOT Area:     4.91 cm  RIGHT VENTRICLE RV Basal diam:  3.40 cm RV S prime:     18.00 cm/s TAPSE (M-mode): 3.8 cm LEFT ATRIUM           Index       RIGHT ATRIUM           Index LA diam:      4.90 cm  2.07 cm/m  RA Area:     21.30 cm LA Vol (A2C): 61.8 ml 26.11 ml/m RA Volume:   61.30 ml  25.90 ml/m LA Vol (A4C): 96.9 ml 40.95 ml/m  AORTIC VALVE LVOT Vmax:   110.00 cm/s LVOT Vmean:  72.600 cm/s LVOT VTI:    0.275 m  AORTA Ao Root diam: 3.40 cm MITRAL VALVE MV Area (PHT): 3.72 cm    SHUNTS MV Decel Time: 204 msec  Systemic VTI:  0.28 m MV E velocity: 63.80 cm/s  Systemic Diam: 2.50 cm MV A velocity: 55.30 cm/s MV E/A ratio:  1.15 Eleonore Chiquito MD Electronically signed by Eleonore Chiquito MD Signature Date/Time: 11/16/2019/11:52:00 AM    Final    VAS US CAROTID  Result Date: 11/15/2019 Carotid Arterial Duplex Study Indications:  TIA. Risk Factors: Hypertension, hyperlipidemia. Performing Technologist: Griffin Basil RCT RDMS  Examination Guidelines: A complete evaluation includes B-mode imaging, spectral Doppler, color Doppler, and power Doppler as needed of all accessible portions of each vessel. Bilateral testing is considered an integral part of a complete examination. Limited examinations for reoccurring indications may be performed as noted.  Right Carotid Findings: +----------+--------+--------+--------+------------------+---------------------+           PSV cm/sEDV cm/sStenosisPlaque DescriptionComments              +----------+--------+--------+--------+------------------+---------------------+ CCA Prox  73      14                                                      +----------+--------+--------+--------+------------------+---------------------+ CCA Distal53      8                                 intimal thickening    +----------+--------+--------+--------+------------------+---------------------+ ICA Prox                                            limited evaluation                                                        due to extensive                                                          calcified plaque       +----------+--------+--------+--------+------------------+---------------------+ ECA       105     15                                                      +----------+--------+--------+--------+------------------+---------------------+ +----------+--------+-------+--------+-------------------+           PSV cm/sEDV cmsDescribeArm Pressure (mmHG) +----------+--------+-------+--------+-------------------+ Subclavian115                                        +----------+--------+-------+--------+-------------------+ +---------+--------+--+--------+--+---------+ VertebralPSV cm/s60EDV cm/s16Antegrade +---------+--------+--+--------+--+---------+  Left Carotid Findings: +----------+--------+--------+--------+------------------+------------------+           PSV cm/sEDV cm/sStenosisPlaque DescriptionComments           +----------+--------+--------+--------+------------------+------------------+ CCA Prox  78      10                                                   +----------+--------+--------+--------+------------------+------------------+  CCA Distal56      13                                intimal thickening +----------+--------+--------+--------+------------------+------------------+ ICA Prox  92      24      1-39%                     intimal thickening +----------+--------+--------+--------+------------------+------------------+ ICA Distal109     17                                                   +----------+--------+--------+--------+------------------+------------------+ ECA       100     6                                                    +----------+--------+--------+--------+------------------+------------------+ +----------+--------+--------+--------+-------------------+           PSV cm/sEDV cm/sDescribeArm Pressure (mmHG) +----------+--------+--------+--------+-------------------+ [id number]     2                                    +----------+--------+--------+--------+-------------------+ +---------+--------+--+--------+--+---------+ VertebralPSV cm/s43EDV cm/s12Antegrade +---------+--------+--+--------+--+---------+   Summary: Right         Non Diagnostic Right Ica evaluation due to extensive calcified Carotid:      plaque. Left Carotid: Velocities in the left ICA are consistent with a 1-39% stenosis. Vertebrals: Bilateral vertebral arteries demonstrate antegrade flow. *See table(s) above for measurements and observations.  Electronically signed by Servando Snare MD on 11/15/2019 at 7:10:45 PM.    Final     Current Facility-Administered Medications:  .  0.9 %  sodium chloride infusion, , Intravenous, Continuous, Rinehuls, Reche Dixon, Last Rate: 75 mL/hr at 11/18/19 2036, New Bag at 11/18/19 2036 .  albuterol (VENTOLIN HFA) 108 (90 Base) MCG/ACT inhaler 2 puff, 2 puff, Inhalation, Q6H PRN, Vallarie Mare, MD .  alum & mag hydroxide-simeth (MAALOX/MYLANTA) 200-200-20 MG/5ML suspension 15 mL, 15 mL, Oral, Q4H PRN, Vallarie Mare, MD, 15 mL at 11/16/19 1804 .  amLODipine (NORVASC) tablet 10 mg, 10 mg, Oral, Daily, Vallarie Mare, MD, 10 mg at 11/18/19 1035 .  aspirin EC tablet 325 mg, 325 mg, Oral, Daily, 325 mg at 11/18/19 1035 **OR** aspirin suppository 300 mg, 300 mg, Rectal, Daily, Vallarie Mare, MD, 300 mg at 11/16/19 1014 .  atorvastatin (LIPITOR) tablet 80 mg, 80 mg, Oral, q1800, Vallarie Mare, MD, 80 mg at 11/16/19 1804 .  atropine 1 MG/10ML injection, , , ,  .  chlorhexidine (PERIDEX) 0.12 % solution 15 mL, 15 mL, Mouth Rinse, BID, Vallarie Mare, MD, 15 mL at 11/18/19 2159 .  Chlorhexidine Gluconate Cloth 2 % PADS 6 each, 6 each, Topical, Daily, Vallarie Mare, MD, 6 each at 11/16/19 0800 .  clopidogrel (PLAVIX) tablet 75 mg, 75 mg, Oral, Daily, Vallarie Mare, MD, 75 mg at 11/18/19 1035 .  dexamethasone (DECADRON) injection 4 mg, 4 mg, Intravenous, Q6H, Vallarie Mare,  MD, 4 mg at 11/19/19 0529 .  docusate sodium (COLACE) capsule 100 mg,  100 mg, Oral, Daily PRN, Vallarie Mare, MD, 100 mg at 11/15/19 1257 .  enoxaparin (LOVENOX) injection 40 mg, 40 mg, Subcutaneous, Q24H, Vallarie Mare, MD, 40 mg at 11/18/19 1900 .  fentaNYL (SUBLIMAZE) 100 MCG/2ML injection, , , ,  .  furosemide (LASIX) tablet 40 mg, 40 mg, Oral, BID, Vallarie Mare, MD, 40 mg at 11/18/19 1034 .  heparin sodium (porcine) 1000 UNIT/ML injection, , , ,  .  hydrALAZINE (APRESOLINE) injection 10 mg, 10 mg, Intravenous, Q6H PRN, Vallarie Mare, MD, 10 mg at 11/17/19 7628 .  insulin aspart (novoLOG) injection 0-9 Units, 0-9 Units, Subcutaneous, Q6H, Vallarie Mare, MD, 1 Units at 11/18/19 0027 .  isosorbide mononitrate (IMDUR) 24 hr tablet 120 mg, 120 mg, Oral, Daily, Vallarie Mare, MD, 120 mg at 11/18/19 1034 .  lidocaine (XYLOCAINE) 1 % (with pres) injection, , , ,  .  MEDLINE mouth rinse, 15 mL, Mouth Rinse, q12n4p, Vallarie Mare, MD, 15 mL at 11/17/19 1505 .  metolazone (ZAROXOLYN) tablet 2.5 mg, 2.5 mg, Oral, Once per day on Mon, Thomas, Jonathan G, MD .  metoprolol tartrate (LOPRESSOR) tablet 25 mg, 25 mg, Oral, BID, Vallarie Mare, MD, 25 mg at 11/18/19 2201 .  midazolam (VERSED) 2 MG/2ML injection, , , ,  .  multivitamin with minerals tablet 1 tablet, 1 tablet, Oral, Daily, Vallarie Mare, MD, 1 tablet at 11/18/19 1035 .  nitroGLYCERIN (NITROSTAT) SL tablet 0.4 mg, 0.4 mg, Sublingual, Q5 Min x 3 PRN, Vallarie Mare, MD .  nitroGLYCERIN 100 mcg/mL intra-arterial injection, , , ,  .  ondansetron (ZOFRAN) injection 4 mg, 4 mg, Intravenous, Q6H PRN, Vallarie Mare, MD, 4 mg at 11/16/19 1307 .  pantoprazole (PROTONIX) EC tablet 40 mg, 40 mg, Oral, Daily, Vallarie Mare, MD, 40 mg at 11/18/19 1034 .  potassium chloride (KLOR-CON) CR tablet 10 mEq, 10 mEq, Oral, BID, Vallarie Mare, MD, 10 mEq at 11/18/19 2200 .  verapamil (ISOPTIN) 2.5 MG/ML  injection, , , ,    Physical Exam: Unable to assess today. Patient was not in the room. Per nursing staff , no new neuro changes.   Assessment & Plan: 74 year old gentleman with PMHx of COPD, CHF, HTN, MI, OSA, and known right medial sphenoid wing meningioma who presented to [facility]  on 9/22 with left-sided weakness and left facial droop, work-up is revealed multiple acute infarcts right MCA distribution.  April, 2021-CT scan revealed a right sphenoid wing meningioma on. He was supposed to follow up with neurosurgery outpatient on 9/22 to go over results of his MRI, however he had acute onset of left sided weakness and facial droop.  1. Meningioma, large -Large right cavernous area extending into prepontine cistern and sella w/upward displacement of R MCA -Currently not a surgical candidate given acute stroke -Vasogenic edema on Decadron IV 4mg  q6, put on CBG and insulin scale  2. Stroke:Carotid stenosis: Right MCA and ACA infarcts and right MCA narrowing due to large R meningioma.  -F/U with neurology vascular consults -CTA head 11/16/19- Bulky eccentric plaque at the right bifurcation/proximal right ICA with associated stenosis of up to 65-70% by NASCET criteria. Mild atheromatous change about the left carotid bifurcation/proximal left ICA with no more than mild 10-20% stenosis by NASCET criteria. -CT - 11/16/19 -  Acute infarct right MCA territory in the right frontal parietal lobe extending into the occipital lobe is unchanged. 9 mm midline shift to the left unchanged. -TEE  revealed: mitral annular calcification with some nobular thickening of mitral leaflets, Dr. Johnsie Cancel doesn't think TEE is needed at this point. -DAPT: aspirin 325 mg daily and plavix 75 daily -consulted neuro IR for carotid stenting, cerebral angiogram today -DVT ppx: Enoxaparin 40 mg q24 hr, SCDs  3.Hypertension -BP controlled at this point, high 140s, low 150s -On amlodipine 10, metoprolol 25 bid,  furosemide 40 bid and isosorbide mononitrate 120 mg daily -Okay if blood pressure <180/105, hydralazine 10 PRN if BP <180/105 -Long term BP goal 130s-150s  4. AKI on chronic CKD? -Cr level today 1.44. GFR: 54, however back in April 2021: Cr: 1.56, 2011: Cr: 1.54-1.76.  -Increase PO intake -continue 75cc/hr continuous -BMP daily -I&O matched for the past 24 hours    Osie Cheeks 11/19/19 10:40 AM

## 2019-11-19 NOTE — Progress Notes (Signed)
Chief Complaint: Patient was seen in consultation today for cerebral arteriogram   Referring Physician(s): Dr. Leonie Man  Supervising Physician: Luanne Bras  Patient Status: [facility] - In-pt  History of Present Illness: Charles Sherman is a 74 y.o. male with hc of meningioma. Presented to [facility] [place] with left facial and extremity weakness Noted to have (R)MCA and ACA infarcts as well as high grade ICA stenosis. NIR is asked to perform formal cerebral arteriogram. PMHx, meds, labs, imaging, allergies reviewed. Has been NPO today as directed.    Past Medical History:  Diagnosis Date  . CHF (congestive heart failure) ([place])   . COPD (chronic obstructive pulmonary disease) ([place])   . HTN (hypertension)   . Myocardial infarct ([place])   . OSA (obstructive sleep apnea)    on CPAP    Past Surgical History:  Procedure Laterality Date  . PERCUTANEOUS CORONARY STENT INTERVENTION (PCI-S)      Allergies: Shrimp [shellfish allergy]  Medications:  Current Facility-Administered Medications:  .  0.9 %  sodium chloride infusion, , Intravenous, Continuous, [place]uls, Reche Dixon, Last Rate: 75 mL/hr at 11/18/19 2036, New Bag at 11/18/19 2036 .  albuterol (VENTOLIN HFA) 108 (90 Base) MCG/ACT inhaler 2 puff, 2 puff, Inhalation, Q6H PRN, Vallarie Mare, MD .  alum & mag hydroxide-simeth (MAALOX/MYLANTA) 200-200-20 MG/5ML suspension 15 mL, 15 mL, Oral, Q4H PRN, Vallarie Mare, MD, 15 mL at 11/16/19 1804 .  amLODipine (NORVASC) tablet 10 mg, 10 mg, Oral, Daily, Vallarie Mare, MD, 10 mg at 11/18/19 1035 .  aspirin EC tablet 325 mg, 325 mg, Oral, Daily, 325 mg at 11/18/19 1035 **OR** aspirin suppository 300 mg, 300 mg, Rectal, Daily, Vallarie Mare, MD, 300 mg at 11/16/19 1014 .  atorvastatin (LIPITOR) tablet 80 mg, 80 mg, Oral, q1800, Vallarie Mare, MD, 80 mg at 11/16/19 1804 .  chlorhexidine (PERIDEX) 0.12 % solution 15 mL, 15 mL, Mouth Rinse, BID, Vallarie Mare, MD, 15 mL at 11/18/19 2159 .  Chlorhexidine Gluconate Cloth 2 % PADS 6 each, 6 each, Topical, Daily, Vallarie Mare, MD, 6 each at 11/16/19 0800 .  clopidogrel (PLAVIX) tablet 75 mg, 75 mg, Oral, Daily, Vallarie Mare, MD, 75 mg at 11/18/19 1035 .  dexamethasone (DECADRON) injection 4 mg, 4 mg, Intravenous, Q6H, Vallarie Mare, MD, 4 mg at 11/19/19 0529 .  docusate sodium (COLACE) capsule 100 mg, 100 mg, Oral, Daily PRN, Vallarie Mare, MD, 100 mg at 11/15/19 1257 .  enoxaparin (LOVENOX) injection 40 mg, 40 mg, Subcutaneous, Q24H, Vallarie Mare, MD, 40 mg at 11/18/19 1900 .  furosemide (LASIX) tablet 40 mg, 40 mg, Oral, BID, Vallarie Mare, MD, 40 mg at 11/18/19 1034 .  hydrALAZINE (APRESOLINE) injection 10 mg, 10 mg, Intravenous, Q6H PRN, Vallarie Mare, MD, 10 mg at 11/17/19 [phone] .  insulin aspart (novoLOG) injection 0-9 Units, 0-9 Units, Subcutaneous, Q6H, Vallarie Mare, MD, 1 Units at 11/18/19 0027 .  isosorbide mononitrate (IMDUR) 24 hr tablet 120 mg, 120 mg, Oral, Daily, Vallarie Mare, MD, 120 mg at 11/18/19 1034 .  MEDLINE mouth rinse, 15 mL, Mouth Rinse, q12n4p, Vallarie Mare, MD, 15 mL at 11/17/19 1505 .  metolazone (ZAROXOLYN) tablet 2.5 mg, 2.5 mg, Oral, Once per day on Mon, Thomas, Jonathan G, MD .  metoprolol tartrate (LOPRESSOR) tablet 25 mg, 25 mg, Oral, BID, Vallarie Mare, MD, 25 mg at 11/18/19 2201 .  multivitamin with minerals tablet 1 tablet,  1 tablet, Oral, Daily, Vallarie Mare, MD, 1 tablet at 11/18/19 1035 .  nitroGLYCERIN (NITROSTAT) SL tablet 0.4 mg, 0.4 mg, Sublingual, Q5 Min x 3 PRN, Vallarie Mare, MD .  ondansetron [facility]) injection 4 mg, 4 mg, Intravenous, Q6H PRN, Vallarie Mare, MD, 4 mg at 11/16/19 1307 .  pantoprazole (PROTONIX) EC tablet 40 mg, 40 mg, Oral, Daily, Vallarie Mare, MD, 40 mg at 11/18/19 1034 .  potassium chloride (KLOR-CON) CR tablet 10 mEq, 10 mEq, Oral, BID, Vallarie Mare, MD, 10 mEq at 11/18/19 2200    Family History  Problem Relation Age of Onset  . Alzheimer's disease Mother   . Heart disease Father   . Diabetes Sister   . Stomach cancer Brother     Social History   Socioeconomic History  . Marital status: Married    Spouse name: Not on file  . Number of children: Not on file  . Years of education: Not on file  . Highest education level: Not on file  Occupational History  . Not on file  Tobacco Use  . Smoking status: Current Every Day Smoker    Packs/day: 0.50    Types: Cigarettes  . Smokeless tobacco: Never Used  Substance and Sexual Activity  . Alcohol use: Not on file  . Drug use: Not on file  . Sexual activity: Not on file  Other Topics Concern  . Not on file  Social History Narrative  . Not on file   Social Determinants of Health   Financial Resource Strain:   . Difficulty of Paying Living Expenses: Not on file  Food Insecurity:   . Worried About Charity fundraiser in the Last Year: Not on file  . Ran Out of Food in the Last Year: Not on file  Transportation Needs:   . Lack of Transportation (Medical): Not on file  . Lack of Transportation (Non-Medical): Not on file  Physical Activity:   . Days of Exercise per Week: Not on file  . Minutes of Exercise per Session: Not on file  Stress:   . Feeling of Stress : Not on file  Social Connections:   . Frequency of Communication with Friends and Family: Not on file  . Frequency of Social Gatherings with Friends and Family: Not on file  . Attends Religious Services: Not on file  . Active Member of Clubs or Organizations: Not on file  . Attends Archivist Meetings: Not on file  . Marital Status: Not on file     Review of Systems: A 12 point ROS discussed and pertinent positives are indicated in the HPI above.  All other systems are negative.  Review of Systems  Vital Signs: BP (!) 148/73 (BP Location: Left Arm)   Pulse (!) 48   Temp 97.9 F (36.6  C) (Oral)   Resp 20   SpO2 95%   Physical Exam Constitutional:      General: He is not in acute distress.    Appearance: He is obese.  HENT:     Mouth/Throat:     Mouth: Mucous membranes are moist.     Pharynx: Oropharynx is clear.  Cardiovascular:     Rate and Rhythm: Normal rate and regular rhythm.     Pulses: Normal pulses.     Heart sounds: Normal heart sounds.  Pulmonary:     Effort: Pulmonary effort is normal. No respiratory distress.     Breath sounds: Normal breath sounds.  Abdominal:  General: Abdomen is flat. There is no distension.     Tenderness: There is no abdominal tenderness.  Skin:    General: Skin is warm and dry.  Neurological:     Mental Status: He is alert and oriented to person, place, and time.  Psychiatric:        Thought Content: Thought content normal.        Judgment: Judgment normal.     Imaging: CT ANGIO HEAD W OR WO CONTRAST  Result Date: 11/14/2019 CLINICAL DATA:  Acute neuro deficit. Rule out stroke. Intracranial mass lesion. EXAM: CT ANGIOGRAPHY HEAD TECHNIQUE: Multidetector CT imaging of the head was performed using the standard protocol during bolus administration of intravenous contrast. Multiplanar CT image reconstructions and MIPs were obtained to evaluate the vascular anatomy. CONTRAST:  4mL OMNIPAQUE IOHEXOL 350 MG/ML SOLN COMPARISON:  MRI head 11/14/2019 FINDINGS: CT HEAD Brain: Large mass arising from the right cavernous sinus with peripheral calcification. This showed enhancement on MRI and is most consistent with a large meningioma. There is extension into the prepontine cistern on the right with mild mass-effect on the ventral pons on the right. There is surrounding white matter edema and mass-effect. 7 mm midline shift to the left unchanged. Tumor extends into the sella. Negative for acute infarct or hemorrhage. Patchy white matter hypodensity bilaterally most consistent with chronic microvascular ischemia as well as vasogenic  edema from the tumor on the right. Vascular: Atherosclerotic calcification in the distal vertebral and cavernous carotid arteries bilaterally. Negative for hyperdense vessel Skull: Negative Sinuses: Mild mucosal edema paranasal sinuses. Orbits: Bilateral proptosis.  No orbital mass. CTA HEAD Anterior circulation: Heavy calcification throughout the cavernous carotid bilaterally with mild stenosis bilaterally. Right cavernous sinus meningioma abuts the supraclinoid internal carotid artery without encasement or narrowing. Right middle cerebral artery is up lifted by tumor. Anterior and middle cerebral arteries patent bilaterally without stenosis or large vessel occlusion. Posterior circulation: Atherosclerotic calcification and mild stenosis distal vertebral artery bilaterally. MRI abnormality distal left vertebral artery corresponding to calcified plaque. No thrombus or filling defect. PICA patent bilaterally. Basilar is patent bilaterally. AICA patent bilaterally. The mass abuts the superior cerebellar artery on the right without occlusion. Posterior cerebral arteries patent bilaterally without occlusion or stenosis. Venous sinuses: Normal venous enhancement. Anatomic variants: None IMPRESSION: 1. Large meningioma right cavernous sinus extending into the prepontine cistern on the right. Mass enters the sella. 2. Extensive atherosclerotic calcification in the cavernous carotid bilaterally with mild stenosis. The tumor abuts the right cavernous and supraclinoid internal carotid artery without encasement or occlusion. 3. Mild calcific stenosis in the distal vertebral artery bilaterally. No thrombus identified in the left vertebral artery as question on MRI. Electronically Signed   By: Franchot Gallo M.D.   On: 11/14/2019 21:35   CT HEAD WO CONTRAST  Result Date: 11/16/2019 CLINICAL DATA:  Stroke follow-up EXAM: CT HEAD WITHOUT CONTRAST TECHNIQUE: Contiguous axial images were obtained from the base of the skull  through the vertex without intravenous contrast. COMPARISON:  CT head 11/14/2019.  MRI head 11/14/2019 FINDINGS: Brain: Hypodensity in the right frontal parietal cortex compatible with acute infarct as noted on MRI. This extends into the right occipital lobe. No associated hemorrhage. Chronic microvascular ischemic changes in the white matter. 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 This extends into the prepontine cistern with mild mass-effect on the pons on the right. There is surrounding vasogenic edema in the right temporoparietal lobe. Vascular: Negative for hyperdense vessel Skull: Negative Sinuses/Orbits: Mild mucosal  edema paranasal sinuses. Negative orbit. Other: None IMPRESSION: Acute infarct right MCA territory in the right frontal parietal lobe extending into the occipital lobe is unchanged. No acute hemorrhage Large right cavernous sinus meningioma unchanged. There is vasogenic edema in the right temporoparietal lobe. 9 mm midline shift to the left unchanged. Electronically Signed   By: Franchot Gallo M.D.   On: 11/16/2019 11:38   CT ANGIO NECK W OR WO CONTRAST  Result Date: 11/16/2019 CLINICAL DATA:  Follow-up examination for acute stroke. EXAM: CT ANGIOGRAPHY NECK TECHNIQUE: Multidetector CT imaging of the neck was performed using the standard protocol during bolus administration of intravenous contrast. Multiplanar CT image reconstructions and MIPs were obtained to evaluate the vascular anatomy. Carotid stenosis measurements (when applicable) are obtained utilizing NASCET criteria, using the distal internal carotid diameter as the denominator. CONTRAST:  170mL OMNIPAQUE IOHEXOL 350 MG/ML SOLN COMPARISON:  Prior CTA from 11/14/2019. FINDINGS: Aortic arch: Examination technically limited by positioning. Visualized aortic arch normal in caliber with normal branch pattern. Mild-to-moderate atheromatous change about the arch and origin of the great vessels. No hemodynamically  significant stenosis. Right carotid system: Right common carotid artery patent from its origin to the bifurcation without stenosis. Bulky eccentric plaque at the right bifurcation/proximal right ICA with associated stenosis of up to 65-70% by NASCET criteria. Right ICA tortuous but otherwise patent to the skull base without stenosis, dissection or occlusion. Left carotid system: Left CCA patent from its origin to the bifurcation without stenosis. Scattered calcified plaque about the left bifurcation/proximal left ICA with no more than mild 10-20% stenosis by NASCET criteria. Left ICA tortuous but otherwise widely patent to the skull base without stenosis, dissection or occlusion. Vertebral arteries: Both vertebral arteries arise from the subclavian arteries. No proximal subclavian artery stenosis. Mild scattered plaque about the origins and proximal vertebral arteries without hemodynamically significant stenosis. Vertebral arteries patent within the neck without significant stenosis, dissection, or occlusion. Skeleton: No visible acute osseous abnormality, although evaluation limited by positioning. No discrete or worrisome osseous lesions. Mild multilevel cervical spondylosis, most pronounced at C5-6. Other neck: No other acute soft tissue abnormality within the neck. No visible mass or adenopathy. Upper chest: Scattered atelectatic changes noted within the visualized lungs. A degree of pulmonary interstitial congestion may be present as well. No other definite acute abnormality. IMPRESSION: 1. Bulky eccentric plaque at the right bifurcation/proximal right ICA with associated stenosis of up to 65-70% by NASCET criteria. 2. Mild atheromatous change about the left carotid bifurcation/proximal left ICA with no more than mild 10-20% stenosis by NASCET criteria. 3. Wide patency of both vertebral arteries within the neck. 4. Diffuse tortuosity of the major arterial vasculature of the neck, suggesting chronic underlying  hypertension. Electronically Signed   By: Jeannine Boga M.D.   On: 11/16/2019 02:05   ECHOCARDIOGRAM COMPLETE  Result Date: 11/16/2019    ECHOCARDIOGRAM REPORT   Patient Name:   Charles Sherman [place] Date of Exam: 11/16/2019 Medical Rec #:  [id number]             Height:       71.0 in Accession #:    [id number]            Weight:       262.4 lb Date of Birth:  07/20/1945              BSA:          2.367 m Patient Age:    32 years  BP:           191/98 mmHg Patient Gender: M                     HR:           52 bpm. Exam Location:  Inpatient Procedure: 2D Echo, Cardiac Doppler and Color Doppler Indications:    CVA  History:        Patient has prior history of Echocardiogram examinations, most                 recent 03/28/2019. CHF, Previous Myocardial Infarction, COPD; Risk                 Factors:Current Smoker and Hypertension.  Sonographer:    Dustin Flock Referring Phys: [id number] [place]  1. There is a small calcified mass attached to the anterior MV leaflet. It is moves with the leaflet. It measures 0.4 cm x 0.7 cm. It is best seen in the PLAX view. 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 There is no significant regurgitation or destruction of the valve to suggest endocarditis. 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 Would recommend a TEE for better characterization in the setting of recent stroke. The mitral valve is degenerative. Trivial mitral valve regurgitation. No evidence of mitral stenosis.  2. Left ventricular ejection fraction, by estimation, is 60 to 65%. The left ventricle has normal function. The left ventricle has no regional wall motion abnormalities. There is mild concentric left ventricular hypertrophy. Left ventricular diastolic parameters are consistent with Grade I diastolic dysfunction (impaired relaxation).  3. Right ventricular systolic function is normal. The right ventricular size is normal. Tricuspid  regurgitation signal is inadequate for assessing PA pressure.  4. Left atrial size was mildly dilated.  5. The aortic valve is tricuspid. There is mild calcification of the aortic valve. There is mild thickening of the aortic valve. Aortic valve regurgitation is not visualized. Mild aortic valve sclerosis is present, with no evidence of aortic valve stenosis.  6. The inferior vena cava is normal in size with greater than 50% respiratory variability, suggesting right atrial pressure of 3 mmHg. Conclusion(s)/Recommendation(s): Findings concerning for MV mass, would recommend Transesophageal Echocardiogram for clarification. FINDINGS  Left Ventricle: Left ventricular ejection fraction, by estimation, is 60 to 65%. The left ventricle has normal function. The left ventricle has no regional wall motion abnormalities. The left ventricular internal cavity size was normal in size. There is  mild concentric left ventricular hypertrophy. Left ventricular diastolic parameters are consistent with Grade I diastolic dysfunction (impaired relaxation). Normal left ventricular filling pressure. Right Ventricle: The right ventricular size is normal. No increase in right ventricular wall thickness. Right ventricular systolic function is normal. Tricuspid regurgitation signal is inadequate for assessing PA pressure. Left Atrium: Left atrial size was mildly dilated. Right Atrium: Right atrial size was normal in size. Pericardium: Trivial pericardial effusion is present. Mitral Valve: There is a small calcified mass attached to the anterior MV leaflet. It is moves with the leaflet. It measures 0.4 cm x 0.7 cm. It is best seen in the PLAX view. 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 There is no significant regurgitation or destruction of the valve to suggest endocarditis. 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 Would recommend a TEE for better characterization in the setting of recent stroke.  The mitral valve is degenerative in appearance. There is mild calcification of the  anterior and posterior mitral valve leaflet(s). Trivial mitral valve regurgitation. No evidence of mitral valve stenosis. Tricuspid Valve: The tricuspid valve is grossly normal. Tricuspid valve regurgitation is trivial. No evidence of tricuspid stenosis. Aortic Valve: The aortic valve is tricuspid. There is mild calcification of the aortic valve. There is mild thickening of the aortic valve. Aortic valve regurgitation is not visualized. Mild aortic valve sclerosis is present, with no evidence of aortic valve stenosis. Pulmonic Valve: The pulmonic valve was grossly normal. Pulmonic valve regurgitation is not visualized. No evidence of pulmonic stenosis. Aorta: The aortic root is normal in size and structure. Venous: The inferior vena cava is normal in size with greater than 50% respiratory variability, suggesting right atrial pressure of 3 mmHg. IAS/Shunts: The atrial septum is grossly normal.  LEFT VENTRICLE PLAX 2D LVIDd:         4.50 cm  Diastology LVIDs:         3.10 cm  LV e' medial:    5.87 cm/s LV PW:         1.20 cm  LV E/e' medial:  10.9 LV IVS:        1.20 cm  LV e' lateral:   7.72 cm/s LVOT diam:     2.50 cm  LV E/e' lateral: 8.3 LV SV:         135 LV SV Index:   57 LVOT Area:     4.91 cm  RIGHT VENTRICLE RV Basal diam:  3.40 cm RV S prime:     18.00 cm/s TAPSE (M-mode): 3.8 cm LEFT ATRIUM           Index       RIGHT ATRIUM           Index LA diam:      4.90 cm 2.07 cm/m  RA Area:     21.30 cm LA Vol (A2C): 61.8 ml 26.11 ml/m RA Volume:   61.30 ml  25.90 ml/m LA Vol (A4C): 96.9 ml 40.95 ml/m  AORTIC VALVE LVOT Vmax:   110.00 cm/s LVOT Vmean:  72.600 cm/s LVOT VTI:    0.275 m  AORTA Ao Root diam: 3.40 cm MITRAL VALVE MV Area (PHT): 3.72 cm    SHUNTS MV Decel Time: 204 msec    Systemic VTI:  0.28 m MV E velocity: 63.80 cm/s  Systemic Diam: 2.50 cm MV A velocity: 55.30 cm/s MV E/A ratio:  1.15 Eleonore Chiquito MD  Electronically signed by Eleonore Chiquito MD Signature Date/Time: 11/16/2019/11:52:00 AM    Final    VAS US CAROTID  Result Date: 11/15/2019 Carotid Arterial Duplex Study Indications:  TIA. Risk Factors: Hypertension, hyperlipidemia. Performing Technologist: Griffin Basil RCT RDMS  Examination Guidelines: A complete evaluation includes B-mode imaging, spectral Doppler, color Doppler, and power Doppler as needed of all accessible portions of each vessel. Bilateral testing is considered an integral part of a complete examination. Limited examinations for reoccurring indications may be performed as noted.  Right Carotid Findings: +----------+--------+--------+--------+------------------+---------------------+           PSV cm/sEDV cm/sStenosisPlaque DescriptionComments              +----------+--------+--------+--------+------------------+---------------------+ CCA Prox  73      14                                                      +----------+--------+--------+--------+------------------+---------------------+  CCA Distal53      8                                 intimal thickening    +----------+--------+--------+--------+------------------+---------------------+ ICA Prox                                            limited evaluation                                                        due to extensive                                                          calcified plaque      +----------+--------+--------+--------+------------------+---------------------+ ECA       105     15                                                      +----------+--------+--------+--------+------------------+---------------------+ +----------+--------+-------+--------+-------------------+           PSV cm/sEDV cmsDescribeArm Pressure (mmHG) +----------+--------+-------+--------+-------------------+ Subclavian115                                         +----------+--------+-------+--------+-------------------+ +---------+--------+--+--------+--+---------+ VertebralPSV cm/s60EDV cm/s16Antegrade +---------+--------+--+--------+--+---------+  Left Carotid Findings: +----------+--------+--------+--------+------------------+------------------+           PSV cm/sEDV cm/sStenosisPlaque DescriptionComments           +----------+--------+--------+--------+------------------+------------------+ CCA Prox  78      10                                                   +----------+--------+--------+--------+------------------+------------------+ CCA Distal56      13                                intimal thickening +----------+--------+--------+--------+------------------+------------------+ ICA Prox  92      24      1-39%                     intimal thickening +----------+--------+--------+--------+------------------+------------------+ ICA Distal109     17                                                   +----------+--------+--------+--------+------------------+------------------+ ECA       100     6                                                    +----------+--------+--------+--------+------------------+------------------+ +----------+--------+--------+--------+-------------------+  PSV cm/sEDV cm/sDescribeArm Pressure (mmHG) +----------+--------+--------+--------+-------------------+ [id number]     2                                   +----------+--------+--------+--------+-------------------+ +---------+--------+--+--------+--+---------+ VertebralPSV cm/s43EDV cm/s12Antegrade +---------+--------+--+--------+--+---------+   Summary: Right         Non Diagnostic Right Ica evaluation due to extensive calcified Carotid:      plaque. Left Carotid: Velocities in the left ICA are consistent with a 1-39% stenosis. Vertebrals: Bilateral vertebral arteries demonstrate antegrade flow. *See table(s)  above for measurements and observations.  Electronically signed by Servando Snare MD on 11/15/2019 at 7:10:45 PM.    Final     Labs:  CBC: Recent Labs    11/15/19 1143 11/17/19 0504 11/18/19 0512 11/19/19 0550  WBC 15.1* 13.4* 11.8* 9.7  HGB 15.9 15.7 15.2 15.0  HCT 47.6 46.8 45.7 45.2  PLT 185 182 175 169    COAGS: No results for input(s): INR, APTT in the last 8760 hours.  BMP: Recent Labs    11/15/19 1143 11/17/19 0504 11/18/19 0512 11/19/19 0550  NA 142 141 139 140  K 4.7 4.4 4.5 4.7  CL 107 105 105 109  CO2 24 26 25 24   GLUCOSE 121* 110* 115* 118*  BUN 18 25* 32* 35*  CALCIUM 9.9 9.5 9.3 9.0  CREATININE 1.19 1.25* 1.43* 1.44*  GFRNONAA 59* 56* 47* 47*  GFRAA >60 >60 55* 54*    LIVER FUNCTION TESTS: No results for input(s): BILITOT, AST, ALT, ALKPHOS, PROT, ALBUMIN in the last 8760 hours.  TUMOR MARKERS: No results for input(s): AFPTM, CEA, CA199, CHROMGRNA in the last 8760 hours.  Assessment and Plan: CVA Meningioma For formal cerebral diagnostic arteriogram. Labs reviewed. Risks and benefits of cerebral angio were discussed with the patient including, but not limited to bleeding, infection, vascular injury or contrast induced renal failure.  This interventional procedure involves the use of X-rays and because of the nature of the planned procedure, it is possible that we will have prolonged use of X-ray fluoroscopy.  Potential radiation risks to you include (but are not limited to) the following: - A slightly elevated risk for cancer  several years later in life. This risk is typically less than 0.5% percent. This risk is low in comparison to the normal incidence of human cancer, which is 33% for women and 50% for men according to the [employer]. - Radiation induced injury can include skin redness, resembling a rash, tissue breakdown / ulcers and hair loss (which can be temporary or permanent).   The likelihood of either of these occurring  depends on the difficulty of the procedure and whether you are sensitive to radiation due to previous procedures, disease, or genetic conditions.   IF your procedure requires a prolonged use of radiation, you will be notified and given written instructions for further action.  It is your responsibility to monitor the irradiated area for the 2 weeks following the procedure and to notify your physician if you are concerned that you have suffered a radiation induced injury.    All of the patient's questions were answered, patient is agreeable to proceed.  Consent signed and in chart.   Thank you for this interesting consult.  I greatly enjoyed meeting Charles Sherman and look forward to participating in their care.  A copy of this report was sent to the requesting provider on this date.  Electronically  Signed: Ascencion Dike, PA-C 11/19/2019, 8:55 AM   I spent a total of 20 minutes in face to face in clinical consultation, greater than 50% of which was counseling/coordinating care for cerebral arteriogram

## 2019-11-20 LAB — GLUCOSE, CAPILLARY
Glucose-Capillary: 119 mg/dL — ABNORMAL HIGH (ref 70–99)
Glucose-Capillary: 134 mg/dL — ABNORMAL HIGH (ref 70–99)
Glucose-Capillary: 134 mg/dL — ABNORMAL HIGH (ref 70–99)
Glucose-Capillary: 144 mg/dL — ABNORMAL HIGH (ref 70–99)
Glucose-Capillary: 149 mg/dL — ABNORMAL HIGH (ref 70–99)

## 2019-11-20 LAB — BASIC METABOLIC PANEL
Anion gap: 6 (ref 5–15)
BUN: 34 mg/dL — ABNORMAL HIGH (ref 8–23)
CO2: 26 mmol/L (ref 22–32)
Calcium: 9.1 mg/dL (ref 8.9–10.3)
Chloride: 107 mmol/L (ref 98–111)
Creatinine, Ser: 1.35 mg/dL — ABNORMAL HIGH (ref 0.61–1.24)
GFR calc Af Amer: 59 mL/min — ABNORMAL LOW (ref >60)
GFR calc non Af Amer: 51 mL/min — ABNORMAL LOW (ref >60)
Glucose, Bld: 119 mg/dL — ABNORMAL HIGH (ref 70–99)
Potassium: 4.6 mmol/L (ref 3.5–5.1)
Sodium: 139 mmol/L (ref 135–145)

## 2019-11-20 LAB — PLATELET INHIBITION P2Y12: Platelet Function  P2Y12: 139 [PRU] — ABNORMAL LOW (ref 182–335)

## 2019-11-20 LAB — CBC
HCT: 45.3 % (ref 39.0–52.0)
Hemoglobin: 14.9 g/dL (ref 13.0–17.0)
MCH: 31.5 pg (ref 26.0–34.0)
MCHC: 32.9 g/dL (ref 30.0–36.0)
MCV: 95.8 fL (ref 80.0–100.0)
Platelets: 177 10*3/uL (ref 150–400)
RBC: 4.73 MIL/uL (ref 4.22–5.81)
RDW: 13.8 % (ref 11.5–15.5)
WBC: 10.9 10*3/uL — ABNORMAL HIGH (ref 4.0–10.5)
nRBC: 0 % (ref 0.0–0.2)

## 2019-11-20 LAB — SARS CORONAVIRUS 2 BY RT PCR (HOSPITAL ORDER, PERFORMED IN ~~LOC~~ HOSPITAL LAB): SARS Coronavirus 2: NEGATIVE

## 2019-11-20 MED ORDER — METOPROLOL TARTRATE 12.5 MG HALF TABLET
12.5000 mg | ORAL_TABLET | Freq: Two times a day (BID) | ORAL | Status: DC
  Administered 2019-11-23 – 2019-11-26 (×7): 12.5 mg via ORAL
  Filled 2019-11-20 (×11): qty 1

## 2019-11-20 MED ORDER — CLOPIDOGREL BISULFATE 75 MG PO TABS
300.0000 mg | ORAL_TABLET | Freq: Once | ORAL | Status: AC
  Administered 2019-11-20: 300 mg via ORAL
  Filled 2019-11-20: qty 4

## 2019-11-20 NOTE — Progress Notes (Signed)
STROKE TEAM PROGRESS NOTE   INTERVAL HISTORY Patient is sitting up comfortably he has no complaints today.  Neuro exam is unchanged.  Vital signs stable. . Cerebral catheter angiogram findings reviewed with Dr. Ellene Route who agrees with elective right carotid angioplasty stenting to be done tomorrow by Dr. Estanislado Pandy. Vitals:   11/20/19 0500 11/20/19 0752 11/20/19 0925 11/20/19 1000  BP:  (!) 155/76 137/62 137/62  Pulse: 60 61  (!) 49  Resp: 17 14    Temp:  98.5 F (36.9 C)    TempSrc:  Oral    SpO2: 98% 99%     CBC:  Recent Labs  Lab 11/19/19 0550 11/20/19 1345  WBC 9.7 10.9*  HGB 15.0 14.9  HCT 45.2 45.3  MCV 97.6 95.8  PLT 169 622   Basic Metabolic Panel:  Recent Labs  Lab 11/19/19 0550 11/20/19 1345  NA 140 139  K 4.7 4.6  CL 109 107  CO2 24 26  GLUCOSE 118* 119*  BUN 35* 34*  CREATININE 1.44* 1.35*  CALCIUM 9.0 9.1   Lipid Panel:  Recent Labs  Lab 11/15/19 1143  CHOL 189  TRIG 54  HDL 64  CHOLHDL 3.0  VLDL 11  LDLCALC 114*   HgbA1c:  Recent Labs  Lab 11/15/19 1143  HGBA1C 5.4   Urine Drug Screen: No results for input(s): LABOPIA, COCAINSCRNUR, LABBENZ, AMPHETMU, THCU, LABBARB in the last 168 hours.  Alcohol Level No results for input(s): ETH in the last 168 hours.  IMAGING past 24 hours No results found.  PHYSICAL EXAM  Temp:  [98.2 F (36.8 C)-98.7 F (37.1 C)] 98.5 F (36.9 C) (09/28 0752) Pulse Rate:  [43-61] 49 (09/28 1000) Resp:  [13-20] 14 (09/28 0752) BP: (137-155)/(61-76) 137/62 (09/28 1000) SpO2:  [98 %-99 %] 99 % (09/28 0752)  General -obese elderly African-American male not in acute distress  Ophthalmologic - fundi not visualized due to noncooperation.  Cardiovascular - Regular rhythm and rate.  No murmur or gallop  Neuro - awake alert, eyes open, follow up simple commands. Oriented to age, time and place.  No dysarthria no aphasia, able to name and repeat simple sentences. Right gaze preference, able to cross midline, left  gaze paresis. Eyes conjugate, but limited upper and lower gaze. 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 Left able to count fingers, intact pupillary reflex. Left facial droop. Tongue midline. RUE 5/5, RLE 3+/5.   LUE 2/5 proximal  0/5 finger movement with increased muscle tone. LLE 3-/5 proximal and 3/5 distal DF. Sensation symmetrical per pt, right FTN intact. Gait not tested.   ASSESSMENT/PLAN Charles Sherman is a 74 y.o. male with history of COPD, CHF, HTN, MI, OSA and known meningioma (from 05/2019 with R CRAO, followed by NS w/ scheduled appt for 9/23) presenting to [facility] 9/22 with L facial droop, L sided weakness and dysarthria.   Stroke: Scattered R MCA and ACA infarcts in setting of right ICA bulb and siphon stenosis and right MCA narrowing due to large R meningioma. Can not rule out embolic source.  MRI  [facility]) 9/22 R MCA and ACA scattered infarcts. Elevation of R MCA d/t mass effect. R middle cranial fossa meningioma involving R cavernous sinus and sella turcica unchanged w/ 36mm L midline shift.  Vasogenic edema on the right brain.  CTA head large R cavernous sinus meningioma extending into prepontine cistern on the R, mass enters the sella. Extensive atherosclerotic calcification B cavernous ICA  and supraclinoid ICA. Distal B VA mild stenoses. Right middle cerebral artery is up lifted by tumor.  CT - 11/16/19 -  Acute infarct right MCA territory in the right frontal parietal lobe extending into the occipital lobe is unchanged. 9 mm midline shift to the left unchanged.  Carotid Doppler nondiagnostic of right ICA due to extensive plaque  CTA neck Bulky eccentric plaque at the right bifurcation/proximal right ICA with associated stenosis of up to 65-70%  2D Echo - EF 60 - 65%. Findings concerning for MV mass - TEE recommended  for clarification. Will arrange for Monday. (order placed - message sent to cardiology - pt will be NPO  Monday)  LDL 114  HgbA1c 5.4  VTE prophylaxis - lovenox   aspirin 81 mg daily prior to admission, now on aspirin 325 mg daily and plavix DAPT for 3 months and then plavix alone.   Therapy recommendations:  CIR recommended  Disposition:  pending   Meningioma, large  Large R cavernous area extending into prepontine cistern and sella w/ upward displacement of R MCA  Previously not a surgical candidate given R CRAO at time of dx  Currently not a surgical candidate given acute stroke  NS following for possible future removal  Vasogenic edema on Decadron IV  Carotid stenosis Hx of R CRAO  05/2019 right eye vision loss  02/2019 CUS Bilateral carotid atherosclerosis. No hemodynamically significant ICA stenosis.   This admission CUS nondiagnostic of right ICA due to extensive plaque  CTA neck Bulky eccentric plaque at the right bifurcation/proximal right ICA with associated stenosis of up to 65-70%  May consider right CEA in the near future. I have discussed with Dr. Estanislado Pandy and he will see pt   Vasogenic edema  CT and MRI showed right brain vasogenic edema due to tumor  On decadron IV  Put on CBG and SSI  Vomiting 9/23   CT - 11/16/19 - Large right cavernous sinus meningioma unchanged. There is vasogenic edema in the right temporoparietal lobe. 9 mm midline shift to the left unchanged.  Hypertension  Home meds:  HCTZ 25 tid, imdur 120, amlodipine 10, lasix 80 bid, metoprolol 25 bid  On amlodipine 10, metoprolol 25 bid, lasix 40 bid and imdur 120 daily  On hydralazine PRN  BP stable  BP goal < 180/105 - gradually decrease BP to the long term goal . Long-term BP goal 130-150 given ICA siphon and bulb stenosis  Hyperlipidemia  Home meds:  lipitor 40, resumed in hospital  LDL 114, goal < 70  Increase lipitor to 80  Continue statin at discharge  Tobacco abuse  Current smoker  Smoking cessation counseling provided  Pt is willing to quit     Other  Stroke Risk Factors  Advanced age  Obesity, recommend weight loss, diet and exercise as appropriate   Coronary artery disease, hx MI w/ PCI x 3  Obstructive sleep apnea, on CPAP at home  Chronic diastolic Congestive heart failure  Other Active Problems  Leukocytosis WBC 8.3->15.1->13.4->11.8  COPD  Bradycardia (40's) (on Lopressor 25 mg Bid home meds)   AKI - creatinine - 1.25 - encourage po intake - on IVF fluids 50 cc / hr ->1.43 -> will increase IV to 75 cc /hr (pt is on Zaroxolyn and Lasix - CHF hx - may need to decrease dose if creatinine continues to climb)  Hospital day # 6 Plan patient will benefit from elective right carotid angioplasty stenting to reduce risk for future strokes.  Discussed  with Dr. Estanislado Pandy and patient and plan to do this tomorrow morning.  Discussed with Dr. Ellene Route. Continue aspirin and Plavix.  Continue Decadron and management of meningioma as per neurosurgery.    Greater than 50% time during this 25-minute visit was spent on counseling and coordination of care and about his stroke and meningioma and discussion with care team and answering questions Antony Contras, MD   To contact Stroke Continuity provider, please refer to [email or web address] After hours, contact General Neurology

## 2019-11-20 NOTE — Progress Notes (Signed)
P2y12 noted to be 139 - review with Dr. Estanislado Pandy who recommends the following:  - Plavix 300 mg PO x 1 now - Plavix 75 mg PO @ 0700 tomorrow prior to procedure (may resume previous dose schedule on 9/30) - ASA 81 mg PO/PR @ 0700 tomorrow prior to procedure (may resume previous dose schedule on 9/30.  Orders have been placed for the above.  Additionally, procedure was discussed with patient's sister Claudine Eisenberg via phone today who states she is medical POA and will relay information to his wife. All questions answered, she is also agreeable to proceed as planned.  Plan to proceed with NIR intervention at 0830 on 9/29 under general anesthesia. Consult/consent completed and is in chart.  Candiss Norse, PA-C

## 2019-11-20 NOTE — Progress Notes (Signed)
Occupational Therapy Treatment Patient Details Name: Charles Sherman MRN: [id number] DOB: January 29, 1946 Today's Date: 11/20/2019    History of present illness 74 year old gentleman with known right medial sphenoid wing meningioma scheduled for routine outpatient follow-up after an MRI scan on the 15th.  However patient presented to the outside hospital emergency room with left-sided weakness last night work-up is revealed multiple acute infarcts right MCA distribution. PMH includes CHF, COPD, HTN, MI, OSA.   OT comments  Pt progressing towards OT goals, pleasant and very willing to work with therapies this AM. Pt continues to require two person assist for safe completion of bed mobility and functional transfers including stand pivot to recliner - pt with strong L lateral lean in standing and with difficulty sequencing/wt shifting with steps to recliner. Pt requiring up to max multimodal cues to locate items in L visual field and to attend to L side of body. Noted increased tone in LUE this date with no active movements. Pt with bradycardia throughout (HR 47-53 bpm, RN awares). Feel pt remains an excellent candidate for CIR level therapies at time of discharge to progress him to towards his PLOF. Will continue to follow acutely.   Follow Up Recommendations  CIR    Equipment Recommendations  Other (comment);3 in 1 bedside commode;Wheelchair (measurements OT);Wheelchair cushion (measurements OT) (TBD)          Precautions / Restrictions Precautions Precautions: Fall Precaution Comments: L sided neglect Restrictions Weight Bearing Restrictions: No       Mobility Bed Mobility Overal bed mobility: Needs Assistance Bed Mobility: Rolling;Sidelying to Sit Rolling: Max assist;+2 for physical assistance Sidelying to sit: Max assist;+2 for physical assistance       General bed mobility comments: max directional verbal cues, verbal cues to turn head to the left to find bedrail to reach for  with R UE, pt able to assist minimally with  RUE to pull over into L sidelying, maxAX2 for trunk elevation to sit eob, pt without functional capability with L UE to assist with transfer  Transfers Overall transfer level: Needs assistance Equipment used: 2 person hand held assist (lift with gait belt) Transfers: Sit to/from Stand;Stand Pivot Transfers Sit to Stand: Max assist;+2 physical assistance Stand pivot transfers: Max assist;+2 physical assistance       General transfer comment: pt with L lateral lean, L knee in hyperextension, pt requiring verbal cues for to achieve midline posture, max tactile and verbal cues, to sequence stepping to chair in addition to maxA for advancement to R LE to chair, total assist for L LE management (sliding across floor)    Balance Overall balance assessment: Needs assistance Sitting-balance support: Single extremity supported;Bilateral upper extremity supported;Feet supported Sitting balance-Leahy Scale: Poor Sitting balance - Comments: pt minA to maintain EOB balance, pt wiht strong L lateral lean however with max tactile and verbal cues pt able to correct to midline but unable to maitnain midline position Postural control: Left lateral lean;Posterior lean Standing balance support: Bilateral upper extremity supported Standing balance-Leahy Scale: Zero Standing balance comment: maxA x2, left lateral lean                           ADL either performed or assessed with clinical judgement   ADL Overall ADL's : Needs assistance/impaired     Grooming: Min guard;Sitting Grooming Details (indicate cue type and reason): close guarding to minA for sitting balance; cues for thoroughness when washing L side of face  Functional mobility during ADLs: Maximal assistance;+2 for physical assistance;+2 for safety/equipment (stand pivot ) General ADL Comments: pt remains with impaired cognition, L side inattention,  decreased sitting/standing balance      Vision       Perception     Praxis      Cognition Arousal/Alertness: Awake/alert Behavior During Therapy: Flat affect Overall Cognitive Status: Impaired/Different from baseline Area of Impairment: Attention;Memory;Following commands;Safety/judgement;Awareness;Problem solving                   Current Attention Level: Sustained Memory: Decreased recall of precautions;Decreased short-term memory Following Commands: Follows one step commands with increased time;Follows one step commands inconsistently Safety/Judgement: Decreased awareness of safety;Decreased awareness of deficits (L sided neglect) Awareness: Intellectual Problem Solving: Slow processing;Decreased initiation;Difficulty sequencing;Requires verbal cues;Requires tactile cues General Comments: pt following simple commands majority of time, very delayed processing requiring max tactile and verbal cues to complete task, especially with L LE and UE        Exercises Exercises: General Lower Extremity General Exercises - Lower Extremity Long Arc Quad: AROM;Left;10 reps;Seated   Shoulder Instructions       General Comments Pt on RA at 93%, HR 47-53 bpm, RN aware    Pertinent Vitals/ Pain       Pain Assessment: Faces Faces Pain Scale: No hurt Pain Intervention(s): Monitored during session  Home Living                                          Prior Functioning/Environment              Frequency  Min 2X/week        Progress Toward Goals  OT Goals(current goals can now be found in the care plan section)  Progress towards OT goals: Progressing toward goals  Acute Rehab OT Goals Patient Stated Goal: didn't state OT Goal Formulation: With patient Time For Goal Achievement: 11/30/19 Potential to Achieve Goals: Good ADL Goals Pt Will Perform Grooming: with min assist (supported ) Pt Will Transfer to Toilet: with min assist;stand pivot  transfer;bedside commode Pt Will Perform Toileting - Clothing Manipulation and hygiene:  (will be minA for standing balance to A with tasks ) Additional ADL Goal #1: Pt will be S to sit EOB for basic ADLs and in prep for transfers Additional ADL Goal #2: Pt will be able to locate 3/5 objects on tray in front of him Additional ADL Goal #3: Pt will be Min A to roll to the left to A with basic ADLs  Plan Discharge plan remains appropriate    Co-evaluation    PT/OT/SLP Co-Evaluation/Treatment: Yes Reason for Co-Treatment: Complexity of the patient's impairments (multi-system involvement) PT goals addressed during session: Mobility/safety with mobility OT goals addressed during session: ADL's and self-care;Strengthening/ROM      AM-PAC OT "6 Clicks" Daily Activity     Outcome Measure   Help from another person eating meals?: A Lot Help from another person taking care of personal grooming?: A Lot Help from another person toileting, which includes using toliet, bedpan, or urinal?: Total Help from another person bathing (including washing, rinsing, drying)?: A Lot Help from another person to put on and taking off regular upper body clothing?: Total Help from another person to put on and taking off regular lower body clothing?: Total 6 Click Score: 9    End of Session Equipment Utilized During Treatment:  Gait belt  OT Visit Diagnosis: Other abnormalities of gait and mobility (R26.89);Hemiplegia and hemiparesis;Other symptoms and signs involving cognitive function Hemiplegia - Right/Left: Left Hemiplegia - dominant/non-dominant: Non-Dominant   Activity Tolerance Patient tolerated treatment well   Patient Left in chair;with call bell/phone within reach;with chair alarm set   Nurse Communication Mobility status;Need for lift equipment        Time: 1478-2956 OT Time Calculation (min): 32 min  Charges: OT General Charges $OT Visit: 1 Visit OT Treatments $Self Care/Home Management  : 8-22 mins  Lou Cal, OT Acute Rehabilitation Services Pager [phone] Office [phone]    Raymondo Band 11/20/2019, 10:16 AM

## 2019-11-20 NOTE — Progress Notes (Signed)
Providing Compassionate, Quality Care - Together   Neurosurgery Service Progress Note  Subjective: No acute events overnight. No new complaints this morning. Pt sitting on the chair, NAD, VSS  Objective: Vitals:   11/20/19 0325 11/20/19 0500 11/20/19 0752 11/20/19 0925  BP: 140/62  (!) 155/76 137/62  Pulse: (!) 43 60 61   Resp: 13 17 14    Temp: 98.7 F (37.1 C)  98.5 F (36.9 C)   TempSrc: Oral  Oral   SpO2: 98% 98% 99%    Temp (24hrs), Avg:98.3 F (36.8 C), Min:97.7 F (36.5 C), Max:98.7 F (37.1 C)  CBC Latest Ref Rng & Units 11/19/2019 11/18/2019 11/17/2019  WBC 4.0 - 10.5 K/uL 9.7 11.8(H) 13.4(H)  Hemoglobin 13.0 - 17.0 g/dL 15.0 15.2 15.7  Hematocrit 39 - 52 % 45.2 45.7 46.8  Platelets 150 - 400 K/uL 169 175 182   BMP Latest Ref Rng & Units 11/19/2019 11/18/2019 11/17/2019  Glucose 70 - 99 mg/dL 118(H) 115(H) 110(H)  BUN 8 - 23 mg/dL 35(H) 32(H) 25(H)  Creatinine 0.61 - 1.24 mg/dL 1.44(H) 1.43(H) 1.25(H)  Sodium 135 - 145 mmol/L 140 139 141  Potassium 3.5 - 5.1 mmol/L 4.7 4.5 4.4  Chloride 98 - 111 mmol/L 109 105 105  CO2 22 - 32 mmol/L 24 25 26   Calcium 8.9 - 10.3 mg/dL 9.0 9.3 9.5    Intake/Output Summary (Last 24 hours) at 11/20/2019 1050 Last data filed at 11/20/2019 0537 Gross per 24 hour  Intake --  Output 1300 ml  Net -1300 ml    Current Facility-Administered Medications:  .  0.9 %  sodium chloride infusion, , Intravenous, Continuous, Rinehuls, Early Chars, PA-C, Last Rate: 75 mL/hr at 11/19/19 1931, New Bag at 11/19/19 1931 .  albuterol (VENTOLIN HFA) 108 (90 Base) MCG/ACT inhaler 2 puff, 2 puff, Inhalation, Q6H PRN, Vallarie Mare, MD .  alum & mag hydroxide-simeth (MAALOX/MYLANTA) 200-200-20 MG/5ML suspension 15 mL, 15 mL, Oral, Q4H PRN, Vallarie Mare, MD, 15 mL at 11/16/19 1804 .  amLODipine (NORVASC) tablet 10 mg, 10 mg, Oral, Daily, Vallarie Mare, MD, 10 mg at 11/20/19 0925 .  aspirin EC tablet 325 mg, 325 mg, Oral, Daily, 325 mg at  11/20/19 0926 **OR** aspirin suppository 300 mg, 300 mg, Rectal, Daily, Vallarie Mare, MD, 300 mg at 11/16/19 1014 .  atorvastatin (LIPITOR) tablet 80 mg, 80 mg, Oral, q1800, Vallarie Mare, MD, 80 mg at 11/19/19 1844 .  chlorhexidine (PERIDEX) 0.12 % solution 15 mL, 15 mL, Mouth Rinse, BID, Vallarie Mare, MD, 15 mL at 11/20/19 0927 .  Chlorhexidine Gluconate Cloth 2 % PADS 6 each, 6 each, Topical, Daily, Vallarie Mare, MD, 6 each at 11/16/19 0800 .  clopidogrel (PLAVIX) tablet 75 mg, 75 mg, Oral, Daily, Vallarie Mare, MD, 75 mg at 11/20/19 0926 .  dexamethasone (DECADRON) injection 4 mg, 4 mg, Intravenous, Q6H, Vallarie Mare, MD, 4 mg at 11/20/19 0544 .  docusate sodium (COLACE) capsule 100 mg, 100 mg, Oral, Daily PRN, Vallarie Mare, MD, 100 mg at 11/15/19 1257 .  enoxaparin (LOVENOX) injection 40 mg, 40 mg, Subcutaneous, Q24H, Vallarie Mare, MD, 40 mg at 11/19/19 1842 .  furosemide (LASIX) tablet 40 mg, 40 mg, Oral, BID, Vallarie Mare, MD, 40 mg at 11/20/19 0926 .  hydrALAZINE (APRESOLINE) injection 10 mg, 10 mg, Intravenous, Q6H PRN, Vallarie Mare, MD, 10 mg at 11/17/19 [phone] .  insulin aspart (novoLOG) injection 0-9 Units, 0-9 Units,  Subcutaneous, Q6H, Vallarie Mare, MD, 1 Units at 11/20/19 0543 .  isosorbide mononitrate (IMDUR) 24 hr tablet 120 mg, 120 mg, Oral, Daily, Vallarie Mare, MD, 120 mg at 11/20/19 0925 .  MEDLINE mouth rinse, 15 mL, Mouth Rinse, q12n4p, Vallarie Mare, MD, 15 mL at 11/17/19 1505 .  metolazone (ZAROXOLYN) tablet 2.5 mg, 2.5 mg, Oral, Once per day on Mon, Thomas, Jonathan G, MD .  metoprolol tartrate (LOPRESSOR) tablet 12.5 mg, 12.5 mg, Oral, BID, Leonie Man, Pramod S, MD .  multivitamin with minerals tablet 1 tablet, 1 tablet, Oral, Daily, Vallarie Mare, MD, 1 tablet at 11/20/19 0925 .  nitroGLYCERIN (NITROSTAT) SL tablet 0.4 mg, 0.4 mg, Sublingual, Q5 Min x 3 PRN, Vallarie Mare, MD .  ondansetron  [facility]) injection 4 mg, 4 mg, Intravenous, Q6H PRN, Vallarie Mare, MD, 4 mg at 11/16/19 1307 .  pantoprazole (PROTONIX) EC tablet 40 mg, 40 mg, Oral, Daily, Vallarie Mare, MD, 40 mg at 11/20/19 0926 .  potassium chloride (KLOR-CON) CR tablet 10 mEq, 10 mEq, Oral, BID, Vallarie Mare, MD, 10 mEq at 11/20/19 1914   Physical Exam: Awake, sitting on the chair; oriented to age, time and place. No aphasia noted, answered questions appropriately. Follow simple commands, Right gaze preference, able to cross midline, left gaze paresis. RUE and LLE 5/5, LUE: 2/5, unable to grip my hands, LLE: 3/5.  Assessment & Plan: 74 year old gentleman with PMHx of COPD, CHF, HTN, MI, OSA, and known right medial sphenoid wing meningioma who presented to [facility] on 9/22 with left-sided weakness and left facial droop,work-up is revealed multiple acute infarcts right MCA distribution.  April, 2021-CT scan revealed a right sphenoid wing meningioma on. He was supposed to follow up with neurosurgery outpatient on 9/22 to go over results of his MRI, however he had acute onset of left sided weakness and facial droop.  1. Meningioma, large -Large right cavernous area extending into prepontine cistern and sella w/upward displacement of R MCA -Currently not a surgical candidate given acute stroke -Vasogenic edema on Decadron IV 4mg  q6, put on CBG and insulin scale  2. Stroke:Carotid stenosis: Right MCA and ACA infarcts and right MCA narrowing due to large R meningioma.  -F/U with neurology vascular consults -CTA neck 11/16/19-Bulky eccentric plaque at the right bifurcation/proximal right ICA with associated stenosis of up to 65-70% by NASCET criteria.Mild atheromatous change about the left carotid bifurcation/proximal left ICA with no more than mild 10-20% stenosis by NASCET criteria. -CT head - 11/16/19 - Acute infarct right MCA territory in the right frontal parietal lobeextending into the  occipital lobe is unchanged. 9 mm midline shift to theleft unchanged. -TEE revealed: mitral annular calcification with some nobular thickening of mitral leaflets, Dr. Johnsie Cancel doesn't think TEE is needed at this point. -DAPT: aspirin 325 mg daily and plavix 75 daily for 3 months and then plavix alone - Angiogram (9/27) shows severe proximal right ICA stenosis from a calcific plaque with a soft plaque component. Approx. 60% stenosis of RT VA prox and 60% of left VA origin. Possible carotid stenting tomorrow 9/29 -DVT ppx: Enoxaparin 40 mg q24 hr, SCDs  3.Hypertension -BP controlled at this point, high 140s, low 150s -On amlodipine 10, metoprolol 25 bid, furosemide 40 bid and isosorbide mononitrate 120 mg daily -Okay if blood pressure <180/105, hydralazine 10 PRN if BP <180/105 -Long term BP goal 130s-150s  4. AKI on chronic CKD? -Cr level (9/27)1.44. GFR: 54, however back in April 2021: Cr:  1.56, 2011: Cr: 1.54-1.76. BMP pending today -Increase PO intake -continue 75cc/hr continuous -BMP daily -unclear on the I&O at this point    Osie Cheeks 11/20/19 10:50 AM

## 2019-11-20 NOTE — Progress Notes (Signed)
Physical Therapy Treatment Patient Details Name: Charles Sherman MRN: [id number] DOB: 10-04-1945 Today's Date: 11/20/2019    History of Present Illness 74 year old gentleman with known right medial sphenoid wing meningioma scheduled for routine outpatient follow-up after an MRI scan on the 15th.  However patient presented to the outside hospital emergency room with left-sided weakness last night work-up is revealed multiple acute infarcts right MCA distribution. PMH includes CHF, COPD, HTN, MI, OSA.    PT Comments    Pt more interactive and able to self correct with max tactile cues to midline position in sitting. Pt continues with L sided neglect, worked on compensatory techniques to acknowledge of making conscious effort to look to the L. Pt with delayed processing and difficulty sequencing and remains to require maxA for transfers and OOB mobility. Continue to recommend CIR upon d/c for maximal functional recovery for decreased burden on on family for post-discharge care.    Follow Up Recommendations  CIR     Equipment Recommendations  Wheelchair (measurements PT);Wheelchair cushion (measurements PT);Hospital bed    Recommendations for Other Services Rehab consult     Precautions / Restrictions Precautions Precautions: Fall Precaution Comments: L sided neglect Restrictions Weight Bearing Restrictions: No    Mobility  Bed Mobility Overal bed mobility: Needs Assistance Bed Mobility: Rolling;Sidelying to Sit Rolling: Max assist;+2 for physical assistance Sidelying to sit: Max assist;+2 for physical assistance       General bed mobility comments: max directional verbal cues, verbal cues to turn head to the left to find bedrail to reach for with R UE, pt able to assist minimally with  RUE to pull over into L sidelying, maxAX2 for trunk elevation to sit eob, pt without functional capability with L UE to assist with transfer  Transfers Overall transfer level: Needs  assistance Equipment used: 2 person hand held assist (lift with gait belt) Transfers: Sit to/from Stand;Stand Pivot Transfers Sit to Stand: Max assist;+2 physical assistance Stand pivot transfers: Max assist;+2 physical assistance       General transfer comment: pt with L lateral lean, L knee in hyperextension, pt requiring verbal cues for to achieve midline posture, max tactile and verbal cues, to sequence stepping to chair in addiciton to maxA for advancemen to R LE to chair, total assist for L LE management (sliding across floor)  Ambulation/Gait             General Gait Details: unable at this time   Stairs             Wheelchair Mobility    Modified Rankin (Stroke Patients Only) Modified Rankin (Stroke Patients Only) Pre-Morbid Rankin Score: No symptoms Modified Rankin: Severe disability     Balance Overall balance assessment: Needs assistance Sitting-balance support: Single extremity supported;Bilateral upper extremity supported;Feet supported Sitting balance-Leahy Scale: Poor Sitting balance - Comments: pt minA to maintain EOB balance, pt wiht strong L lateral lean however with max tactile and verbal cues pt able to correct to midline but unable to maitnain midline position Postural control: Left lateral lean;Posterior lean Standing balance support: Bilateral upper extremity supported Standing balance-Leahy Scale: Zero Standing balance comment: maxA x2, left lateral lean                            Cognition Arousal/Alertness: Awake/alert Behavior During Therapy: Flat affect Overall Cognitive Status: Impaired/Different from baseline Area of Impairment: Attention;Memory;Following commands;Safety/judgement;Awareness;Problem solving  Current Attention Level: Sustained Memory: Decreased recall of precautions;Decreased short-term memory Following Commands: Follows one step commands with increased time;Follows one step  commands inconsistently Safety/Judgement: Decreased awareness of safety;Decreased awareness of deficits (L sided neglect) Awareness: Intellectual Problem Solving: Slow processing;Decreased initiation;Difficulty sequencing;Requires verbal cues;Requires tactile cues General Comments: pt following simple commands majority of time, very delayed processing requiring max tactile and verbal cues to complete task, especially with L LE and UE      Exercises General Exercises - Lower Extremity Long Arc Quad: AROM;Left;10 reps;Seated    General Comments General comments (skin integrity, edema, etc.): Pt on RA at 93%, HR 47-53 bpm, RN aware      Pertinent Vitals/Pain Pain Assessment: Faces Faces Pain Scale: No hurt    Home Living                      Prior Function            PT Goals (current goals can now be found in the care plan section) Acute Rehab PT Goals Patient Stated Goal: didn't state Progress towards PT goals: Progressing toward goals    Frequency    Min 4X/week      PT Plan Current plan remains appropriate    Co-evaluation PT/OT/SLP Co-Evaluation/Treatment: Yes Reason for Co-Treatment: Complexity of the patient's impairments (multi-system involvement) PT goals addressed during session: Mobility/safety with mobility OT goals addressed during session: Strengthening/ROM      AM-PAC PT "6 Clicks" Mobility   Outcome Measure  Help needed turning from your back to your side while in a flat bed without using bedrails?: A Lot Help needed moving from lying on your back to sitting on the side of a flat bed without using bedrails?: Total Help needed moving to and from a bed to a chair (including a wheelchair)?: Total Help needed standing up from a chair using your arms (e.g., wheelchair or bedside chair)?: Total Help needed to walk in hospital room?: Total Help needed climbing 3-5 steps with a railing? : Total 6 Click Score: 7    End of Session Equipment  Utilized During Treatment: Gait belt Activity Tolerance: Patient tolerated treatment well Patient left: in chair;with chair alarm set;with call bell/phone within reach Nurse Communication: Mobility status;Need for lift equipment (use steady to transfer pt back to bed) PT Visit Diagnosis: Unsteadiness on feet (R26.81);Other abnormalities of gait and mobility (R26.89);Muscle weakness (generalized) (M62.81);Other symptoms and signs involving the nervous system (R29.898);Hemiplegia and hemiparesis Hemiplegia - Right/Left: Left Hemiplegia - dominant/non-dominant: Non-dominant Hemiplegia - caused by: Cerebral infarction     Time: 5643-3295 PT Time Calculation (min) (ACUTE ONLY): 30 min  Charges:  $Therapeutic Activity: 8-22 mins                     Kittie Plater, PT, DPT Acute Rehabilitation Services Pager #: [phone] Office #: [phone]    Koleen Distance Landra Howze 11/20/2019, 10:02 AM

## 2019-11-20 NOTE — H&P (Signed)
Chief Complaint: Patient was today to discuss results of cerebral angiogram and discuss proceeding with intervention tomorrow.  Referring Physician(s): Antony Contras, MD  Supervising Physician: Luanne Bras  Patient Status: [facility] - In-pt  History of Present Illness: Charles Sherman is a 74 y.o. male with a past medical history significant for OSA, COPD, HTN, CHF, MI and meningioma who presented to [facility] with left facial and extremity weakness on 11/14/19. He was found to have an acute right MCA stroke and was transferred to [facility] ([place]) for further evaluation and management. He underwent a diagnostic cerebral angiogram yesterday in NIR which noted approximately 75% stenosis of the proximal right ICA at the bulb secondary to smooth partially calcified plaque in the proximal right ICA, approximately 60% stenosis of the proximal right vertebral artery and 50% at the origin of the left vertebral artery. After review of these results with patient and neurology decision has been made to proceed with intervention in NIR tomorrow using general anesthesia.  Charles Sherman reports fatigue today but otherwise feels ok, he understands the results of his procedure yesterday and is agreeable to proceed with intervention. He asks that we discuss the results of his procedure as well as plan for intervention with his sister Charles Sherman today. Per RN patient received his Plavix this AM but did not receive yesterday.  Past Medical History:  Diagnosis Date  . CHF (congestive heart failure) ([place])   . COPD (chronic obstructive pulmonary disease) ([place])   . HTN (hypertension)   . Myocardial infarct ([place])   . OSA (obstructive sleep apnea)    on CPAP    Past Surgical History:  Procedure Laterality Date  . IR ANGIO INTRA EXTRACRAN SEL COM CAROTID INNOMINATE BILAT MOD SED  11/19/2019  . IR ANGIO VERTEBRAL SEL VERTEBRAL BILAT MOD SED  11/19/2019  . IR US GUIDE VASC ACCESS RIGHT  11/19/2019  .  PERCUTANEOUS CORONARY STENT INTERVENTION (PCI-S)      Allergies: Shrimp [shellfish allergy]  Medications: Prior to Admission medications   Medication Sig Start Date End Date Taking? Authorizing Provider  acetaminophen (TYLENOL) 500 MG tablet Take 500 mg by mouth every 6 (six) hours as needed for mild pain or headache.   Yes [provider]  albuterol (VENTOLIN HFA) 108 (90 Base) MCG/ACT inhaler Inhale 2 puffs into the lungs every 6 (six) hours as needed for wheezing or shortness of breath.   Yes [provider]  amLODipine (NORVASC) 10 MG tablet Take 10 mg by mouth daily.    Yes [provider]  aspirin EC 81 MG tablet Take 81 mg by mouth daily.   Yes [provider]  atorvastatin (LIPITOR) 40 MG tablet Take 40 mg by mouth daily.   Yes [provider]  docusate sodium (COLACE) 100 MG capsule Take 100 mg by mouth daily as needed for mild constipation.   Yes [provider]  furosemide (LASIX) 80 MG tablet Take 80 mg by mouth 2 (two) times daily.   Yes [provider]  hydrochlorothiazide (HYDRODIURIL) 25 MG tablet Take 25 mg by mouth 3 (three) times daily. Do not take if SBP is <100   Yes [provider]  isosorbide mononitrate (IMDUR) 120 MG 24 hr tablet Take 120 mg by mouth daily.   Yes [provider]  lansoprazole (PREVACID) 15 MG capsule Take 15 mg by mouth daily as needed (acid reflux).    Yes [provider]  metolazone (ZAROXOLYN) 2.5 MG tablet Take 2.5 mg by  mouth once a week. On Mondays 30 minutes before furosemide. May take twice daily as needed for edema   Yes [provider]  metoprolol tartrate (LOPRESSOR) 25 MG tablet Take 1 tablet (25 mg total) by mouth 2 (two) times daily. 03/19/19 08/07/20 Yes BranchAlphonse Guild, MD  Multiple Vitamin (MULTIVITAMIN) tablet Take 1 tablet by mouth daily.   Yes [provider]  nitroGLYCERIN (NITROSTAT) 0.4 MG SL tablet Place 0.4 mg under the  tongue every 5 (five) minutes x 3 doses as needed for chest pain (If no relief after 3 rd dose report to the ED).   Yes [provider]  nystatin cream (MYCOSTATIN) Apply 1 application topically 2 (two) times daily as needed for dry skin. 15 gm   Yes [provider]  potassium chloride (KLOR-CON) 10 MEQ tablet Take 20-30 mEq by mouth See admin instructions. Taking 2 tablets (29meg) twice daily     Except take three tablets ( 30 meq) twice day on Mondays only   Yes [provider]     Family History  Problem Relation Age of Onset  . Alzheimer's disease Mother   . Heart disease Father   . Diabetes Sister   . Stomach cancer Brother     Social History   Socioeconomic History  . Marital status: Married    Spouse name: Not on file  . Number of children: Not on file  . Years of education: Not on file  . Highest education level: Not on file  Occupational History  . Not on file  Tobacco Use  . Smoking status: Current Every Day Smoker    Packs/day: 0.50    Types: Cigarettes  . Smokeless tobacco: Never Used  Substance and Sexual Activity  . Alcohol use: Not on file  . Drug use: Not on file  . Sexual activity: Not on file  Other Topics Concern  . Not on file  Social History Narrative  . Not on file   Social Determinants of Health   Financial Resource Strain:   . Difficulty of Paying Living Expenses: Not on file  Food Insecurity:   . Worried About Charity fundraiser in the Last Year: Not on file  . Ran Out of Food in the Last Year: Not on file  Transportation Needs:   . Lack of Transportation (Medical): Not on file  . Lack of Transportation (Non-Medical): Not on file  Physical Activity:   . Days of Exercise per Week: Not on file  . Minutes of Exercise per Session: Not on file  Stress:   . Feeling of Stress : Not on file  Social Connections:   . Frequency of Communication with Friends and Family: Not on file  . Frequency of Social Gatherings with  Friends and Family: Not on file  . Attends Religious Services: Not on file  . Active Member of Clubs or Organizations: Not on file  . Attends Archivist Meetings: Not on file  . Marital Status: Not on file     Review of Systems: A 12 point ROS discussed and pertinent positives are indicated in the HPI above.  All other systems are negative.  Review of Systems  Constitutional: Positive for fatigue. Negative for appetite change, chills and fever.  Respiratory: Negative for cough and shortness of breath.   Cardiovascular: Negative for chest pain.  Gastrointestinal: Negative for abdominal pain, nausea and vomiting.  Musculoskeletal: Negative for back pain.  Neurological: Negative for dizziness, light-headedness, numbness and headaches.  Vital Signs: BP 137/62   Pulse (!) 49   Temp 98.5 F (36.9 C) (Oral)   Resp 14   SpO2 99%   Physical Exam Vitals and nursing note reviewed.  Constitutional:      General: He is not in acute distress. HENT:     Head: Normocephalic.     Mouth/Throat:     Mouth: Mucous membranes are moist.     Pharynx: Oropharynx is clear. No oropharyngeal exudate or posterior oropharyngeal erythema.  Cardiovascular:     Rate and Rhythm: Normal rate and regular rhythm.  Pulmonary:     Effort: Pulmonary effort is normal.     Breath sounds: Normal breath sounds.  Abdominal:     General: There is no distension.     Palpations: Abdomen is soft.     Tenderness: There is no abdominal tenderness.  Skin:    General: Skin is warm and dry.  Neurological:     Mental Status: He is alert.    Alert, awake, and oriented x 3 - somnolent but arouses easy to verbal cues, answers questions appropriately. Speech and comprehension in tact PERRL bilaterally EOMs without obvioust nystagmus or subjective diplopia. Visual fields grossly in tact - patient with difficulty seeing from right eye, can see light/outlines of people and portion of clock on wall but has  difficulty with fine detail. Right haze preference, left gaze paresis.  No obvious facial asymmetry.  Tongue midline Motor power - not assessed Pronator drift - not assessed. Fine motor and coordination - not assessed Gait - not asssessed Romberg - not assessed Heel to toe - not asssessed Distal pulses - not asssesed   MD Evaluation Airway: WNL Heart: WNL Abdomen: WNL Chest/ Lungs: WNL ASA  Classification: 3 Mallampati/Airway Score: Two   Imaging: CT ANGIO HEAD W OR WO CONTRAST  Result Date: 11/14/2019 CLINICAL DATA:  Acute neuro deficit. Rule out stroke. Intracranial mass lesion. EXAM: CT ANGIOGRAPHY HEAD TECHNIQUE: Multidetector CT imaging of the head was performed using the standard protocol during bolus administration of intravenous contrast. Multiplanar CT image reconstructions and MIPs were obtained to evaluate the vascular anatomy. CONTRAST:  65mL OMNIPAQUE IOHEXOL 350 MG/ML SOLN COMPARISON:  MRI head 11/14/2019 FINDINGS: CT HEAD Brain: Large mass arising from the right cavernous sinus with peripheral calcification. This showed enhancement on MRI and is most consistent with a large meningioma. There is extension into the prepontine cistern on the right with mild mass-effect on the ventral pons on the right. There is surrounding white matter edema and mass-effect. 7 mm midline shift to the left unchanged. Tumor extends into the sella. Negative for acute infarct or hemorrhage. Patchy white matter hypodensity bilaterally most consistent with chronic microvascular ischemia as well as vasogenic edema from the tumor on the right. Vascular: Atherosclerotic calcification in the distal vertebral and cavernous carotid arteries bilaterally. Negative for hyperdense vessel Skull: Negative Sinuses: Mild mucosal edema paranasal sinuses. Orbits: Bilateral proptosis.  No orbital mass. CTA HEAD Anterior circulation: Heavy calcification throughout the cavernous carotid bilaterally with mild stenosis  bilaterally. Right cavernous sinus meningioma abuts the supraclinoid internal carotid artery without encasement or narrowing. Right middle cerebral artery is up lifted by tumor. Anterior and middle cerebral arteries patent bilaterally without stenosis or large vessel occlusion. Posterior circulation: Atherosclerotic calcification and mild stenosis distal vertebral artery bilaterally. MRI abnormality distal left vertebral artery corresponding to calcified plaque. No thrombus or filling defect. PICA patent bilaterally. Basilar is patent bilaterally. AICA patent bilaterally. The mass abuts the superior  cerebellar artery on the right without occlusion. Posterior cerebral arteries patent bilaterally without occlusion or stenosis. Venous sinuses: Normal venous enhancement. Anatomic variants: None IMPRESSION: 1. Large meningioma right cavernous sinus extending into the prepontine cistern on the right. Mass enters the sella. 2. Extensive atherosclerotic calcification in the cavernous carotid bilaterally with mild stenosis. The tumor abuts the right cavernous and supraclinoid internal carotid artery without encasement or occlusion. 3. Mild calcific stenosis in the distal vertebral artery bilaterally. No thrombus identified in the left vertebral artery as question on MRI. Electronically Signed   By: Franchot Gallo M.D.   On: 11/14/2019 21:35   CT HEAD WO CONTRAST  Result Date: 11/16/2019 CLINICAL DATA:  Stroke follow-up EXAM: CT HEAD WITHOUT CONTRAST TECHNIQUE: Contiguous axial images were obtained from the base of the skull through the vertex without intravenous contrast. COMPARISON:  CT head 11/14/2019.  MRI head 11/14/2019 FINDINGS: Brain: Hypodensity in the right frontal parietal cortex compatible with acute infarct as noted on MRI. This extends into the right occipital lobe. No associated hemorrhage. Chronic microvascular ischemic changes in the white matter. Large calcified meningioma right cavernous sinus  unchanged. This extends into the prepontine cistern with mild mass-effect on the pons on the right. There is surrounding vasogenic edema in the right temporoparietal lobe. Vascular: Negative for hyperdense vessel Skull: Negative Sinuses/Orbits: Mild mucosal edema paranasal sinuses. Negative orbit. Other: None IMPRESSION: Acute infarct right MCA territory in the right frontal parietal lobe extending into the occipital lobe is unchanged. No acute hemorrhage Large right cavernous sinus meningioma unchanged. There is vasogenic edema in the right temporoparietal lobe. 9 mm midline shift to the left unchanged. Electronically Signed   By: Franchot Gallo M.D.   On: 11/16/2019 11:38   CT ANGIO NECK W OR WO CONTRAST  Result Date: 11/16/2019 CLINICAL DATA:  Follow-up examination for acute stroke. EXAM: CT ANGIOGRAPHY NECK TECHNIQUE: Multidetector CT imaging of the neck was performed using the standard protocol during bolus administration of intravenous contrast. Multiplanar CT image reconstructions and MIPs were obtained to evaluate the vascular anatomy. Carotid stenosis measurements (when applicable) are obtained utilizing NASCET criteria, using the distal internal carotid diameter as the denominator. CONTRAST:  172mL OMNIPAQUE IOHEXOL 350 MG/ML SOLN COMPARISON:  Prior CTA from 11/14/2019. FINDINGS: Aortic arch: Examination technically limited by positioning. Visualized aortic arch normal in caliber with normal branch pattern. Mild-to-moderate atheromatous change about the arch and origin of the great vessels. No hemodynamically significant stenosis. Right carotid system: Right common carotid artery patent from its origin to the bifurcation without stenosis. Bulky eccentric plaque at the right bifurcation/proximal right ICA with associated stenosis of up to 65-70% by NASCET criteria. Right ICA tortuous but otherwise patent to the skull base without stenosis, dissection or occlusion. Left carotid system: Left CCA patent  from its origin to the bifurcation without stenosis. Scattered calcified plaque about the left bifurcation/proximal left ICA with no more than mild 10-20% stenosis by NASCET criteria. Left ICA tortuous but otherwise widely patent to the skull base without stenosis, dissection or occlusion. Vertebral arteries: Both vertebral arteries arise from the subclavian arteries. No proximal subclavian artery stenosis. Mild scattered plaque about the origins and proximal vertebral arteries without hemodynamically significant stenosis. Vertebral arteries patent within the neck without significant stenosis, dissection, or occlusion. Skeleton: No visible acute osseous abnormality, although evaluation limited by positioning. No discrete or worrisome osseous lesions. Mild multilevel cervical spondylosis, most pronounced at C5-6. Other neck: No other acute soft tissue abnormality within the neck. No visible  mass or adenopathy. Upper chest: Scattered atelectatic changes noted within the visualized lungs. A degree of pulmonary interstitial congestion may be present as well. No other definite acute abnormality. IMPRESSION: 1. Bulky eccentric plaque at the right bifurcation/proximal right ICA with associated stenosis of up to 65-70% by NASCET criteria. 2. Mild atheromatous change about the left carotid bifurcation/proximal left ICA with no more than mild 10-20% stenosis by NASCET criteria. 3. Wide patency of both vertebral arteries within the neck. 4. Diffuse tortuosity of the major arterial vasculature of the neck, suggesting chronic underlying hypertension. Electronically Signed   By: Jeannine Boga M.D.   On: 11/16/2019 02:05   IR US Guide Vasc Access Right  Result Date: 11/20/2019 CLINICAL DATA:  History of left sided hemiplegia, right-sided gaze deviation, and right cerebral meningioma. 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 EXAM: BILATERAL COMMON CAROTID AND  INNOMINATE ANGIOGRAPHY COMPARISON:  CT angiogram of the head and neck of November 14, 2019. MEDICATIONS: Heparin 2000 units IV. No antibiotic was administered within 1 hour of the procedure. ANESTHESIA/SEDATION: Versed 0 mg IV; Fentanyl 12.5 mcg IV Moderate Sedation Time:  30 minutes The patient was continuously monitored during the procedure by the interventional radiology nurse under my direct supervision. CONTRAST:  Isovue 300 approximately 70 mL FLUOROSCOPY TIME:  Fluoroscopy Time: 11 minutes 24 seconds (1629 mGy). COMPLICATIONS: None immediate. TECHNIQUE: Informed written consent was obtained from the patient after a thorough discussion of the procedural risks, benefits and alternatives. All questions were addressed. Maximal Sterile Barrier Technique was utilized including caps, mask, sterile gowns, sterile gloves, sterile drape, hand hygiene and skin antiseptic. A timeout was performed prior to the initiation of the procedure. The right forearm to the wrist was prepped and draped in the usual sterile manner. The right radial artery was then identified with ultrasound, and its morphology documented. A dorsal palmar anastomosis was verified to be present. Using ultrasound guidance, trans radial access was obtained with a micropuncture set over a 0.018 inch micro guidewire. A 4/5 French radial sheath was inserted. The obturator, and the micro guidewire were removed. Good aspiration was obtained from the side port of the sheath. A cocktail of 2000 units of heparin, and 200 mcg of nitroglycerin was then injected in diluted form through the sheath without event. A right radial arteriogram was obtained. Following the procedure, hemostasis at the right radial puncture site was obtained with a wrist band. Distal right radial pulse was verified to be present. FINDINGS: The origin of the right vertebral artery is widely patent. The vessel opacifies to the cranial skull base. There is approximately 60% stenosis of the  proximal right vertebral artery at the level of C5-C6. More distally the vessel is seen to opacify to the cranial skull base. Wide patency is maintained of the right vertebrobasilar junction and the right posterior-inferior cerebellar artery. The basilar artery, the posterior cerebral arteries, the superior cerebellar arteries and the anterior-inferior cerebellar arteries opacify into the capillary and venous phases. Displacement of the right posterior cerebral artery in the P2 P3 regions slightly medially is seen secondary to the more laterally placed large meningioma. Faint diffuse tumor blush is seen involving the superolateral aspect of the meningioma. Unopacified blood is seen in the basilar artery from the contralateral vertebral artery. Patency of the dural venous sinuses is seen. A right common carotid arteriogram demonstrates a partially calcified plaque at the level of the common carotid bifurcation eccentrically positioned into the right  internal carotid artery. The right external carotid artery and its major branches are widely patent. The right internal carotid artery at the bulb demonstrates approximately 75% stenosis secondary to a partially calcified atherosclerotic plaque. Moderate tortuosity is noted of the junction of the middle and the proximal 1/3 of the right internal carotid artery. Vessel opacifies to the cranial skull base. Patency is maintained of the petrous, cavernous and supraclinoid segments. Straightening of the supraclinoid segment is seen related to the large meningioma lying laterally and superiorly. The right middle cerebral artery is displaced superiorly and slightly medially with patency of the trifurcation branches being maintained. A hypoplastic right anterior cerebral artery A2 segment and distally is noted to be present. The venous phase demonstrates patency of the dural venous sinuses. Prominence with mild tortuosity of the cerebral veins overlying the mid to posterior  frontal regions is seen. Also noted is a diffuse homogeneous blush involving the large known right hemispheric meningioma. The left common carotid arteriogram demonstrates the left external carotid artery origin and its branches to be widely patent. The left internal carotid artery at the bulb has a smooth irregular configuration secondary to a plaque. No evidence of intraluminal filling defects or of ulcerations is seen. The vessel is seen to opacify to the cranial skull base. The petrous, cavernous and supraclinoid segments are widely patent, with arteriosclerotic irregularity involving the proximal cavernous segment without significant stenosis. The left middle cerebral artery and the left anterior cerebral artery opacify into the capillary and venous phases. Cross-filling of the right anterior cerebral A2 segment and distally is seen via the anterior communicating artery. The left vertebral artery origin demonstrates approximately 50% stenosis at its origin. The vessel, otherwise, opacifies widely to the cranial skull base. No left posterior-inferior cerebral artery is identified. However, noted is a left anterior-inferior cerebellar artery/posteroinferior cerebellar artery complex. The opacified portion of the basilar artery, the posterior cerebral arteries, the superior cerebellar arteries and the anterior-inferior cerebellar arteries demonstrate patency with unopacified blood seen in the basilar artery from the contralateral vertebral vertebral artery. IMPRESSION: Approximately 75% stenosis of the proximal right ICA at the bulb secondary to a smooth partially calcified arteriosclerotic plaque in the proximal right internal carotid artery. Approximately 60% stenosis of the proximal right vertebral artery, and of 50% at the origin of the left vertebral artery. A homogeneous tumor blush involving the right cerebral hemisphere of the known meningioma. Mass-effect on the right ICA supraclinoid segment, and the  right middle cerebral artery secondary to the laterally positioned large tumor with circumferential encasement of the carotid at the level of the right cavernous sinus. PLAN: Discussed findings with referring stroke neurologist. To discuss endovascular revascularization of the right internal carotid artery proximally with the patient. Electronically Signed   By: Luanne Bras M.D.   On: 11/19/2019 15:38   ECHOCARDIOGRAM COMPLETE  Result Date: 11/16/2019    ECHOCARDIOGRAM REPORT   Patient Name:   HURMAN KETELSEN [facility] Date of Exam: 11/16/2019 Medical Rec #:  [id number]             Height:       71.0 in Accession #:    [id number]            Weight:       262.4 lb Date of Birth:  September 10, 1945              BSA:          2.367 m Patient Age:    75 years  BP:           191/98 mmHg Patient Gender: M                     HR:           52 bpm. Exam Location:  Inpatient Procedure: 2D Echo, Cardiac Doppler and Color Doppler Indications:    CVA  History:        Patient has prior history of Echocardiogram examinations, most                 recent 03/28/2019. CHF, Previous Myocardial Infarction, COPD; Risk                 Factors:Current Smoker and Hypertension.  Sonographer:    Dustin Flock Referring Phys: [id number] [place]  1. There is a small calcified mass attached to the anterior MV leaflet. It is moves with the leaflet. It measures 0.4 cm x 0.7 cm. It is best seen in the PLAX view. 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 There is no significant regurgitation or destruction of the valve to suggest endocarditis. 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 Would recommend a TEE for better characterization in the setting of recent stroke. The mitral valve is degenerative. Trivial mitral valve regurgitation. No evidence of mitral stenosis.  2. Left ventricular ejection fraction, by estimation, is 60 to 65%. The left ventricle has normal function. The left  ventricle has no regional wall motion abnormalities. There is mild concentric left ventricular hypertrophy. Left ventricular diastolic parameters are consistent with Grade I diastolic dysfunction (impaired relaxation).  3. Right ventricular systolic function is normal. The right ventricular size is normal. Tricuspid regurgitation signal is inadequate for assessing PA pressure.  4. Left atrial size was mildly dilated.  5. The aortic valve is tricuspid. There is mild calcification of the aortic valve. There is mild thickening of the aortic valve. Aortic valve regurgitation is not visualized. Mild aortic valve sclerosis is present, with no evidence of aortic valve stenosis.  6. The inferior vena cava is normal in size with greater than 50% respiratory variability, suggesting right atrial pressure of 3 mmHg. Conclusion(s)/Recommendation(s): Findings concerning for MV mass, would recommend Transesophageal Echocardiogram for clarification. FINDINGS  Left Ventricle: Left ventricular ejection fraction, by estimation, is 60 to 65%. The left ventricle has normal function. The left ventricle has no regional wall motion abnormalities. The left ventricular internal cavity size was normal in size. There is  mild concentric left ventricular hypertrophy. Left ventricular diastolic parameters are consistent with Grade I diastolic dysfunction (impaired relaxation). Normal left ventricular filling pressure. Right Ventricle: The right ventricular size is normal. No increase in right ventricular wall thickness. Right ventricular systolic function is normal. Tricuspid regurgitation signal is inadequate for assessing PA pressure. Left Atrium: Left atrial size was mildly dilated. Right Atrium: Right atrial size was normal in size. Pericardium: Trivial pericardial effusion is present. Mitral Valve: There is a small calcified mass attached to the anterior MV leaflet. It is moves with the leaflet. It measures 0.4 cm x 0.7 cm. It is best seen  in the PLAX view. 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 There is no significant regurgitation or destruction of the valve to suggest endocarditis. 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 Would recommend a TEE for better characterization in the setting of recent stroke. The mitral valve is degenerative in appearance. There is mild calcification of the  anterior and posterior mitral valve leaflet(s). Trivial mitral valve regurgitation. No evidence of mitral valve stenosis. Tricuspid Valve: The tricuspid valve is grossly normal. Tricuspid valve regurgitation is trivial. No evidence of tricuspid stenosis. Aortic Valve: The aortic valve is tricuspid. There is mild calcification of the aortic valve. There is mild thickening of the aortic valve. Aortic valve regurgitation is not visualized. Mild aortic valve sclerosis is present, with no evidence of aortic valve stenosis. Pulmonic Valve: The pulmonic valve was grossly normal. Pulmonic valve regurgitation is not visualized. No evidence of pulmonic stenosis. Aorta: The aortic root is normal in size and structure. Venous: The inferior vena cava is normal in size with greater than 50% respiratory variability, suggesting right atrial pressure of 3 mmHg. IAS/Shunts: The atrial septum is grossly normal.  LEFT VENTRICLE PLAX 2D LVIDd:         4.50 cm  Diastology LVIDs:         3.10 cm  LV e' medial:    5.87 cm/s LV PW:         1.20 cm  LV E/e' medial:  10.9 LV IVS:        1.20 cm  LV e' lateral:   7.72 cm/s LVOT diam:     2.50 cm  LV E/e' lateral: 8.3 LV SV:         135 LV SV Index:   57 LVOT Area:     4.91 cm  RIGHT VENTRICLE RV Basal diam:  3.40 cm RV S prime:     18.00 cm/s TAPSE (M-mode): 3.8 cm LEFT ATRIUM           Index       RIGHT ATRIUM           Index LA diam:      4.90 cm 2.07 cm/m  RA Area:     21.30 cm LA Vol (A2C): 61.8 ml 26.11 ml/m RA Volume:   61.30 ml  25.90 ml/m LA Vol (A4C): 96.9 ml 40.95 ml/m  AORTIC VALVE  LVOT Vmax:   110.00 cm/s LVOT Vmean:  72.600 cm/s LVOT VTI:    0.275 m  AORTA Ao Root diam: 3.40 cm MITRAL VALVE MV Area (PHT): 3.72 cm    SHUNTS MV Decel Time: 204 msec    Systemic VTI:  0.28 m MV E velocity: 63.80 cm/s  Systemic Diam: 2.50 cm MV A velocity: 55.30 cm/s MV E/A ratio:  1.15 Eleonore Chiquito MD Electronically signed by Eleonore Chiquito MD Signature Date/Time: 11/16/2019/11:52:00 AM    Final    VAS US CAROTID  Result Date: 11/15/2019 Carotid Arterial Duplex Study Indications:  TIA. Risk Factors: Hypertension, hyperlipidemia. Performing Technologist: Griffin Basil RCT RDMS  Examination Guidelines: A complete evaluation includes B-mode imaging, spectral Doppler, color Doppler, and power Doppler as needed of all accessible portions of each vessel. Bilateral testing is considered an integral part of a complete examination. Limited examinations for reoccurring indications may be performed as noted.  Right Carotid Findings: +----------+--------+--------+--------+------------------+---------------------+           PSV cm/sEDV cm/sStenosisPlaque DescriptionComments              +----------+--------+--------+--------+------------------+---------------------+ CCA Prox  73      14                                                      +----------+--------+--------+--------+------------------+---------------------+  CCA Distal53      8                                 intimal thickening    +----------+--------+--------+--------+------------------+---------------------+ ICA Prox                                            limited evaluation                                                        due to extensive                                                          calcified plaque      +----------+--------+--------+--------+------------------+---------------------+ ECA       105     15                                                       +----------+--------+--------+--------+------------------+---------------------+ +----------+--------+-------+--------+-------------------+           PSV cm/sEDV cmsDescribeArm Pressure (mmHG) +----------+--------+-------+--------+-------------------+ Subclavian115                                        +----------+--------+-------+--------+-------------------+ +---------+--------+--+--------+--+---------+ VertebralPSV cm/s60EDV cm/s16Antegrade +---------+--------+--+--------+--+---------+  Left Carotid Findings: +----------+--------+--------+--------+------------------+------------------+           PSV cm/sEDV cm/sStenosisPlaque DescriptionComments           +----------+--------+--------+--------+------------------+------------------+ CCA Prox  78      10                                                   +----------+--------+--------+--------+------------------+------------------+ CCA Distal56      13                                intimal thickening +----------+--------+--------+--------+------------------+------------------+ ICA Prox  92      24      1-39%                     intimal thickening +----------+--------+--------+--------+------------------+------------------+ ICA Distal109     17                                                   +----------+--------+--------+--------+------------------+------------------+ ECA       100     6                                                    +----------+--------+--------+--------+------------------+------------------+ +----------+--------+--------+--------+-------------------+  PSV cm/sEDV cm/sDescribeArm Pressure (mmHG) +----------+--------+--------+--------+-------------------+ [id number]     2                                   +----------+--------+--------+--------+-------------------+ +---------+--------+--+--------+--+---------+ VertebralPSV cm/s43EDV cm/s12Antegrade  +---------+--------+--+--------+--+---------+   Summary: Right         Non Diagnostic Right Ica evaluation due to extensive calcified Carotid:      plaque. Left Carotid: Velocities in the left ICA are consistent with a 1-39% stenosis. Vertebrals: Bilateral vertebral arteries demonstrate antegrade flow. *See table(s) above for measurements and observations.  Electronically signed by Servando Snare MD on 11/15/2019 at 7:10:45 PM.    Final    IR ANGIO INTRA EXTRACRAN SEL COM CAROTID INNOMINATE BILAT MOD SED  Result Date: 11/20/2019 CLINICAL DATA:  History of left sided hemiplegia, right-sided gaze deviation, and right cerebral meningioma. 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 EXAM: BILATERAL COMMON CAROTID AND INNOMINATE ANGIOGRAPHY COMPARISON:  CT angiogram of the head and neck of November 14, 2019. MEDICATIONS: Heparin 2000 units IV. No antibiotic was administered within 1 hour of the procedure. ANESTHESIA/SEDATION: Versed 0 mg IV; Fentanyl 12.5 mcg IV Moderate Sedation Time:  30 minutes The patient was continuously monitored during the procedure by the interventional radiology nurse under my direct supervision. CONTRAST:  Isovue 300 approximately 70 mL FLUOROSCOPY TIME:  Fluoroscopy Time: 11 minutes 24 seconds (1629 mGy). COMPLICATIONS: None immediate. TECHNIQUE: Informed written consent was obtained from the patient after a thorough discussion of the procedural risks, benefits and alternatives. All questions were addressed. Maximal Sterile Barrier Technique was utilized including caps, mask, sterile gowns, sterile gloves, sterile drape, hand hygiene and skin antiseptic. A timeout was performed prior to the initiation of the procedure. The right forearm to the wrist was prepped and draped in the usual sterile manner. The right radial artery was then identified with ultrasound, and its morphology documented. A dorsal palmar anastomosis was verified to be  present. Using ultrasound guidance, trans radial access was obtained with a micropuncture set over a 0.018 inch micro guidewire. A 4/5 French radial sheath was inserted. The obturator, and the micro guidewire were removed. Good aspiration was obtained from the side port of the sheath. A cocktail of 2000 units of heparin, and 200 mcg of nitroglycerin was then injected in diluted form through the sheath without event. A right radial arteriogram was obtained. Following the procedure, hemostasis at the right radial puncture site was obtained with a wrist band. Distal right radial pulse was verified to be present. FINDINGS: The origin of the right vertebral artery is widely patent. The vessel opacifies to the cranial skull base. There is approximately 60% stenosis of the proximal right vertebral artery at the level of C5-C6. More distally the vessel is seen to opacify to the cranial skull base. Wide patency is maintained of the right vertebrobasilar junction and the right posterior-inferior cerebellar artery. The basilar artery, the posterior cerebral arteries, the superior cerebellar arteries and the anterior-inferior cerebellar arteries opacify into the capillary and venous phases. Displacement of the right posterior cerebral artery in the P2 P3 regions slightly medially is seen secondary to the more laterally placed large meningioma. Faint diffuse tumor blush is seen involving the superolateral aspect of the meningioma. Unopacified blood is seen in the basilar artery from the contralateral vertebral artery. Patency of the dural venous sinuses is seen. A right common carotid  arteriogram demonstrates a partially calcified plaque at the level of the common carotid bifurcation eccentrically positioned into the right internal carotid artery. The right external carotid artery and its major branches are widely patent. The right internal carotid artery at the bulb demonstrates approximately 75% stenosis secondary to a  partially calcified atherosclerotic plaque. Moderate tortuosity is noted of the junction of the middle and the proximal 1/3 of the right internal carotid artery. Vessel opacifies to the cranial skull base. Patency is maintained of the petrous, cavernous and supraclinoid segments. Straightening of the supraclinoid segment is seen related to the large meningioma lying laterally and superiorly. The right middle cerebral artery is displaced superiorly and slightly medially with patency of the trifurcation branches being maintained. A hypoplastic right anterior cerebral artery A2 segment and distally is noted to be present. The venous phase demonstrates patency of the dural venous sinuses. Prominence with mild tortuosity of the cerebral veins overlying the mid to posterior frontal regions is seen. Also noted is a diffuse homogeneous blush involving the large known right hemispheric meningioma. The left common carotid arteriogram demonstrates the left external carotid artery origin and its branches to be widely patent. The left internal carotid artery at the bulb has a smooth irregular configuration secondary to a plaque. No evidence of intraluminal filling defects or of ulcerations is seen. The vessel is seen to opacify to the cranial skull base. The petrous, cavernous and supraclinoid segments are widely patent, with arteriosclerotic irregularity involving the proximal cavernous segment without significant stenosis. The left middle cerebral artery and the left anterior cerebral artery opacify into the capillary and venous phases. Cross-filling of the right anterior cerebral A2 segment and distally is seen via the anterior communicating artery. The left vertebral artery origin demonstrates approximately 50% stenosis at its origin. The vessel, otherwise, opacifies widely to the cranial skull base. No left posterior-inferior cerebral artery is identified. However, noted is a left anterior-inferior cerebellar  artery/posteroinferior cerebellar artery complex. The opacified portion of the basilar artery, the posterior cerebral arteries, the superior cerebellar arteries and the anterior-inferior cerebellar arteries demonstrate patency with unopacified blood seen in the basilar artery from the contralateral vertebral vertebral artery. IMPRESSION: Approximately 75% stenosis of the proximal right ICA at the bulb secondary to a smooth partially calcified arteriosclerotic plaque in the proximal right internal carotid artery. Approximately 60% stenosis of the proximal right vertebral artery, and of 50% at the origin of the left vertebral artery. A homogeneous tumor blush involving the right cerebral hemisphere of the known meningioma. Mass-effect on the right ICA supraclinoid segment, and the right middle cerebral artery secondary to the laterally positioned large tumor with circumferential encasement of the carotid at the level of the right cavernous sinus. PLAN: Discussed findings with referring stroke neurologist. To discuss endovascular revascularization of the right internal carotid artery proximally with the patient. Electronically Signed   By: Luanne Bras M.D.   On: 11/19/2019 15:38   IR ANGIO VERTEBRAL SEL VERTEBRAL BILAT MOD SED  Result Date: 11/20/2019 CLINICAL DATA:  History of left sided hemiplegia, right-sided gaze deviation, and right cerebral meningioma. 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 EXAM: BILATERAL COMMON CAROTID AND INNOMINATE ANGIOGRAPHY COMPARISON:  CT angiogram of the head and neck of November 14, 2019. MEDICATIONS: Heparin 2000 units IV. No antibiotic was administered within 1 hour of the procedure. ANESTHESIA/SEDATION: Versed 0 mg IV; Fentanyl 12.5 mcg IV Moderate Sedation Time:  30 minutes The patient was continuously  monitored during the procedure by the interventional radiology nurse under my direct supervision. CONTRAST:  Isovue  300 approximately 70 mL FLUOROSCOPY TIME:  Fluoroscopy Time: 11 minutes 24 seconds (1629 mGy). COMPLICATIONS: None immediate. TECHNIQUE: Informed written consent was obtained from the patient after a thorough discussion of the procedural risks, benefits and alternatives. All questions were addressed. Maximal Sterile Barrier Technique was utilized including caps, mask, sterile gowns, sterile gloves, sterile drape, hand hygiene and skin antiseptic. A timeout was performed prior to the initiation of the procedure. The right forearm to the wrist was prepped and draped in the usual sterile manner. The right radial artery was then identified with ultrasound, and its morphology documented. A dorsal palmar anastomosis was verified to be present. Using ultrasound guidance, trans radial access was obtained with a micropuncture set over a 0.018 inch micro guidewire. A 4/5 French radial sheath was inserted. The obturator, and the micro guidewire were removed. Good aspiration was obtained from the side port of the sheath. A cocktail of 2000 units of heparin, and 200 mcg of nitroglycerin was then injected in diluted form through the sheath without event. A right radial arteriogram was obtained. Following the procedure, hemostasis at the right radial puncture site was obtained with a wrist band. Distal right radial pulse was verified to be present. FINDINGS: The origin of the right vertebral artery is widely patent. The vessel opacifies to the cranial skull base. There is approximately 60% stenosis of the proximal right vertebral artery at the level of C5-C6. More distally the vessel is seen to opacify to the cranial skull base. Wide patency is maintained of the right vertebrobasilar junction and the right posterior-inferior cerebellar artery. The basilar artery, the posterior cerebral arteries, the superior cerebellar arteries and the anterior-inferior cerebellar arteries opacify into the capillary and venous phases. Displacement  of the right posterior cerebral artery in the P2 P3 regions slightly medially is seen secondary to the more laterally placed large meningioma. Faint diffuse tumor blush is seen involving the superolateral aspect of the meningioma. Unopacified blood is seen in the basilar artery from the contralateral vertebral artery. Patency of the dural venous sinuses is seen. A right common carotid arteriogram demonstrates a partially calcified plaque at the level of the common carotid bifurcation eccentrically positioned into the right internal carotid artery. The right external carotid artery and its major branches are widely patent. The right internal carotid artery at the bulb demonstrates approximately 75% stenosis secondary to a partially calcified atherosclerotic plaque. Moderate tortuosity is noted of the junction of the middle and the proximal 1/3 of the right internal carotid artery. Vessel opacifies to the cranial skull base. Patency is maintained of the petrous, cavernous and supraclinoid segments. Straightening of the supraclinoid segment is seen related to the large meningioma lying laterally and superiorly. The right middle cerebral artery is displaced superiorly and slightly medially with patency of the trifurcation branches being maintained. A hypoplastic right anterior cerebral artery A2 segment and distally is noted to be present. The venous phase demonstrates patency of the dural venous sinuses. Prominence with mild tortuosity of the cerebral veins overlying the mid to posterior frontal regions is seen. Also noted is a diffuse homogeneous blush involving the large known right hemispheric meningioma. The left common carotid arteriogram demonstrates the left external carotid artery origin and its branches to be widely patent. The left internal carotid artery at the bulb has a smooth irregular configuration secondary to a plaque. No evidence of intraluminal filling defects or of ulcerations  is seen. The vessel is  seen to opacify to the cranial skull base. The petrous, cavernous and supraclinoid segments are widely patent, with arteriosclerotic irregularity involving the proximal cavernous segment without significant stenosis. The left middle cerebral artery and the left anterior cerebral artery opacify into the capillary and venous phases. Cross-filling of the right anterior cerebral A2 segment and distally is seen via the anterior communicating artery. The left vertebral artery origin demonstrates approximately 50% stenosis at its origin. The vessel, otherwise, opacifies widely to the cranial skull base. No left posterior-inferior cerebral artery is identified. However, noted is a left anterior-inferior cerebellar artery/posteroinferior cerebellar artery complex. The opacified portion of the basilar artery, the posterior cerebral arteries, the superior cerebellar arteries and the anterior-inferior cerebellar arteries demonstrate patency with unopacified blood seen in the basilar artery from the contralateral vertebral vertebral artery. IMPRESSION: Approximately 75% stenosis of the proximal right ICA at the bulb secondary to a smooth partially calcified arteriosclerotic plaque in the proximal right internal carotid artery. Approximately 60% stenosis of the proximal right vertebral artery, and of 50% at the origin of the left vertebral artery. A homogeneous tumor blush involving the right cerebral hemisphere of the known meningioma. Mass-effect on the right ICA supraclinoid segment, and the right middle cerebral artery secondary to the laterally positioned large tumor with circumferential encasement of the carotid at the level of the right cavernous sinus. PLAN: Discussed findings with referring stroke neurologist. To discuss endovascular revascularization of the right internal carotid artery proximally with the patient. Electronically Signed   By: Luanne Bras M.D.   On: 11/19/2019 15:38    Labs:  CBC: Recent  Labs    11/15/19 1143 11/17/19 0504 11/18/19 0512 11/19/19 0550  WBC 15.1* 13.4* 11.8* 9.7  HGB 15.9 15.7 15.2 15.0  HCT 47.6 46.8 45.7 45.2  PLT 185 182 175 169    COAGS: No results for input(s): INR, APTT in the last 8760 hours.  BMP: Recent Labs    11/15/19 1143 11/17/19 0504 11/18/19 0512 11/19/19 0550  NA 142 141 139 140  K 4.7 4.4 4.5 4.7  CL 107 105 105 109  CO2 24 26 25 24   GLUCOSE 121* 110* 115* 118*  BUN 18 25* 32* 35*  CALCIUM 9.9 9.5 9.3 9.0  CREATININE 1.19 1.25* 1.43* 1.44*  GFRNONAA 59* 56* 47* 47*  GFRAA >60 >60 55* 54*    LIVER FUNCTION TESTS: No results for input(s): BILITOT, AST, ALT, ALKPHOS, PROT, ALBUMIN in the last 8760 hours.  TUMOR MARKERS: No results for input(s): AFPTM, CEA, CA199, CHROMGRNA in the last 8760 hours.  Assessment and Plan:  74 y/o M with recent right MCA CVA s/p diagnostic cerebral angiogram yesterday which noted approximately 75% stenosis of the proximal right ICA at the bulb secondary to smooth partially calcified plaque in the proximal right ICA, approximately 60% stenosis of the proximal right vertebral artery and 50% at the origin of the left vertebral artery. After discussion with patient and neurology decision has been made to proceed with intervention under general anesthesia in NIR.  We will attempt to proceed with intervention under general anesthesia tomorrow in [facility] pending anesthesia availability and emergent procedures in NIR. Patient to be NPO after midnight, p2y12 drawn today and will be reviewed once result.  Risks and benefits of cerbral arteriogram with intervention were discussed with the patient including, but not limited to bleeding, infection, vascular injury, contrast induced renal failure, stroke, reperfusion hemorrhage, or even death.  This interventional procedure involves  the use of X-rays and because of the nature of the planned procedure, it is possible that we will have prolonged use of X-ray  fluoroscopy.  Potential radiation risks to you include (but are not limited to) the following: - A slightly elevated risk for cancer  several years later in life. This risk is typically less than 0.5% percent. This risk is low in comparison to the normal incidence of human cancer, which is 33% for women and 50% for men according to the [employer]. - Radiation induced injury can include skin redness, resembling a rash, tissue breakdown / ulcers and hair loss (which can be temporary or permanent).  The likelihood of either of these occurring depends on the difficulty of the procedure and whether you are sensitive to radiation due to previous procedures, disease, or genetic conditions.   IF your procedure requires a prolonged use of radiation, you will be notified and given written instructions for further action.  It is your responsibility to monitor the irradiated area for the 2 weeks following the procedure and to notify your physician if you are concerned that you have suffered a radiation induced injury.    All of the patient's questions were answered, patient is agreeable to proceed.  Consent signed and in chart.  Thank you for this interesting consult.  I greatly enjoyed meeting ANGAD NABERS and look forward to participating in their care.  A copy of this report was sent to the requesting provider on this date.  Electronically Signed: Joaquim Nam, PA-C 11/20/2019, 12:10 PM   I spent a total of 40 Minutes  in face to face in clinical consultation, greater than 50% of which was counseling/coordinating care for cerebral angiogram with intervention.

## 2019-11-21 ENCOUNTER — Inpatient Hospital Stay (HOSPITAL_COMMUNITY): Payer: Medicare Other | Admitting: Certified Registered"

## 2019-11-21 ENCOUNTER — Encounter (HOSPITAL_COMMUNITY)
Admission: AD | Disposition: A | Discharge status: Rehabilitation Facility | Payer: Self-pay | Source: Other Acute Inpatient Hospital | Attending: Neurological Surgery

## 2019-11-21 ENCOUNTER — Encounter (HOSPITAL_COMMUNITY): Payer: Self-pay | Admitting: Neurological Surgery

## 2019-11-21 ENCOUNTER — Inpatient Hospital Stay (HOSPITAL_COMMUNITY): Payer: Medicare Other

## 2019-11-21 DIAGNOSIS — I6521 Occlusion and stenosis of right carotid artery: Secondary | ICD-10-CM | POA: Diagnosis present

## 2019-11-21 HISTORY — PX: IR CT HEAD LTD: IMG2386

## 2019-11-21 HISTORY — PX: RADIOLOGY WITH ANESTHESIA: SHX6223

## 2019-11-21 HISTORY — PX: IR INTRAVSC STENT CERV CAROTID W/O EMB-PROT MOD SED INC ANGIO: IMG2304

## 2019-11-21 LAB — HEPARIN LEVEL (UNFRACTIONATED): Heparin Unfractionated: 0.11 IU/mL — ABNORMAL LOW (ref 0.30–0.70)

## 2019-11-21 LAB — CBC
HCT: 44.4 % (ref 39.0–52.0)
Hemoglobin: 15.2 g/dL (ref 13.0–17.0)
MCH: 32.6 pg (ref 26.0–34.0)
MCHC: 34.2 g/dL (ref 30.0–36.0)
MCV: 95.3 fL (ref 80.0–100.0)
Platelets: 142 10*3/uL — ABNORMAL LOW (ref 150–400)
RBC: 4.66 MIL/uL (ref 4.22–5.81)
RDW: 13.8 % (ref 11.5–15.5)
WBC: 11.2 10*3/uL — ABNORMAL HIGH (ref 4.0–10.5)
nRBC: 0 % (ref 0.0–0.2)

## 2019-11-21 LAB — POCT ACTIVATED CLOTTING TIME
Activated Clotting Time: 169 seconds
Activated Clotting Time: 136 seconds

## 2019-11-21 LAB — GLUCOSE, CAPILLARY
Glucose-Capillary: 117 mg/dL — ABNORMAL HIGH (ref 70–99)
Glucose-Capillary: 112 mg/dL — ABNORMAL HIGH (ref 70–99)
Glucose-Capillary: 113 mg/dL — ABNORMAL HIGH (ref 70–99)
Glucose-Capillary: 129 mg/dL — ABNORMAL HIGH (ref 70–99)

## 2019-11-21 LAB — BASIC METABOLIC PANEL
Anion gap: 8 (ref 5–15)
BUN: 32 mg/dL — ABNORMAL HIGH (ref 8–23)
CO2: 24 mmol/L (ref 22–32)
Calcium: 9 mg/dL (ref 8.9–10.3)
Chloride: 106 mmol/L (ref 98–111)
Creatinine, Ser: 1.27 mg/dL — ABNORMAL HIGH (ref 0.61–1.24)
GFR calc Af Amer: >60 mL/min (ref >60)
GFR calc non Af Amer: 55 mL/min — ABNORMAL LOW (ref >60)
Glucose, Bld: 120 mg/dL — ABNORMAL HIGH (ref 70–99)
Potassium: 4.7 mmol/L (ref 3.5–5.1)
Sodium: 138 mmol/L (ref 135–145)

## 2019-11-21 LAB — PLATELET INHIBITION P2Y12: Platelet Function  P2Y12: 87 [PRU] — ABNORMAL LOW (ref 182–335)

## 2019-11-21 IMAGING — XA IR INTRAVSC STENT CERV CAROTID W/O EMB-PROT
1 series · 11 of 24 positions shown · IV contrast (IODINE)
Comparison: Diagnostic arteriogram [DATE].

CLINICAL DATA: Left-sided hemiplegia with right gaze deviation due
to right middle cerebral artery stroke. High-grade stenosis of the
proximal right ICA confirmed on recent diagnostic arteriogram.

EXAM:
INTRAVSC STENT CERV CAROTID W/O EMB-PROT
TECHNIQUE: Informed written consent was obtained from the patient after a
thorough discussion of the procedural risks, benefits and
alternatives. All questions were addressed. Maximal Sterile Barrier
Technique was utilized including caps, mask, sterile gowns, sterile
gloves, sterile drape, hand hygiene and skin antiseptic. A timeout
was performed prior to the initiation of the procedure.

[Series 300: dr. (person_name) · 11 of 171 slices shown]
[im 8/171]
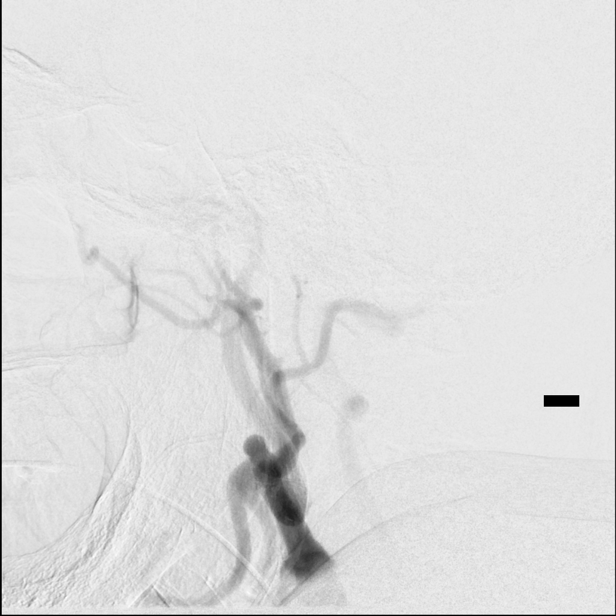
[im 23/171]
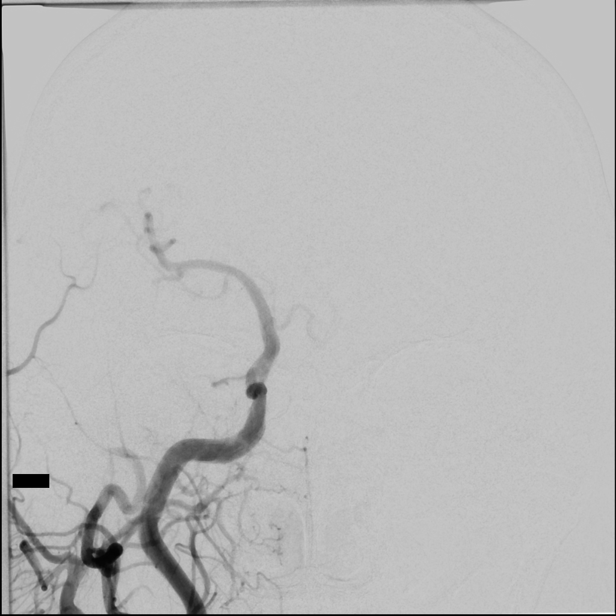
[im 37/171]
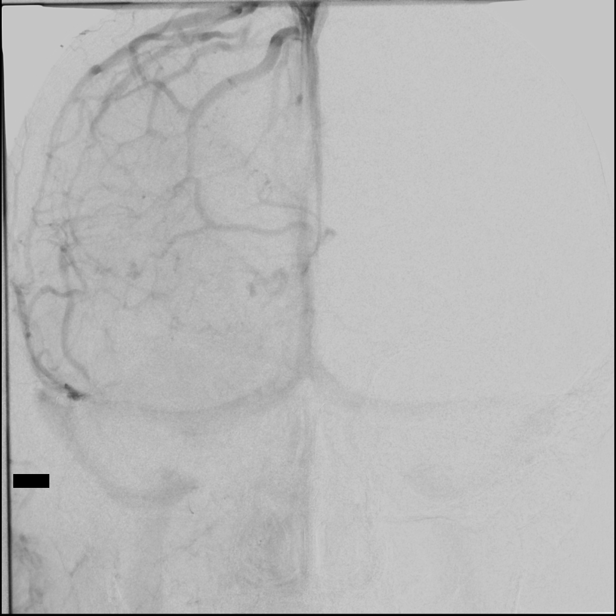
[im 52/171]
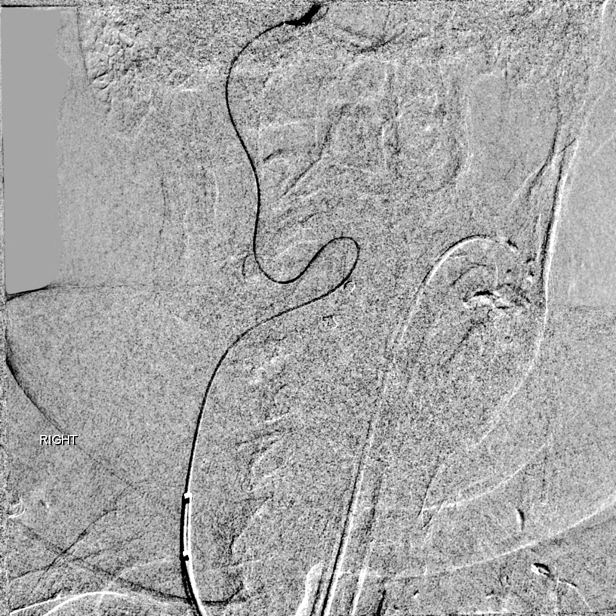
[im 67/171]
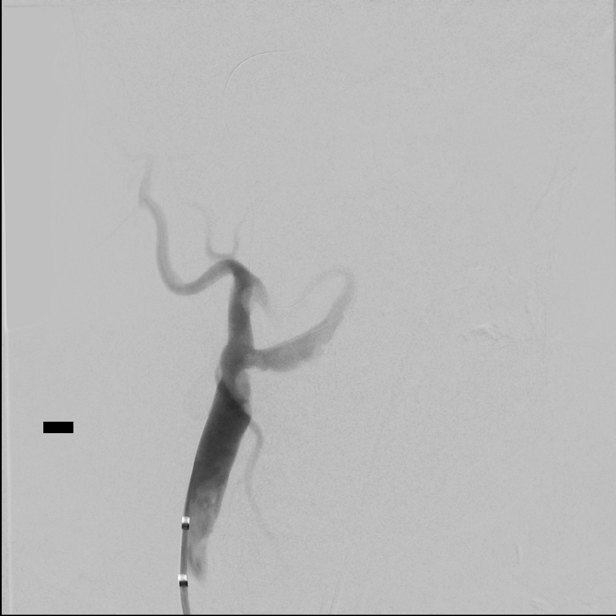
[im 89/171]
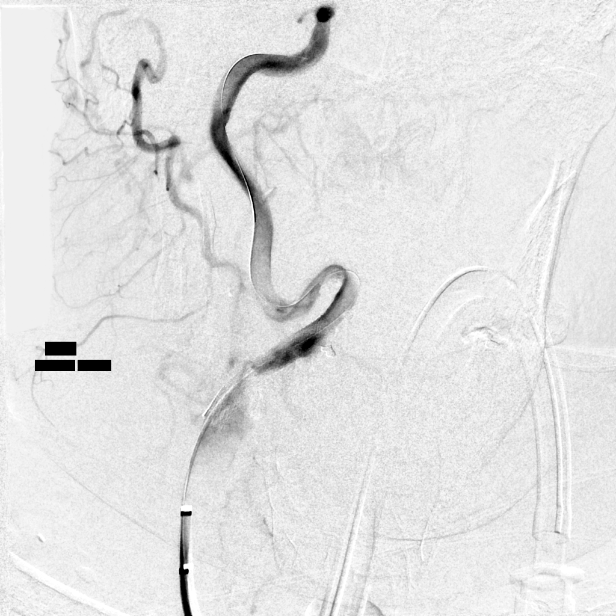
[im 104/171]
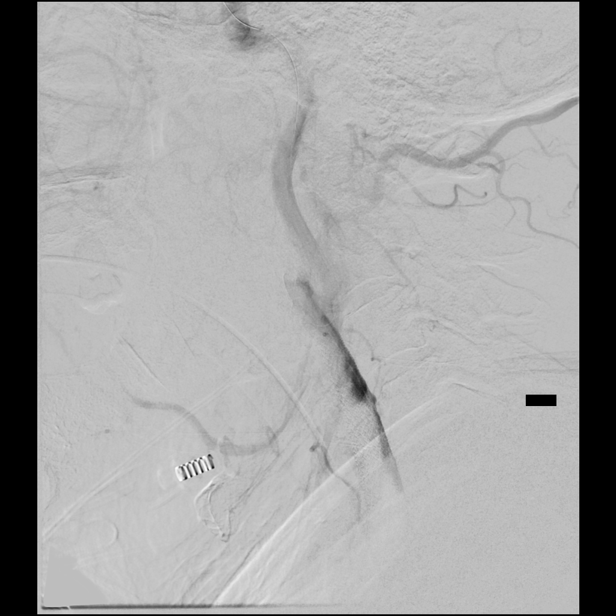
[im 119/171]
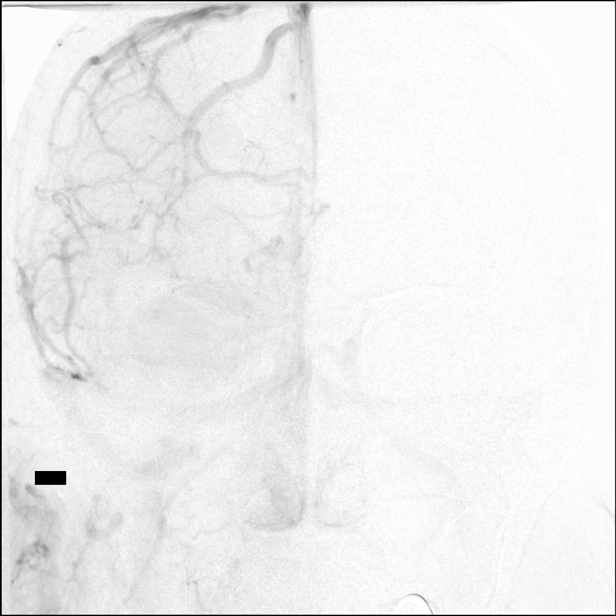
[im 134/171]
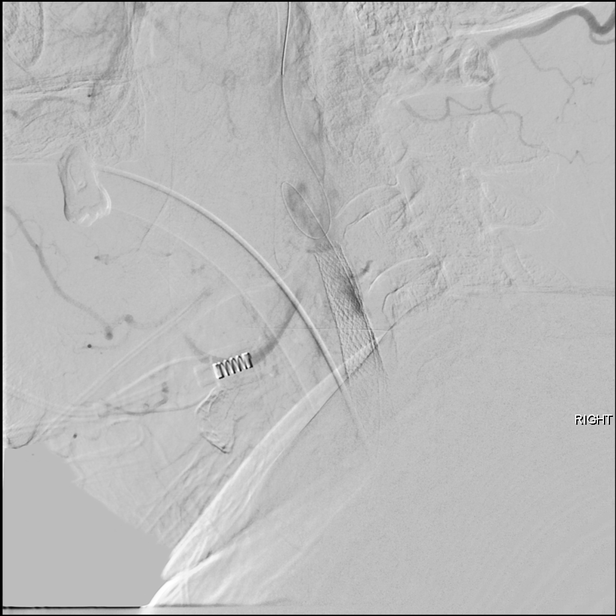
[im 148/171]
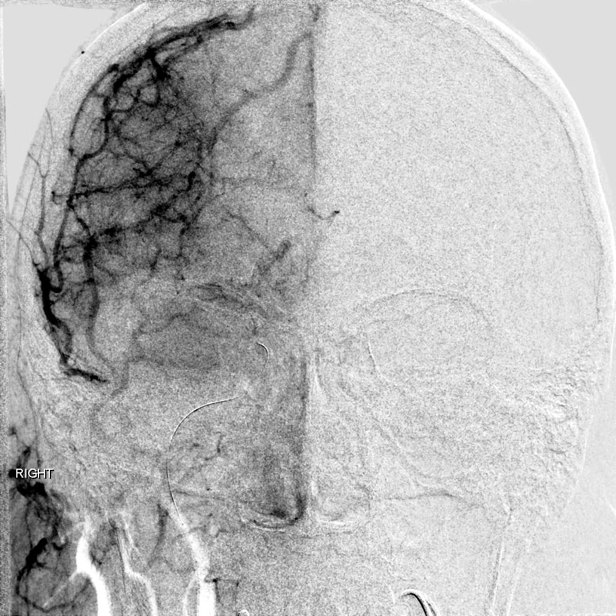
[im 163/171]
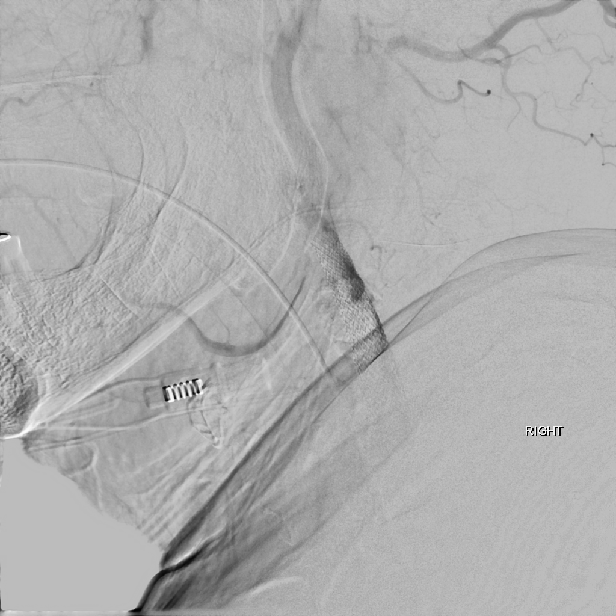

[11 of 24 positions shown; findings below may reference images not displayed]

MEDICATIONS:
Heparin 3,000 units IV. Ancef 2 g IV antibiotic was administered
within 1 hour of the procedure.

ANESTHESIA/SEDATION:
General anesthesia

CONTRAST:  Isovue 300 approximately 65 mL

FLUOROSCOPY TIME:  Fluoroscopy Time: 20 minutes 24 seconds ([2E]
mGy).

COMPLICATIONS:
None immediate.
The right groin was prepped and draped in the usual sterile fashion.
Thereafter using modified Seldinger technique, transfemoral access
into the right common femoral artery was obtained without
difficulty. Over a 0.035 inch guidewire, a 8 French Pinnacle sheath
was inserted. Through this, and also over 0.035 inch guidewire, a 5
French JB 1 catheter was advanced to the aortic arch region and
selectively positioned in the right common carotid artery.
FINDINGS: The right common carotid arteriogram again demonstrates patency of
the right external carotid artery with mild stenosis.

Severe high-grade stenosis of the right internal carotid artery
proximally secondary to a partially calcified large plaque. The
proximal right internal carotid artery demonstrates a U-shaped
configuration without evidence of kinking.

More distally the vessel is seen to opacify to the cranial skull
base. The petrous, the cavernous and the supraclinoid segments of
the right ICA are widely patent.

The right middle cerebral artery and the hypoplastic right anterior
cerebral artery opacify into the capillary and venous phases.
Mass-effect from the right-sided intracranial tumor is again noted
on the supraclinoid right ICA, and right middle cerebral artery,
without any intraluminal filling defects.

ENDOVASCULAR REVASCULARIZATION OF SYMPTOMATIC HIGH-GRADE RIGHT ICA
STENOSIS

The diagnostic JB 1 catheter in the right common carotid artery was
then exchanged over a 0.035 inch Rosen exchange guidewire for an 087
balloon guide catheter which was advanced to just proximal to the
right common carotid bifurcation. The guidewire was removed. Good
aspiration obtained from the hub of the balloon guide catheter.

Over a 0.014 inch standard Synchro micro guidewire, with a J
configuration, an 021 Trevo ProVue microcatheter was advanced to the
right common carotid bifurcation proximal to the right internal
carotid artery.

Using a torque device, the micro guidewire was then gently advanced
without any difficulty to the horizontal petrous segment followed by
the microcatheter. The guidewire was removed. Good aspiration
obtained from the hub of the microcatheter. This in turn was then
replaced with a 300 cm 014 inch standard Synchro exchange micro
guidewire with a J-tip configuration.

Measurements were then performed of the right internal carotid
artery in its proximal [DATE] normal segment, and in the distal right
common carotid artery normal segment.

It was elected to proceed with placement of a 6/8 mm x 40 mm Xact
stent with proximal flow arrest.

The stent was prepped and purged retrogradely in the usual manner.

Using the rapid exchange technique, the stent with its delivery
system was advanced to just proximal to the right internal carotid
artery.

Proximal flow arrest was then initiated by inflating the balloon in
the distal right common carotid artery. The stent was then advanced
and positioned distally and proximally to provide optimal coverage.
With proximal flow arrest and aspiration being applied at the hub of
the balloon guide catheter with an aspiration device, the stent was
then deployed in the usual manner without any difficulty. The
delivery mechanism was retrieved and removed following reversal of
the flow arrest.

A control arteriogram performed through the balloon guide in the
right common carotid artery demonstrated now significantly improved
caliber and flow through the stented segment and intracranially.

The proximal portion of the stent was noted to be incompletely
apposed in the right common carotid artery distally. This
necessitated the use of a second Xact stent this time an 8/10 mm x
40 mm dimension. Again this was prepped and purged in the usual
manner and then advanced again using the rapid exchange technique.
This stent was deployed with the distal portion of the stent
telescoping into the mid segment of the first stent. The delivery
mechanism was retrieved and removed. A control arteriogram performed
through the balloon guide catheter in the right common carotid now
demonstrated excellent apposition of the stents proximally and
distally with significantly improved caliber of the right internal
carotid proximally.

A control arteriogram performed intracranially demonstrated wide
patency of the right middle cerebral artery and the right anterior
cerebral arteries with no change in the homogeneous opacification of
the large tumor.

Control arteriograms were then performed at 15 and 30 minutes post
deployment of the 2 stents. These continued to demonstrate no
significant intraluminal filling defects, or changes intracranially.

A final control arteriogram was performed following removal of the
exchange guidewire. There continued to be excellent flow through the
right internal carotid artery proximally and distally with no
evidence of intraluminal filling defects or occlusions.

Throughout the procedure, the patient's hemodynamic status remained
stable.

The 8 French balloon guide was then retrieved and removed. Manual
compression was applied at the right groin puncture site due to
significant atherosclerotic disease of the common femoral artery.

Following hemostasis, the groin appeared soft. Distal pulses
remained Dopplerable in the dorsalis pedis, and the posterior tibial
regions bilaterally unchanged.

A CT of the brain performed following the procedure demonstrated no
change in the mass effect secondary to the tumor or hemorrhagic
complication within the cerebral tissue or within the tumor itself.

Patient's general anesthesia was then reversed. Patient gradually
recovered and was able to FORSBOM simple commands appropriately. The
patient moved his right arm and leg spontaneously. There was no left
sided weakness or the right gaze deviation noted prior to the
procedure.

The patient did not complain of any headaches, nausea or vomiting.
He was transferred to the floor for post revascularization
management.
IMPRESSION: Status post endovascular revascularization of symptomatic high-grade
stenosis of the proximal right internal carotid artery with
placement of 2 telescoping stents, and proximal flow arrest as
described.

PLAN:
Follow-up in the clinic 4 to 6 weeks post discharge.

## 2019-11-21 SURGERY — MRI WITH ANESTHESIA
Anesthesia: General | Laterality: Right

## 2019-11-21 MED ORDER — CEFAZOLIN SODIUM-DEXTROSE 2-3 GM-%(50ML) IV SOLR
INTRAVENOUS | Status: DC | PRN
  Administered 2019-11-21: 2 g via INTRAVENOUS

## 2019-11-21 MED ORDER — NITROGLYCERIN 1 MG/10 ML FOR IR/CATH LAB
INTRA_ARTERIAL | Status: AC
  Filled 2019-11-21: qty 10

## 2019-11-21 MED ORDER — CLEVIDIPINE BUTYRATE 0.5 MG/ML IV EMUL
INTRAVENOUS | Status: AC
  Filled 2019-11-21: qty 50

## 2019-11-21 MED ORDER — CHLORHEXIDINE GLUCONATE 0.12 % MT SOLN
15.0000 mL | OROMUCOSAL | Status: AC
  Administered 2019-11-21: 15 mL via OROMUCOSAL
  Filled 2019-11-21: qty 15

## 2019-11-21 MED ORDER — ACETAMINOPHEN 325 MG PO TABS: 650.0000 mg | ORAL_TABLET | ORAL | Status: DC | PRN

## 2019-11-21 MED ORDER — ASPIRIN 81 MG PO CHEW
81.0000 mg | CHEWABLE_TABLET | Freq: Every day | ORAL | Status: DC
  Administered 2019-11-22 – 2019-11-28 (×7): 81 mg via ORAL
  Filled 2019-11-21 (×7): qty 1

## 2019-11-21 MED ORDER — HEPARIN SODIUM (PORCINE) 1000 UNIT/ML IJ SOLN
INTRAMUSCULAR | Status: DC | PRN
  Administered 2019-11-21: 3000 [IU] via INTRAVENOUS
  Administered 2019-11-21: 500 [IU] via INTRAVENOUS

## 2019-11-21 MED ORDER — ACETAMINOPHEN 650 MG RE SUPP: 650.0000 mg | RECTAL | Status: DC | PRN

## 2019-11-21 MED ORDER — HEPARIN (PORCINE) 25000 UT/250ML-% IV SOLN: 550.0000 [IU]/h | INTRAVENOUS | Status: DC

## 2019-11-21 MED ORDER — CLEVIDIPINE BUTYRATE 0.5 MG/ML IV EMUL
INTRAVENOUS | Status: DC | PRN
  Administered 2019-11-21: 3 mg/h via INTRAVENOUS

## 2019-11-21 MED ORDER — HEPARIN (PORCINE) 25000 UT/250ML-% IV SOLN: 500.0000 [IU]/h | INTRAVENOUS | Status: DC

## 2019-11-21 MED ORDER — CEFAZOLIN SODIUM-DEXTROSE 2-4 GM/100ML-% IV SOLN
INTRAVENOUS | Status: AC
  Filled 2019-11-21: qty 100

## 2019-11-21 MED ORDER — OXYCODONE HCL 5 MG/5ML PO SOLN: 5.0000 mg | Freq: Once | ORAL | Status: DC | PRN

## 2019-11-21 MED ORDER — SODIUM CHLORIDE 0.9 % IV SOLN: INTRAVENOUS | Status: DC

## 2019-11-21 MED ORDER — LIDOCAINE 2% (20 MG/ML) 5 ML SYRINGE
INTRAMUSCULAR | Status: DC | PRN
  Administered 2019-11-21: 50 mg via INTRAVENOUS

## 2019-11-21 MED ORDER — EPHEDRINE SULFATE 50 MG/ML IJ SOLN
INTRAMUSCULAR | Status: DC | PRN
  Administered 2019-11-21: 10 mg via INTRAVENOUS

## 2019-11-21 MED ORDER — DEXAMETHASONE SODIUM PHOSPHATE 10 MG/ML IJ SOLN
INTRAMUSCULAR | Status: DC | PRN
  Administered 2019-11-21: 5 mg via INTRAVENOUS

## 2019-11-21 MED ORDER — PROPOFOL 10 MG/ML IV BOLUS
INTRAVENOUS | Status: DC | PRN
  Administered 2019-11-21: 50 mg via INTRAVENOUS
  Administered 2019-11-21: 150 mg via INTRAVENOUS
  Administered 2019-11-21: 50 mg via INTRAVENOUS
  Administered 2019-11-21: 20 mg via INTRAVENOUS

## 2019-11-21 MED ORDER — ACETAMINOPHEN 160 MG/5ML PO SOLN: 650.0000 mg | ORAL | Status: DC | PRN

## 2019-11-21 MED ORDER — HEPARIN (PORCINE) 25000 UT/250ML-% IV SOLN
INTRAVENOUS | Status: AC
  Filled 2019-11-21: qty 250

## 2019-11-21 MED ORDER — CLEVIDIPINE BUTYRATE 0.5 MG/ML IV EMUL
0.0000 mg/h | INTRAVENOUS | Status: AC
  Administered 2019-11-21: 6 mg/h via INTRAVENOUS
  Administered 2019-11-22: 3 mg/h via INTRAVENOUS
  Filled 2019-11-21 (×2): qty 50

## 2019-11-21 MED ORDER — ONDANSETRON HCL 4 MG/2ML IJ SOLN: 4.0000 mg | Freq: Four times a day (QID) | INTRAMUSCULAR | Status: DC | PRN

## 2019-11-21 MED ORDER — ROCURONIUM BROMIDE 10 MG/ML (PF) SYRINGE
PREFILLED_SYRINGE | INTRAVENOUS | Status: DC | PRN
  Administered 2019-11-21: 20 mg via INTRAVENOUS
  Administered 2019-11-21: 50 mg via INTRAVENOUS

## 2019-11-21 MED ORDER — FENTANYL CITRATE (PF) 100 MCG/2ML IJ SOLN: 25.0000 ug | INTRAMUSCULAR | Status: DC | PRN

## 2019-11-21 MED ORDER — OXYCODONE HCL 5 MG PO TABS: 5.0000 mg | ORAL_TABLET | Freq: Once | ORAL | Status: DC | PRN

## 2019-11-21 MED ORDER — TICAGRELOR 90 MG PO TABS
90.0000 mg | ORAL_TABLET | Freq: Two times a day (BID) | ORAL | Status: DC
  Filled 2019-11-21 (×3): qty 1

## 2019-11-21 MED ORDER — TICAGRELOR 90 MG PO TABS
90.0000 mg | ORAL_TABLET | Freq: Two times a day (BID) | ORAL | Status: DC
  Administered 2019-11-22 – 2019-11-28 (×13): 90 mg via ORAL
  Filled 2019-11-21 (×14): qty 1

## 2019-11-21 MED ORDER — ESMOLOL HCL 100 MG/10ML IV SOLN
INTRAVENOUS | Status: DC | PRN
  Administered 2019-11-21: 30 mg via INTRAVENOUS

## 2019-11-21 MED ORDER — SUGAMMADEX SODIUM 200 MG/2ML IV SOLN
INTRAVENOUS | Status: DC | PRN
  Administered 2019-11-21: 200 mg via INTRAVENOUS

## 2019-11-21 MED ORDER — ASPIRIN 81 MG PO CHEW
81.0000 mg | CHEWABLE_TABLET | Freq: Every day | ORAL | Status: DC
  Filled 2019-11-21 (×2): qty 1

## 2019-11-21 MED ORDER — HEPARIN (PORCINE) 25000 UT/250ML-% IV SOLN
500.0000 [IU]/h | INTRAVENOUS | Status: DC
  Administered 2019-11-21: 500 [IU]/h via INTRAVENOUS

## 2019-11-21 MED ORDER — IOHEXOL 300 MG/ML  SOLN
150.0000 mL | Freq: Once | INTRAMUSCULAR | Status: AC | PRN
  Administered 2019-11-21: 75 mL via INTRA_ARTERIAL

## 2019-11-21 MED ORDER — PHENYLEPHRINE HCL-NACL 10-0.9 MG/250ML-% IV SOLN
INTRAVENOUS | Status: DC | PRN
  Administered 2019-11-21: 20 ug/min via INTRAVENOUS

## 2019-11-21 MED ORDER — FENTANYL CITRATE (PF) 100 MCG/2ML IJ SOLN
INTRAMUSCULAR | Status: DC | PRN
Start: 2019-11-21 — End: 2019-11-21
  Administered 2019-11-21: 50 ug via INTRAVENOUS

## 2019-11-21 MED ORDER — ONDANSETRON HCL 4 MG/2ML IJ SOLN
INTRAMUSCULAR | Status: DC | PRN
  Administered 2019-11-21: 4 mg via INTRAVENOUS

## 2019-11-21 NOTE — Progress Notes (Signed)
SLP Cancellation Note  Patient Details Name: Charles Sherman MRN: [id number] DOB: 1945/07/22   Cancelled treatment:       Reason Eval/Treat Not Completed: Patient at procedure or test/unavailable. Pt in  Procedure today, will f/u tomorrow.    Laparis Durrett, Katherene Ponto 11/21/2019, 7:39 AM

## 2019-11-21 NOTE — Progress Notes (Signed)
STROKE TEAM PROGRESS NOTE   INTERVAL HISTORY Patient had elective right carotid telescoping stent placed by Dr. Estanislado Pandy earlier this morning.  Procedure went uneventfully.  Patient is presently ill lying in bed.  He has a cervical collar.  Vital signs stable.  Neurological exam is unchanged. Vitals:   11/21/19 1700 11/21/19 1705 11/21/19 1710 11/21/19 1749  BP: (!) 145/67 (!) 149/74 (!) 122/58   Pulse: (!) 56 (!) 57 62 61  Resp: (!) 0 17 15 17   Temp:    98.3 F (36.8 C)  TempSrc:      SpO2: 95% 95% 98% 95%  Weight:      Height:       CBC:  Recent Labs  Lab 11/20/19 1345 11/21/19 0527  WBC 10.9* 11.2*  HGB 14.9 15.2  HCT 45.3 44.4  MCV 95.8 95.3  PLT 177 503*   Basic Metabolic Panel:  Recent Labs  Lab 11/20/19 1345 11/21/19 0527  NA 139 138  K 4.6 4.7  CL 107 106  CO2 26 24  GLUCOSE 119* 120*  BUN 34* 32*  CREATININE 1.35* 1.27*  CALCIUM 9.1 9.0   Lipid Panel:  Recent Labs  Lab 11/15/19 1143  CHOL 189  TRIG 54  HDL 64  CHOLHDL 3.0  VLDL 11  LDLCALC 114*   HgbA1c:  Recent Labs  Lab 11/15/19 1143  HGBA1C 5.4   Urine Drug Screen: No results for input(s): LABOPIA, COCAINSCRNUR, LABBENZ, AMPHETMU, THCU, LABBARB in the last 168 hours.  Alcohol Level No results for input(s): ETH in the last 168 hours.  IMAGING past 24 hours No results found.  PHYSICAL EXAM  Temp:  [97.6 F (36.4 C)-98.5 F (36.9 C)] 98.3 F (36.8 C) (09/29 1749) Pulse Rate:  [53-81] 61 (09/29 1749) Resp:  [0-19] 17 (09/29 1749) BP: (99-149)/(52-93) 122/58 (09/29 1710) SpO2:  [90 %-99 %] 95 % (09/29 1749) Arterial Line BP: (138-190)/(53-75) 179/68 (09/29 1710) Weight:  [546.5 kg] 118.8 kg (09/29 1415)  General -obese elderly African-American male not in acute distress  Ophthalmologic - fundi not visualized due to noncooperation.  Cardiovascular - Regular rhythm and rate.  No murmur or gallop  Neuro - awake alert, eyes open, follow up simple commands. Oriented to age, time  and place.  No dysarthria no aphasia, able to name and repeat simple sentences. Right gaze preference, able to cross midline, left gaze paresis. Eyes conjugate, but limited upper and lower gaze. 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 Left able to count fingers, intact pupillary reflex. Left facial droop. Tongue midline. RUE 5/5, RLE 3+/5.   LUE 2/5 proximal  0/5 finger movement with increased muscle tone. LLE 3-/5 proximal and 3/5 distal DF. Sensation symmetrical per pt, right FTN intact. Gait not tested.   ASSESSMENT/PLAN Charles Sherman is a 74 y.o. male with history of COPD, CHF, HTN, MI, OSA and known meningioma (from 05/2019 with R CRAO, followed by NS w/ scheduled appt for 9/23) presenting to [facility] 9/22 with L facial droop, L sided weakness and dysarthria.   Stroke: Scattered R MCA and ACA infarcts in setting of right ICA bulb and siphon stenosis and right MCA narrowing due to large R meningioma. Can not rule out embolic source.  MRI  [facility]) 9/22 R MCA and ACA scattered infarcts. Elevation of R MCA d/t mass effect. R middle cranial fossa meningioma involving R cavernous sinus and sella turcica unchanged w/ 45mm L midline shift.  Vasogenic edema  on the right brain.  CTA head large R cavernous sinus meningioma extending into prepontine cistern on the R, mass enters the sella. Extensive atherosclerotic calcification B cavernous ICA and supraclinoid ICA. Distal B VA mild stenoses. Right middle cerebral artery is up lifted by tumor.  CT - 11/16/19 -  Acute infarct right MCA territory in the right frontal parietal lobe extending into the occipital lobe is unchanged. 9 mm midline shift to the left unchanged.  Carotid Doppler nondiagnostic of right ICA due to extensive plaque  CTA neck Bulky eccentric plaque at the right bifurcation/proximal right ICA with associated stenosis of up to 65-70%  2D Echo - EF 60 - 65%. Findings concerning for MV mass -  TEE recommended  for clarification. Will arrange for Monday. (order placed - message sent to cardiology - pt will be NPO Monday)  LDL 114  HgbA1c 5.4  VTE prophylaxis - lovenox   aspirin 81 mg daily prior to admission, now on aspirin 325 mg daily and plavix DAPT for 3 months and then plavix alone.   Therapy recommendations:  CIR recommended  Disposition:  pending   Meningioma, large  Large R cavernous area extending into prepontine cistern and sella w/ upward displacement of R MCA  Previously not a surgical candidate given R CRAO at time of dx  Currently not a surgical candidate given acute stroke  NS following for possible future removal  Vasogenic edema on Decadron IV  Carotid stenosis Hx of R CRAO  05/2019 right eye vision loss  02/2019 CUS Bilateral carotid atherosclerosis. No hemodynamically significant ICA stenosis.   This admission CUS nondiagnostic of right ICA due to extensive plaque  CTA neck Bulky eccentric plaque at the right bifurcation/proximal right ICA with associated stenosis of up to 65-70%  May consider right CEA in the near future. I have discussed with Dr. Estanislado Pandy and he will see pt   Vasogenic edema  CT and MRI showed right brain vasogenic edema due to tumor  On decadron IV  Put on CBG and SSI  Vomiting 9/23   CT - 11/16/19 - Large right cavernous sinus meningioma unchanged. There is vasogenic edema in the right temporoparietal lobe. 9 mm midline shift to the left unchanged.  Hypertension  Home meds:  HCTZ 25 tid, imdur 120, amlodipine 10, lasix 80 bid, metoprolol 25 bid  On amlodipine 10, metoprolol 25 bid, lasix 40 bid and imdur 120 daily  On hydralazine PRN  BP stable  BP goal < 180/105 - gradually decrease BP to the long term goal . Long-term BP goal 130-150 given ICA siphon and bulb stenosis  Hyperlipidemia  Home meds:  lipitor 40, resumed in hospital  LDL 114, goal < 70  Increase lipitor to 80  Continue statin at  discharge  Tobacco abuse  Current smoker  Smoking cessation counseling provided  Pt is willing to quit     Other Stroke Risk Factors  Advanced age  Obesity, recommend weight loss, diet and exercise as appropriate   Coronary artery disease, hx MI w/ PCI x 3  Obstructive sleep apnea, on CPAP at home  Chronic diastolic Congestive heart failure  Other Active Problems  Leukocytosis WBC 8.3->15.1->13.4->11.8  COPD  Bradycardia (40's) (on Lopressor 25 mg Bid home meds)   AKI - creatinine - 1.25 - encourage po intake - on IVF fluids 50 cc / hr ->1.43 -> will increase IV to 75 cc /hr (pt is on Zaroxolyn and Lasix - CHF hx - may need to decrease  dose if creatinine continues to climb)  Hospital day # 7 Plan  Continue close neurological monitoring and strict blood pressure control with systolic goal between 237-990 for 24 hours and then below 180.  Discussed with Dr. Estanislado Pandy and patient and answered questions. Continue aspirin and Plavix.  Continue Decadron and management of meningioma as per neurosurgery.    Greater than 50% time during this 35-minute visit was spent on counseling and coordination of care and about his stroke and meningioma and discussion with care team and answering questions Charles Contras, MD   To contact Stroke Continuity provider, please refer to [email or web address] After hours, contact General Neurology

## 2019-11-21 NOTE — Procedures (Signed)
S/P RT ICA revascularization with stent assisted angioplasty with prox flow arrest. Post procedure CT brain no ICH. Mass effect from the large calcified menigioma. Patient extubated. Denies any H/As N/V  Pupils 54mm RT = Lt sluggish. Moves RT arm and leg to command. Lt sided weakness unchanged. RT groin soft. Distal pulses dopplerable. D/W Dr Clydene Fake.Maudie Shingledecker MD

## 2019-11-21 NOTE — Progress Notes (Signed)
Patient ID: Charles Sherman, male   DOB: 1945-11-14, 74 y.o.   MRN: [id number] INR.  Patient scheduled for RT ICA revascularization with GA. Clinically remains stable neurologically with no change in Lt hemiplegia and RT facial droop and Rt gaze preference.. Denies any H/As,N/V.  Is alert oriented x3.  Poor vision in the RT eye probably related to the large  Rt sided meningioma. Patient is fully aware of the procedure including risks of ICH from reperfusion and within the meningioma with potential for   worsening neuro function,death ,inability to revascularize and new ischemic stroke. Patient expressed understanding and has consented to the procedure. D/W sister also as per patients wishes. S.Korinna Tat MD,

## 2019-11-21 NOTE — Progress Notes (Signed)
PT Cancellation Note  Patient Details Name: Charles Sherman MRN: [id number] DOB: Mar 24, 1945   Cancelled Treatment:    Reason Eval/Treat Not Completed: Patient at procedure or test/unavailable. Pt off floor for R carotid angioplasty stenting. PT to return as able, as appropriate to progress mobility.  Kittie Plater, PT, DPT Acute Rehabilitation Services Pager #: [phone] Office #: [phone]    Berline Lopes 11/21/2019, 7:51 AM

## 2019-11-21 NOTE — Progress Notes (Signed)
Patient ID: Charles Sherman, male   DOB: [birth year], 74 y.o.   MRN: [id number] Patient had angia for stenting today. I had contacted his wife to explain procedure to her. She is happy with plan. Wil follow up w patient in am.

## 2019-11-21 NOTE — Progress Notes (Signed)
Patient arrived from PACU. Right groin site ozzing through quik clot and gauze pad again. IR team notified and came to bedside. Pressure held for 20 more minutes and new quik clot pad and pressure dressing placed. Order for 10lb sand bag.

## 2019-11-21 NOTE — Progress Notes (Signed)
ANTICOAGULATION CONSULT NOTE - Initial Consult  Pharmacy Consult for Heparin Indication: s/p R-ICA stenting by IR  Allergies  Allergen Reactions  . Shrimp [Shellfish Allergy]     Patient Measurements: Height: 5\' 11"  (180.3 cm) Weight: 118.8 kg (262 lb) IBW/kg (Calculated) : 75.3  Heparin Dosing Weight: 101 kg  Vital Signs: Temp: 97.9 F (36.6 C) (09/29 1415) Temp Source: Oral (09/29 0338) BP: 126/71 (09/29 1415) Pulse Rate: 62 (09/29 1415)  Labs: Recent Labs    11/19/19 0550 11/19/19 0550 11/20/19 1345 11/21/19 0527  HGB 15.0   < > 14.9 15.2  HCT 45.2  --  45.3 44.4  PLT 169  --  177 142*  CREATININE 1.44*  --  1.35* 1.27*   < > = values in this interval not displayed.    Estimated Creatinine Clearance: 64.9 mL/min (A) (by C-G formula based on SCr of 1.27 mg/dL (H)).   Medical History: Past Medical History:  Diagnosis Date  . CHF (congestive heart failure) ([place])   . COPD (chronic obstructive pulmonary disease) ([place])   . HTN (hypertension)   . Myocardial infarct ([place])   . OSA (obstructive sleep apnea)    on CPAP    Assessment: 82 YOM who underwent scheduled right ICA revascularization with stent with IR today. Heparin was started post-IR for anticoagulation.   Heparin started at 500 units/hr around 1200 today. Dose likely slightly low for the patient's weight - but will wait to check a level around 1800 to determine dose adjustments.   Goal of Therapy:  Heparin level 0.1-0.25 units/ml Monitor platelets by anticoagulation protocol: Yes   Plan:  - D/c Enox for VTE prophylaxis while on Heparin - Continue Heparin at 500 units/hr - Will continue to monitor for any signs/symptoms of bleeding and will follow up with heparin level in 6 hours after starting  Thank you for allowing pharmacy to be a part of this patient's care.  Alycia Rossetti, PharmD, BCPS Clinical Pharmacist Clinical phone for 11/21/2019: [phone] 11/21/2019 2:53 PM   **Pharmacist phone  directory can now be found on [email or web address] ([email or web address]).  Listed under [place].

## 2019-11-21 NOTE — Progress Notes (Signed)
Small amt of bloody drainage noted and documented on sheath site dressing at 1415. At 1430 drainage site no change in status. No hematoma noted. At 1445 during transport patient moved gown and exposed self in hallway. Upon placing blankets large amt of bloody drainage noted coming from sheath site. Pressure applied and patient returned to PACU. IR and Dr Estanislado Pandy notified of status. Orders received and implemented. At 1515 IR tech at bedside. During event patient vital signs remained stable alert and oriented with no c/o pain od discomfort.

## 2019-11-21 NOTE — Progress Notes (Signed)
1830 spoke with Dr. Estanislado Pandy regarding restarting heparin gtt. Orders to restart at 1930 if still not bleeding from right groin site.

## 2019-11-21 NOTE — Anesthesia Procedure Notes (Signed)
Arterial Line Insertion Start/End9/29/2021 8:50 AM, 11/21/2019 9:00 AM Performed by: Imagene Riches, CRNA, CRNA  Preanesthetic checklist: patient identified, IV checked, site marked, risks and benefits discussed, surgical consent, monitors and equipment checked, pre-op evaluation, timeout performed and anesthesia consent Right, radial was placed Catheter size: 20 G Hand hygiene performed  and maximum sterile barriers used  Allen's test indicative of satisfactory collateral circulation Attempts: 1 Procedure performed without using ultrasound guided technique. Following insertion, dressing applied and Biopatch. Post procedure assessment: normal  Patient tolerated the procedure well with no immediate complications.

## 2019-11-21 NOTE — Anesthesia Procedure Notes (Signed)
Procedure Name: Intubation Date/Time: 11/21/2019 9:34 AM Performed by: Imagene Riches, CRNA Pre-anesthesia Checklist: Patient identified, Emergency Drugs available, Suction available and Patient being monitored Patient Re-evaluated:Patient Re-evaluated prior to induction Oxygen Delivery Method: Circle System Utilized Preoxygenation: Pre-oxygenation with 100% oxygen Induction Type: IV induction Ventilation: Oral airway inserted - appropriate to patient size and Two handed mask ventilation required Laryngoscope Size: Miller and 3 Grade View: Grade I Tube type: Oral Number of attempts: 1 Airway Equipment and Method: Stylet and Oral airway Placement Confirmation: ETT inserted through vocal cords under direct vision,  positive ETCO2 and breath sounds checked- equal and bilateral Tube secured with: Tape Dental Injury: Teeth and Oropharynx as per pre-operative assessment

## 2019-11-21 NOTE — Transfer of Care (Signed)
Immediate Anesthesia Transfer of Care Note  Patient: Charles Sherman  Procedure(s) Performed: RIGHT CAROTID STENTING (Right )  Patient Location: PACU  Anesthesia Type:General  Level of Consciousness: drowsy  Airway & Oxygen Therapy: Patient Spontanous Breathing and Patient connected to nasal cannula oxygen  Post-op Assessment: Report given to RN and Post -op Vital signs reviewed and stable  Post vital signs: Reviewed and stable  Last Vitals:  Vitals Value Taken Time  BP 107/61 11/21/19 1145  Temp    Pulse 77 11/21/19 1149  Resp 16 11/21/19 1149  SpO2 99 % 11/21/19 1149  Vitals shown include unvalidated device data.  Last Pain:  Vitals:   11/21/19 0338  TempSrc: Oral  PainSc:          Complications: No complications documented.

## 2019-11-21 NOTE — Progress Notes (Signed)
ANTICOAGULATION CONSULT NOTE   Pharmacy Consult for Heparin Indication: s/p R-ICA stenting by IR  Allergies  Allergen Reactions  . Shrimp [Shellfish Allergy]     Patient Measurements: Height: 5\' 11"  (180.3 cm) Weight: 118.8 kg (262 lb) IBW/kg (Calculated) : 75.3  Heparin Dosing Weight: 101 kg  Vital Signs: Temp: 98.3 F (36.8 C) (09/29 1749) Temp Source: Oral (09/29 1600) BP: 133/68 (09/29 1900) Pulse Rate: 55 (09/29 1900)  Labs: Recent Labs    11/19/19 0550 11/19/19 0550 11/20/19 1345 11/21/19 0527 11/21/19 1839  HGB 15.0   < > 14.9 15.2  --   HCT 45.2  --  45.3 44.4  --   PLT 169  --  177 142*  --   HEPARINUNFRC  --   --   --   --  0.11*  CREATININE 1.44*  --  1.35* 1.27*  --    < > = values in this interval not displayed.    Estimated Creatinine Clearance: 64.9 mL/min (A) (by C-G formula based on SCr of 1.27 mg/dL (H)).  Assessment: 48 YOM who underwent scheduled right ICA revascularization with stent with IR today. Heparin was started post-IR for anticoagulation.   Heparin started at 500 units/hr around 1200 today. Dose likely slightly low for the patient's weight - but will wait to check a level around 1800 to determine dose adjustments.   Heparin level at 1800 pm is 0.11, but heparin stopped at 1500 pm for bleeding at groin site   Goal of Therapy:  Heparin level 0.1-0.25 units/ml Monitor platelets by anticoagulation protocol: Yes   Plan:  Heparin to resume at 500 units / hr at 1930 pm Check a heparin level 8 hours after heparin resumed.  Thank you for allowing pharmacy to be a part of this patient's care. Anette Guarneri, PharmD 11/21/2019 7:20 PM   **Pharmacist phone directory can now be found on [email or web address] ([email or web address]).  Listed under [place].

## 2019-11-21 NOTE — Progress Notes (Addendum)
Interventional Radiology Brief Note:  PA to bedside to assess after procedure this AM.  Conversant but sleepy.  Denies pain, new headache or changes to vision. R pupil sluggish, stable. R groin puncture site with oozing, slow, likely from tract. No hematoma.  Non-tender.  Distal pulses intact.  Pressure has already been held in PACU for 30 minutes.  New quik clot applied.  10 lbs sand bag ordered.  Patient instructed to keep leg flat.   A-line does not appear to be tracking accurately.   Follow cuff pressures with goal SBP 120-140.  Not currently requiring cleviprex.  IR following.   Brynda Greathouse, MS RD PA-C

## 2019-11-21 NOTE — Progress Notes (Signed)
Orthopedic Tech Progress Note Patient Details:  Charles Sherman 1945/08/12 [id number] RN called requesting a 10LB SAND BAG for patient. Handed it to RN. Patient ID: Charles Sherman, male   DOB: 1945-05-01, 74 y.o.   MRN: [id number]   Janit Pagan 11/21/2019, 6:03 PM

## 2019-11-21 NOTE — Anesthesia Preprocedure Evaluation (Signed)
Anesthesia Evaluation  Patient identified by MRN, date of birth, ID band Patient awake    Reviewed: Allergy & Precautions, H&P , NPO status , Patient's Chart, lab work & pertinent test results  Airway Mallampati: II   Neck ROM: full    Dental   Pulmonary sleep apnea , COPD, Current Smoker,    breath sounds clear to auscultation       Cardiovascular hypertension, + Peripheral Vascular Disease and +CHF   Rhythm:regular Rate:Normal     Neuro/Psych CVA    GI/Hepatic   Endo/Other    Renal/GU Renal InsufficiencyRenal disease     Musculoskeletal   Abdominal   Peds  Hematology   Anesthesia Other Findings   Reproductive/Obstetrics                             Anesthesia Physical Anesthesia Plan  ASA: III  Anesthesia Plan: General   Post-op Pain Management:    Induction: Intravenous  PONV Risk Score and Plan: 1 and Ondansetron, Dexamethasone, Midazolam and Treatment may vary due to age or medical condition  Airway Management Planned: Oral ETT  Additional Equipment: Arterial line  Intra-op Plan:   Post-operative Plan: Extubation in OR  Informed Consent: I have reviewed the patients History and Physical, chart, labs and discussed the procedure including the risks, benefits and alternatives for the proposed anesthesia with the patient or authorized representative who has indicated his/her understanding and acceptance.       Plan Discussed with: Anesthesiologist, CRNA and Surgeon  Anesthesia Plan Comments:         Anesthesia Quick Evaluation

## 2019-11-22 ENCOUNTER — Inpatient Hospital Stay (HOSPITAL_COMMUNITY): Payer: Medicare Other

## 2019-11-22 ENCOUNTER — Encounter (HOSPITAL_COMMUNITY): Payer: Self-pay | Admitting: Interventional Radiology

## 2019-11-22 LAB — CBC WITH DIFFERENTIAL/PLATELET
Abs Immature Granulocytes: 0.09 10*3/uL — ABNORMAL HIGH (ref 0.00–0.07)
Basophils Absolute: 0 10*3/uL (ref 0.0–0.1)
Basophils Relative: 0 %
Eosinophils Absolute: 0 10*3/uL (ref 0.0–0.5)
Eosinophils Relative: 0 %
HCT: 40.2 % (ref 39.0–52.0)
Hemoglobin: 13.8 g/dL (ref 13.0–17.0)
Immature Granulocytes: 1 %
Lymphocytes Relative: 7 %
Lymphs Abs: 0.9 10*3/uL (ref 0.7–4.0)
MCH: 32.4 pg (ref 26.0–34.0)
MCHC: 34.3 g/dL (ref 30.0–36.0)
MCV: 94.4 fL (ref 80.0–100.0)
Monocytes Absolute: 0.8 10*3/uL (ref 0.1–1.0)
Monocytes Relative: 7 %
Neutro Abs: 10.4 10*3/uL — ABNORMAL HIGH (ref 1.7–7.7)
Neutrophils Relative %: 85 %
Platelets: 193 10*3/uL (ref 150–400)
RBC: 4.26 MIL/uL (ref 4.22–5.81)
RDW: 13.6 % (ref 11.5–15.5)
WBC: 12.2 10*3/uL — ABNORMAL HIGH (ref 4.0–10.5)
nRBC: 0 % (ref 0.0–0.2)

## 2019-11-22 LAB — BASIC METABOLIC PANEL
Anion gap: 6 (ref 5–15)
BUN: 27 mg/dL — ABNORMAL HIGH (ref 8–23)
CO2: 25 mmol/L (ref 22–32)
Calcium: 8.9 mg/dL (ref 8.9–10.3)
Chloride: 106 mmol/L (ref 98–111)
Creatinine, Ser: 1.08 mg/dL (ref 0.61–1.24)
GFR calc Af Amer: >60 mL/min (ref >60)
GFR calc non Af Amer: >60 mL/min (ref >60)
Glucose, Bld: 111 mg/dL — ABNORMAL HIGH (ref 70–99)
Potassium: 4.4 mmol/L (ref 3.5–5.1)
Sodium: 137 mmol/L (ref 135–145)

## 2019-11-22 LAB — HEPARIN LEVEL (UNFRACTIONATED): Heparin Unfractionated: 0.11 IU/mL — ABNORMAL LOW (ref 0.30–0.70)

## 2019-11-22 LAB — GLUCOSE, CAPILLARY
Glucose-Capillary: 99 mg/dL (ref 70–99)
Glucose-Capillary: 107 mg/dL — ABNORMAL HIGH (ref 70–99)
Glucose-Capillary: 121 mg/dL — ABNORMAL HIGH (ref 70–99)
Glucose-Capillary: 126 mg/dL — ABNORMAL HIGH (ref 70–99)
Glucose-Capillary: 141 mg/dL — ABNORMAL HIGH (ref 70–99)

## 2019-11-22 IMAGING — CT CT HEAD W/O CM
4 series · 15 of 47 positions shown, 17 images · non-contrast
Comparison: CT head [DATE].  MRI head [DATE]

CLINICAL DATA: Stroke.  Meningioma.

EXAM:
CT HEAD WITHOUT CONTRAST
TECHNIQUE: Contiguous axial images were obtained from the base of the skull
through the vertex without intravenous contrast.

[Series 3: head without · axial · non-contrast · 0.49mm/px · z∈[-188,-62]mm · 7 of 35 slices shown, 9 images]
[im 5/35  brain]
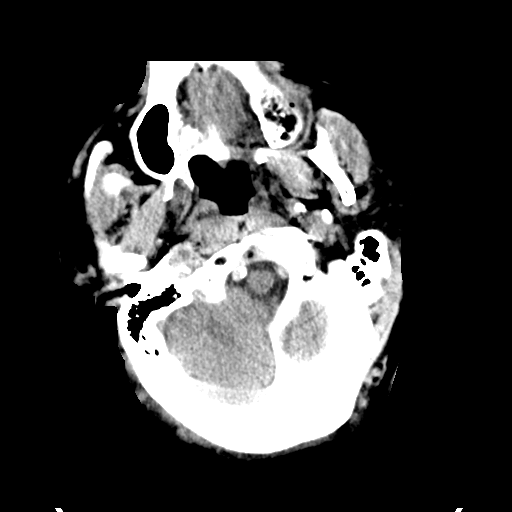
[im 5/35  bone]
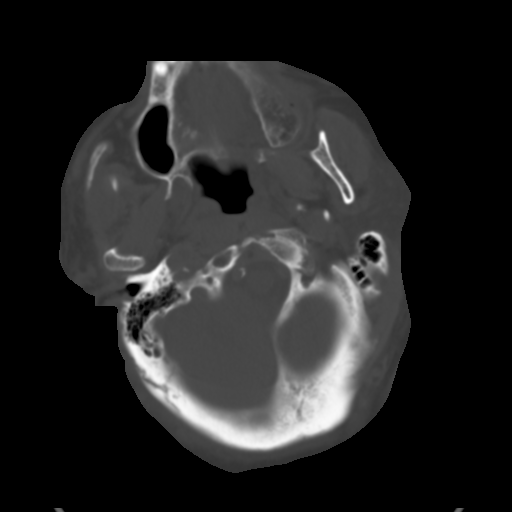
[im 9/35  brain]
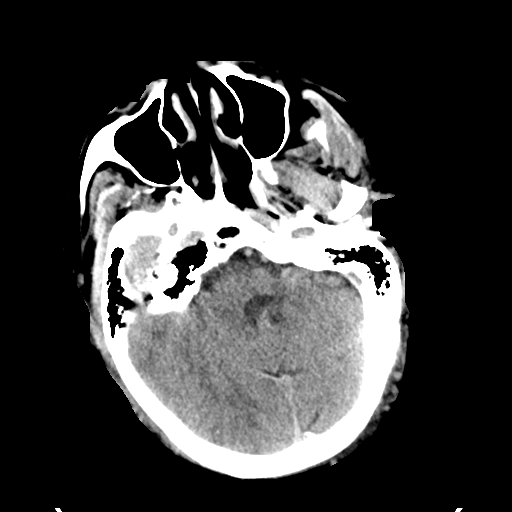
[im 13/35  brain]
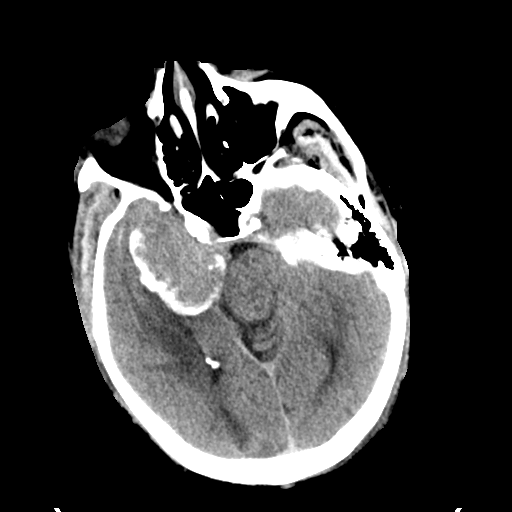
[im 18/35  brain]
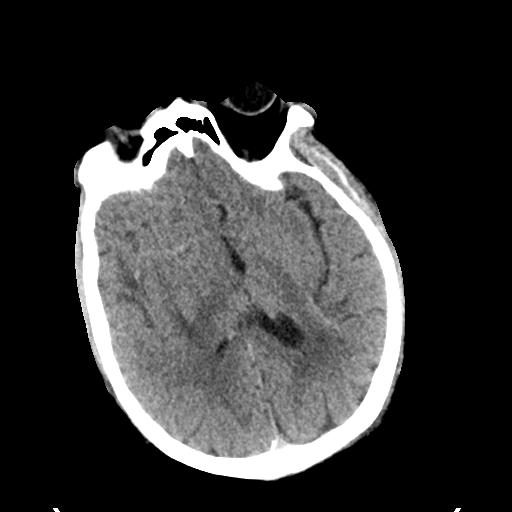
[im 22/35  brain]
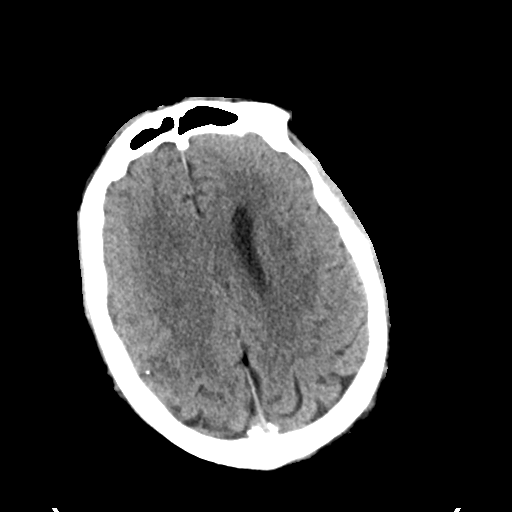
[im 22/35  bone]
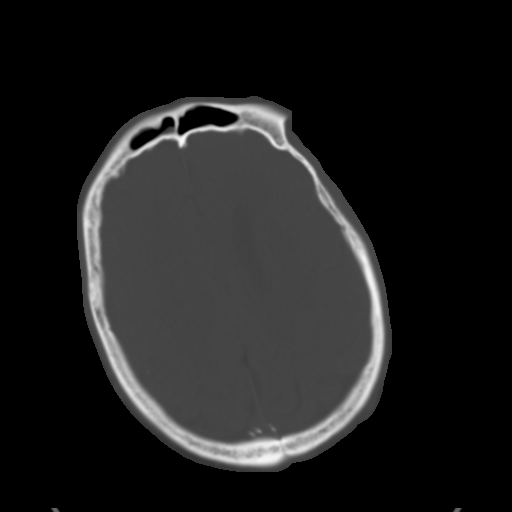
[im 26/35  brain]
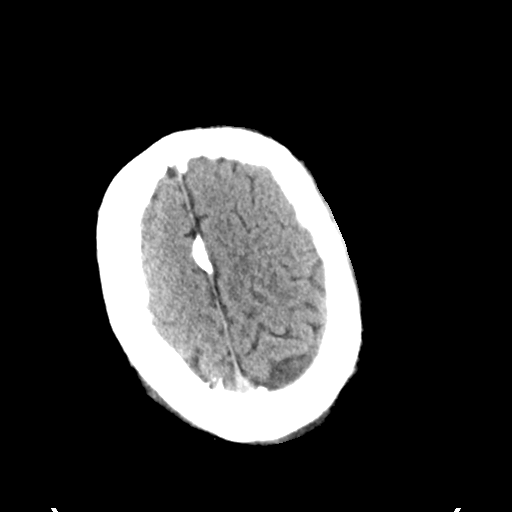
[im 30/35  brain]
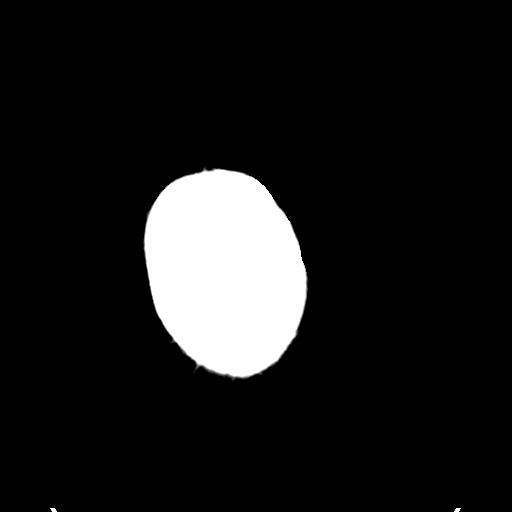

[Series 4: head bone · axial · 0.49mm/px · z∈[-192,-174]mm · 2 of 86 slices shown]
[im 9/86  bone]
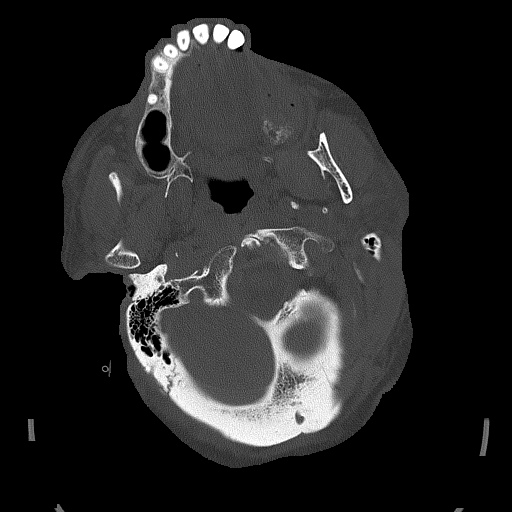
[im 18/86  bone]
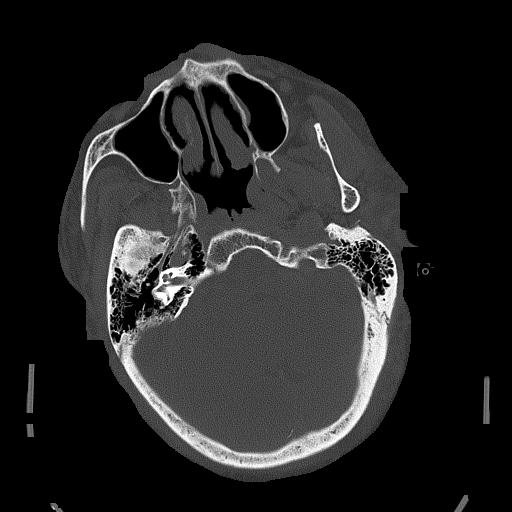

[Series 5: head without cor · coronal · non-contrast · 0.35mm/px · 3 of 78 slices shown]
[im 26/78  brain]
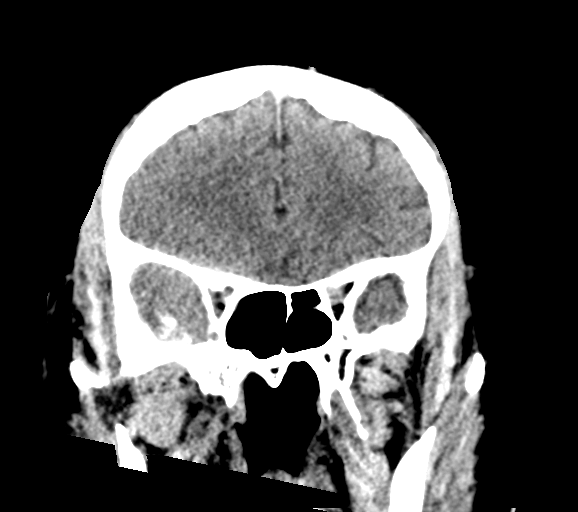
[im 35/78  brain]
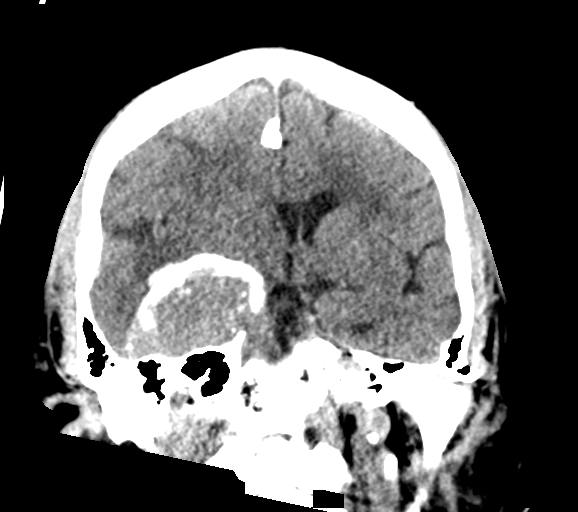
[im 43/78  brain]
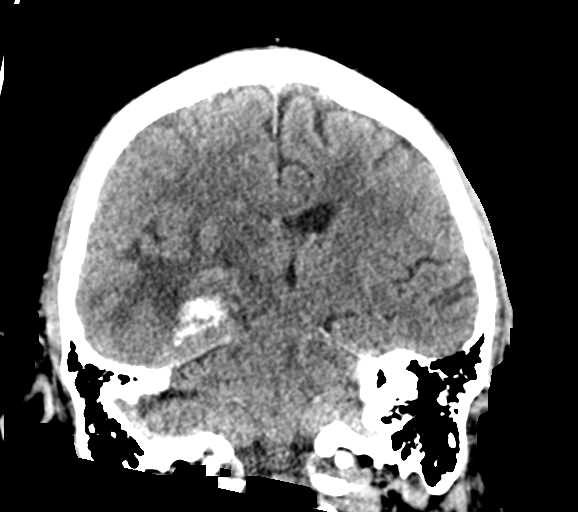

[Series 6: head without sag · sagittal · non-contrast · 0.36mm/px · 3 of 67 slices shown]
[im 28/67  brain]
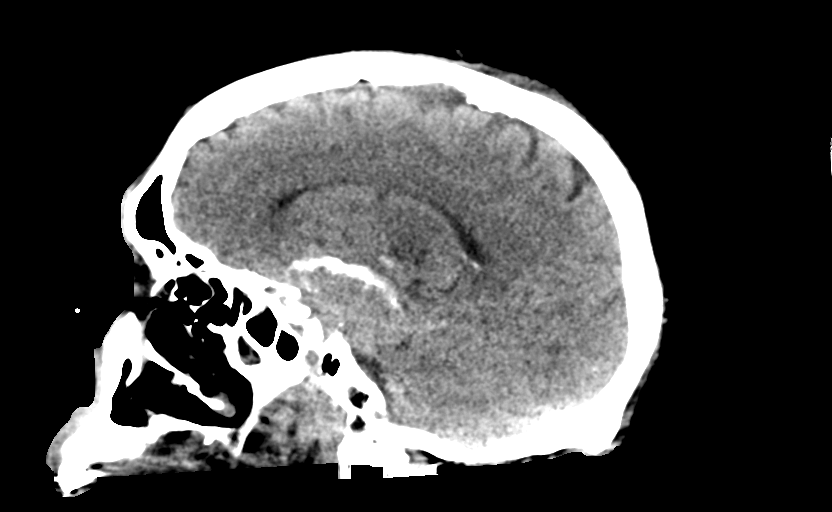
[im 34/67  brain]
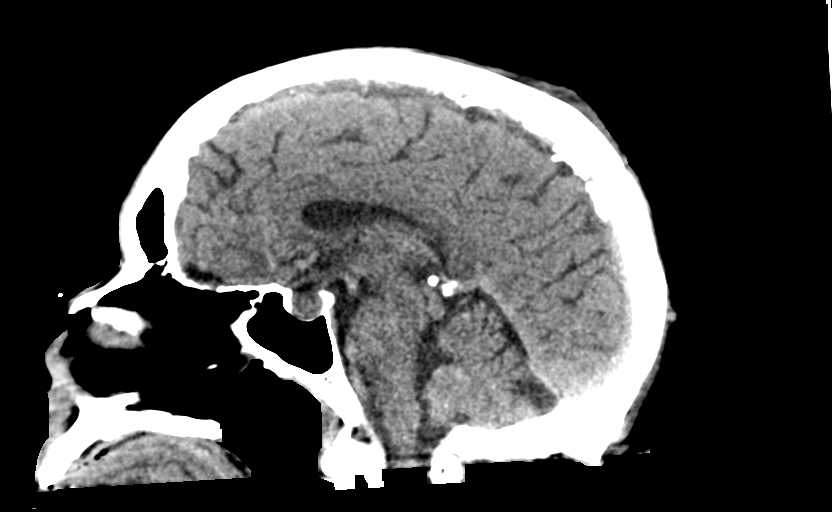
[im 40/67  brain]
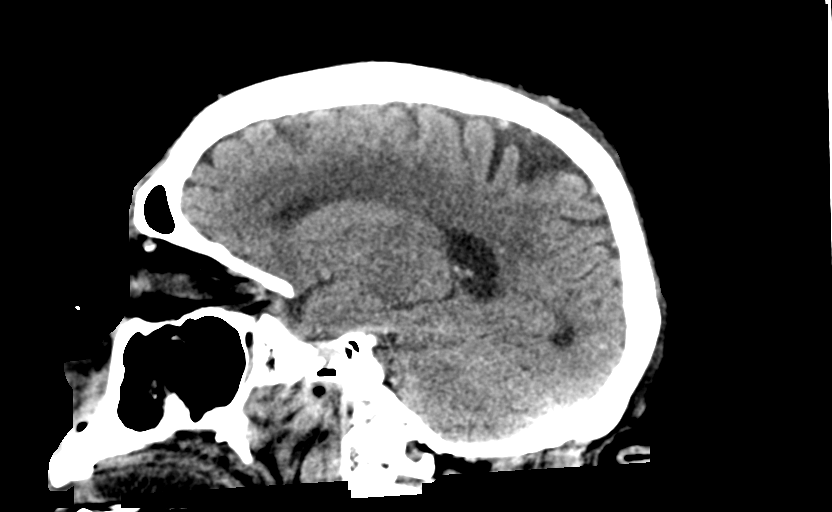

[15 of 47 positions shown; findings below may reference images not displayed]

FINDINGS: Brain: Large calcified meningioma right cavernous sinus unchanged
with surrounding vasogenic edema. 9 mm midline shift to the left
unchanged. Negative for hydrocephalus.

Right MCA infarct was identified on recent MRI. There are patchy
areas of hypodensity in the right frontal cortex over the convexity
and in the right occipital parietal lobe which show restricted
diffusion. Additional smaller areas of restricted diffusion in the
right basal ganglia show ill-defined patchy hypodensity on CT. No
acute hemorrhage. No change from recent CT.

Vascular: Negative for hyperdense vessel. Atherosclerotic
calcification in the distal vertebral arteries and the cavernous
carotid bilaterally.

Skull: Negative

Sinuses/Orbits: Mild mucosal edema paranasal sinuses. Negative orbit

Other: None
IMPRESSION: Subacute infarct right MCA territory is best identified on recent
MRI. There are areas of hypodensity in the right frontal lobe and
right occipital parietal lobe and right basal ganglia, unchanged
from the recent CT. These areas show restricted diffusion on MRI.
No acute hemorrhage

Large right cavernous calcified meningioma surrounding vasogenic
edema unchanged. 9 mm midline shift to the left unchanged.

## 2019-11-22 MED ORDER — HEPARIN (PORCINE) 25000 UT/250ML-% IV SOLN: 500.0000 [IU]/h | INTRAVENOUS | Status: DC

## 2019-11-22 NOTE — Progress Notes (Signed)
STROKE TEAM PROGRESS NOTE   INTERVAL HISTORY  Patient is doing well.  Is lying in bed.  Continues to to have left hemiparesis mostly and weakness and mild leg weakness which appears subjectively slightly worse today..  Blood pressure adequately controlled.   Vitals:   11/22/19 0345 11/22/19 0400 11/22/19 0500 11/22/19 0600  BP: 136/61 131/62 137/62 127/62  Pulse: (!) 58 (!) 57 62 60  Resp: 18 16 16 16   Temp:  98 F (36.7 C)    TempSrc:  Axillary    SpO2: 95% 96% 94% 95%  Weight:      Height:       CBC:  Recent Labs  Lab 11/21/19 0527 11/22/19 0500  WBC 11.2* 12.2*  NEUTROABS  --  10.4*  HGB 15.2 13.8  HCT 44.4 40.2  MCV 95.3 94.4  PLT 142* 580   Basic Metabolic Panel:  Recent Labs  Lab 11/21/19 0527 11/22/19 0500  NA 138 137  K 4.7 4.4  CL 106 106  CO2 24 25  GLUCOSE 120* 111*  BUN 32* 27*  CREATININE 1.27* 1.08  CALCIUM 9.0 8.9   Lipid Panel:  Recent Labs  Lab 11/15/19 1143  CHOL 189  TRIG 54  HDL 64  CHOLHDL 3.0  VLDL 11  LDLCALC 114*   HgbA1c:  Recent Labs  Lab 11/15/19 1143  HGBA1C 5.4   Urine Drug Screen: No results for input(s): LABOPIA, COCAINSCRNUR, LABBENZ, AMPHETMU, THCU, LABBARB in the last 168 hours.  Alcohol Level No results for input(s): ETH in the last 168 hours.  IMAGING past 24 hours No results found.  Neuro Interventional Radiology - Cerebral Angiogram - Dr Estanislado Pandy 11/19/19 S.P 4 vessel cerebral artrriogram RT radial approach. Findings. 1. High grade stenosis of approximately 75 % associated with a soft smooth plaque. 2. Approx 60 % stenosis of RT VA prox and 60 % 32f Lt VA origin. 3. Mass effect on RT MCA secondary to the large  sphenoid wing meningioma without stenosis or intraluminal filling defects. 4. Straightening of RT ICA supraclinoid seg due to encasement by the meningioma in the  Right cavernous sinus. 5. Dural venous sinuses patent.  Neuro Interventional Radiology - Intervention - Dr Estanislado Pandy 11/21/19 S/P RT  ICA revascularization with stent assisted angioplasty with prox flow arrest. Post procedure CT brain no ICH. Mass effect from the large calcified menigioma. Patient extubated. Denies any H/As N/V  Pupils 11mm RT = Lt sluggish. Moves RT arm and leg to command. Lt sided weakness unchanged. RT groin soft. Distal pulses dopplerable.   PHYSICAL EXAM    Temp:  [97.6 F (36.4 C)-98.4 F (36.9 C)] 98 F (36.7 C) (09/30 0400) Pulse Rate:  [53-81] 60 (09/30 0600) Resp:  [0-19] 16 (09/30 0600) BP: (99-149)/(52-93) 127/62 (09/30 0600) SpO2:  [90 %-100 %] 95 % (09/30 0600) Arterial Line BP: (138-172)/(53-75) 172/66 (09/29 1415) Weight:  [998.3 kg] 118.8 kg (09/29 1415)  General -obese elderly African-American male not in acute distress  Ophthalmologic - fundi not visualized due to noncooperation.  Cardiovascular - Regular rhythm and rate.  No murmur or gallop  Neuro - awake alert, eyes open, follow up simple commands. Oriented to age, time and place.  No dysarthria no aphasia, able to name and repeat simple sentences. Right gaze preference, able to cross midline, left gaze paresis. Eyes conjugate, but limited upper and lower gaze. 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 Left able to count fingers, intact pupillary reflex. Left facial  droop. Tongue midline. RUE 5/5, RLE 3+/5.   LUE 2/5 proximal  0/5 finger movement with increased muscle tone. LLE 3-/5 proximal and 3/5 distal DF. Sensation symmetrical per pt, right FTN intact. Gait not tested.   ASSESSMENT/PLAN Mr. CORDERO SURETTE is a 74 y.o. male with history of COPD, CHF, HTN, MI, OSA and known meningioma (from 05/2019 with R CRAO, followed by NS w/ scheduled appt for 9/23) presenting to [facility] 9/22 with L facial droop, L sided weakness and dysarthria.   Stroke: Scattered R MCA and ACA infarcts in setting of right ICA bulb and siphon stenosis and right MCA narrowing due to large R meningioma. Can not rule  out embolic source.  MRI  [facility]) 9/22 R MCA and ACA scattered infarcts. Elevation of R MCA d/t mass effect. R middle cranial fossa meningioma involving R cavernous sinus and sella turcica unchanged w/ 29mm L midline shift.  Vasogenic edema on the right brain.  CTA head large R cavernous sinus meningioma extending into prepontine cistern on the R, mass enters the sella. Extensive atherosclerotic calcification B cavernous ICA and supraclinoid ICA. Distal B VA mild stenoses. Right middle cerebral artery is up lifted by tumor.  CT - 11/16/19 -  Acute infarct right MCA territory in the right frontal parietal lobe extending into the occipital lobe is unchanged. 9 mm midline shift to the left unchanged.  Carotid Doppler nondiagnostic of right ICA due to extensive plaque  CTA neck Bulky eccentric plaque at the right bifurcation/proximal right ICA with associated stenosis of up to 65-70%  2D Echo - EF 60 - 65%. Findings concerning for MV mass - TEE recommended - Reviewed by Dr Johnsie Cancel 11/19/19 - TEE not felt to be indicated (see his note 9/27)  LDL 114  HgbA1c 5.4  VTE prophylaxis - lovenox   aspirin 81 mg daily prior to admission, now on aspirin 325 mg daily and plavix DAPT for 3 months and then plavix alone.   Therapy recommendations:  CIR recommended  Disposition:  pending   Meningioma, large  Large R cavernous area extending into prepontine cistern and sella w/ upward displacement of R MCA  Previously not a surgical candidate given R CRAO at time of dx  Currently not a surgical candidate given acute stroke  NS following for possible future removal  Vasogenic edema on Decadron IV  Carotid stenosis Hx of R CRAO  05/2019 right eye vision loss  02/2019 CUS Bilateral carotid atherosclerosis. No hemodynamically significant ICA stenosis.   This admission CUS nondiagnostic of right ICA due to extensive plaque  CTA neck Bulky eccentric plaque at the right bifurcation/proximal  right ICA with associated stenosis of up to 65-70%  Intervention - 11/21/19 - S/P RT ICA revascularization with stent assisted angioplasty with prox flow arrest.  Vasogenic edema  CT and MRI showed right brain vasogenic edema due to tumor  On decadron IV  Put on CBG and SSI  Vomiting 9/23   CT - 11/16/19 - Large right cavernous sinus meningioma unchanged. There is vasogenic edema in the right temporoparietal lobe. 9 mm midline shift to the left unchanged.  Hypertension  Home meds:  HCTZ 25 tid, imdur 120, amlodipine 10, lasix 80 bid, metoprolol 25 bid  On amlodipine 10, metoprolol 25 bid, lasix 40 bid and imdur 120 daily  On hydralazine PRN  BP stable   BP goal < 180/105 - gradually decrease BP to the long term goal . Long-term BP goal 130-150 given ICA siphon and  bulb stenosis (SBP 120's - 130's today)  Hyperlipidemia  Home meds:  lipitor 40, resumed in hospital  LDL 114, goal < 70  Increase lipitor to 80  Continue statin at discharge  Tobacco abuse  Current smoker  Smoking cessation counseling provided  Pt is willing to quit     Other Stroke Risk Factors  Advanced age  Obesity, recommend weight loss, diet and exercise as appropriate   Coronary artery disease, hx MI w/ PCI x 3  Obstructive sleep apnea, on CPAP at home  Chronic diastolic Congestive heart failure  Other Active Problems  Leukocytosis WBC 8.3->15.1->13.4->11.8->12.2 (afebrile)  COPD  Bradycardia (40's) (on Lopressor 25 mg Bid home meds) - now 50's and 60's  AKI - creatinine - 1.25 - encourage po intake - on IVF fluids 75 cc / hr ->1.43 ->1.08  Hospital day # 8 Plan continue mobilization out of bed.  Keep systolic blood pressure 299-371.  For 24 hours.  Therapy consults.  Continue aspirin and Plavix for recent carotid stent.  Repeat CT scan of the head has subjective left hemiparesis appears slightly worse to look for interval worsening of his stroke or hemorrhage.  Long discussion with  the patient and with Dr. Estanislado Pandy and answered questions.This patient is critically ill and at significant risk of neurological worsening, death and care requires constant monitoring of vital signs, hemodynamics,respiratory and cardiac monitoring, extensive review of multiple databases, frequent neurological assessment, discussion with family, other specialists and medical decision making of high complexity.I have made any additions or clarifications directly to the above note.This critical care time does not reflect procedure time, or teaching time or supervisory time of PA/NP/Med Resident etc but could involve care discussion time.  I spent 30 minutes of neurocritical care time  in the care of  this patient.    Antony Contras, Md     To contact Stroke Continuity provider, please refer to [email or web address] After hours, contact General Neurology

## 2019-11-22 NOTE — Progress Notes (Signed)
Supervising Physician: Luanne Bras  Patient Status: [facility] - In-pt  Subjective: S/P RT ICA revascularization with stent assisted angioplasty yesterday. Had some bleeding/oozing from (R)groin yesterday afternoon, additional pressure held. RN reports no further bleeding issues throughout the night. Pt denies HA, neck, pain, N/V Heparin gtt stopped this am  Objective: Physical Exam: BP (!) 132/59 (BP Location: Left Arm)   Pulse 64   Temp 98 F (36.7 C) (Oral)   Resp 16   Ht 5\' 11"  (1.803 m)   Wt 118.8 kg   SpO2 98%   BMI 36.54 kg/m  A&O x 3 Some movement of (L)LE, not much aof the left arm (R)groin soft, NT, no hematoma Pedal pulses dopplerable, foot warm.    Current Facility-Administered Medications:  .  0.9 %  sodium chloride infusion, , Intravenous, Continuous, Rinehuls, Early Chars, PA-C, Stopped at 11/21/19 [id number] .  0.9 %  sodium chloride infusion, , Intravenous, Continuous, Hodierne, Adam, MD, Last Rate: 10 mL/hr at 11/21/19 0913, New Bag at 11/21/19 1111 .  0.9 %  sodium chloride infusion, , Intravenous, Continuous, Deveshwar, Sanjeev, MD, Last Rate: 75 mL/hr at 11/22/19 0500, Rate Verify at 11/22/19 0500 .  acetaminophen (TYLENOL) tablet 650 mg, 650 mg, Oral, Q4H PRN **OR** acetaminophen (TYLENOL) 160 MG/5ML solution 650 mg, 650 mg, Per Tube, Q4H PRN **OR** acetaminophen (TYLENOL) suppository 650 mg, 650 mg, Rectal, Q4H PRN, Deveshwar, Sanjeev, MD .  albuterol (VENTOLIN HFA) 108 (90 Base) MCG/ACT inhaler 2 puff, 2 puff, Inhalation, Q6H PRN, Vallarie Mare, MD .  alum & mag hydroxide-simeth (MAALOX/MYLANTA) 200-200-20 MG/5ML suspension 15 mL, 15 mL, Oral, Q4H PRN, Vallarie Mare, MD, 15 mL at 11/16/19 1804 .  amLODipine (NORVASC) tablet 10 mg, 10 mg, Oral, Daily, Vallarie Mare, MD, 10 mg at 11/20/19 0925 .  aspirin chewable tablet 81 mg, 81 mg, Oral, Daily **OR** aspirin chewable tablet 81 mg, 81 mg, Per Tube, Daily, Deveshwar, Sanjeev, MD .   atorvastatin (LIPITOR) tablet 80 mg, 80 mg, Oral, q1800, Vallarie Mare, MD, 80 mg at 11/20/19 1833 .  chlorhexidine (PERIDEX) 0.12 % solution 15 mL, 15 mL, Mouth Rinse, BID, Vallarie Mare, MD, 15 mL at 11/22/19 0019 .  Chlorhexidine Gluconate Cloth 2 % PADS 6 each, 6 each, Topical, Daily, Vallarie Mare, MD, 6 each at 11/16/19 0800 .  clevidipine (CLEVIPREX) infusion 0.5 mg/mL, 0-21 mg/hr, Intravenous, Continuous, Deveshwar, Sanjeev, MD, Last Rate: 6 mL/hr at 11/22/19 0500, 3 mg/hr at 11/22/19 0500 .  dexamethasone (DECADRON) injection 4 mg, 4 mg, Intravenous, Q6H, Vallarie Mare, MD, 4 mg at 11/22/19 0527 .  docusate sodium (COLACE) capsule 100 mg, 100 mg, Oral, Daily PRN, Vallarie Mare, MD, 100 mg at 11/15/19 1257 .  furosemide (LASIX) tablet 40 mg, 40 mg, Oral, BID, Vallarie Mare, MD, 40 mg at 11/20/19 1833 .  hydrALAZINE (APRESOLINE) injection 10 mg, 10 mg, Intravenous, Q6H PRN, Vallarie Mare, MD, 10 mg at 11/17/19 6606 .  insulin aspart (novoLOG) injection 0-9 Units, 0-9 Units, Subcutaneous, Q6H, Vallarie Mare, MD, 1 Units at 11/22/19 0016 .  isosorbide mononitrate (IMDUR) 24 hr tablet 120 mg, 120 mg, Oral, Daily, Vallarie Mare, MD, 120 mg at 11/20/19 0925 .  MEDLINE mouth rinse, 15 mL, Mouth Rinse, q12n4p, Vallarie Mare, MD, 15 mL at 11/17/19 1505 .  metolazone (ZAROXOLYN) tablet 2.5 mg, 2.5 mg, Oral, Once per day on Mon, Thomas, Jonathan G, MD .  metoprolol tartrate (LOPRESSOR) tablet  12.5 mg, 12.5 mg, Oral, BID, Leonie Man, Pramod S, MD .  multivitamin with minerals tablet 1 tablet, 1 tablet, Oral, Daily, Vallarie Mare, MD, 1 tablet at 11/20/19 0925 .  nitroGLYCERIN (NITROSTAT) SL tablet 0.4 mg, 0.4 mg, Sublingual, Q5 Min x 3 PRN, Vallarie Mare, MD .  ondansetron [facility]) injection 4 mg, 4 mg, Intravenous, Q6H PRN, Vallarie Mare, MD, 4 mg at 11/16/19 1307 .  pantoprazole (PROTONIX) EC tablet 40 mg, 40 mg, Oral, Daily, Vallarie Mare, MD, 40 mg at 11/20/19 0926 .  potassium chloride (KLOR-CON) CR tablet 10 mEq, 10 mEq, Oral, BID, Vallarie Mare, MD, 10 mEq at 11/20/19 2156 .  ticagrelor (BRILINTA) tablet 90 mg, 90 mg, Oral, BID **OR** ticagrelor (BRILINTA) tablet 90 mg, 90 mg, Per Tube, BID, Deveshwar, Sanjeev, MD  Labs: CBC Recent Labs    11/21/19 0527 11/22/19 0500  WBC 11.2* 12.2*  HGB 15.2 13.8  HCT 44.4 40.2  PLT 142* 193   BMET Recent Labs    11/21/19 0527 11/22/19 0500  NA 138 137  K 4.7 4.4  CL 106 106  CO2 24 25  GLUCOSE 120* 111*  BUN 32* 27*  CREATININE 1.27* 1.08  CALCIUM 9.0 8.9   LFT No results for input(s): PROT, ALBUMIN, AST, ALT, ALKPHOS, BILITOT, BILIDIR, IBILI, LIPASE in the last 72 hours. PT/INR No results for input(s): LABPROT, INR in the last 72 hours.   Studies/Results: No results found.  Assessment/Plan: S/P RT ICA revascularization with stent assisted angioplasty Can DC C-collar and a-line ST to eval so he can receive ASA and Brilinta as ordered PLan per neuro. Would hold off OOB with PT another day to ensure no recurrent bleeding from right groin site.    LOS: 8 days   Ascencion Dike PA-C 11/22/2019 9:04 AM

## 2019-11-22 NOTE — Progress Notes (Signed)
OT Cancellation Note  Patient Details Name: LETICIA MCDIARMID MRN: [id number] DOB: [birth year]   Cancelled Treatment:    Reason Eval/Treat Not Completed: Other (comment) (Pt with bleeding from groin site, MD requesting hold this date. Will continue to follow.)  Zenovia Jarred, MSOT, OTR/L [facility] Office Number: [phone] Pager: [phone]  Zenovia Jarred 11/22/2019, 11:25 AM

## 2019-11-22 NOTE — Progress Notes (Signed)
ANTICOAGULATION CONSULT NOTE   Pharmacy Consult for Heparin Indication: s/p R-ICA stenting by IR  Allergies  Allergen Reactions  . Shrimp [Shellfish Allergy]     Patient Measurements: Height: 5\' 11"  (180.3 cm) Weight: 118.8 kg (262 lb) IBW/kg (Calculated) : 75.3  Heparin Dosing Weight: 101 kg  Vital Signs: Temp: 98 F (36.7 C) (09/30 0400) Temp Source: Axillary (09/30 0400) BP: 137/62 (09/30 0500) Pulse Rate: 62 (09/30 0500)  Labs: Recent Labs    11/20/19 1345 11/20/19 1345 11/21/19 0527 11/21/19 1839 11/22/19 0500 11/22/19 0541  HGB 14.9   < > 15.2  --  13.8  --   HCT 45.3  --  44.4  --  40.2  --   PLT 177  --  142*  --  193  --   HEPARINUNFRC  --   --   --  0.11*  --  0.11*  CREATININE 1.35*  --  1.27*  --   --   --    < > = values in this interval not displayed.    Estimated Creatinine Clearance: 64.9 mL/min (A) (by C-G formula based on SCr of 1.27 mg/dL (H)).  Assessment: 3 YOM who underwent scheduled right ICA revascularization with stent with IR today. Heparin was started post-IR for anticoagulation.   Heparin started at 500 units/hr around 1200 today. Dose likely slightly low for the patient's weight - but will wait to check a level around 1800 to determine dose adjustments.   Heparin level remains therapeutic (0.11) on gtt at 500 units/hr. No more bleeding noted.   Goal of Therapy:  Heparin level 0.1-0.25 units/ml Monitor platelets by anticoagulation protocol: Yes   Plan:  Continue heparin to resume at 500 units / hr - noted plan to d/c this morning at 0800 for sheath removal  Sherlon Handing, PharmD, BCPS Please see amion for complete clinical pharmacist phone list 11/22/2019 6:02 AM

## 2019-11-22 NOTE — Progress Notes (Signed)
Inpatient Rehabilitation Admissions Coordinator  I spoke with patient's 's wife by phone. She and patient live together and their 76 year old grandson lives with them but works nights. We discussed goals and expectations of a possible Cir admit pending his tolerance with therapies and caregiver support after a CIR stay. I recommended she discuss with family her wishes for CIR vs SNF and I will follow up with her tomorrow.He does have [employer] benefits.   Danne Baxter, RN, MSN Rehab Admissions Coordinator [phone] 11/22/2019 12:56 PM

## 2019-11-22 NOTE — Progress Notes (Signed)
PT Cancellation Note  Patient Details Name: DAREION KNEECE MRN: [id number] DOB: 11-Jun-1945   Cancelled Treatment:    Reason Eval/Treat Not Completed: Active bedrest order. Per discussion with RN radiology requesting pt remain on bedrest today after stenting yesterday. PT will attempt to follow up tomorrow.   Zenaida Niece 11/22/2019, 1:25 PM

## 2019-11-22 NOTE — Progress Notes (Signed)
  Speech Language Pathology Treatment: Dysphagia  Patient Details Name: Charles Sherman MRN: [id number] DOB: [birth year] Today's Date: 11/22/2019 Time: 3612-2449 SLP Time Calculation (min) (ACUTE ONLY): 10 min  Assessment / Plan / Recommendation Clinical Impression  Pt was seen for dysphagia treatment in order to monitor swallow function after ICA revascularization with stent placed. L sided facial weakness was noted during oral mech exam and PO trials. However, he tolerated all PO trials without any overt s/sx of aspiration. With a solid, he demonstrated diffculty manipulating the bolus on the L side due to facial weakness. Given min verbal cueing, he successfuly moved the bolus across midline to the R side to manipulate. Minimal buccal pocketing was observed. Recommend dys 2 diet and thin liquid. SLP will f/u acutely to assess diet toleration and advancement.    HPI HPI: Pt is a 74 y.o. 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 PMH also includes: OSA, MI, HTN, COPD, CHF      SLP Plan  Continue with current plan of care       Recommendations  Diet recommendations: Dysphagia 2 (fine chop);Thin liquid Liquids provided via: Straw Medication Administration: Whole meds with puree Supervision: Staff to assist with self feeding Compensations: Slow rate;Small sips/bites;Lingual sweep for clearance of pocketing Postural Changes and/or Swallow Maneuvers: Seated upright 90 degrees                Oral Care Recommendations: Oral care BID Follow up Recommendations: Inpatient Rehab SLP Visit Diagnosis: Dysphagia, oral phase (R13.11) Plan: Continue with current plan of care       GO                Greggory Keen 11/22/2019, 10:06 AM

## 2019-11-22 NOTE — Progress Notes (Addendum)
Providing Compassionate, Quality Care - Together  Neurosurgery Service Progress Note  Subjective: No acute events overnight. No new complaints during rounds. Pt was started on heparin gtt last night, stopped due to bleeding from right groin, stopped heparin gtt today.  Objective: Vitals:   11/22/19 1100 11/22/19 1115 11/22/19 1130 11/22/19 1200  BP: (!) 124/58 (!) 107/59 (!) 103/53   Pulse: (!) 52 (!) 52 (!) 54   Resp: 15 15 14    Temp:    98.3 F (36.8 C)  TempSrc:    Oral  SpO2: 96% 96% 95%   Weight:      Height:       Temp (24hrs), Avg:98.1 F (36.7 C), Min:97.9 F (36.6 C), Max:98.4 F (36.9 C)  CBC Latest Ref Rng & Units 11/22/2019 11/21/2019 11/20/2019  WBC 4.0 - 10.5 K/uL 12.2(H) 11.2(H) 10.9(H)  Hemoglobin 13.0 - 17.0 g/dL 13.8 15.2 14.9  Hematocrit 39 - 52 % 40.2 44.4 45.3  Platelets 150 - 400 K/uL 193 142(L) 177   BMP Latest Ref Rng & Units 11/22/2019 11/21/2019 11/20/2019  Glucose 70 - 99 mg/dL 111(H) 120(H) 119(H)  BUN 8 - 23 mg/dL 27(H) 32(H) 34(H)  Creatinine 0.61 - 1.24 mg/dL 1.08 1.27(H) 1.35(H)  Sodium 135 - 145 mmol/L 137 138 139  Potassium 3.5 - 5.1 mmol/L 4.4 4.7 4.6  Chloride 98 - 111 mmol/L 106 106 107  CO2 22 - 32 mmol/L 25 24 26   Calcium 8.9 - 10.3 mg/dL 8.9 9.0 9.1    Intake/Output Summary (Last 24 hours) at 11/22/2019 1304 Last data filed at 11/22/2019 1100 Gross per 24 hour  Intake 5253.98 ml  Output 775 ml  Net 4478.98 ml    Current Facility-Administered Medications:  .  0.9 %  sodium chloride infusion, , Intravenous, Continuous, Rinehuls, Early Chars, PA-C, Stopped at 11/21/19 [phone] .  0.9 %  sodium chloride infusion, , Intravenous, Continuous, Hodierne, Adam, MD, Last Rate: 10 mL/hr at 11/21/19 0913, New Bag at 11/21/19 1111 .  0.9 %  sodium chloride infusion, , Intravenous, Continuous, Deveshwar, Sanjeev, MD, Last Rate: 75 mL/hr at 11/22/19 0500, Rate Verify at 11/22/19 0500 .  acetaminophen (TYLENOL) tablet 650 mg, 650 mg, Oral, Q4H PRN  **OR** acetaminophen (TYLENOL) 160 MG/5ML solution 650 mg, 650 mg, Per Tube, Q4H PRN **OR** acetaminophen (TYLENOL) suppository 650 mg, 650 mg, Rectal, Q4H PRN, Deveshwar, Sanjeev, MD .  albuterol (VENTOLIN HFA) 108 (90 Base) MCG/ACT inhaler 2 puff, 2 puff, Inhalation, Q6H PRN, Vallarie Mare, MD .  alum & mag hydroxide-simeth (MAALOX/MYLANTA) 200-200-20 MG/5ML suspension 15 mL, 15 mL, Oral, Q4H PRN, Vallarie Mare, MD, 15 mL at 11/16/19 1804 .  amLODipine (NORVASC) tablet 10 mg, 10 mg, Oral, Daily, Vallarie Mare, MD, 10 mg at 11/22/19 1035 .  aspirin chewable tablet 81 mg, 81 mg, Oral, Daily, 81 mg at 11/22/19 1035 **OR** aspirin chewable tablet 81 mg, 81 mg, Per Tube, Daily, Deveshwar, Sanjeev, MD .  atorvastatin (LIPITOR) tablet 80 mg, 80 mg, Oral, q1800, Vallarie Mare, MD, 80 mg at 11/20/19 1833 .  chlorhexidine (PERIDEX) 0.12 % solution 15 mL, 15 mL, Mouth Rinse, BID, Vallarie Mare, MD, 15 mL at 11/22/19 1037 .  Chlorhexidine Gluconate Cloth 2 % PADS 6 each, 6 each, Topical, Daily, Vallarie Mare, MD, 6 each at 11/16/19 0800 .  dexamethasone (DECADRON) injection 4 mg, 4 mg, Intravenous, Q6H, Vallarie Mare, MD, 4 mg at 11/22/19 0527 .  docusate sodium (COLACE) capsule 100 mg,  100 mg, Oral, Daily PRN, Vallarie Mare, MD, 100 mg at 11/15/19 1257 .  furosemide (LASIX) tablet 40 mg, 40 mg, Oral, BID, Vallarie Mare, MD, 40 mg at 11/22/19 1035 .  hydrALAZINE (APRESOLINE) injection 10 mg, 10 mg, Intravenous, Q6H PRN, Vallarie Mare, MD, 10 mg at 11/17/19 [phone] .  insulin aspart (novoLOG) injection 0-9 Units, 0-9 Units, Subcutaneous, Q6H, Vallarie Mare, MD, 1 Units at 11/22/19 0016 .  isosorbide mononitrate (IMDUR) 24 hr tablet 120 mg, 120 mg, Oral, Daily, Vallarie Mare, MD, 120 mg at 11/22/19 1039 .  MEDLINE mouth rinse, 15 mL, Mouth Rinse, q12n4p, Vallarie Mare, MD, 15 mL at 11/17/19 1505 .  metolazone (ZAROXOLYN) tablet 2.5 mg, 2.5 mg, Oral,  Once per day on Mon, Thomas, Jonathan G, MD .  metoprolol tartrate (LOPRESSOR) tablet 12.5 mg, 12.5 mg, Oral, BID, Garvin Fila, MD .  multivitamin with minerals tablet 1 tablet, 1 tablet, Oral, Daily, Vallarie Mare, MD, 1 tablet at 11/22/19 1035 .  nitroGLYCERIN (NITROSTAT) SL tablet 0.4 mg, 0.4 mg, Sublingual, Q5 Min x 3 PRN, Vallarie Mare, MD .  ondansetron [facility]) injection 4 mg, 4 mg, Intravenous, Q6H PRN, Vallarie Mare, MD, 4 mg at 11/16/19 1307 .  pantoprazole (PROTONIX) EC tablet 40 mg, 40 mg, Oral, Daily, Vallarie Mare, MD, 40 mg at 11/22/19 1035 .  potassium chloride (KLOR-CON) CR tablet 10 mEq, 10 mEq, Oral, BID, Vallarie Mare, MD, 10 mEq at 11/22/19 1034 .  ticagrelor (BRILINTA) tablet 90 mg, 90 mg, Oral, BID, 90 mg at 11/22/19 1035 **OR** ticagrelor (BRILINTA) tablet 90 mg, 90 mg, Per Tube, BID, Luanne Bras, MD   Physical Exam: Awake, laying in bed;  oriented to age, time and place. No aphasia noted, answered questions appropriately. Follow simple commands, Right gaze preference, able to cross midline, left gaze paresis. RUE and LLE 5/5, LUE: 1/5, unable to grip my hands, LLE: 3/5.   Assessment & Plan: 74 year old gentleman withPMHx of COPD, CHF, HTN, MI, OSA, andknown right medial sphenoid wing meningioma who [facility] on [facility] with left-sided weaknessand left facial droop,work-up is revealed multiple acute infarcts right MCA distribution. April, 2021-CT scan revealed a right sphenoid wing meningioma on. He was supposed to follow up with neurosurgery outpatient on 9/22 to go over results of his MRI, however he had acute onset of left sided weakness and facial droop.  1. Meningioma, large -Large right cavernous area extending into prepontine cistern and sella w/upward displacement of R MCA -Currently not a surgical candidate given acute stroke -Vasogenic edema on Decadron IV 4mg  q6, put on CBG and insulin scale  .  Stroke:Carotid stenosis: Right MCA and ACA infarcts and right MCA narrowing due to large R meningioma. -F/U with neurology vascular consults -CTA neck 11/16/19-Bulky eccentric plaque at the right bifurcation/proximal right ICA with associated stenosis of up to 65-70% by NASCET criteria.Mild atheromatous change about the left carotid bifurcation/proximal left ICA with no more than mild 10-20% stenosis by NASCET criteria. -CT head - 11/16/19 - Acute infarct right MCA territory in the right frontal parietal lobeextending into the occipital lobe is unchanged. 9 mm midline shift to theleft unchanged. -TEE revealed: mitral annular calcification with some nobular thickening of mitral leaflets, Dr. Johnsie Cancel doesn't think TEE is needed at this point. -DAPT: aspirin 325 mg daily and ticagrelor 90 mg BID -Angiogram (9/27) shows severe proximal right ICA stenosis from a calcific plaque with a soft plaque component. Approx.  60% stenosis of RT VA prox and 60% of left VA origin.  -s/p right ICA revascularization with stent assisted angioplasty 9/29. Started on heparin gtt started last night, pt had some bleeding from R. Groin yesterday, heparin gtt stopped this morning. Pt denies HA, nausea, dizziness at this point.  -Repeated head CT scan this morning (9/30)- no changes noted, no hemorrahage -DVT ppx: Enoxaparin 40 mg q24 hr, SCDs  3.Hypertension -BP controlled at this point, around 120s -On amlodipine 10, metoprolol 25 bid,furosemide40 bid and isosorbide mononitrate120 mgdaily  4. AKI on chronic CKD? -Cr level: 1.08 (9/30), getting better  9/29: 1.27 -Increase PO intake, will continue to monitor -continue 75cc/hr continuous -BMP daily   Osie Cheeks 11/22/19 1:04 PM

## 2019-11-23 LAB — GLUCOSE, CAPILLARY
Glucose-Capillary: 127 mg/dL — ABNORMAL HIGH (ref 70–99)
Glucose-Capillary: 110 mg/dL — ABNORMAL HIGH (ref 70–99)
Glucose-Capillary: 151 mg/dL — ABNORMAL HIGH (ref 70–99)
Glucose-Capillary: 111 mg/dL — ABNORMAL HIGH (ref 70–99)

## 2019-11-23 MED ORDER — ENOXAPARIN SODIUM 40 MG/0.4ML ~~LOC~~ SOLN
40.0000 mg | SUBCUTANEOUS | Status: DC
  Administered 2019-11-23 – 2019-11-27 (×5): 40 mg via SUBCUTANEOUS
  Filled 2019-11-23 (×5): qty 0.4

## 2019-11-23 MED ORDER — DEXAMETHASONE SODIUM PHOSPHATE 4 MG/ML IJ SOLN
4.0000 mg | Freq: Two times a day (BID) | INTRAMUSCULAR | Status: AC
  Administered 2019-11-23 – 2019-11-26 (×7): 4 mg via INTRAVENOUS
  Filled 2019-11-23 (×8): qty 1

## 2019-11-23 NOTE — Plan of Care (Signed)
  Problem: Education: Goal: Knowledge of disease or condition will improve Outcome: Progressing   Problem: Nutrition: Goal: Risk of aspiration will decrease Outcome: Progressing Goal: Dietary intake will improve Outcome: Progressing   Problem: Clinical Measurements: Goal: Will remain free from infection Outcome: Progressing   Problem: Nutrition: Goal: Adequate nutrition will be maintained Outcome: Progressing   Problem: Pain Managment: Goal: General experience of comfort will improve Outcome: Progressing   Problem: Safety: Goal: Ability to remain free from injury will improve Outcome: Progressing   Problem: Skin Integrity: Goal: Risk for impaired skin integrity will decrease Outcome: Progressing

## 2019-11-23 NOTE — PMR Pre-admission (Addendum)
PMR Admission Coordinator Pre-Admission Assessment  Patient: Charles Sherman is an 74 y.o., male MRN: [id number] DOB: Sep 30, 1945 Height: 5\' 11"  (180.3 cm) Weight: 118.8 kg              Insurance Information HMO:     PPO:      PCP:      IPA:      80/20:      OTHER:  PRIMARY: Medicare part A only      Policy#: [id number]      Subscriber: pt Benefits:  Phone #: passport one online     Name: 10/1 Eff. Date: 05/23/2004     Deduct: $1484      Out of Pocket Max: none      Life Max: none  CIR: 100%      SNF: 20 full days Outpatient: 80%     Co-Pay: 20% Home Health: 100%      Co-Pay: none DME: 805     Co-Pay: 205 Providers: pt choice  SECONDARY: [place]      Policy#: [id number]        Financial Counselor:       Phone#:   The "Data Collection Information Summary" for patients in Inpatient Rehabilitation Facilities with attached "Privacy Act [place] Records" was provided and verbally reviewed with: Patient and Family  Emergency Contact Information Contact Information    Name Relation Home Work Mobile   Kabat,Letricia Spouse [phone]     Felder, Lebeda Sister   [phone]     Current Medical History  Patient Admitting Diagnosis: CVA  History of Present Illness: Vitolo is a 75 year old right-handed male with history of diastolic congestive heart failure, hypertension, OSA on CPAP, tobacco abuse as well as 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  Per chart review lives with spouse.  1 level home 2 steps to entry.  Reportedly independent prior to admission and active.  Presented 11/14/2019 with left-sided weakness.  Cranial CT scan showed acute infarct right MCA territory in the right frontal parietal lobe extending into the occipital lobe.  No acute hemorrhage.  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  There was some vasogenic edema in the right temporal parietal lobe 9 mm midline shift to  the left again unchanged.  CT angiogram of head and neck with extensive atherosclerotic calcification in the cavernous carotids bilaterally with mild stenosis.  The tumor abuts the right cavernous and supraclinoid internal carotid artery without encasement or occlusion.  Neurosurgery did review scanned no current plan for arteriography or stenting at this time.  Admission chemistries glucose 121 WBC 15,000.  Echocardiogram showed no significant regurgitation or distraction of the valve to suggest endocarditis.  Left ventricular ejection fraction 60 to 65% no wall motion abnormalities grade 1 diastolic dysfunction.  No current plan for TEE after echocardiogram reviewed by cardiology services.  Cerebral angiogram completed showing high-grade stenosis of approximately 75% associated with soft smooth plaque.  Approximate 60% stenosis of right VA proximal and 60% of left VA origin.  Mass-effect on right MCA secondary to large sphenoid wing meningioma without stenosis or intraluminal filling defect.  Patient did undergo right ICA revascularization with stent assisted angioplasty 11/21/2019.  Patient currently maintained on aspirin as well as Brilinta and he was cleared to begin Lovenox for DVT prophylaxis.  Completing Decadron protocol.  Currently on a dysphagia #2 thin liquid diet.   Complete NIHSS TOTAL: 12 Glasgow Coma Scale Score: 15  Past Medical History  Past  Medical History:  Diagnosis Date  . CHF (congestive heart failure) ([place])   . COPD (chronic obstructive pulmonary disease) ([place])   . HTN (hypertension)   . Myocardial infarct ([place])   . OSA (obstructive sleep apnea)    on CPAP    Family History  family history includes Alzheimer's disease in his mother; Diabetes in his sister; Heart disease in his father; Stomach cancer in his brother.  Prior Rehab/Hospitalizations:  Has the patient had prior rehab or hospitalizations prior to admission? Yes  Has the patient had major surgery during 100 days  prior to admission? Yes  Current Medications   Current Facility-Administered Medications:  .  0.9 %  sodium chloride infusion, , Intravenous, Continuous, Rinehuls, Early Chars, PA-C, Last Rate: 75 mL/hr at 11/28/19 0343, New Bag at 11/28/19 0343 .  0.9 %  sodium chloride infusion, , Intravenous, Continuous, Hodierne, Adam, MD, Last Rate: 10 mL/hr at 11/21/19 0913, New Bag at 11/21/19 1111 .  0.9 %  sodium chloride infusion, , Intravenous, Continuous, Deveshwar, Sanjeev, MD, Last Rate: 75 mL/hr at 11/27/19 0016, New Bag at 11/27/19 0016 .  acetaminophen (TYLENOL) tablet 650 mg, 650 mg, Oral, Q4H PRN **OR** acetaminophen (TYLENOL) 160 MG/5ML solution 650 mg, 650 mg, Per Tube, Q4H PRN **OR** acetaminophen (TYLENOL) suppository 650 mg, 650 mg, Rectal, Q4H PRN, Deveshwar, Sanjeev, MD .  albuterol (VENTOLIN HFA) 108 (90 Base) MCG/ACT inhaler 2 puff, 2 puff, Inhalation, Q6H PRN, Vallarie Mare, MD .  alum & mag hydroxide-simeth (MAALOX/MYLANTA) 200-200-20 MG/5ML suspension 15 mL, 15 mL, Oral, Q4H PRN, Vallarie Mare, MD, 15 mL at 11/16/19 1804 .  amLODipine (NORVASC) tablet 10 mg, 10 mg, Oral, Daily, Vallarie Mare, MD, 10 mg at 11/28/19 0921 .  aspirin chewable tablet 81 mg, 81 mg, Oral, Daily, 81 mg at 11/28/19 0921 **OR** aspirin chewable tablet 81 mg, 81 mg, Per Tube, Daily, Deveshwar, Sanjeev, MD .  atorvastatin (LIPITOR) tablet 80 mg, 80 mg, Oral, q1800, Vallarie Mare, MD, 80 mg at 11/27/19 1808 .  chlorhexidine (PERIDEX) 0.12 % solution 15 mL, 15 mL, Mouth Rinse, BID, Vallarie Mare, MD, 15 mL at 11/27/19 2242 .  Chlorhexidine Gluconate Cloth 2 % PADS 6 each, 6 each, Topical, Daily, Vallarie Mare, MD, 6 each at 11/28/19 [phone] .  dexamethasone (DECADRON) tablet 1 mg, 1 mg, Oral, Q12H, Kristeen Miss, MD, 1 mg at 11/28/19 0920 .  docusate sodium (COLACE) capsule 100 mg, 100 mg, Oral, Daily PRN, Vallarie Mare, MD, 100 mg at 11/25/19 1044 .  enoxaparin (LOVENOX) injection 40  mg, 40 mg, Subcutaneous, Q24H, Leonie Man, Pramod S, MD, 40 mg at 11/27/19 1550 .  furosemide (LASIX) tablet 40 mg, 40 mg, Oral, BID, Vallarie Mare, MD, 40 mg at 11/28/19 0802 .  hydrALAZINE (APRESOLINE) injection 10 mg, 10 mg, Intravenous, Q6H PRN, Vallarie Mare, MD, 10 mg at 11/17/19 [phone] .  insulin aspart (novoLOG) injection 0-9 Units, 0-9 Units, Subcutaneous, Q6H, Vallarie Mare, MD, 2 Units at 11/28/19 0020 .  isosorbide mononitrate (IMDUR) 24 hr tablet 120 mg, 120 mg, Oral, Daily, Vallarie Mare, MD, 120 mg at 11/28/19 0921 .  MEDLINE mouth rinse, 15 mL, Mouth Rinse, q12n4p, Vallarie Mare, MD, 15 mL at 11/27/19 1342 .  metolazone (ZAROXOLYN) tablet 2.5 mg, 2.5 mg, Oral, Once per day on Mon, Thomas, Jonathan G, MD, 2.5 mg at 11/26/19 0745 .  metoprolol tartrate (LOPRESSOR) tablet 12.5 mg, 12.5 mg, Oral, BID, Garvin Fila, MD,  12.5 mg at 11/26/19 2141 .  multivitamin with minerals tablet 1 tablet, 1 tablet, Oral, Daily, Vallarie Mare, MD, 1 tablet at 11/28/19 [phone] .  nitroGLYCERIN (NITROSTAT) SL tablet 0.4 mg, 0.4 mg, Sublingual, Q5 Min x 3 PRN, Vallarie Mare, MD .  ondansetron [facility]) injection 4 mg, 4 mg, Intravenous, Q6H PRN, Vallarie Mare, MD, 4 mg at 11/16/19 1307 .  pantoprazole (PROTONIX) EC tablet 40 mg, 40 mg, Oral, Daily, Vallarie Mare, MD, 40 mg at 11/28/19 0921 .  potassium chloride (KLOR-CON) CR tablet 10 mEq, 10 mEq, Oral, BID, Vallarie Mare, MD, 10 mEq at 11/28/19 0921 .  ticagrelor (BRILINTA) tablet 90 mg, 90 mg, Oral, BID, 90 mg at 11/28/19 0921 **OR** ticagrelor (BRILINTA) tablet 90 mg, 90 mg, Per Tube, BID, Luanne Bras, MD  Patients Current Diet:  Diet Order            DIET DYS 3 Room service appropriate? Yes with Assist; Fluid consistency: Thin  Diet effective now                 Precautions / Restrictions Precautions Precautions: Fall Precaution Comments: L sided neglect Restrictions Weight Bearing  Restrictions: No   Has the patient had 2 or more falls or a fall with injury in the past year?No  Prior Activity Level Limited Community (1-2x/wk): Independent without AD; driving  Prior Functional Level Prior Function Level of Independence: Independent Comments: pt reports he enjoys going fishing  Self Care: Did the patient need help bathing, dressing, using the toilet or eating?  Independent  Indoor Mobility: Did the patient need assistance with walking from room to room (with or without device)? Independent  Stairs: Did the patient need assistance with internal or external stairs (with or without device)? Independent  Functional Cognition: Did the patient need help planning regular tasks such as shopping or remembering to take medications? Independent  Home Assistive Devices / Equipment Home Equipment: Walker - 2 wheels, Cane - single point  Prior Device Use: Indicate devices/aids used by the patient prior to current illness, exacerbation or injury? None of the above  Current Functional Level Cognition  Arousal/Alertness: Lethargic Overall Cognitive Status: Impaired/Different from baseline Current Attention Level: Sustained Orientation Level: Oriented X4 Following Commands: Follows one step commands consistently Safety/Judgement: Decreased awareness of deficits, Decreased awareness of safety General Comments: decr awareness of Lt side; can assist to correct left lean and then loses awareness and slowly drifts to his left again Attention: Sustained Sustained Attention: Impaired Sustained Attention Impairment: Verbal basic Memory: Impaired Memory Impairment: Retrieval deficit, Decreased short term memory Decreased Short Term Memory: Verbal basic (4/4 immediate recall; 2/4 delayed; 4/4 with cues) Awareness: Impaired Awareness Impairment: Intellectual impairment Safety/Judgment: Impaired    Extremity Assessment (includes Sensation/Coordination)  Upper Extremity  Assessment: LUE deficits/detail LUE Deficits / Details: does not activate during session. edema present no subluxation noted. positioned with sheet as sling during transfer for safety of arm LUE Sensation: decreased proprioception LUE Coordination: decreased gross motor  Lower Extremity Assessment: Defer to PT evaluation LLE Deficits / Details: grossly 4-/5 LLE Sensation: decreased proprioception    ADLs  Overall ADL's : Needs assistance/impaired Eating/Feeding: Total assistance, Sitting Grooming: Min guard, Sitting Grooming Details (indicate cue type and reason): close guarding to minA for sitting balance; cues for thoroughness when washing L side of face  Upper Body Bathing: Maximal assistance Upper Body Bathing Details (indicate cue type and reason): supported sitting Lower Body Bathing: Total assistance Lower Body Bathing  Details (indicate cue type and reason): Max A +2 sit<>stand Upper Body Dressing : Total assistance Upper Body Dressing Details (indicate cue type and reason): supported sitting Lower Body Dressing: Total assistance Lower Body Dressing Details (indicate cue type and reason): Max A +2 sit<>stand Toilet Transfer: Maximal assistance, +2 for physical assistance, Stand-pivot, BSC Toileting- Clothing Manipulation and Hygiene: Total assistance Toileting - Clothing Manipulation Details (indicate cue type and reason): Max A +2 sit<>stand Functional mobility during ADLs: Maximal assistance, +2 for physical assistance, +2 for safety/equipment General ADL Comments: pt remains with impaired cognition, L side inattention, decreased sitting/standing balance     Mobility  Overal bed mobility: Needs Assistance Bed Mobility: Supine to Sit Rolling: Max assist, +2 for physical assistance Sidelying to sit: Max assist, +2 for physical assistance Supine to sit: Max assist, HOB elevated (nearing modA) Sit to supine: +2 for physical assistance, Max assist General bed mobility comments:  Pt using rt extremities to assist; return to sidelying he was able to assist with lifting each leg up onto bed    Transfers  Overall transfer level: Needs assistance Equipment used: 2 person hand held assist Transfer via Lift Equipment: Stedy Transfers: Sit to/from Stand Sit to Stand: +2 physical assistance, Mod assist, From elevated surface Stand pivot transfers: Max assist, +2 physical assistance General transfer comment: pt performs 2 sit to stands with stedy; initial attempt stood more erect with left lean and noted incontinent of bowels; returned to sit and then stood second time for cleaning (pt stood ~90-120 sec) and returned to bed for completion of pericare.     Ambulation / Gait / Stairs / Wheelchair Mobility  Ambulation/Gait General Gait Details: unable at this time    Posture / Balance Dynamic Sitting Balance Sitting balance - Comments: Pt requires min A for balance.  he demonstrates Lt lateral lean requiring cues to shift to midline.   Balance Overall balance assessment: Needs assistance Sitting-balance support: Single extremity supported, Feet supported Sitting balance-Leahy Scale: Poor Sitting balance - Comments: Pt requires min A for balance.  he demonstrates Lt lateral lean requiring cues to shift to midline.   Postural control: Left lateral lean Standing balance support: Single extremity supported (LUE in "sling" (sheet) due to flaccid; RUE on stedy bar) Standing balance-Leahy Scale: Poor Standing balance comment: mod-maxA x2- facilitation Lt ischium and left ribs    Special needs/care consideration Visitor is wife, Alysia Penna who visits when her sister in law, Claudine gets off work daily so comes in afternoon after 4 pm Patient is [place] connected with [place], [place]. Wife is requesting PCS assist through the [place] and states number to call is [phone] fax [phone]     Previous Home Environment  Living Arrangements: Spouse/significant other (adult grandson)  Lives  With: Spouse, Family Available Help at Discharge: Family, Available 24 hours/day Type of Home: House Home Layout: One level, Laundry or work area in basement Home Access: Stairs to enter Entrance Stairs-Rails: None Technical brewer of Steps: 2 Bathroom Shower/Tub: Chiropodist: Standard Bathroom Accessibility: Yes How Accessible: Accessible via walker [place]: No Additional Comments: Adult grandson works nights; wife's sister is a Quarry manager who can assist Sister ,Claudine is a Administrator, [employer] store in [place]. Other family are CNA can assist  Discharge Living Setting Plans for Discharge Living Setting: Patient's home, Lives with (comment) (wife and adult grandson) Type of Home at Discharge: House Discharge Home Layout: One level, Laundry or work area in basement Discharge Home Access: Stairs to enter  Entrance Stairs-Rails: None Entrance Stairs-Number of Steps: 2 Discharge Bathroom Shower/Tub: Tub/shower unit Discharge Bathroom Toilet: Standard Discharge Bathroom Accessibility: Yes How Accessible: Accessible via walker Does the patient have any problems obtaining your medications?: No  Social/Family/Support Systems Patient Roles: Spouse Contact Information: wife, Alysia Penna Anticipated Caregiver: wife, grandson and sister in Sports coach Anticipated Caregiver's Contact Information: see above Caregiver Availability: 24/7 Discharge Plan Discussed with Primary Caregiver: Yes Is Caregiver In Agreement with Plan?: Yes Does Caregiver/Family have Issues with Lodging/Transportation while Pt is in Rehab?: No  Goals Patient/Family Goal for Rehab: min assist with PT, OT, and SLP Expected length of stay: [facility] 2 to 3 weeks Additional Information: [place] phone [phone] fax [phone] (wife would like home services through the [place]) Pt/Family Agrees to Admission and willing to participate: Yes Program Orientation Provided & Reviewed with Pt/Caregiver Including  Roles  & Responsibilities: Yes  Decrease burden of Care through IP rehab admission: n/a  Possible need for SNF placement upon discharge:not antiicapted  Patient Condition: This patient's medical and functional status has changed since the consult dated: 11/19/2019 in which the Rehabilitation Physician determined and documented that the patient's condition is appropriate for intensive rehabilitative care in an inpatient rehabilitation facility. See "History of Present Illness" (above) for medical update. Functional changes are: overall mod to max asisst. Patient's medical and functional status update has been discussed with the Rehabilitation physician and patient remains appropriate for inpatient rehabilitation. Will admit to inpatient rehab today.  Preadmission Screen Completed By:  Cleatrice Burke, RN, 11/28/2019 10:20 AM ______________________________________________________________________   Discussed status with Dr. Ranell Patrick on 11/28/2019 at  71 and received approval for admission today.  Admission Coordinator:  Cleatrice Burke, time 1020 Date 11/28/2019

## 2019-11-23 NOTE — Progress Notes (Addendum)
Inpatient Rehabilitation Admissions Coordinator  I have left a voicemail for pt's wife to contact me to further discuss his rehab options.  Danne Baxter, RN, MSN Rehab Admissions Coordinator [phone] 11/23/2019 1:12 PM   I spoke with patient's wife by phone an she prefers CIR admit and then d/c home with [place] services for therapy at home and family members assistance. She prefers Cir rather than SNF. I will follow up next week to clarify when medical workup completion and bed at [facility].  Danne Baxter, RN, MSN Rehab Admissions Coordinator [phone] 11/23/2019 2:45 PM

## 2019-11-23 NOTE — Progress Notes (Signed)
STROKE TEAM PROGRESS NOTE   INTERVAL HISTORY Patient is lying in bed.  His left hemiparesis remains unchanged.  Blood pressure adequately controlled.  Vital signs stable.  Neurological exam unchanged.  No new issues noted   Vitals:   11/23/19 0000 11/23/19 0100 11/23/19 0200 11/23/19 0300  BP: 113/60 105/62 106/61 105/61  Pulse: (!) 57 (!) 52 (!) 52 (!) 47  Resp: (!) 6 20 (!) 8 15  Temp: 99.1 F (37.3 C)     TempSrc: Axillary     SpO2: 92% 93% 92% 94%  Weight:      Height:       CBC:  Recent Labs  Lab 11/21/19 0527 11/22/19 0500  WBC 11.2* 12.2*  NEUTROABS  --  10.4*  HGB 15.2 13.8  HCT 44.4 40.2  MCV 95.3 94.4  PLT 142* 938   Basic Metabolic Panel:  Recent Labs  Lab 11/21/19 0527 11/22/19 0500  NA 138 137  K 4.7 4.4  CL 106 106  CO2 24 25  GLUCOSE 120* 111*  BUN 32* 27*  CREATININE 1.27* 1.08  CALCIUM 9.0 8.9   Lipid Panel:  No results for input(s): CHOL, TRIG, HDL, CHOLHDL, VLDL, LDLCALC in the last 168 hours. HgbA1c:  No results for input(s): HGBA1C in the last 168 hours. Urine Drug Screen: No results for input(s): LABOPIA, COCAINSCRNUR, LABBENZ, AMPHETMU, THCU, LABBARB in the last 168 hours.  Alcohol Level No results for input(s): ETH in the last 168 hours.  IMAGING past 24 hours CT Head Wo Contrast  Result Date: 11/22/2019 CLINICAL DATA:  Stroke.  Meningioma. EXAM: CT HEAD WITHOUT CONTRAST TECHNIQUE: Contiguous axial images were obtained from the base of the skull through the vertex without intravenous contrast. COMPARISON:  CT head 11/16/2019.  MRI head 11/14/2019 FINDINGS: Brain: Large calcified meningioma right cavernous sinus unchanged with surrounding vasogenic edema. 9 mm midline shift to the left unchanged. Negative for hydrocephalus. Right MCA infarct was identified on recent MRI. There are patchy areas of hypodensity in the right frontal cortex over the convexity and in the right occipital parietal lobe which show restricted diffusion. Additional  smaller areas of restricted diffusion in the right basal ganglia show ill-defined patchy hypodensity on CT. No acute hemorrhage. No change from recent CT. Vascular: Negative for hyperdense vessel. Atherosclerotic calcification in the distal vertebral arteries and the cavernous carotid bilaterally. Skull: Negative Sinuses/Orbits: Mild mucosal edema paranasal sinuses. Negative orbit Other: None IMPRESSION: Subacute infarct right MCA territory is best identified on recent MRI. There are areas of hypodensity in the right frontal lobe and right occipital parietal lobe and right basal ganglia, unchanged from the recent CT. These areas show restricted diffusion on MRI. No acute hemorrhage Large right cavernous calcified meningioma surrounding vasogenic edema unchanged. 9 mm midline shift to the left unchanged. Electronically Signed   By: Franchot Gallo M.D.   On: 11/22/2019 10:31    Neuro Interventional Radiology - Cerebral Angiogram - Dr Estanislado Pandy 11/19/19 S.P 4 vessel cerebral artrriogram RT radial approach. Findings. 1. High grade stenosis of approximately 75 % associated with a soft smooth plaque. 2. Approx 60 % stenosis of RT VA prox and 60 % 4f Lt VA origin. 3. Mass effect on RT MCA secondary to the large  sphenoid wing meningioma without stenosis or intraluminal filling defects. 4. Straightening of RT ICA supraclinoid seg due to encasement by the meningioma in the  Right cavernous sinus. 5. Dural venous sinuses patent.  Neuro Interventional Radiology - Intervention - Dr Estanislado Pandy 11/21/19  S/P RT ICA revascularization with stent assisted angioplasty with prox flow arrest. Post procedure CT brain no ICH. Mass effect from the large calcified menigioma. Patient extubated. Denies any H/As N/V  Pupils 56mm RT = Lt sluggish. Moves RT arm and leg to command. Lt sided weakness unchanged. RT groin soft. Distal pulses dopplerable.   PHYSICAL EXAM     Temp:  [97.6 F (36.4 C)-99.1 F (37.3 C)] 99.1 F  (37.3 C) (10/01 0000) Pulse Rate:  [47-77] 47 (10/01 0300) Resp:  [6-22] 15 (10/01 0300) BP: (88-140)/(49-81) 105/61 (10/01 0300) SpO2:  [87 %-99 %] 94 % (10/01 0300) Arterial Line BP: (157-182)/(61-69) 157/67 (09/30 0930)  General -obese elderly African-American male not in acute distress  Ophthalmologic - fundi not visualized due to noncooperation.  Cardiovascular - Regular rhythm and rate.  No murmur or gallop  Neuro - awake alert, eyes open, follow up simple commands. Oriented to age, time and place.  No dysarthria no aphasia, able to name and repeat simple sentences. Right gaze preference, able to cross midline, left gaze paresis.  Right eye partially closed.  Eyes conjugate, but limited upper and lower gaze. 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 Left able to count fingers, intact pupillary reflex. Left facial droop. Tongue midline. RUE 5/5, RLE 3+/5.   LUE 2/5 proximal  0/5 finger movement with increased muscle tone. LLE 3-/5 proximal and 3/5 distal DF. Sensation symmetrical per pt, right FTN intact. Gait not tested.   ASSESSMENT/PLAN [name] is a 74 y.o. male with history of COPD, CHF, HTN, MI, OSA and known meningioma (from 05/2019 with R CRAO, followed by NS w/ scheduled appt for 9/23) presenting to [facility] 9/22 with L facial droop, L sided weakness and dysarthria.   Stroke: Scattered R MCA and ACA infarcts in setting of right ICA bulb and siphon stenosis and right MCA narrowing due to large R meningioma. Can not rule out embolic source.  MRI  [facility]) 9/22 R MCA and ACA scattered infarcts. Elevation of R MCA d/t mass effect. R middle cranial fossa meningioma involving R cavernous sinus and sella turcica unchanged w/ 19mm L midline shift.  Vasogenic edema on the right brain.  CTA head large R cavernous sinus meningioma extending into prepontine cistern on the R, mass enters the sella. Extensive atherosclerotic calcification B  cavernous ICA and supraclinoid ICA. Distal B VA mild stenoses. Right middle cerebral artery is up lifted by tumor.  CT - 11/16/19 -  Acute infarct right MCA territory in the right frontal parietal lobe extending into the occipital lobe is unchanged. 9 mm midline shift to the left unchanged.  Carotid Doppler nondiagnostic of right ICA due to extensive plaque  CTA neck Bulky eccentric plaque at the right bifurcation/proximal right ICA with associated stenosis of up to 65-70%  2D Echo - EF 60 - 65%. Findings concerning for MV mass - TEE recommended - Reviewed by Dr Johnsie Cancel 11/19/19 - TEE not felt to be indicated (see his note 9/27)  LDL 114  HgbA1c 5.4  VTE prophylaxis - lovenox   aspirin 81 mg daily prior to admission, now on aspirin 325 mg daily and plavix DAPT for 3 months and then plavix alone.   Therapy recommendations:  CIR recommended  Disposition:  pending   Meningioma, large  Large R cavernous area extending into prepontine cistern and sella w/ upward displacement of R MCA  Previously not a surgical candidate given R CRAO at time  of dx  Currently not a surgical candidate given acute stroke  NS following for possible future removal  Vasogenic edema on Decadron IV  Carotid stenosis Hx of R CRAO  05/2019 right eye vision loss  02/2019 CUS Bilateral carotid atherosclerosis. No hemodynamically significant ICA stenosis.   This admission CUS nondiagnostic of right ICA due to extensive plaque  CTA neck Bulky eccentric plaque at the right bifurcation/proximal right ICA with associated stenosis of up to 65-70%  Intervention - 11/21/19 - S/P RT ICA revascularization with stent assisted angioplasty with prox flow arrest.  Vasogenic edema  CT and MRI showed right brain vasogenic edema due to tumor  On decadron IV  Put on CBG and SSI  Vomiting 9/23   CT - 11/16/19 - Large right cavernous sinus meningioma unchanged. There is vasogenic edema in the right temporoparietal lobe.  9 mm midline shift to the left unchanged.  Hypertension  Home meds:  HCTZ 25 tid, imdur 120, amlodipine 10, lasix 80 bid, metoprolol 25 bid  On amlodipine 10, metoprolol 25 bid, lasix 40 bid and imdur 120 daily  On hydralazine PRN  BP stable   BP goal < 180/105 - gradually decrease BP to the long term goal  Long-term BP goal 130-150 - now with Rt carotid artery stent - Lt carotid artery only 10-20% stenosis by CTA (60% both vertebral arteries) (SBP 105 - 113 today) . Monitor BP - may need to decrease some of pt's anti-htn meds.  Hyperlipidemia  Home meds:  lipitor 40, resumed in hospital  LDL 114, goal < 70  Increase lipitor to 80  Continue statin at discharge  Tobacco abuse  Current smoker  Smoking cessation counseling provided  Pt is willing to quit     Other Stroke Risk Factors  Advanced age  Obesity, recommend weight loss, diet and exercise as appropriate   Coronary artery disease, hx MI w/ PCI x 3  Obstructive sleep apnea, on CPAP at home  Chronic diastolic Congestive heart failure  Other Active Problems  Leukocytosis WBC 8.3->15.1->13.4->11.8->12.2 (afebrile)  COPD  Bradycardia (40's) (on Lopressor 25 mg Bid home meds) - now 50's and 60's  AKI - creatinine - 1.25 - encourage po intake - on IVF fluids 75 cc / hr ->1.43 ->1.08  Labs in AM  Hospital day # 9  Continue aspirin and Brilinta for carotid stent and mobilize out of bed.  Therapy consults.  Likely transfer to inpatient rehab in a few days.  Hold off on neurosurgical treatment options for his large calcified meningioma at the present time as per neurosurgery.  Stroke team will sign off.  Kindly call for questions.  Greater than 50% time during this 25-minute visit was spent on counseling and coordination of care about his stroke and carotid stent and answering questions.  Antony Contras, MD      To contact Stroke Continuity provider, please refer to [email or web address] After hours, contact General  Neurology

## 2019-11-23 NOTE — Progress Notes (Signed)
Patient ID: Charles Sherman, male   DOB: 1945/09/18, 74 y.o.   MRN: [id number] Vital signs are stable and patient is awake and alert but densely hemiparetic on the left side.  He does move his leg a bit better than he moves his arm.  He is eating and having breakfast this morning.  I reviewed his last CT scan which again demonstrates the residual of the strokes and vasogenic edema from his tumor.  At this point I would like to decrease his Decadron to 4 mg twice daily.  We will see how he tolerates this decrease from a clinical perspective.  Would like to hold off on any surgical intervention while patient recovers from stroke if that is possible.  We will continue to follow.  Appreciate the care of Dr. Estanislado Pandy  and Dr. Leonie Man.

## 2019-11-23 NOTE — Anesthesia Postprocedure Evaluation (Signed)
Anesthesia Post Note  Patient: DESHONE LYSSY  Procedure(s) Performed: RIGHT CAROTID STENTING (Right )     Patient location during evaluation: PACU Anesthesia Type: General Level of consciousness: awake and alert Pain management: pain level controlled Vital Signs Assessment: post-procedure vital signs reviewed and stable Respiratory status: spontaneous breathing, nonlabored ventilation, respiratory function stable and patient connected to nasal cannula oxygen Cardiovascular status: blood pressure returned to baseline and stable Postop Assessment: no apparent nausea or vomiting Anesthetic complications: no   No complications documented.  Last Vitals:  Vitals:   11/23/19 0700 11/23/19 0800  BP: 130/67 126/65  Pulse: (!) 48 (!) 56  Resp: 11 16  Temp:    SpO2: 96% 96%    Last Pain:  Vitals:   11/23/19 0400  TempSrc: Oral  PainSc:                  Adrian

## 2019-11-23 NOTE — Progress Notes (Signed)
Physical Therapy Treatment Patient Details Name: Charles Sherman MRN: [id number] DOB: April 29, 1945 Today's Date: 11/23/2019    History of Present Illness 74 year old gentleman with known right medial sphenoid wing meningioma scheduled for routine outpatient follow-up after an MRI scan on the 15th.  However patient presented to the outside hospital emergency room with left-sided weakness last night work-up is revealed multiple acute infarcts right MCA distribution. PMH includes CHF, COPD, HTN, MI, OSA. Pt underwent R ICA stenting on 11/21/2019.    PT Comments    Pt tolerates treatment well, demonstrating improved sitting balance with less significant left lean. Emphasizing attention to left side during session to improve activation of LUE and LLE. Pt demonstrates a preference for anterior and left sided lean, this along with weakness and inattention places him at a high falls risk. Pt will benefit from continued acute PT POC to reduce falls risk and caregiver burden.  Follow Up Recommendations  CIR     Equipment Recommendations  Wheelchair (measurements PT);Wheelchair cushion (measurements PT);Hospital bed (mechanical lift, all if home today)    Recommendations for Other Services       Precautions / Restrictions Precautions Precautions: Fall Precaution Comments: L sided neglect Restrictions Weight Bearing Restrictions: No    Mobility  Bed Mobility Overal bed mobility: Needs Assistance Bed Mobility: Supine to Sit     Supine to sit: Max assist;HOB elevated (nearing modA)     General bed mobility comments: cues for sequencing and facilitation of LLE during transfer  Transfers Overall transfer level: Needs assistance Equipment used: 2 person hand held assist Transfers: Sit to/from Stand;Stand Pivot Transfers Sit to Stand: Max assist;+2 physical assistance Stand pivot transfers: Max assist;+2 physical assistance       General transfer comment: pt performs 2 sit to stands  and one SPT, forward trunk lean, shuffling feet with no foot clearance noted, minimal L knee buckling  Ambulation/Gait                 Stairs             Wheelchair Mobility    Modified Rankin (Stroke Patients Only) Modified Rankin (Stroke Patients Only) Pre-Morbid Rankin Score: No symptoms Modified Rankin: Severe disability     Balance Overall balance assessment: Needs assistance Sitting-balance support: Single extremity supported;Feet supported Sitting balance-Leahy Scale: Poor Sitting balance - Comments: minA due to forward or L lateral lean Postural control: Left lateral lean Standing balance support: Bilateral upper extremity supported Standing balance-Leahy Scale: Poor Standing balance comment: mod-maxA x2                            Cognition Arousal/Alertness: Awake/alert Behavior During Therapy: Flat affect Overall Cognitive Status: Impaired/Different from baseline Area of Impairment: Attention;Memory;Following commands;Safety/judgement;Awareness;Problem solving;Orientation                 Orientation Level: Disoriented to;Time Current Attention Level: Sustained Memory: Decreased recall of precautions;Decreased short-term memory Following Commands: Follows one step commands with increased time Safety/Judgement: Decreased awareness of safety;Decreased awareness of deficits Awareness: Emergent Problem Solving: Slow processing;Requires verbal cues;Requires tactile cues        Exercises General Exercises - Lower Extremity Ankle Circles/Pumps: AROM;Both;10 reps Quad Sets: AROM;Left;10 reps Gluteal Sets: AROM;10 reps;Both Other Exercises Other Exercises: facilitation of attention to L side to mobilize LUE and LLE during exercise Other Exercises: Postural cueing and neuromuscular re-education in sitting at edge of bed, min-modA to improve posture and improve left attention  General Comments General comments (skin integrity, edema,  etc.): VSS on RA      Pertinent Vitals/Pain Pain Assessment: No/denies pain    Home Living                      Prior Function            PT Goals (current goals can now be found in the care plan section) Acute Rehab PT Goals Patient Stated Goal: to go home Progress towards PT goals: Progressing toward goals    Frequency    Min 4X/week      PT Plan Current plan remains appropriate    Co-evaluation PT/OT/SLP Co-Evaluation/Treatment: Yes Reason for Co-Treatment: Complexity of the patient's impairments (multi-system involvement);Necessary to address cognition/behavior during functional activity;For patient/therapist safety;To address functional/ADL transfers PT goals addressed during session: Mobility/safety with mobility;Balance;Strengthening/ROM        AM-PAC PT "6 Clicks" Mobility   Outcome Measure  Help needed turning from your back to your side while in a flat bed without using bedrails?: A Lot Help needed moving from lying on your back to sitting on the side of a flat bed without using bedrails?: Total Help needed moving to and from a bed to a chair (including a wheelchair)?: Total Help needed standing up from a chair using your arms (e.g., wheelchair or bedside chair)?: Total Help needed to walk in hospital room?: Total Help needed climbing 3-5 steps with a railing? : Total 6 Click Score: 7    End of Session Equipment Utilized During Treatment: Gait belt Activity Tolerance: Patient tolerated treatment well Patient left: in chair;with call bell/phone within reach;with chair alarm set Nurse Communication: Mobility status;Need for lift equipment PT Visit Diagnosis: Unsteadiness on feet (R26.81);Other abnormalities of gait and mobility (R26.89);Muscle weakness (generalized) (M62.81);Other symptoms and signs involving the nervous system (R29.898);Hemiplegia and hemiparesis Hemiplegia - Right/Left: Left Hemiplegia - dominant/non-dominant:  Non-dominant Hemiplegia - caused by: Cerebral infarction     Time: 5449-2010 PT Time Calculation (min) (ACUTE ONLY): 39 min  Charges:  $Therapeutic Exercise: 8-22 mins $Therapeutic Activity: 8-22 mins                     Zenaida Niece, PT, DPT Acute Rehabilitation Pager: [phone]   Zenaida Niece 11/23/2019, 1:51 PM

## 2019-11-24 LAB — CBC
HCT: 36 % — ABNORMAL LOW (ref 39.0–52.0)
Hemoglobin: 12.4 g/dL — ABNORMAL LOW (ref 13.0–17.0)
MCH: 32.8 pg (ref 26.0–34.0)
MCHC: 34.4 g/dL (ref 30.0–36.0)
MCV: 95.2 fL (ref 80.0–100.0)
Platelets: 155 10*3/uL (ref 150–400)
RBC: 3.78 MIL/uL — ABNORMAL LOW (ref 4.22–5.81)
RDW: 13.6 % (ref 11.5–15.5)
WBC: 10.5 10*3/uL (ref 4.0–10.5)
nRBC: 0 % (ref 0.0–0.2)

## 2019-11-24 LAB — GLUCOSE, CAPILLARY
Glucose-Capillary: 119 mg/dL — ABNORMAL HIGH (ref 70–99)
Glucose-Capillary: 100 mg/dL — ABNORMAL HIGH (ref 70–99)
Glucose-Capillary: 95 mg/dL (ref 70–99)
Glucose-Capillary: 110 mg/dL — ABNORMAL HIGH (ref 70–99)

## 2019-11-24 LAB — BASIC METABOLIC PANEL
Anion gap: 9 (ref 5–15)
BUN: 34 mg/dL — ABNORMAL HIGH (ref 8–23)
CO2: 24 mmol/L (ref 22–32)
Calcium: 8.5 mg/dL — ABNORMAL LOW (ref 8.9–10.3)
Chloride: 102 mmol/L (ref 98–111)
Creatinine, Ser: 1.19 mg/dL (ref 0.61–1.24)
GFR calc Af Amer: >60 mL/min (ref >60)
GFR calc non Af Amer: 59 mL/min — ABNORMAL LOW (ref >60)
Glucose, Bld: 132 mg/dL — ABNORMAL HIGH (ref 70–99)
Potassium: 4.2 mmol/L (ref 3.5–5.1)
Sodium: 135 mmol/L (ref 135–145)

## 2019-11-24 NOTE — Progress Notes (Signed)
  NEUROSURGERY PROGRESS NOTE   Pt seen and examined. No issues overnight. Pt without complaint this am.  EXAM: Temp:  [97.6 F (36.4 C)-98.4 F (36.9 C)] 97.6 F (36.4 C) (10/02 0800) Pulse Rate:  [45-56] 51 (10/02 1400) Resp:  [10-22] 17 (10/02 1400) BP: (114-149)/(57-78) 144/78 (10/02 1300) SpO2:  [90 %-99 %] 93 % (10/02 1400) Intake/Output      10/01 0701 - 10/02 0700 10/02 0701 - 10/03 0700   P.O.     I.V. (mL/kg) 1712 (14.4) 374.9 (3.2)   Total Intake(mL/kg) 1712 (14.4) 374.9 (3.2)   Urine (mL/kg/hr) 1950 (0.7) 500 (0.5)   Total Output 1950 500   Net -238 -125.1         Awake, alert, oriented Speech dysarthric but fluent, appropriate Stable Left hemiparesis, minimal movement of LUE, 1-2/5 distal LLE   LABS: Lab Results  Component Value Date   CREATININE 1.19 11/24/2019   BUN 34 (H) 11/24/2019   NA 135 11/24/2019   K 4.2 11/24/2019   CL 102 11/24/2019   CO2 24 11/24/2019   Lab Results  Component Value Date   WBC 10.5 11/24/2019   HGB 12.4 (L) 11/24/2019   HCT 36.0 (L) 11/24/2019   MCV 95.2 11/24/2019   PLT 155 11/24/2019    IMPRESSION: - 74 y.o. male with large right sphenoid wing meningioma and likely embolic right hemispheric stroke, s/p RICA stent  PLAN: - Cont supportive care - Cont to taper decadron - Can consider treatment of meningioma after recovery from stroke

## 2019-11-25 LAB — BASIC METABOLIC PANEL
Anion gap: 5 (ref 5–15)
BUN: 29 mg/dL — ABNORMAL HIGH (ref 8–23)
CO2: 25 mmol/L (ref 22–32)
Calcium: 8.5 mg/dL — ABNORMAL LOW (ref 8.9–10.3)
Chloride: 104 mmol/L (ref 98–111)
Creatinine, Ser: 1.11 mg/dL (ref 0.61–1.24)
GFR calc Af Amer: >60 mL/min (ref >60)
GFR calc non Af Amer: >60 mL/min (ref >60)
Glucose, Bld: 124 mg/dL — ABNORMAL HIGH (ref 70–99)
Potassium: 4.3 mmol/L (ref 3.5–5.1)
Sodium: 134 mmol/L — ABNORMAL LOW (ref 135–145)

## 2019-11-25 LAB — CBC
HCT: 35.4 % — ABNORMAL LOW (ref 39.0–52.0)
Hemoglobin: 12.1 g/dL — ABNORMAL LOW (ref 13.0–17.0)
MCH: 32 pg (ref 26.0–34.0)
MCHC: 34.2 g/dL (ref 30.0–36.0)
MCV: 93.7 fL (ref 80.0–100.0)
Platelets: 152 10*3/uL (ref 150–400)
RBC: 3.78 MIL/uL — ABNORMAL LOW (ref 4.22–5.81)
RDW: 13.5 % (ref 11.5–15.5)
WBC: 11.3 10*3/uL — ABNORMAL HIGH (ref 4.0–10.5)
nRBC: 0 % (ref 0.0–0.2)

## 2019-11-25 LAB — GLUCOSE, CAPILLARY
Glucose-Capillary: 112 mg/dL — ABNORMAL HIGH (ref 70–99)
Glucose-Capillary: 115 mg/dL — ABNORMAL HIGH (ref 70–99)
Glucose-Capillary: 127 mg/dL — ABNORMAL HIGH (ref 70–99)
Glucose-Capillary: 129 mg/dL — ABNORMAL HIGH (ref 70–99)

## 2019-11-25 NOTE — Progress Notes (Signed)
  NEUROSURGERY PROGRESS NOTE     No issues overnight.  No concerns this am  EXAM:  BP (!) 144/65 (BP Location: Right Arm)   Pulse (!) 51   Temp 98.4 F (36.9 C) (Oral)   Resp 15   Ht 5\' 11"  (1.803 m)   Wt 118.8 kg   SpO2 94%   BMI 36.54 kg/m   Awake, alert, oriented  Dysarthric, but appropriate Speech fluent, appropriate  Left hemiparesis, minimal movement of LUE, 3/5 distal LLE  IMPRESSION/PLAN 74 y.o. male with large right sphenoid wing meningioma and likely embolic right hemispheric stroke, s/p RICA stent. Some improvement in LLE today - Cont supportive care - Cont to taper decadron - Can consider treatment of meningioma after recovery from stroke

## 2019-11-25 NOTE — Plan of Care (Signed)
  Problem: Education: Goal: Individualized Educational Video(s) Outcome: Progressing   Problem: Education: Goal: Knowledge of patient specific risk factors addressed and post discharge goals established will improve Outcome: Progressing   Problem: Education: Goal: Knowledge of secondary prevention will improve Outcome: Progressing   Problem: Education: Goal: Knowledge of disease or condition will improve Outcome: Progressing

## 2019-11-26 LAB — CBC
HCT: 33.2 % — ABNORMAL LOW (ref 39.0–52.0)
Hemoglobin: 11.4 g/dL — ABNORMAL LOW (ref 13.0–17.0)
MCH: 31.8 pg (ref 26.0–34.0)
MCHC: 34.3 g/dL (ref 30.0–36.0)
MCV: 92.5 fL (ref 80.0–100.0)
Platelets: 149 10*3/uL — ABNORMAL LOW (ref 150–400)
RBC: 3.59 MIL/uL — ABNORMAL LOW (ref 4.22–5.81)
RDW: 13.2 % (ref 11.5–15.5)
WBC: 11.4 10*3/uL — ABNORMAL HIGH (ref 4.0–10.5)
nRBC: 0 % (ref 0.0–0.2)

## 2019-11-26 LAB — GLUCOSE, CAPILLARY
Glucose-Capillary: 120 mg/dL — ABNORMAL HIGH (ref 70–99)
Glucose-Capillary: 125 mg/dL — ABNORMAL HIGH (ref 70–99)
Glucose-Capillary: 129 mg/dL — ABNORMAL HIGH (ref 70–99)
Glucose-Capillary: 131 mg/dL — ABNORMAL HIGH (ref 70–99)
Glucose-Capillary: 144 mg/dL — ABNORMAL HIGH (ref 70–99)

## 2019-11-26 LAB — BASIC METABOLIC PANEL
Anion gap: 6 (ref 5–15)
BUN: 29 mg/dL — ABNORMAL HIGH (ref 8–23)
CO2: 28 mmol/L (ref 22–32)
Calcium: 8.6 mg/dL — ABNORMAL LOW (ref 8.9–10.3)
Chloride: 103 mmol/L (ref 98–111)
Creatinine, Ser: 1.27 mg/dL — ABNORMAL HIGH (ref 0.61–1.24)
GFR calc Af Amer: >60 mL/min (ref >60)
GFR calc non Af Amer: 55 mL/min — ABNORMAL LOW (ref >60)
Glucose, Bld: 132 mg/dL — ABNORMAL HIGH (ref 70–99)
Potassium: 4.3 mmol/L (ref 3.5–5.1)
Sodium: 137 mmol/L (ref 135–145)

## 2019-11-26 MED ORDER — DEXAMETHASONE 2 MG PO TABS
2.0000 mg | ORAL_TABLET | Freq: Two times a day (BID) | ORAL | Status: DC
  Administered 2019-11-27 (×2): 2 mg via ORAL
  Filled 2019-11-26 (×2): qty 1

## 2019-11-26 NOTE — H&P (Signed)
Physical Medicine and Rehabilitation Admission H&P     HPI: Charles Sherman is a 74 year old right-handed male with history of diastolic congestive heart failure, hypertension, OSA on CPAP, tobacco abuse as well as 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  Per chart review lives with spouse.  1 level home 2 steps to entry.  Reportedly independent prior to admission and active.  Presented 11/14/2019 with left-sided weakness.  Cranial CT scan showed acute infarct right MCA territory in the right frontal parietal lobe extending into the occipital lobe.  No acute hemorrhage.  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  There was some vasogenic edema in the right temporal parietal lobe 9 mm midline shift to the left again unchanged.  CT angiogram of head and neck with extensive atherosclerotic calcification in the cavernous carotids bilaterally with mild stenosis.  The tumor abuts the right cavernous and supraclinoid internal carotid artery without encasement or occlusion.  Neurosurgery did review scanned no current plan for arteriography or stenting at this time.  Admission chemistries glucose 121 WBC 15,000.  Echocardiogram showed no significant regurgitation or distraction of the valve to suggest endocarditis.  Left ventricular ejection fraction 60 to 65% no wall motion abnormalities grade 1 diastolic dysfunction.  No current plan for TEE after echocardiogram reviewed by cardiology services.  Cerebral angiogram completed showing high-grade stenosis of approximately 75% associated with soft smooth plaque.  Approximate 60% stenosis of right VA proximal and 60% of left VA origin.  Mass-effect on right MCA secondary to large sphenoid wing meningioma without stenosis or intraluminal filling defect.  Patient did undergo right ICA revascularization with stent assisted angioplasty 11/21/2019.  Patient currently maintained on aspirin as well as Brilinta and  he was cleared to begin Lovenox for DVT prophylaxis.  Completing Decadron protocol.  Currently on a dysphagia #2 thin liquid diet.  Therapy evaluations completed and patient was admitted for a comprehensive rehab program.  Review of Systems  Constitutional: Negative for chills and fever.  HENT: Negative for hearing loss.   Eyes: Positive for blurred vision.  Respiratory: Negative for cough and shortness of breath.   Cardiovascular: Positive for leg swelling. Negative for chest pain and palpitations.  Gastrointestinal: Positive for constipation. Negative for heartburn, nausea and vomiting.       GERD  Genitourinary: Negative for dysuria, flank pain and hematuria.  Musculoskeletal: Positive for myalgias.  Skin: Negative for rash.  Neurological: Positive for dizziness and weakness.  All other systems reviewed and are negative.  Past Medical History:  Diagnosis Date  . CHF (congestive heart failure) ([place])   . COPD (chronic obstructive pulmonary disease) ([place])   . HTN (hypertension)   . Myocardial infarct ([place])   . OSA (obstructive sleep apnea)    on CPAP   Past Surgical History:  Procedure Laterality Date  . IR ANGIO INTRA EXTRACRAN SEL COM CAROTID INNOMINATE BILAT MOD SED  11/19/2019  . IR ANGIO VERTEBRAL SEL VERTEBRAL BILAT MOD SED  11/19/2019  . IR CT HEAD LTD  11/21/2019  . IR INTRAVSC STENT CERV CAROTID W/O EMB-PROT MOD SED INC ANGIO  11/21/2019  . IR US GUIDE VASC ACCESS RIGHT  11/19/2019  . PERCUTANEOUS CORONARY STENT INTERVENTION (PCI-S)    . RADIOLOGY WITH ANESTHESIA Right 11/21/2019   Procedure: RIGHT CAROTID STENTING;  Surgeon: Luanne Bras, MD;  Location: [facility];  Service: Radiology;  Laterality: Right;   Family History  Problem Relation Age of Onset  . Alzheimer's disease Mother   .  Heart disease Father   . Diabetes Sister   . Stomach cancer Brother    Social History:  reports that he has been smoking cigarettes. He has been smoking about 0.50 packs per day. He has  never used smokeless tobacco. No history on file for alcohol use and drug use. Allergies:  Allergies  Allergen Reactions  . Shrimp [Shellfish Allergy]    Medications Prior to Admission  Medication Sig Dispense Refill  . acetaminophen (TYLENOL) 500 MG tablet Take 500 mg by mouth every 6 (six) hours as needed for mild pain or headache.    . albuterol (VENTOLIN HFA) 108 (90 Base) MCG/ACT inhaler Inhale 2 puffs into the lungs every 6 (six) hours as needed for wheezing or shortness of breath.    Marland Kitchen amLODipine (NORVASC) 10 MG tablet Take 10 mg by mouth daily.     Marland Kitchen aspirin EC 81 MG tablet Take 81 mg by mouth daily.    Marland Kitchen atorvastatin (LIPITOR) 40 MG tablet Take 40 mg by mouth daily.    Marland Kitchen docusate sodium (COLACE) 100 MG capsule Take 100 mg by mouth daily as needed for mild constipation.    . furosemide (LASIX) 80 MG tablet Take 80 mg by mouth 2 (two) times daily.    . hydrochlorothiazide (HYDRODIURIL) 25 MG tablet Take 25 mg by mouth 3 (three) times daily. Do not take if SBP is <100    . isosorbide mononitrate (IMDUR) 120 MG 24 hr tablet Take 120 mg by mouth daily.    . lansoprazole (PREVACID) 15 MG capsule Take 15 mg by mouth daily as needed (acid reflux).     . metolazone (ZAROXOLYN) 2.5 MG tablet Take 2.5 mg by mouth once a week. On Mondays 30 minutes before furosemide. May take twice daily as needed for edema    . metoprolol tartrate (LOPRESSOR) 25 MG tablet Take 1 tablet (25 mg total) by mouth 2 (two) times daily. 180 tablet 3  . Multiple Vitamin (MULTIVITAMIN) tablet Take 1 tablet by mouth daily.    . nitroGLYCERIN (NITROSTAT) 0.4 MG SL tablet Place 0.4 mg under the tongue every 5 (five) minutes x 3 doses as needed for chest pain (If no relief after 3 rd dose report to the ED).    Marland Kitchen nystatin cream (MYCOSTATIN) Apply 1 application topically 2 (two) times daily as needed for dry skin. 15 gm    . potassium chloride (KLOR-CON) 10 MEQ tablet Take 20-30 mEq by mouth See admin instructions. Taking 2  tablets (17meg) twice daily     Except take three tablets ( 30 meq) twice day on Mondays only      Drug Regimen Review Drug regimen was reviewed and remains appropriate with no significant issues identified  Home: Home Living Family/patient expects to be discharged to:: Inpatient rehab Living Arrangements: Spouse/significant other (adult grandson) Available Help at Discharge: Family, Available 24 hours/day Type of Home: House Home Access: Stairs to enter [employer] of Steps: 2 Entrance Stairs-Rails: None Home Layout: One level, Laundry or work area in basement [employer] Shower/Tub: Chiropodist: Standard Bathroom Accessibility: Yes Home Equipment: Environmental consultant - 2 wheels, [employer] - single point Additional Comments: Adult grandson works nights; wife's sister is a Quarry manager who can assist  Lives With: Spouse, Family   Functional History: Prior Function Level of Independence: Independent Comments: pt reports he enjoys going fishing  Functional Status:  Mobility: Bed Mobility Overal bed mobility: Needs Assistance Bed Mobility: Supine to Sit Rolling: Max assist, +2 for physical assistance  Sidelying to sit: Max assist, +2 for physical assistance Supine to sit: Max assist, HOB elevated (nearing modA) Sit to supine: +2 for physical assistance, Max assist General bed mobility comments: cues for sequencing and facilitation of LLE during transfer Transfers Overall transfer level: Needs assistance Equipment used: 2 person hand held assist Transfers: Sit to/from Stand, Stand Pivot Transfers Sit to Stand: Max assist, +2 physical assistance Stand pivot transfers: Max assist, +2 physical assistance General transfer comment: pt performs 2 sit to stands and one SPT, forward trunk lean, shuffling feet with no foot clearance noted, minimal L knee buckling Ambulation/Gait General Gait Details: unable at this time    ADL: ADL Overall ADL's : Needs  assistance/impaired Eating/Feeding: Total assistance, Sitting Grooming: Min guard, Sitting Grooming Details (indicate cue type and reason): close guarding to minA for sitting balance; cues for thoroughness when washing L side of face  Upper Body Bathing: Maximal assistance Upper Body Bathing Details (indicate cue type and reason): supported sitting Lower Body Bathing: Total assistance Lower Body Bathing Details (indicate cue type and reason): Max A +2 sit<>stand Upper Body Dressing : Total assistance Upper Body Dressing Details (indicate cue type and reason): supported sitting Lower Body Dressing: Total assistance Lower Body Dressing Details (indicate cue type and reason): Max A +2 sit<>stand Toilet Transfer: Maximal assistance, +2 for physical assistance, Stand-pivot, BSC Toileting- Clothing Manipulation and Hygiene: Total assistance Toileting - Clothing Manipulation Details (indicate cue type and reason): Max A +2 sit<>stand Functional mobility during ADLs: Maximal assistance, +2 for physical assistance, +2 for safety/equipment General ADL Comments: pt remains with impaired cognition, L side inattention, decreased sitting/standing balance   Cognition: Cognition Overall Cognitive Status: Impaired/Different from baseline Arousal/Alertness: Lethargic Orientation Level: Oriented X4 Attention: Sustained Sustained Attention: Impaired Sustained Attention Impairment: Verbal basic Memory: Impaired Memory Impairment: Retrieval deficit, Decreased short term memory Decreased Short Term Memory: Verbal basic (4/4 immediate recall; 2/4 delayed; 4/4 with cues) Awareness: Impaired Awareness Impairment: Intellectual impairment Safety/Judgment: Impaired Cognition Arousal/Alertness: Awake/alert Behavior During Therapy: Flat affect Overall Cognitive Status: Impaired/Different from baseline Area of Impairment: Attention, Memory, Following commands, Safety/judgement, Awareness, Problem solving,  Orientation Orientation Level: Disoriented to, Time Current Attention Level: Sustained Memory: Decreased recall of precautions, Decreased short-term memory Following Commands: Follows one step commands with increased time Safety/Judgement: Decreased awareness of safety, Decreased awareness of deficits Awareness: Emergent Problem Solving: Slow processing, Requires verbal cues, Requires tactile cues General Comments: pt following simple commands majority of time, very delayed processing requiring max tactile and verbal cues to complete task, especially with L LE and UE  Physical Exam: Blood pressure (!) 127/57, pulse (!) 56, temperature 99 F (37.2 C), temperature source Axillary, resp. rate 17, height 5\' 11"  (1.803 m), weight 118.8 kg, SpO2 95 %. Physical Exam  General: Alert and oriented x 3, No apparent distress HEENT: Keeps right eye closed throughout exam Neck: Supple without JVD or lymphadenopathy Heart: Reg rate and rhythm. No murmurs rubs or gallops Chest: On 1 L [place], breathing comfortably Abdomen: Soft, non-tender, non-distended, bowel sounds positive. Extremities: No clubbing, cyanosis, or edema. Pulses are 2+ Skin: Clean and intact without signs of breakdown Neuro: Pt is cognitively appropriate with normal insight, memory, and awareness. Cranial nerves 2-12 are intact. Sensory exam is normal. Reflexes are 2+ in all 4's. Fine motor coordination is intact. No tremors. Motor function is grossly 5/5 on right. Has severe left sided hemiplegia- 0/5 throughout.  Musculoskeletal: Leaning toward left Psych: Pt's affect is appropriate. Pt is cooperative   Results  for orders placed or performed during the hospital encounter of 11/14/19 (from the past 48 hour(s))  Glucose, capillary     Status: Abnormal   Collection Time: 11/24/19 11:23 AM  Result Value Ref Range   Glucose-Capillary 100 (H) 70 - 99 mg/dL    Comment: Glucose reference range applies only to samples taken after fasting for  at least 8 hours.  Glucose, capillary     Status: None   Collection Time: 11/24/19  6:02 PM  Result Value Ref Range   Glucose-Capillary 95 70 - 99 mg/dL    Comment: Glucose reference range applies only to samples taken after fasting for at least 8 hours.  Glucose, capillary     Status: Abnormal   Collection Time: 11/24/19  7:59 PM  Result Value Ref Range   Glucose-Capillary 110 (H) 70 - 99 mg/dL    Comment: Glucose reference range applies only to samples taken after fasting for at least 8 hours.  Glucose, capillary     Status: Abnormal   Collection Time: 11/25/19 12:28 AM  Result Value Ref Range   Glucose-Capillary 112 (H) 70 - 99 mg/dL    Comment: Glucose reference range applies only to samples taken after fasting for at least 8 hours.  CBC     Status: Abnormal   Collection Time: 11/25/19 12:46 AM  Result Value Ref Range   WBC 11.3 (H) 4.0 - 10.5 K/uL   RBC 3.78 (L) 4.22 - 5.81 MIL/uL   Hemoglobin 12.1 (L) 13.0 - 17.0 g/dL   HCT 35.4 (L) 39 - 52 %   MCV 93.7 80.0 - 100.0 fL   MCH 32.0 26.0 - 34.0 pg   MCHC 34.2 30.0 - 36.0 g/dL   RDW 13.5 11.5 - 15.5 %   Platelets 152 150 - 400 K/uL   nRBC 0.0 0.0 - 0.2 %    Comment: Performed at [facility], [place] [address] [place], [place] [address]  Basic metabolic panel     Status: Abnormal   Collection Time: 11/25/19 12:46 AM  Result Value Ref Range   Sodium 134 (L) 135 - 145 mmol/L   Potassium 4.3 3.5 - 5.1 mmol/L   Chloride 104 98 - 111 mmol/L   CO2 25 22 - 32 mmol/L   Glucose, Bld 124 (H) 70 - 99 mg/dL    Comment: Glucose reference range applies only to samples taken after fasting for at least 8 hours.   BUN 29 (H) 8 - 23 mg/dL   Creatinine, Ser 1.11 0.61 - 1.24 mg/dL   Calcium 8.5 (L) 8.9 - 10.3 mg/dL   GFR calc non Af Amer >60 >60 mL/min   GFR calc Af Amer >60 >60 mL/min   Anion gap 5 5 - 15    Comment: Performed at [facility] [address], [place], [place] [address]  Glucose, capillary     Status: Abnormal    Collection Time: 11/25/19  6:15 AM  Result Value Ref Range   Glucose-Capillary 129 (H) 70 - 99 mg/dL    Comment: Glucose reference range applies only to samples taken after fasting for at least 8 hours.  Glucose, capillary     Status: Abnormal   Collection Time: 11/25/19 12:06 PM  Result Value Ref Range   Glucose-Capillary 115 (H) 70 - 99 mg/dL    Comment: Glucose reference range applies only to samples taken after fasting for at least 8 hours.  Glucose, capillary     Status: Abnormal  Collection Time: 11/25/19  5:53 PM  Result Value Ref Range   Glucose-Capillary 127 (H) 70 - 99 mg/dL    Comment: Glucose reference range applies only to samples taken after fasting for at least 8 hours.  Glucose, capillary     Status: Abnormal   Collection Time: 11/26/19 12:04 AM  Result Value Ref Range   Glucose-Capillary 120 (H) 70 - 99 mg/dL    Comment: Glucose reference range applies only to samples taken after fasting for at least 8 hours.  CBC     Status: Abnormal   Collection Time: 11/26/19  2:49 AM  Result Value Ref Range   WBC 11.4 (H) 4.0 - 10.5 K/uL   RBC 3.59 (L) 4.22 - 5.81 MIL/uL   Hemoglobin 11.4 (L) 13.0 - 17.0 g/dL   HCT 33.2 (L) 39 - 52 %   MCV 92.5 80.0 - 100.0 fL   MCH 31.8 26.0 - 34.0 pg   MCHC 34.3 30.0 - 36.0 g/dL   RDW 13.2 11.5 - 15.5 %   Platelets 149 (L) 150 - 400 K/uL   nRBC 0.0 0.0 - 0.2 %    Comment: Performed at [facility] [address] [place], [place] [address]  Basic metabolic panel     Status: Abnormal   Collection Time: 11/26/19  2:49 AM  Result Value Ref Range   Sodium 137 135 - 145 mmol/L   Potassium 4.3 3.5 - 5.1 mmol/L   Chloride 103 98 - 111 mmol/L   CO2 28 22 - 32 mmol/L   Glucose, Bld 132 (H) 70 - 99 mg/dL    Comment: Glucose reference range applies only to samples taken after fasting for at least 8 hours.   BUN 29 (H) 8 - 23 mg/dL   Creatinine, Ser 1.27 (H) 0.61 - 1.24 mg/dL   Calcium 8.6 (L) 8.9 - 10.3 mg/dL   GFR calc non Af Amer  55 (L) >60 mL/min   GFR calc Af Amer >60 >60 mL/min   Anion gap 6 5 - 15    Comment: Performed at [facility] [address] [place], [place] [address]  Glucose, capillary     Status: Abnormal   Collection Time: 11/26/19  6:08 AM  Result Value Ref Range   Glucose-Capillary 131 (H) 70 - 99 mg/dL    Comment: Glucose reference range applies only to samples taken after fasting for at least 8 hours.   No results found.     Medical Problem List and Plan: 1.  Left-sided weakness and dysarthria secondary to scattered right MCA and ACA infarcts in the setting of right ICA bulb and siphon stenosis and right MCA narrowing due to large right meningioma.  Status post right ICA revascularization with stent assisted angioplasty 11/21/2019  -patient may shower but incision must be covered.  -ELOS/Goals: 2-3 weeks 2.  Antithrombotics: -DVT/anticoagulation: Lovenox  -antiplatelet therapy: Aspirin 81 mg daily and Brilinta 90 mg twice daily 3. Pain Management: Well controlled 4. Mood: Provide emotional support  -antipsychotic agents: N/A 5. Neuropsych: This patient is capable of making decisions on his own behalf. 6. Skin/Wound Care: Routine skin checks 7. Fluids/Electrolytes/Nutrition: Routine in and outs with follow-up chemistries 8.  Right medial sphenoid wing meningioma.  Followed by neurosurgery.  Decadron protocol.  No current indications for surgical intervention at this time 9.  Hypertension.  Norvasc 10 mg daily, Zaroxolyn 2.5 mg 1/day on Monday, Lopressor 12.5 mg twice daily, Imdur 120 mg daily. BP is well  controlled to soft.  10.  Diastolic congestive heart failure.  Lasix 40 mg twice daily.  Monitor for any signs of fluid overload 11.  Hyperlipidemia.  Lipitor 12.  History of COPD/ tobacco abuse.  Counseling.  Albuterol inhaler as needed 13.  GERD.  Protonix 14.  Dysphagia.  Dysphagia #2 thin liquids.  Follow-up speech therapy 15. Bradycardic: monitor TID.   Lavon Paganini Angiulli,  PA-C 11/26/2019   I have personally performed a face to face diagnostic evaluation, including, but not limited to relevant history and physical exam findings, of this patient and developed relevant assessment and plan.  Additionally, I have reviewed and concur with the physician assistant's documentation above.  Leeroy Cha, MD

## 2019-11-26 NOTE — Progress Notes (Signed)
Inpatient Rehabilitation Admissions Coordinator  I await bed availability to admit this patient to CIR. No bed today.  Danne Baxter, RN, MSN Rehab Admissions Coordinator [phone] 11/26/2019 10:29 AM

## 2019-11-26 NOTE — Progress Notes (Signed)
Physical Therapy Treatment Patient Details Name: Charles Sherman MRN: [id number] DOB: January 19, 1946 Today's Date: 11/26/2019    History of Present Illness 74 year old gentleman with known right medial sphenoid wing meningioma scheduled for routine outpatient follow-up after an MRI scan on the 15th.  However patient presented to the outside hospital emergency room with left-sided weakness last night work-up is revealed multiple acute infarcts right MCA distribution. PMH includes CHF, COPD, HTN, MI, OSA. Pt underwent R ICA stenting on 11/21/2019.    PT Comments    Today's skilled session continued to focus on strengthening and mobility progressing. Pt nodding off a few times with ex's during session after scooting up to [facility]. Unsafe to attempt EOB without second person assist. Session limited to strengthening ex's. Pt addressed from left side entire session to encourage left side attention.    Follow Up Recommendations  CIR     Equipment Recommendations  Wheelchair (measurements PT);Wheelchair cushion (measurements PT);Hospital bed    Recommendations for Other Services Rehab consult     Precautions / Restrictions Precautions Precautions: Fall Precaution Comments: L sided neglect Restrictions Weight Bearing Restrictions: No    Mobility  Bed Mobility Overal bed mobility: Needs Assistance             General bed mobility comments: pt able to use right UE on rail and right LE to scoot to [facility].   Modified Rankin (Stroke Patients Only) Modified Rankin (Stroke Patients Only) Pre-Morbid Rankin Score: No symptoms Modified Rankin: Severe disability     Cognition Arousal/Alertness: Awake/alert Behavior During Therapy: Flat affect;WFL for tasks assessed/performed Overall Cognitive Status: Impaired/Different from baseline Area of Impairment: Attention;Memory;Safety/judgement;Problem solving                   Current Attention Level: Sustained Memory: Decreased recall of  precautions;Decreased short-term memory Following Commands: Follows one step commands consistently Safety/Judgement: Decreased awareness of deficits;Decreased awareness of safety Awareness: Emergent Problem Solving: Difficulty sequencing;Requires verbal cues;Requires tactile cues;Slow processing General Comments: pt following simple commands,  delayed processing requiring max tactile and verbal cues to complete task with L LE and UE      Exercises General Exercises - Lower Extremity Ankle Circles/Pumps: AROM;Strengthening;Both;10 reps;Supine Quad Sets: AROM;Strengthening;Left;10 reps;Supine Heel Slides: AAROM;Strengthening;Left;10 reps;Supine Hip ABduction/ADduction: AAROM;Strengthening;Left;10 reps;Supine Straight Leg Raises: AAROM;Strengthening;Left;Supine     Pertinent Vitals/Pain Pain Assessment: No/denies pain     PT Goals (current goals can now be found in the care plan section) Acute Rehab PT Goals Patient Stated Goal: to go home PT Goal Formulation: With patient Time For Goal Achievement: 11/29/19 Potential to Achieve Goals: Fair Progress towards PT goals: Progressing toward goals    Frequency    Min 4X/week      PT Plan Current plan remains appropriate    AM-PAC PT "6 Clicks" Mobility   Outcome Measure  Help needed turning from your back to your side while in a flat bed without using bedrails?: A Lot Help needed moving from lying on your back to sitting on the side of a flat bed without using bedrails?: Total Help needed moving to and from a bed to a chair (including a wheelchair)?: Total Help needed standing up from a chair using your arms (e.g., wheelchair or bedside chair)?: Total Help needed to walk in hospital room?: Total Help needed climbing 3-5 steps with a railing? : Total 6 Click Score: 7    End of Session Equipment Utilized During Treatment: Gait belt Activity Tolerance: Patient tolerated treatment well Patient left: in bed;with call  bell/phone within reach;with bed alarm set Nurse Communication: Mobility status;Need for lift equipment PT Visit Diagnosis: Unsteadiness on feet (R26.81);Other abnormalities of gait and mobility (R26.89);Muscle weakness (generalized) (M62.81);Other symptoms and signs involving the nervous system (R29.898);Hemiplegia and hemiparesis Hemiplegia - Right/Left: Left Hemiplegia - dominant/non-dominant: Non-dominant Hemiplegia - caused by: Cerebral infarction     Time: 1040-1053 PT Time Calculation (min) (ACUTE ONLY): 13 min  Charges:  $Therapeutic Exercise: 8-22 mins                    Willow Ora, PTA, CLT Acute Rehab Services Office- [phone] 11/26/19, 11:03 AM  Willow Ora 11/26/2019, 11:02 AM

## 2019-11-26 NOTE — Progress Notes (Signed)
Providing Compassionate, Quality Care - Together  Neurosurgery Service Progress Note  Subjective: No acute events overnight. No new complaints this morning Objective: Vitals:   11/25/19 2013 11/25/19 2332 11/26/19 0322 11/26/19 0741  BP: 111/62 107/60 (!) 127/57 137/66  Pulse: (!) 58 (!) 58 (!) 56   Resp: 15 17 17 18   Temp: 98.7 F (37.1 C) 98.9 F (37.2 C) 99 F (37.2 C) 98.4 F (36.9 C)  TempSrc: Axillary Axillary Axillary Oral  SpO2: 95% 94% 95%   Weight:      Height:       Temp (24hrs), Avg:98.6 F (37 C), Min:98.1 F (36.7 C), Max:99 F (37.2 C)  CBC Latest Ref Rng & Units 11/26/2019 11/25/2019 11/24/2019  WBC 4.0 - 10.5 K/uL 11.4(H) 11.3(H) 10.5  Hemoglobin 13.0 - 17.0 g/dL 11.4(L) 12.1(L) 12.4(L)  Hematocrit 39 - 52 % 33.2(L) 35.4(L) 36.0(L)  Platelets 150 - 400 K/uL 149(L) 152 155   BMP Latest Ref Rng & Units 11/26/2019 11/25/2019 11/24/2019  Glucose 70 - 99 mg/dL 132(H) 124(H) 132(H)  BUN 8 - 23 mg/dL 29(H) 29(H) 34(H)  Creatinine 0.61 - 1.24 mg/dL 1.27(H) 1.11 1.19  Sodium 135 - 145 mmol/L 137 134(L) 135  Potassium 3.5 - 5.1 mmol/L 4.3 4.3 4.2  Chloride 98 - 111 mmol/L 103 104 102  CO2 22 - 32 mmol/L 28 25 24   Calcium 8.9 - 10.3 mg/dL 8.6(L) 8.5(L) 8.5(L)    Intake/Output Summary (Last 24 hours) at 11/26/2019 0956 Last data filed at 11/26/2019 0867 Gross per 24 hour  Intake 650 ml  Output 1600 ml  Net -950 ml    Current Facility-Administered Medications:  .  0.9 %  sodium chloride infusion, , Intravenous, Continuous, Rinehuls, Early Chars, PA-C, Stopped at 11/21/19 [phone] .  0.9 %  sodium chloride infusion, , Intravenous, Continuous, Hodierne, Adam, MD, Last Rate: 10 mL/hr at 11/21/19 0913, New Bag at 11/21/19 1111 .  0.9 %  sodium chloride infusion, , Intravenous, Continuous, Deveshwar, Sanjeev, MD, Last Rate: 75 mL/hr at 11/24/19 1200, Rate Verify at 11/24/19 1200 .  acetaminophen (TYLENOL) tablet 650 mg, 650 mg, Oral, Q4H PRN **OR** acetaminophen (TYLENOL)  160 MG/5ML solution 650 mg, 650 mg, Per Tube, Q4H PRN **OR** acetaminophen (TYLENOL) suppository 650 mg, 650 mg, Rectal, Q4H PRN, Deveshwar, Sanjeev, MD .  albuterol (VENTOLIN HFA) 108 (90 Base) MCG/ACT inhaler 2 puff, 2 puff, Inhalation, Q6H PRN, Vallarie Mare, MD .  alum & mag hydroxide-simeth (MAALOX/MYLANTA) 200-200-20 MG/5ML suspension 15 mL, 15 mL, Oral, Q4H PRN, Vallarie Mare, MD, 15 mL at 11/16/19 1804 .  amLODipine (NORVASC) tablet 10 mg, 10 mg, Oral, Daily, Vallarie Mare, MD, 10 mg at 11/26/19 0811 .  aspirin chewable tablet 81 mg, 81 mg, Oral, Daily, 81 mg at 11/26/19 0811 **OR** aspirin chewable tablet 81 mg, 81 mg, Per Tube, Daily, Deveshwar, Sanjeev, MD .  atorvastatin (LIPITOR) tablet 80 mg, 80 mg, Oral, q1800, Vallarie Mare, MD, 80 mg at 11/25/19 1747 .  chlorhexidine (PERIDEX) 0.12 % solution 15 mL, 15 mL, Mouth Rinse, BID, Vallarie Mare, MD, 15 mL at 11/26/19 0811 .  Chlorhexidine Gluconate Cloth 2 % PADS 6 each, 6 each, Topical, Daily, Vallarie Mare, MD, 6 each at 11/26/19 [phone] .  dexamethasone (DECADRON) injection 4 mg, 4 mg, Intravenous, Q12H, Kristeen Miss, MD, 4 mg at 11/26/19 0811 .  docusate sodium (COLACE) capsule 100 mg, 100 mg, Oral, Daily PRN, Vallarie Mare, MD, 100 mg at 11/25/19 1044 .  enoxaparin (LOVENOX) injection 40 mg, 40 mg, Subcutaneous, Q24H, Garvin Fila, MD, 40 mg at 11/25/19 1609 .  furosemide (LASIX) tablet 40 mg, 40 mg, Oral, BID, Vallarie Mare, MD, 40 mg at 11/26/19 0811 .  hydrALAZINE (APRESOLINE) injection 10 mg, 10 mg, Intravenous, Q6H PRN, Vallarie Mare, MD, 10 mg at 11/17/19 [phone] .  insulin aspart (novoLOG) injection 0-9 Units, 0-9 Units, Subcutaneous, Q6H, Vallarie Mare, MD, 1 Units at 11/26/19 0631 .  isosorbide mononitrate (IMDUR) 24 hr tablet 120 mg, 120 mg, Oral, Daily, Vallarie Mare, MD, 120 mg at 11/26/19 5638 .  MEDLINE mouth rinse, 15 mL, Mouth Rinse, q12n4p, Vallarie Mare, MD, 15  mL at 11/25/19 1610 .  metolazone (ZAROXOLYN) tablet 2.5 mg, 2.5 mg, Oral, Once per day on Mon, Thomas, Jonathan G, MD, 2.5 mg at 11/26/19 0745 .  metoprolol tartrate (LOPRESSOR) tablet 12.5 mg, 12.5 mg, Oral, BID, Leonie Man, Pramod S, MD, 12.5 mg at 11/26/19 9373 .  multivitamin with minerals tablet 1 tablet, 1 tablet, Oral, Daily, Vallarie Mare, MD, 1 tablet at 11/26/19 [phone] .  nitroGLYCERIN (NITROSTAT) SL tablet 0.4 mg, 0.4 mg, Sublingual, Q5 Min x 3 PRN, Vallarie Mare, MD .  ondansetron [facility]) injection 4 mg, 4 mg, Intravenous, Q6H PRN, Vallarie Mare, MD, 4 mg at 11/16/19 1307 .  pantoprazole (PROTONIX) EC tablet 40 mg, 40 mg, Oral, Daily, Vallarie Mare, MD, 40 mg at 11/26/19 0811 .  potassium chloride (KLOR-CON) CR tablet 10 mEq, 10 mEq, Oral, BID, Vallarie Mare, MD, 10 mEq at 11/26/19 0811 .  ticagrelor (BRILINTA) tablet 90 mg, 90 mg, Oral, BID, 90 mg at 11/26/19 0812 **OR** ticagrelor (BRILINTA) tablet 90 mg, 90 mg, Per Tube, BID, Luanne Bras, MD   Physical Exam: AOx3, speech fluent. Minimal movement of LUE, 3/5 distal LLE, follow commands.  Assessment & Plan: 74 year old gentleman withPMHx of COPD, CHF, HTN, MI, OSA, andknown right medial sphenoid wing meningioma who presented to [facility] on [facility]/22 with left-sided weaknessand left facial droop,work-up is revealed multiple acute infarcts right MCA distribution. April, 2021-CT scan revealed a right sphenoid wing meningioma on. He was supposed to follow up with neurosurgery outpatient on 9/22 to go over results of his MRI, however he had acute onset of left sided weakness and facial droop.  1. Meningioma, large -Large right cavernous area extending into prepontine cistern and sella w/upward displacement of R MCA -Currently not a surgical candidate given acute stroke -Decadron 4mg  q12 BID -Can consider treatment of meningioma after recovery from stroke -continue supportive care   Stroke:Carotid  stenosis: Right MCA and ACA infarcts and right MCA narrowing due to large R meningioma. -F/U with neurology vascular consults -CTAneck9/24/21-Bulky eccentric plaque at the right bifurcation/proximal right ICA with associated stenosis of up to 65-70% by NASCET criteria.Mild atheromatous change about the left carotid bifurcation/proximal left ICA with no more than mild 10-20% stenosis by NASCET criteria. -CThead- 11/16/19 - Acute infarct right MCA territory in the right frontal parietal lobeextending into the occipital lobe is unchanged. 9 mm midline shift to theleft unchanged. -TEE revealed: mitral annular calcification with some nobular thickening of mitral leaflets -DAPT: aspirin 325 mg daily and ticagrelor 90 mg BID -Angiogram(9/27)shows severe proximal right ICA stenosis from a calcific plaque with a soft plaque component. Approx. 60% stenosis of RT VA prox and 60% of left VA origin. -s/p right ICA revascularization with stent assisted angioplasty 9/29.  -DVT ppx: Enoxaparin 40 mg q24 hr, SCDs  3.Hypertension -BP controlled at this point, around 120s -On amlodipine 10 mg, metoprolol 25 bid,furosemide40 bid and isosorbide mononitrate120 mgdaily - On hydralazine PRN -BP stable, long term BP goal 130-150s- now with Rt carotid artery stent   4. AKI -Cr level: 1.27 (today)), 1.11 (10/3) -Encourge PO intake, will continue to monitor -continue 75cc/hr continuous -BMP daily -I &O appropriate    Osie Cheeks 11/26/19 9:56 AM

## 2019-11-27 LAB — GLUCOSE, CAPILLARY
Glucose-Capillary: 105 mg/dL — ABNORMAL HIGH (ref 70–99)
Glucose-Capillary: 114 mg/dL — ABNORMAL HIGH (ref 70–99)
Glucose-Capillary: 128 mg/dL — ABNORMAL HIGH (ref 70–99)
Glucose-Capillary: 175 mg/dL — ABNORMAL HIGH (ref 70–99)

## 2019-11-27 NOTE — Progress Notes (Addendum)
Occupational Therapy Treatment Patient Details Name: Charles Sherman MRN: [id number] DOB: 06-11-1945 Today's Date: 11/27/2019    History of present illness 74 year old gentleman with known right medial sphenoid wing meningioma scheduled for routine outpatient follow-up after an MRI scan on the 15th.  However patient presented to the outside hospital emergency room with left-sided weakness last night work-up is revealed multiple acute infarcts right MCA distribution. PMH includes CHF, COPD, HTN, MI, OSA. Pt underwent R ICA stenting on 11/21/2019.   OT comments  Pt demonstrate no use of L UE during session with edema present. Pt was able to power up into standing in stedy with total +2 mod (A) with L lean favored. Pt continues to progress toward goals limited this session by decreased BP with asymptomatic response. Pt keeping R eye occluded the entire session and only using L eye. Pt closing eyes majority of session. recommendation for CIR .    Follow Up Recommendations  CIR    Equipment Recommendations  None recommended by OT    Recommendations for Other Services Rehab consult    Precautions / Restrictions Precautions Precautions: Fall Precaution Comments: L sided neglect       Mobility Bed Mobility Overal bed mobility: Needs Assistance Bed Mobility: Supine to Sit Rolling: Max assist;+2 for physical assistance Sidelying to sit: Max assist;+2 for physical assistance   Sit to supine: +2 for physical assistance;Max assist   General bed mobility comments: Pt using rt extremities to assist; return to sidelying he was able to assist with lifting each leg up onto bed  Transfers Overall transfer level: Needs assistance   Transfers: Sit to/from Stand Sit to Stand: +2 physical assistance;Mod assist;From elevated surface         General transfer comment: pt performs 2 sit to stands with stedy; initial attempt stood more erect with left lean and noted incontinent of bowels;  returned to sit and then stood second time for cleaning (pt stood ~90-120 sec) and returned to bed for completion of pericare.     Balance Overall balance assessment: Needs assistance Sitting-balance support: Single extremity supported;Feet supported Sitting balance-Leahy Scale: Poor Sitting balance - Comments: Pt requires min A for balance.  he demonstrates Lt lateral lean requiring cues to shift to midline.   Postural control: Left lateral lean Standing balance support: Single extremity supported (LUE in "sling" (sheet) due to flaccid; RUE on stedy bar) Standing balance-Leahy Scale: Poor Standing balance comment: mod-maxA x2- facilitation Lt ischium and left ribs                           ADL either performed or assessed with clinical judgement   ADL Overall ADL's : Needs assistance/impaired                                             Vision       Perception     Praxis      Cognition Arousal/Alertness: Awake/alert (cannot open rt eye) Behavior During Therapy: Flat affect;WFL for tasks assessed/performed Overall Cognitive Status: Impaired/Different from baseline Area of Impairment: Attention;Memory;Problem solving;Awareness                 Orientation Level:  (NT) Current Attention Level: Sustained Memory: Decreased recall of precautions;Decreased short-term memory Following Commands: Follows one step commands consistently   Awareness: Intellectual Problem Solving: Difficulty  sequencing;Requires verbal cues;Requires tactile cues;Slow processing General Comments: decr awareness of Lt side; can assist to correct left lean and then loses awareness and slowly drifts to his left again        Exercises Exercises: Other exercises;General Lower Extremity General Exercises - Lower Extremity Ankle Circles/Pumps: AROM;Strengthening;Both;Supine;AAROM;5 reps (followed by 30 sec heel cord stretch on left) Heel Slides:  AAROM;Strengthening;Left;Supine (resisted extension) Hip ABduction/ADduction: AAROM;Strengthening;Left;Supine (hooklying)   Shoulder Instructions       General Comments      Pertinent Vitals/ Pain       Pain Assessment: Faces Faces Pain Scale: No hurt  Home Living                                          Prior Functioning/Environment              Frequency  Min 2X/week        Progress Toward Goals  OT Goals(current goals can now be found in the care plan section)  Progress towards OT goals: Progressing toward goals  Acute Rehab OT Goals Patient Stated Goal: none stated but willing to get oob OT Goal Formulation: With patient Time For Goal Achievement: 11/30/19 Potential to Achieve Goals: Good ADL Goals Pt Will Perform Grooming: with min assist Pt Will Transfer to Toilet: with min assist;stand pivot transfer;bedside commode Additional ADL Goal #1: Pt will be S to sit EOB for basic ADLs and in prep for transfers Additional ADL Goal #2: Pt will be able to locate 3/5 objects on tray in front of him Additional ADL Goal #3: Pt will be Min A to roll to the left to A with basic ADLs  Plan Discharge plan remains appropriate    Co-evaluation    PT/OT/SLP Co-Evaluation/Treatment: Yes Reason for Co-Treatment: Complexity of the patient's impairments (multi-system involvement);For patient/therapist safety;Other (comment)   OT goals addressed during session: ADL's and self-care;Proper use of Adaptive equipment and DME      AM-PAC OT "6 Clicks" Daily Activity     Outcome Measure   Help from another person eating meals?: A Lot Help from another person taking care of personal grooming?: A Lot Help from another person toileting, which includes using toliet, bedpan, or urinal?: A Lot Help from another person bathing (including washing, rinsing, drying)?: A Lot Help from another person to put on and taking off regular upper body clothing?: A Lot Help from  another person to put on and taking off regular lower body clothing?: Total 6 Click Score: 11    End of Session Equipment Utilized During Treatment: Gait belt  OT Visit Diagnosis: Other abnormalities of gait and mobility (R26.89);Hemiplegia and hemiparesis Hemiplegia - Right/Left: Left Hemiplegia - dominant/non-dominant: Non-Dominant Hemiplegia - caused by: Cerebral infarction   Activity Tolerance Patient tolerated treatment well   Patient Left in bed;with call bell/phone within reach;with bed alarm set   Nurse Communication Mobility status;Precautions        Time: 9147-8295 OT Time Calculation (min): 39 min  Charges: OT General Charges $OT Visit: 1 Visit OT Treatments $Self Care/Home Management : 8-22 mins   Brynn, OTR/L  Acute Rehabilitation Services Pager: [phone] Office: [phone] .    Jeri Modena 11/27/2019, 4:07 PM

## 2019-11-27 NOTE — TOC Initial Note (Signed)
Transition of Care [facility]) - Initial/Assessment Note    Patient Details  Name: Charles Sherman MRN: [id number] Date of Birth: Jul 19, 1945  Transition of Care [facility]) CM/SW Contact:    Geralynn Ochs, LCSW Phone Number: 11/27/2019, 1:21 PM  Clinical Narrative:     CSW notified by CIR that there are no beds available for patient today or tomorrow. CSW met with patient to discuss and offer other IR facilities, including [facility], and patient was agreeable but said to call his wife. CSW spoke with wife and then sister-in-law via phone to discuss other options. Wife deferred to sister-in-law, Claudine, who is the [facility] for both the patient and his wife. CSW explained other options for inpatient rehab to Claudine, and gave contact information for her to research. Claudine also asked about SNF, and CSW explained that they could also be looked into but the rehabilitation at IR would be better, and that is the recommendation. Claudine asked for time to review and get back to CSW with decision. CSW sent referral information to [facility] IR for them to review in the interim. CSW asked MD about medical readiness, in case there is a bed available somewhere today. CSW to follow.              Expected Discharge Plan: IP Rehab Facility Barriers to Discharge: Continued Medical Work up   Patient Goals and CMS Choice Patient states their goals for this hospitalization and ongoing recovery are:: get rehab and back home as soon as possible CMS Medicare.gov Compare Post Acute Care list provided to:: Patient Represenative (must comment) Choice offered to / list presented to : [place] / Guardian  Expected Discharge Plan and Services Expected Discharge Plan: [place]     Post Acute Care Choice: IP Rehab Living arrangements for the past 2 months: Single Family Home                                      Prior Living Arrangements/Services Living arrangements for the past 2  months: Single Family Home Lives with:: Spouse Patient language and need for interpreter reviewed:: No Do you feel safe going back to the place where you live?: Yes      Need for Family Participation in Patient Care: Yes (Comment) Care giver support system in place?: No (comment)   Criminal Activity/Legal Involvement Pertinent to Current Situation/Hospitalization: No - Comment as needed  Activities of Daily Living   ADL Screening (condition at time of admission) Patient's cognitive ability adequate to safely complete daily activities?: Yes Is the patient deaf or have difficulty hearing?: No Does the patient have difficulty seeing, even when wearing glasses/contacts?: Yes Does the patient have difficulty concentrating, remembering, or making decisions?: No Patient able to express need for assistance with ADLs?: Yes Does the patient have difficulty dressing or bathing?: Yes Independently performs ADLs?: No Communication: Independent Dressing (OT): Needs assistance Is this a change from baseline?: Change from baseline, expected to last >3 days Grooming: Needs assistance Is this a change from baseline?: Change from baseline, expected to last >3 days Feeding: Needs assistance Is this a change from baseline?: Change from baseline, expected to last >3 days Bathing: Needs assistance Is this a change from baseline?: Change from baseline, expected to last >3 days Toileting: Needs assistance Is this a change from baseline?: Change from baseline, expected to last >3days In/Out Bed: Needs assistance Is  this a change from baseline?: Change from baseline, expected to last >3 days Walks in Home: Needs assistance Is this a change from baseline?: Change from baseline, expected to last >3 days Does the patient have difficulty walking or climbing stairs?: Yes Weakness of Legs: Left Weakness of Arms/Hands: Left  Permission Sought/Granted Permission sought to share information with : Facility  Sport and exercise psychologist, Family Supports Permission granted to share information with : Yes, Verbal Permission Granted  Share Information with NAME: Alysia Penna, Claudine  Permission granted to share info w AGENCY: IP Rehab  Permission granted to share info w Relationship: Spouse, Sister-in-law/HCPOA     Emotional Assessment Appearance:: Appears stated age Attitude/Demeanor/Rapport: Engaged Affect (typically observed): Appropriate Orientation: : Oriented to Self, Oriented to Place, Oriented to  Time, Oriented to Situation Alcohol / Substance Use: Not Applicable Psych Involvement: No (comment)  Admission diagnosis:  Meningioma ([place]) [D32.9] Stenosis of right carotid artery [I65.21] Patient Active Problem List   Diagnosis Date Noted  . 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  . Cerebral thrombosis with cerebral infarction 11/15/2019  . Meningioma ([place]) 11/14/2019   PCP:  [facility] [place], [place] - [address] [place] [address] Phone: [phone] Fax: [phone]     Social Determinants of Health (SDOH) Interventions    Readmission Risk Interventions No flowsheet data found.

## 2019-11-27 NOTE — Care Management Important Message (Signed)
Important Message  Patient Details  Name: Charles Sherman MRN: [id number] Date of Birth: [birth year]   Medicare Important Message Given:  Yes  IM Document  delivered on 11/27/2019    Orbie Pyo 11/27/2019, 12:11 PM

## 2019-11-27 NOTE — Progress Notes (Signed)
Inpatient Rehabilitation Admissions Coordinator  CIR bed is not available to admit this patient to today.  Danne Baxter, RN, MSN Rehab Admissions Coordinator [phone] 11/27/2019 11:36 AM

## 2019-11-27 NOTE — Plan of Care (Signed)
  Problem: Education: Goal: Knowledge of disease or condition will improve Outcome: Progressing Goal: Knowledge of secondary prevention will improve Outcome: Progressing Goal: Knowledge of patient specific risk factors addressed and post discharge goals established will improve Outcome: Progressing Goal: Individualized Educational Video(s) Outcome: Progressing   Problem: Coping: Goal: Will verbalize positive feelings about self Outcome: Progressing Goal: Will identify appropriate support needs Outcome: Progressing   Problem: Self-Care: Goal: Ability to participate in self-care as condition permits will improve Outcome: Progressing Goal: Verbalization of feelings and concerns over difficulty with self-care will improve Outcome: Progressing Goal: Ability to communicate needs accurately will improve Outcome: Progressing   Problem: Nutrition: Goal: Risk of aspiration will decrease Outcome: Progressing Goal: Dietary intake will improve Outcome: Progressing   Problem: Ischemic Stroke/TIA Tissue Perfusion: Goal: Complications of ischemic stroke/TIA will be minimized Outcome: Progressing   Problem: Education: Goal: Knowledge of General Education information will improve Description: Including pain rating scale, medication(s)/side effects and non-pharmacologic comfort measures Outcome: Progressing   Problem: Health Behavior/Discharge Planning: Goal: Ability to manage health-related needs will improve Outcome: Progressing   Problem: Clinical Measurements: Goal: Ability to maintain clinical measurements within normal limits will improve Outcome: Progressing Goal: Will remain free from infection Outcome: Progressing Goal: Diagnostic test results will improve Outcome: Progressing Goal: Respiratory complications will improve Outcome: Progressing Goal: Cardiovascular complication will be avoided Outcome: Progressing   Problem: Activity: Goal: Risk for activity intolerance  will decrease Outcome: Progressing   Problem: Nutrition: Goal: Adequate nutrition will be maintained Outcome: Progressing   Problem: Coping: Goal: Level of anxiety will decrease Outcome: Progressing   Problem: Elimination: Goal: Will not experience complications related to bowel motility Outcome: Progressing Goal: Will not experience complications related to urinary retention Outcome: Progressing   Problem: Pain Managment: Goal: General experience of comfort will improve Outcome: Progressing   Problem: Safety: Goal: Ability to remain free from injury will improve Outcome: Progressing   Problem: Skin Integrity: Goal: Risk for impaired skin integrity will decrease Outcome: Progressing

## 2019-11-27 NOTE — Progress Notes (Signed)
Physical Therapy Treatment Patient Details Name: Charles Sherman MRN: [id number] DOB: November 04, 1945 Today's Date: 11/27/2019    History of Present Illness 74 year old gentleman with known right medial sphenoid wing meningioma scheduled for routine outpatient follow-up after an MRI scan on the 15th.  However patient presented to the outside hospital emergency room with left-sided weakness last night work-up is revealed multiple acute infarcts right MCA distribution. PMH includes CHF, COPD, HTN, MI, OSA. Pt underwent R ICA stenting on 11/21/2019.    PT Comments    Patient easily aroused and maintained arousal (he is unable to open left eye). He followed commands appropriately with rt extremities and LLE (no movement RUE noted). Able to stand twice with stedy with +2 mod assist. Unable to pivot to chair due to incontinence of bowels and required return to bed for pericare. Patient continues to progress towards goals.     Follow Up Recommendations  CIR     Equipment Recommendations  Wheelchair (measurements PT);Wheelchair cushion (measurements PT);Hospital bed    Recommendations for Other Services       Precautions / Restrictions Precautions Precautions: Fall Precaution Comments: L sided neglect Restrictions Weight Bearing Restrictions: No    Mobility  Bed Mobility Overal bed mobility: Needs Assistance Bed Mobility: Supine to Sit Rolling: Max assist;+2 for physical assistance Sidelying to sit: Max assist;+2 for physical assistance   Sit to supine: +2 for physical assistance;Max assist   General bed mobility comments: Pt using rt extremities to assist; return to sidelying he was able to assist with lifting each leg up onto bed  Transfers Overall transfer level: Needs assistance   Transfers: Sit to/from Stand Sit to Stand: +2 physical assistance;Mod assist;From elevated surface         General transfer comment: pt performs 2 sit to stands with stedy; initial attempt stood  more erect with left lean and noted incontinent of bowels; returned to sit and then stood second time for cleaning (pt stood ~90-120 sec) and returned to bed for completion of pericare.   Ambulation/Gait             General Gait Details: unable at this time   Stairs             Wheelchair Mobility    Modified Rankin (Stroke Patients Only) Modified Rankin (Stroke Patients Only) Pre-Morbid Rankin Score: No symptoms Modified Rankin: Severe disability     Balance Overall balance assessment: Needs assistance Sitting-balance support: Single extremity supported;Feet supported Sitting balance-Leahy Scale: Poor Sitting balance - Comments: Pt requires min A for balance.  he demonstrates Lt lateral lean requiring cues to shift to midline.   Postural control: Left lateral lean Standing balance support: Single extremity supported (LUE in "sling" (sheet) due to flaccid; RUE on stedy bar) Standing balance-Leahy Scale: Poor Standing balance comment: mod-maxA x2- facilitation Lt ischium and left ribs                            Cognition Arousal/Alertness: Awake/alert (cannot open rt eye) Behavior During Therapy: Flat affect;WFL for tasks assessed/performed Overall Cognitive Status: Impaired/Different from baseline Area of Impairment: Attention;Memory;Problem solving;Awareness                 Orientation Level:  (NT) Current Attention Level: Sustained Memory: Decreased recall of precautions;Decreased short-term memory Following Commands: Follows one step commands consistently   Awareness: Intellectual Problem Solving: Difficulty sequencing;Requires verbal cues;Requires tactile cues;Slow processing General Comments: decr awareness of Lt side; can  assist to correct left lean and then loses awareness and slowly drifts to his left again      Exercises General Exercises - Lower Extremity Ankle Circles/Pumps: AROM;Strengthening;Both;Supine;AAROM;5 reps (followed by  30 sec heel cord stretch on left) Heel Slides: AAROM;Strengthening;Left;Supine (resisted extension) Hip ABduction/ADduction: AAROM;Strengthening;Left;Supine (hooklying) Other Exercises Other Exercises: left hip internal rotation stretch in supine and in rt sidelying (during pericare)    General Comments        Pertinent Vitals/Pain Pain Assessment: No/denies pain Faces Pain Scale: No hurt    Home Living                      Prior Function            PT Goals (current goals can now be found in the care plan section) Acute Rehab PT Goals Patient Stated Goal: to go home Time For Goal Achievement: 11/29/19 Potential to Achieve Goals: Fair Progress towards PT goals: Progressing toward goals    Frequency    Min 4X/week      PT Plan Current plan remains appropriate    Co-evaluation PT/OT/SLP Co-Evaluation/Treatment: Yes Reason for Co-Treatment: Complexity of the patient's impairments (multi-system involvement);For patient/therapist safety;To address functional/ADL transfers PT goals addressed during session: Mobility/safety with mobility;Balance;Strengthening/ROM        AM-PAC PT "6 Clicks" Mobility   Outcome Measure  Help needed turning from your back to your side while in a flat bed without using bedrails?: A Lot Help needed moving from lying on your back to sitting on the side of a flat bed without using bedrails?: A Lot Help needed moving to and from a bed to a chair (including a wheelchair)?: Total Help needed standing up from a chair using your arms (e.g., wheelchair or bedside chair)?: Total Help needed to walk in hospital room?: Total Help needed climbing 3-5 steps with a railing? : Total 6 Click Score: 8    End of Session Equipment Utilized During Treatment: Gait belt Activity Tolerance: Patient tolerated treatment well Patient left: in bed;with call bell/phone within reach;with bed alarm set Nurse Communication: Mobility status;Need for lift  equipment PT Visit Diagnosis: Unsteadiness on feet (R26.81);Other abnormalities of gait and mobility (R26.89);Muscle weakness (generalized) (M62.81);Other symptoms and signs involving the nervous system (R29.898);Hemiplegia and hemiparesis Hemiplegia - Right/Left: Left Hemiplegia - dominant/non-dominant: Non-dominant Hemiplegia - caused by: Cerebral infarction     Time: 5188-4166 PT Time Calculation (min) (ACUTE ONLY): 40 min  Charges:  $Neuromuscular Re-education: 23-37 mins                      Arby Barrette, PT Pager [phone]    Rexanne Mano 11/27/2019, 12:14 PM

## 2019-11-27 NOTE — Progress Notes (Signed)
  Speech Language Pathology Treatment: Dysphagia  Patient Details Name: Charles Sherman MRN: [id number] DOB: 09-05-1945 Today's Date: 11/27/2019 Time: 8144-8185 SLP Time Calculation (min) (ACUTE ONLY): 16 min  Assessment / Plan / Recommendation Clinical Impression  Pt consumed advanced trials of soft solids with thin liquids. Mastication was prolonged, although pt also takes large bites at a time.With smaller bites, he needs less time and has more efficient oral clearance. Min cues were given for smaller bites and use of liquid washes. Pt is interested in an upgrade to mechanical soft solids. Will adjust orders to reflect this, continuing with thin liquids.     HPI HPI: Pt is a 74 y.o. 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 PMH also includes: OSA, MI, HTN, COPD, CHF      SLP Plan  Continue with current plan of care       Recommendations  Diet recommendations: Dysphagia 3 (mechanical soft);Thin liquid Liquids provided via: Straw Medication Administration: Whole meds with puree Supervision: Staff to assist with self feeding Compensations: Slow rate;Small sips/bites;Lingual sweep for clearance of pocketing Postural Changes and/or Swallow Maneuvers: Seated upright 90 degrees                Oral Care Recommendations: Oral care BID Follow up Recommendations: Inpatient Rehab SLP Visit Diagnosis: Dysphagia, oral phase (R13.11) Plan: Continue with current plan of care       GO                Osie Bond., M.A. [facility] Pager [phone] Office [phone]  11/27/2019, 4:07 PM

## 2019-11-27 NOTE — Progress Notes (Signed)
Patient ID: Charles Sherman, male   DOB: 22-Oct-1945, 74 y.o.   MRN: [id number] Vital signs are stable Patient's blood sugars appear to be under good control Hemiplegia is unchanged He is essentially ready for rehabilitation Patient is eager to go Bed arrangements are being worked out for some nearby facility Appreciate the help of social work and transition of care team.

## 2019-11-28 ENCOUNTER — Other Ambulatory Visit: Payer: Self-pay

## 2019-11-28 ENCOUNTER — Inpatient Hospital Stay (HOSPITAL_COMMUNITY)
Admission: RE | Admit: 2019-11-28 | Discharge: 2019-12-20 | DRG: 057 | Disposition: A | Discharge status: Skilled Nursing Facility | Payer: Medicare Other | Source: Intra-hospital | Attending: Physical Medicine & Rehabilitation | Admitting: Physical Medicine & Rehabilitation

## 2019-11-28 ENCOUNTER — Encounter (HOSPITAL_COMMUNITY): Payer: Self-pay | Admitting: Physical Medicine & Rehabilitation

## 2019-11-28 DIAGNOSIS — K5901 Slow transit constipation: Secondary | ICD-10-CM

## 2019-11-28 DIAGNOSIS — Z800 Family history of malignant neoplasm of digestive organs: Secondary | ICD-10-CM

## 2019-11-28 DIAGNOSIS — R1311 Dysphagia, oral phase: Secondary | ICD-10-CM | POA: Diagnosis present

## 2019-11-28 DIAGNOSIS — N179 Acute kidney failure, unspecified: Secondary | ICD-10-CM | POA: Diagnosis not present

## 2019-11-28 DIAGNOSIS — G245 Blepharospasm: Secondary | ICD-10-CM | POA: Diagnosis present

## 2019-11-28 DIAGNOSIS — I69391 Dysphagia following cerebral infarction: Secondary | ICD-10-CM | POA: Diagnosis not present

## 2019-11-28 DIAGNOSIS — Z833 Family history of diabetes mellitus: Secondary | ICD-10-CM | POA: Diagnosis not present

## 2019-11-28 DIAGNOSIS — Z20822 Contact with and (suspected) exposure to covid-19: Secondary | ICD-10-CM | POA: Diagnosis present

## 2019-11-28 DIAGNOSIS — Z820 Family history of epilepsy and other diseases of the nervous system: Secondary | ICD-10-CM | POA: Diagnosis not present

## 2019-11-28 DIAGNOSIS — I69354 Hemiplegia and hemiparesis following cerebral infarction affecting left non-dominant side: Secondary | ICD-10-CM | POA: Diagnosis present

## 2019-11-28 DIAGNOSIS — Z79899 Other long term (current) drug therapy: Secondary | ICD-10-CM

## 2019-11-28 DIAGNOSIS — K219 Gastro-esophageal reflux disease without esophagitis: Secondary | ICD-10-CM | POA: Diagnosis present

## 2019-11-28 DIAGNOSIS — D696 Thrombocytopenia, unspecified: Secondary | ICD-10-CM

## 2019-11-28 DIAGNOSIS — E785 Hyperlipidemia, unspecified: Secondary | ICD-10-CM | POA: Diagnosis present

## 2019-11-28 DIAGNOSIS — I130 Hypertensive heart and chronic kidney disease with heart failure and stage 1 through stage 4 chronic kidney disease, or unspecified chronic kidney disease: Secondary | ICD-10-CM | POA: Diagnosis present

## 2019-11-28 DIAGNOSIS — Z6830 Body mass index (BMI) 30.0-30.9, adult: Secondary | ICD-10-CM

## 2019-11-28 DIAGNOSIS — I6330 Cerebral infarction due to thrombosis of unspecified cerebral artery: Secondary | ICD-10-CM | POA: Diagnosis present

## 2019-11-28 DIAGNOSIS — F17210 Nicotine dependence, cigarettes, uncomplicated: Secondary | ICD-10-CM | POA: Diagnosis present

## 2019-11-28 DIAGNOSIS — I252 Old myocardial infarction: Secondary | ICD-10-CM

## 2019-11-28 DIAGNOSIS — I63511 Cerebral infarction due to unspecified occlusion or stenosis of right middle cerebral artery: Secondary | ICD-10-CM | POA: Diagnosis present

## 2019-11-28 DIAGNOSIS — I110 Hypertensive heart disease with heart failure: Secondary | ICD-10-CM | POA: Diagnosis present

## 2019-11-28 DIAGNOSIS — Z8249 Family history of ischemic heart disease and other diseases of the circulatory system: Secondary | ICD-10-CM

## 2019-11-28 DIAGNOSIS — D320 Benign neoplasm of cerebral meninges: Secondary | ICD-10-CM | POA: Diagnosis present

## 2019-11-28 DIAGNOSIS — I69322 Dysarthria following cerebral infarction: Secondary | ICD-10-CM

## 2019-11-28 DIAGNOSIS — I69321 Dysphasia following cerebral infarction: Secondary | ICD-10-CM

## 2019-11-28 DIAGNOSIS — Z91013 Allergy to seafood: Secondary | ICD-10-CM

## 2019-11-28 DIAGNOSIS — N189 Chronic kidney disease, unspecified: Secondary | ICD-10-CM | POA: Diagnosis present

## 2019-11-28 DIAGNOSIS — J449 Chronic obstructive pulmonary disease, unspecified: Secondary | ICD-10-CM | POA: Diagnosis present

## 2019-11-28 DIAGNOSIS — I5032 Chronic diastolic (congestive) heart failure: Secondary | ICD-10-CM

## 2019-11-28 DIAGNOSIS — Z7982 Long term (current) use of aspirin: Secondary | ICD-10-CM | POA: Diagnosis not present

## 2019-11-28 DIAGNOSIS — G4733 Obstructive sleep apnea (adult) (pediatric): Secondary | ICD-10-CM | POA: Diagnosis present

## 2019-11-28 DIAGNOSIS — I10 Essential (primary) hypertension: Secondary | ICD-10-CM

## 2019-11-28 LAB — GLUCOSE, CAPILLARY
Glucose-Capillary: 94 mg/dL (ref 70–99)
Glucose-Capillary: 133 mg/dL — ABNORMAL HIGH (ref 70–99)

## 2019-11-28 MED ORDER — ADULT MULTIVITAMIN W/MINERALS CH
1.0000 | ORAL_TABLET | Freq: Every day | ORAL | Status: DC
  Administered 2019-11-29 – 2019-12-20 (×22): 1 via ORAL
  Filled 2019-11-28 (×22): qty 1

## 2019-11-28 MED ORDER — ALBUTEROL SULFATE HFA 108 (90 BASE) MCG/ACT IN AERS: 2.0000 | INHALATION_SPRAY | Freq: Four times a day (QID) | RESPIRATORY_TRACT | Status: DC | PRN

## 2019-11-28 MED ORDER — TICAGRELOR 90 MG PO TABS
90.0000 mg | ORAL_TABLET | Freq: Two times a day (BID) | ORAL | Status: DC
  Administered 2019-12-06: 90 mg
  Filled 2019-11-28 (×11): qty 1

## 2019-11-28 MED ORDER — METOPROLOL TARTRATE 12.5 MG HALF TABLET
12.5000 mg | ORAL_TABLET | Freq: Two times a day (BID) | ORAL | Status: DC
  Administered 2019-11-28: 12.5 mg via ORAL
  Filled 2019-11-28 (×3): qty 1

## 2019-11-28 MED ORDER — DOCUSATE SODIUM 100 MG PO CAPS
100.0000 mg | ORAL_CAPSULE | Freq: Every day | ORAL | Status: DC | PRN
  Administered 2019-12-12: 100 mg via ORAL
  Filled 2019-11-28: qty 1

## 2019-11-28 MED ORDER — ENOXAPARIN SODIUM 40 MG/0.4ML ~~LOC~~ SOLN: 40.0000 mg | SUBCUTANEOUS | Status: DC

## 2019-11-28 MED ORDER — POTASSIUM CHLORIDE CRYS ER 10 MEQ PO TBCR
10.0000 meq | EXTENDED_RELEASE_TABLET | Freq: Two times a day (BID) | ORAL | Status: DC
  Administered 2019-11-28 – 2019-12-20 (×44): 10 meq via ORAL
  Filled 2019-11-28 (×44): qty 1

## 2019-11-28 MED ORDER — DEXAMETHASONE 2 MG PO TABS
1.0000 mg | ORAL_TABLET | Freq: Two times a day (BID) | ORAL | Status: DC
  Administered 2019-11-28: 1 mg via ORAL
  Filled 2019-11-28: qty 1

## 2019-11-28 MED ORDER — PANTOPRAZOLE SODIUM 40 MG PO TBEC
40.0000 mg | DELAYED_RELEASE_TABLET | Freq: Every day | ORAL | Status: DC
  Administered 2019-11-29 – 2019-12-20 (×22): 40 mg via ORAL
  Filled 2019-11-28 (×23): qty 1

## 2019-11-28 MED ORDER — ACETAMINOPHEN 160 MG/5ML PO SOLN: 650.0000 mg | ORAL | Status: DC | PRN

## 2019-11-28 MED ORDER — ENOXAPARIN SODIUM 40 MG/0.4ML ~~LOC~~ SOLN
40.0000 mg | SUBCUTANEOUS | Status: DC
  Administered 2019-11-28 – 2019-12-19 (×22): 40 mg via SUBCUTANEOUS
  Filled 2019-11-28 (×22): qty 0.4

## 2019-11-28 MED ORDER — ACETAMINOPHEN 650 MG RE SUPP: 650.0000 mg | RECTAL | Status: DC | PRN

## 2019-11-28 MED ORDER — AMLODIPINE BESYLATE 10 MG PO TABS
10.0000 mg | ORAL_TABLET | Freq: Every day | ORAL | Status: DC
  Administered 2019-11-29 – 2019-12-20 (×21): 10 mg via ORAL
  Filled 2019-11-28 (×22): qty 1

## 2019-11-28 MED ORDER — DEXAMETHASONE 2 MG PO TABS
1.0000 mg | ORAL_TABLET | Freq: Two times a day (BID) | ORAL | Status: DC
  Administered 2019-11-28 – 2019-12-12 (×28): 1 mg via ORAL
  Filled 2019-11-28 (×28): qty 1

## 2019-11-28 MED ORDER — METOLAZONE 2.5 MG PO TABS
2.5000 mg | ORAL_TABLET | ORAL | Status: DC
  Administered 2019-12-03 – 2019-12-17 (×3): 2.5 mg via ORAL
  Filled 2019-11-28 (×3): qty 1

## 2019-11-28 MED ORDER — ASPIRIN 81 MG PO CHEW
81.0000 mg | CHEWABLE_TABLET | Freq: Every day | ORAL | Status: DC
  Administered 2019-11-29 – 2019-12-20 (×21): 81 mg via ORAL
  Filled 2019-11-28 (×20): qty 1

## 2019-11-28 MED ORDER — ISOSORBIDE MONONITRATE ER 30 MG PO TB24
120.0000 mg | ORAL_TABLET | Freq: Every day | ORAL | Status: DC
  Administered 2019-11-29 – 2019-12-20 (×22): 120 mg via ORAL
  Filled 2019-11-28 (×23): qty 4

## 2019-11-28 MED ORDER — FUROSEMIDE 40 MG PO TABS
40.0000 mg | ORAL_TABLET | Freq: Two times a day (BID) | ORAL | Status: DC
  Administered 2019-11-28 – 2019-12-06 (×16): 40 mg via ORAL
  Filled 2019-11-28 (×16): qty 1

## 2019-11-28 MED ORDER — NITROGLYCERIN 0.4 MG SL SUBL: 0.4000 mg | SUBLINGUAL_TABLET | SUBLINGUAL | Status: DC | PRN

## 2019-11-28 MED ORDER — ACETAMINOPHEN 325 MG PO TABS
650.0000 mg | ORAL_TABLET | ORAL | Status: DC | PRN
  Administered 2019-12-07: 650 mg via ORAL
  Filled 2019-11-28: qty 2

## 2019-11-28 MED ORDER — ATORVASTATIN CALCIUM 80 MG PO TABS
80.0000 mg | ORAL_TABLET | Freq: Every day | ORAL | Status: DC
  Administered 2019-11-28 – 2019-12-19 (×22): 80 mg via ORAL
  Filled 2019-11-28 (×22): qty 1

## 2019-11-28 MED ORDER — ASPIRIN 81 MG PO CHEW
81.0000 mg | CHEWABLE_TABLET | Freq: Every day | ORAL | Status: DC
  Administered 2019-12-06: 81 mg
  Filled 2019-11-28 (×13): qty 1

## 2019-11-28 MED ORDER — TICAGRELOR 90 MG PO TABS
90.0000 mg | ORAL_TABLET | Freq: Two times a day (BID) | ORAL | Status: DC
  Administered 2019-11-28 – 2019-12-20 (×43): 90 mg via ORAL
  Filled 2019-11-28 (×44): qty 1

## 2019-11-28 NOTE — Discharge Instructions (Signed)
Continue aspirin and Brilinta for carotid stent and mobilize out of bed. Lipitor increases to 80mg  daily. Pt will be going to inpatient rehab today at [facility].

## 2019-11-28 NOTE — Progress Notes (Signed)
Pt arrived to unit via bed, pt oriented to rehab. Call bell in reach with bed alarm on and activated.

## 2019-11-28 NOTE — Discharge Summary (Signed)
Physician Discharge Summary  Patient ID: Charles Sherman MRN: [id number] DOB/AGE: 1945-06-20 74 y.o.  Admit date: 11/14/2019 Discharge date: 11/28/2019  Admission Diagnoses: Cerebral thrombosis with cerebral infarction, meningioma, stenosis of right carotid artery.   Discharge Diagnoses:  Active Problems:   Meningioma (HCC)   Cerebral thrombosis with cerebral infarction   Stenosis of right carotid artery   Discharged Condition: good  Hospital Course:  74 year old gentleman with known right medial sphenoid wing meningioma scheduled for routine outpatient follow-up after an MRI scan on the 15th. .  Patient has been transferred here for evaluation for multiple acute infarcts right MCA distribution. We have consulted the stroke service and IR service due to scattered right MCA and ACA infarcts in setting of right ICA siphon extensive atherosclerosis and right MCA narrowing due to large right meningioma.  CTA neck shown bulky eccentric plaque at the right bifurcation/proximal right ICA with associated stenosis of up to 65-70%. Patient underwent Interventional radiology-cerebral angiogram with Dr. Estanislado Pandy 11/19/2019. RT ICA revascularization with stent assisted angioplasty on 11/21/2019. Patient is currently on ticagrelor and aspirin for anticoagulation. Blood pressure is controlled with amlodipine, metoprolol, lasix, and imdur. Long term BP goal 130s-150s. Continue statin lipitor 80 mg. Labs remained stable. Patient will be going to [facility] inpatient rehab today. Wife and family agreed with the plan. Patient still requires oxygen which he has been using at home.   Consults: Neurology stroke and IR  Discharge Exam: Blood pressure (!) 113/59, pulse (!) 50, temperature 98.1 F (36.7 C), temperature source Oral, resp. rate 17, height 5\' 11"  (1.803 m), weight 118.8 kg, SpO2 98 %. Vital signs are stable Patient is alert oriented and chipper this morning Hemiplegia on the left remains  unchanged  Disposition: Discharge disposition: [facility] Not Defined        Allergies as of 11/28/2019      Reactions   Shrimp [shellfish Allergy]       Medication List    TAKE these medications   acetaminophen 500 MG tablet Commonly known as: TYLENOL Take 500 mg by mouth every 6 (six) hours as needed for mild pain or headache.   albuterol 108 (90 Base) MCG/ACT inhaler Commonly known as: VENTOLIN HFA Inhale 2 puffs into the lungs every 6 (six) hours as needed for wheezing or shortness of breath.   amLODipine 10 MG tablet Commonly known as: NORVASC Take 10 mg by mouth daily.   aspirin EC 81 MG tablet Take 81 mg by mouth daily.   atorvastatin 40 MG tablet Commonly known as: LIPITOR Take 40 mg by mouth daily.   docusate sodium 100 MG capsule Commonly known as: COLACE Take 100 mg by mouth daily as needed for mild constipation.   furosemide 80 MG tablet Commonly known as: LASIX Take 80 mg by mouth 2 (two) times daily.   hydrochlorothiazide 25 MG tablet Commonly known as: HYDRODIURIL Take 25 mg by mouth 3 (three) times daily. Do not take if SBP is <100   isosorbide mononitrate 120 MG 24 hr tablet Commonly known as: IMDUR Take 120 mg by mouth daily.   lansoprazole 15 MG capsule Commonly known as: PREVACID Take 15 mg by mouth daily as needed (acid reflux).   metolazone 2.5 MG tablet Commonly known as: ZAROXOLYN Take 2.5 mg by mouth once a week. On Mondays 30 minutes before furosemide. May take twice daily as needed for edema   metoprolol tartrate 25 MG tablet Commonly known as: LOPRESSOR Take 1 tablet (25 mg total) by  mouth 2 (two) times daily.   multivitamin tablet Take 1 tablet by mouth daily.   nitroGLYCERIN 0.4 MG SL tablet Commonly known as: NITROSTAT Place 0.4 mg under the tongue every 5 (five) minutes x 3 doses as needed for chest pain (If no relief after 3 rd dose report to the ED).   nystatin cream Commonly known as:  MYCOSTATIN Apply 1 application topically 2 (two) times daily as needed for dry skin. 15 gm   potassium chloride 10 MEQ tablet Commonly known as: KLOR-CON Take 20-30 mEq by mouth See admin instructions. Taking 2 tablets (17meg) twice daily     Except take three tablets ( 30 meq) twice day on Mondays only        Signed: Osie Cheeks 11/28/2019, 10:12 AM

## 2019-11-28 NOTE — Progress Notes (Signed)
Inpatient Rehabilitation Medication Review by a Pharmacist  A complete drug regimen review was completed for this patient to identify any potential clinically significant medication issues.  Clinically significant medication issues were identified:  no  Check AMION for pharmacist assigned to patient if future medication questions/issues arise during this admission.   Time spent performing this drug regimen review (minutes):  10 minutes   Nicole Cella, RPh Clinical Pharmacist 11/28/2019 4:43 PM

## 2019-11-28 NOTE — TOC Transition Note (Signed)
Transition of Care [facility]) - CM/SW Discharge Note   Patient Details  Name: Charles Sherman MRN: [id number] Date of Birth: September 15, 1945  [facility] CM/SW Contact:  Pollie Friar, RN Phone Number: 11/28/2019, 10:29 AM   Clinical Narrative:    Pt is discharging to CIR today. CM signing off.   Final next level of care: IP Rehab Facility Barriers to Discharge: No Barriers Identified   Patient Goals and CMS Choice Patient states their goals for this hospitalization and ongoing recovery are:: get rehab and back home as soon as possible CMS Medicare.gov Compare Post Acute Care list provided to:: Patient Represenative (must comment) Choice offered to / list presented to : [place] / [place]  Discharge Placement                       Discharge Plan and Services     Post Acute Care Choice: IP Rehab                               Social Determinants of Health (SDOH) Interventions     Readmission Risk Interventions No flowsheet data found.

## 2019-11-28 NOTE — Progress Notes (Signed)
Cristina Gong, RN  Rehab Admission Coordinator  Physical Medicine and Rehabilitation  PMR Pre-admission      Addendum  Date of Service:  11/23/2019  2:57 PM      Related encounter: Admission (Current) from 11/14/2019 in [facility]       Show:Clear all [x] Manual[x] Template[x] Copied  Added by: [x] Cristina Gong, RN  [] Hover for details PMR Admission Coordinator Pre-Admission Assessment   Patient: Charles Sherman is an 74 y.o., male MRN: [id number] DOB: 01/08/1946 Height: 5\' 11"  (180.3 cm) Weight: 118.8 kg                                                                                                                                                  Insurance Information HMO:     PPO:      PCP:      IPA:      80/20:      OTHER:  PRIMARY: Medicare part A only      Policy#: [id number]      Subscriber: pt Benefits:  Phone #: passport one online     Name: 10/1 Eff. Date: 05/23/2004     Deduct: $1484      Out of Pocket Max: none      Life Max: none  CIR: 100%      SNF: 20 full days Outpatient: 80%     Co-Pay: 20% Home Health: 100%      Co-Pay: none DME: 805     Co-Pay: 205 Providers: pt choice  SECONDARY: [place]      Policy#: [id number]         Financial Counselor:       Phone#:    The "Data Collection Information Summary" for patients in Inpatient Rehabilitation Facilities with attached "Privacy Act [place] Records" was provided and verbally reviewed with: Patient and Family   Emergency Contact Information         Contact Information     Name Relation Home Work Mobile    Sherman,Charles Spouse [phone]        Sherman, Charles Sister     [phone]       Current Medical History  Patient Admitting Diagnosis: CVA   History of Present Illness: Charles Sherman is a 74 year old right-handed male with history of diastolic congestive heart failure, hypertension, OSA on CPAP, tobacco abuse as well as 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  Per chart review lives with spouse.  1 level home 2 steps to entry.  Reportedly independent prior to admission and active.  Presented 11/14/2019 with left-sided weakness.  Cranial CT scan showed acute infarct right MCA territory in the right frontal parietal lobe extending into the occipital lobe.  No acute hemorrhage.  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  There was some vasogenic edema  in the right temporal parietal lobe 9 mm midline shift to the left again unchanged.  CT angiogram of head and neck with extensive atherosclerotic calcification in the cavernous carotids bilaterally with mild stenosis.  The tumor abuts the right cavernous and supraclinoid internal carotid artery without encasement or occlusion.  Neurosurgery did review scanned no current plan for arteriography or stenting at this time.  Admission chemistries glucose 121 WBC 15,000.  Echocardiogram showed no significant regurgitation or distraction of the valve to suggest endocarditis.  Left ventricular ejection fraction 60 to 65% no wall motion abnormalities grade 1 diastolic dysfunction.  No current plan for TEE after echocardiogram reviewed by cardiology services.  Cerebral angiogram completed showing high-grade stenosis of approximately 75% associated with soft smooth plaque.  Approximate 60% stenosis of right VA proximal and 60% of left VA origin.  Mass-effect on right MCA secondary to large sphenoid wing meningioma without stenosis or intraluminal filling defect.  Patient did undergo right ICA revascularization with stent assisted angioplasty 11/21/2019.  Patient currently maintained on aspirin as well as Brilinta and he was cleared to begin Lovenox for DVT prophylaxis.  Completing Decadron protocol.  Currently on a dysphagia #2 thin liquid diet.    Complete NIHSS TOTAL: 12 Glasgow Coma Scale Score: 15   Past Medical History      Past Medical  History:  Diagnosis Date  . CHF (congestive heart failure) ([place])    . COPD (chronic obstructive pulmonary disease) ([place])    . HTN (hypertension)    . Myocardial infarct ([place])    . OSA (obstructive sleep apnea)      on CPAP      Family History  family history includes Alzheimer's disease in his mother; Diabetes in his sister; Heart disease in his father; Stomach cancer in his brother.   Prior Rehab/Hospitalizations:  Has the patient had prior rehab or hospitalizations prior to admission? Yes   Has the patient had major surgery during 100 days prior to admission? Yes   Current Medications    Current Facility-Administered Medications:  .  0.9 %  sodium chloride infusion, , Intravenous, Continuous, Rinehuls, Early Chars, PA-C, Last Rate: 75 mL/hr at 11/28/19 0343, New Bag at 11/28/19 0343 .  0.9 %  sodium chloride infusion, , Intravenous, Continuous, Hodierne, Adam, MD, Last Rate: 10 mL/hr at 11/21/19 0913, New Bag at 11/21/19 1111 .  0.9 %  sodium chloride infusion, , Intravenous, Continuous, Deveshwar, Sanjeev, MD, Last Rate: 75 mL/hr at 11/27/19 0016, New Bag at 11/27/19 0016 .  acetaminophen (TYLENOL) tablet 650 mg, 650 mg, Oral, Q4H PRN **OR** acetaminophen (TYLENOL) 160 MG/5ML solution 650 mg, 650 mg, Per Tube, Q4H PRN **OR** acetaminophen (TYLENOL) suppository 650 mg, 650 mg, Rectal, Q4H PRN, Deveshwar, Sanjeev, MD .  albuterol (VENTOLIN HFA) 108 (90 Base) MCG/ACT inhaler 2 puff, 2 puff, Inhalation, Q6H PRN, Vallarie Mare, MD .  alum & mag hydroxide-simeth (MAALOX/MYLANTA) 200-200-20 MG/5ML suspension 15 mL, 15 mL, Oral, Q4H PRN, Vallarie Mare, MD, 15 mL at 11/16/19 1804 .  amLODipine (NORVASC) tablet 10 mg, 10 mg, Oral, Daily, Vallarie Mare, MD, 10 mg at 11/28/19 0921 .  aspirin chewable tablet 81 mg, 81 mg, Oral, Daily, 81 mg at 11/28/19 0921 **OR** aspirin chewable tablet 81 mg, 81 mg, Per Tube, Daily, Deveshwar, Sanjeev, MD .  atorvastatin (LIPITOR) tablet 80 mg, 80 mg,  Oral, q1800, Vallarie Mare, MD, 80 mg at 11/27/19 1808 .  chlorhexidine (PERIDEX) 0.12 % solution 15 mL, 15 mL,  Mouth Rinse, BID, Vallarie Mare, MD, 15 mL at 11/27/19 2242 .  Chlorhexidine Gluconate Cloth 2 % PADS 6 each, 6 each, Topical, Daily, Vallarie Mare, MD, 6 each at 11/28/19 [phone] .  dexamethasone (DECADRON) tablet 1 mg, 1 mg, Oral, Q12H, Kristeen Miss, MD, 1 mg at 11/28/19 0920 .  docusate sodium (COLACE) capsule 100 mg, 100 mg, Oral, Daily PRN, Vallarie Mare, MD, 100 mg at 11/25/19 1044 .  enoxaparin (LOVENOX) injection 40 mg, 40 mg, Subcutaneous, Q24H, Leonie Man, Pramod S, MD, 40 mg at 11/27/19 1550 .  furosemide (LASIX) tablet 40 mg, 40 mg, Oral, BID, Vallarie Mare, MD, 40 mg at 11/28/19 0802 .  hydrALAZINE (APRESOLINE) injection 10 mg, 10 mg, Intravenous, Q6H PRN, Vallarie Mare, MD, 10 mg at 11/17/19 [phone] .  insulin aspart (novoLOG) injection 0-9 Units, 0-9 Units, Subcutaneous, Q6H, Vallarie Mare, MD, 2 Units at 11/28/19 0020 .  isosorbide mononitrate (IMDUR) 24 hr tablet 120 mg, 120 mg, Oral, Daily, Vallarie Mare, MD, 120 mg at 11/28/19 0921 .  MEDLINE mouth rinse, 15 mL, Mouth Rinse, q12n4p, Vallarie Mare, MD, 15 mL at 11/27/19 1342 .  metolazone (ZAROXOLYN) tablet 2.5 mg, 2.5 mg, Oral, Once per day on Mon, Thomas, Jonathan G, MD, 2.5 mg at 11/26/19 0745 .  metoprolol tartrate (LOPRESSOR) tablet 12.5 mg, 12.5 mg, Oral, BID, Garvin Fila, MD, 12.5 mg at 11/26/19 2141 .  multivitamin with minerals tablet 1 tablet, 1 tablet, Oral, Daily, Vallarie Mare, MD, 1 tablet at 11/28/19 [phone] .  nitroGLYCERIN (NITROSTAT) SL tablet 0.4 mg, 0.4 mg, Sublingual, Q5 Min x 3 PRN, Vallarie Mare, MD .  ondansetron [facility]) injection 4 mg, 4 mg, Intravenous, Q6H PRN, Vallarie Mare, MD, 4 mg at 11/16/19 1307 .  pantoprazole (PROTONIX) EC tablet 40 mg, 40 mg, Oral, Daily, Vallarie Mare, MD, 40 mg at 11/28/19 0921 .  potassium chloride (KLOR-CON)  CR tablet 10 mEq, 10 mEq, Oral, BID, Vallarie Mare, MD, 10 mEq at 11/28/19 0921 .  ticagrelor (BRILINTA) tablet 90 mg, 90 mg, Oral, BID, 90 mg at 11/28/19 0921 **OR** ticagrelor (BRILINTA) tablet 90 mg, 90 mg, Per Tube, BID, Luanne Bras, MD   Patients Current Diet:     Diet Order                      DIET DYS 3 Room service appropriate? Yes with Assist; Fluid consistency: Thin  Diet effective now                      Precautions / Restrictions Precautions Precautions: Fall Precaution Comments: L sided neglect Restrictions Weight Bearing Restrictions: No    Has the patient had 2 or more falls or a fall with injury in the past year?No   Prior Activity Level Limited Community (1-2x/wk): Independent without AD; driving   Prior Functional Level Prior Function Level of Independence: Independent Comments: pt reports he enjoys going fishing   Self Care: Did the patient need help bathing, dressing, using the toilet or eating?  Independent   Indoor Mobility: Did the patient need assistance with walking from room to room (with or without device)? Independent   Stairs: Did the patient need assistance with internal or external stairs (with or without device)? Independent   Functional Cognition: Did the patient need help planning regular tasks such as shopping or remembering to take medications? [place] / Equipment  Home Equipment: [place] - 2 wheels, Cane - single point   Prior Device Use: Indicate devices/aids used by the patient prior to current illness, exacerbation or injury? None of the above   Current Functional Level Cognition   Arousal/Alertness: Lethargic Overall Cognitive Status: Impaired/Different from baseline Current Attention Level: Sustained Orientation Level: Oriented X4 Following Commands: Follows one step commands consistently Safety/Judgement: Decreased awareness of deficits, Decreased awareness of safety General  Comments: decr awareness of Lt side; can assist to correct left lean and then loses awareness and slowly drifts to his left again Attention: Sustained Sustained Attention: Impaired Sustained Attention Impairment: Verbal basic Memory: Impaired Memory Impairment: Retrieval deficit, Decreased short term memory Decreased Short Term Memory: Verbal basic (4/4 immediate recall; 2/4 delayed; 4/4 with cues) Awareness: Impaired Awareness Impairment: Intellectual impairment Safety/Judgment: Impaired    Extremity Assessment (includes Sensation/Coordination)   Upper Extremity Assessment: LUE deficits/detail LUE Deficits / Details: does not activate during session. edema present no subluxation noted. positioned with sheet as sling during transfer for safety of arm LUE Sensation: decreased proprioception LUE Coordination: decreased gross motor  Lower Extremity Assessment: Defer to PT evaluation LLE Deficits / Details: grossly 4-/5 LLE Sensation: decreased proprioception     ADLs   Overall ADL's : Needs assistance/impaired Eating/Feeding: Total assistance, Sitting Grooming: Min guard, Sitting Grooming Details (indicate cue type and reason): close guarding to minA for sitting balance; cues for thoroughness when washing L side of face  Upper Body Bathing: Maximal assistance Upper Body Bathing Details (indicate cue type and reason): supported sitting Lower Body Bathing: Total assistance Lower Body Bathing Details (indicate cue type and reason): Max A +2 sit<>stand Upper Body Dressing : Total assistance Upper Body Dressing Details (indicate cue type and reason): supported sitting Lower Body Dressing: Total assistance Lower Body Dressing Details (indicate cue type and reason): Max A +2 sit<>stand Toilet Transfer: Maximal assistance, +2 for physical assistance, Stand-pivot, BSC Toileting- Clothing Manipulation and Hygiene: Total assistance Toileting - Clothing Manipulation Details (indicate cue type  and reason): Max A +2 sit<>stand Functional mobility during ADLs: Maximal assistance, +2 for physical assistance, +2 for safety/equipment General ADL Comments: pt remains with impaired cognition, L side inattention, decreased sitting/standing balance      Mobility   Overal bed mobility: Needs Assistance Bed Mobility: Supine to Sit Rolling: Max assist, +2 for physical assistance Sidelying to sit: Max assist, +2 for physical assistance Supine to sit: Max assist, HOB elevated (nearing modA) Sit to supine: +2 for physical assistance, Max assist General bed mobility comments: Pt using rt extremities to assist; return to sidelying he was able to assist with lifting each leg up onto bed     Transfers   Overall transfer level: Needs assistance Equipment used: 2 person hand held assist Transfer via Lift Equipment: Stedy Transfers: Sit to/from Stand Sit to Stand: +2 physical assistance, Mod assist, From elevated surface Stand pivot transfers: Max assist, +2 physical assistance General transfer comment: pt performs 2 sit to stands with stedy; initial attempt stood more erect with left lean and noted incontinent of bowels; returned to sit and then stood second time for cleaning (pt stood ~90-120 sec) and returned to bed for completion of pericare.      Ambulation / Gait / Stairs / Wheelchair Mobility   Ambulation/Gait General Gait Details: unable at this time     Posture / Balance Dynamic Sitting Balance Sitting balance - Comments: Pt requires min A for balance.  he demonstrates Lt lateral lean requiring cues to shift  to midline.   Balance Overall balance assessment: Needs assistance Sitting-balance support: Single extremity supported, Feet supported Sitting balance-Leahy Scale: Poor Sitting balance - Comments: Pt requires min A for balance.  he demonstrates Lt lateral lean requiring cues to shift to midline.   Postural control: Left lateral lean Standing balance support: Single extremity  supported (LUE in "sling" (sheet) due to flaccid; RUE on stedy bar) Standing balance-Leahy Scale: Poor Standing balance comment: mod-maxA x2- facilitation Lt ischium and left ribs     Special needs/care consideration Visitor is wife, Charles Sherman who visits when her sister in law, Charles Sherman gets off work daily so comes in afternoon after 4 pm Patient is [place] connected with [place], [place]. Wife is requesting PCS assist through the [place] and states number to call is [phone] fax [phone]        Previous Home Environment  Living Arrangements: Spouse/significant other (adult grandson)  Lives With: Spouse, Family Available Help at Discharge: Family, Available 24 hours/day Type of Home: House Home Layout: One level, Laundry or work area in basement Home Access: Stairs to enter Entrance Stairs-Rails: None Technical brewer of Steps: 2 Bathroom Shower/Tub: Chiropodist: Standard Bathroom Accessibility: Yes How Accessible: Accessible via walker [place]: No Additional Comments: Adult grandson works nights; wife's sister is a Quarry manager who can assist Sister ,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 Other family are CNA can assist   Discharge Living Setting Plans for Discharge Living Setting: Patient's home, Lives with (comment) (wife and adult grandson) Type of Home at Discharge: House Discharge Home Layout: One level, Laundry or work area in basement Discharge Home Access: Stairs to enter Entrance Stairs-Rails: None Technical brewer of Steps: 2 Discharge Bathroom Shower/Tub: Designer, fashion/clothing: Standard Discharge Bathroom Accessibility: Yes How Accessible: Accessible via walker Does the patient have any problems obtaining your medications?: No   Social/Family/Support Systems Patient Roles: Spouse Contact Information: wife, Charles Sherman Anticipated Caregiver: wife, grandson and sister in Sports coach Anticipated Caregiver's Contact  Information: see above Caregiver Availability: 24/7 Discharge Plan Discussed with Primary Caregiver: Yes Is Caregiver In Agreement with Plan?: Yes Does Caregiver/Family have Issues with Lodging/Transportation while Pt is in Rehab?: No   Goals Patient/Family Goal for Rehab: min assist with PT, OT, and SLP Expected length of stay: [facility] 2 to 3 weeks Additional Information: [facility] phone [phone] fax [phone] (wife would like home services through the [place]) Pt/Family Agrees to Admission and willing to participate: Yes Program Orientation Provided & Reviewed with Pt/Caregiver Including Roles  & Responsibilities: Yes   Decrease burden of Care through IP rehab admission: n/a   Possible need for SNF placement upon discharge:not antiicapted   Patient Condition: This patient's medical and functional status has changed since the consult dated: 11/19/2019 in which the Rehabilitation Physician determined and documented that the patient's condition is appropriate for intensive rehabilitative care in an inpatient rehabilitation facility. See "History of Present Illness" (above) for medical update. Functional changes are: overall mod to max asisst. Patient's medical and functional status update has been discussed with the Rehabilitation physician and patient remains appropriate for inpatient rehabilitation. Will admit to inpatient rehab today.   Preadmission Screen Completed By:  Cleatrice Burke, RN, 11/28/2019 10:20 AM ______________________________________________________________________   Discussed status with Dr. Ranell Patrick on 11/28/2019 at  98 and received approval for admission today.   Admission Coordinator:  Cleatrice Burke, time 9024 Date 11/28/2019         Cosigned by: [name], MD  at 11/28/2019 10:35 AM  Revision History                               Note Details  Author Cristina Gong, RN File Time 11/28/2019 10:27 AM  Author Type Rehab Admission  Coordinator Status Addendum  Last Editor Cristina Gong, RN Service Physical Medicine and Rehabilitation

## 2019-11-28 NOTE — Progress Notes (Signed)
Inpatient Rehabilitation Admissions Coordinator  I have CIR bed available to admit patient to today. I spoke with Dr. Ellene Route and wife by phone. They are in agreement to admit. I have alerted acute team and TOC. I will make the arrangements to admit today.  Danne Baxter, RN, MSN Rehab Admissions Coordinator [phone] 11/28/2019 10:04 AM

## 2019-11-28 NOTE — Progress Notes (Signed)
[name], MD  Physician  Physical Medicine and Rehabilitation  Consult Note      Signed  Date of Service:  11/19/2019 12:55 PM      Related encounter: Admission (Current) from 11/14/2019 in [facility]      Signed      Expand All Collapse All  Show:Clear all [x] Manual[x] Template[] Copied  Added by: [x] Angiulli, Lavon Paganini, PA-C[x] Ranell Patrick, Clide Deutscher, MD  [] Hover for details          Physical Medicine and Rehabilitation Consult Reason for Consult: Left side weakness Referring Physician: Dr. Ellene Route     HPI: Charles Sherman is a 74 y.o. 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 Per chart review patient lives with spouse. 1 level home 2 steps to entry. Reportedly independent prior to admission and active. Presented 11/14/2019 with left-sided weakness. Cranial CT scan showed acute infarct right MCA territory in the right frontal parietal lobe extending into the occipital lobe. No acute hemorrhage. 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 There was some vasogenic edema in the right temporal parietal lobe 9 mm midline shift to the left again unchanged. CT angiogram of head and neck with extensive atherosclerotic calcification in the cavernous carotids bilaterally with mild stenosis. The tumor abuts the right cavernous and supraclinoid internal carotid artery without encasement or occlusion. Neurosurgery did review scans no current plan for arteriography or stenting at this time. Admission chemistries glucose 121, WBC 15,000. Echocardiogram showed no significant regurgitation or destruction of the valve to suggest endocarditis. Left ventricular ejection fraction 60 to 65% no wall motion abnormalities grade 1 diastolic dysfunction. No current plan for TEE. Cerebral angiogram completed showing high-grade  stenosis of approximately 75% associated with soft smooth plaque. Approximate 60% stenosis of right VA proximal and 60% of Lt VA origin. Mass-effect on right MCA secondary to large sphenoid wing meningioma without stenosis or intraluminal filling defect. Patient currently remains on aspirin and Plavix therapy. Subcutaneous Lovenox for DVT prophylaxis. Decadron protocol as indicated. Therapy evaluations completed with recommendations of physical medicine rehab consult.     Review of Systems  Constitutional: Negative for chills and fever.  HENT: Negative for hearing loss.   Eyes: Positive for blurred vision. Negative for double vision.  Respiratory: Negative for cough and shortness of breath.   Cardiovascular: Positive for leg swelling. Negative for chest pain and palpitations.  Gastrointestinal: Positive for constipation. Negative for heartburn, nausea and vomiting.  Genitourinary: Negative for dysuria, flank pain and hematuria.  Musculoskeletal: Positive for myalgias.  Skin: Negative for rash.  Neurological: Positive for weakness.  All other systems reviewed and are negative.       Past Medical History:  Diagnosis Date  . CHF (congestive heart failure) ([place])    . COPD (chronic obstructive pulmonary disease) ([place])    . HTN (hypertension)    . Myocardial infarct ([place])    . OSA (obstructive sleep apnea)      on CPAP         Past Surgical History:  Procedure Laterality Date  . PERCUTANEOUS CORONARY STENT INTERVENTION (PCI-S)             Family History  Problem Relation Age of Onset  . Alzheimer's disease Mother    . Heart disease Father    . Diabetes Sister    . Stomach cancer Brother      Social History:  reports that he has been smoking cigarettes. He has been smoking about 0.50 packs per day. He has never used smokeless tobacco. No history on file for alcohol use and drug use. Allergies:      Allergies  Allergen Reactions  . Shrimp [Shellfish Allergy]              Medications Prior to Admission  Medication Sig Dispense Refill  . acetaminophen (TYLENOL) 500 MG tablet Take 500 mg by mouth every 6 (six) hours as needed for mild pain or headache.      . albuterol (VENTOLIN HFA) 108 (90 Base) MCG/ACT inhaler Inhale 2 puffs into the lungs every 6 (six) hours as needed for wheezing or shortness of breath.      Marland Kitchen amLODipine (NORVASC) 10 MG tablet Take 10 mg by mouth daily.       Marland Kitchen aspirin EC 81 MG tablet Take 81 mg by mouth daily.      Marland Kitchen atorvastatin (LIPITOR) 40 MG tablet Take 40 mg by mouth daily.      Marland Kitchen docusate sodium (COLACE) 100 MG capsule Take 100 mg by mouth daily as needed for mild constipation.      . furosemide (LASIX) 80 MG tablet Take 80 mg by mouth 2 (two) times daily.      . hydrochlorothiazide (HYDRODIURIL) 25 MG tablet Take 25 mg by mouth 3 (three) times daily. Do not take if SBP is <100      . isosorbide mononitrate (IMDUR) 120 MG 24 hr tablet Take 120 mg by mouth daily.      . lansoprazole (PREVACID) 15 MG capsule Take 15 mg by mouth daily as needed (acid reflux).       . metolazone (ZAROXOLYN) 2.5 MG tablet Take 2.5 mg by mouth once a week. On Mondays 30 minutes before furosemide. May take twice daily as needed for edema      . metoprolol tartrate (LOPRESSOR) 25 MG tablet Take 1 tablet (25 mg total) by mouth 2 (two) times daily. 180 tablet 3  . Multiple Vitamin (MULTIVITAMIN) tablet Take 1 tablet by mouth daily.      . nitroGLYCERIN (NITROSTAT) 0.4 MG SL tablet Place 0.4 mg under the tongue every 5 (five) minutes x 3 doses as needed for chest pain (If no relief after 3 rd dose report to the ED).      Marland Kitchen nystatin cream (MYCOSTATIN) Apply 1 application topically 2 (two) times daily as needed for dry skin. 15 gm      . potassium chloride (KLOR-CON) 10 MEQ tablet Take 20-30 mEq by mouth See admin instructions. Taking 2 tablets (25meg) twice daily     Except take three tablets ( 30 meq) twice day on Mondays only          Home: Home  Living Family/patient expects to be discharged to:: Inpatient rehab Living Arrangements: Spouse/significant other Available Help at Discharge: Family, Available 24 hours/day Type of Home: House Home Access: Stairs to enter [employer] of Steps: 2 Entrance Stairs-Rails: None Home Layout: One level, Laundry or work area in basement [employer] Shower/Tub: Chiropodist: [place]: Environmental consultant - 2 wheels, Radio producer - single point  Functional History: Prior Function Level of Independence: Independent Comments: pt reports he enjoys going fishing Functional Status:  Mobility: Bed Mobility Overal bed mobility: Needs Assistance Bed Mobility: Supine to Sit Supine to sit: Max assist, +2 for physical assistance, HOB elevated Sit to supine: +2 for physical assistance, Max assist General bed mobility comments:  Pt needs A for both legs and trunk Transfers Overall transfer level: Needs assistance Equipment used: 2 person hand held assist Transfers: Sit to/from Stand, Stand Pivot Transfers Sit to Stand: Max assist, +2 physical assistance Stand pivot transfers: Max assist, +2 physical assistance General transfer comment: pt requires significant assistance and tactile cueing due to left lateral lean. Pt with noted L knee and LE hyperextension due to increased tone when standing. Pt unable to lift LLE to advance, instead drags LE with PT/OT facilitation of weight shift and pivot   ADL: ADL Overall ADL's : Needs assistance/impaired Eating/Feeding: Total assistance, Sitting Grooming: Wash/dry hands, Set up, Supervision/safety Grooming Details (indicate cue type and reason): supported sitting Upper Body Bathing: Maximal assistance Upper Body Bathing Details (indicate cue type and reason): supported sitting Lower Body Bathing: Total assistance Lower Body Bathing Details (indicate cue type and reason): Max A +2 sit<>stand Upper Body Dressing : Total assistance Upper  Body Dressing Details (indicate cue type and reason): supported sitting Lower Body Dressing: Total assistance Lower Body Dressing Details (indicate cue type and reason): Max A +2 sit<>stand Toilet Transfer: Maximal assistance, +2 for physical assistance, Stand-pivot Toileting- Clothing Manipulation and Hygiene: Total assistance Toileting - Clothing Manipulation Details (indicate cue type and reason): Max A +2 sit<>stand   Cognition: Cognition Overall Cognitive Status: Impaired/Different from baseline Arousal/Alertness: Lethargic Orientation Level: Oriented X4 Attention: Sustained Sustained Attention: Impaired Sustained Attention Impairment: Verbal basic Memory: Impaired Memory Impairment: Retrieval deficit, Decreased short term memory Decreased Short Term Memory: Verbal basic (4/4 immediate recall; 2/4 delayed; 4/4 with cues) Awareness: Impaired Awareness Impairment: Intellectual impairment Safety/Judgment: Impaired Cognition Arousal/Alertness: Awake/alert Behavior During Therapy: Flat affect Overall Cognitive Status: Impaired/Different from baseline Area of Impairment: Attention, Memory, Following commands, Safety/judgement, Awareness, Problem solving Orientation Level: Disoriented to, Place Current Attention Level: Sustained Memory: Decreased recall of precautions, Decreased short-term memory Following Commands: Follows one step commands with increased time Safety/Judgement: Decreased awareness of safety, Decreased awareness of deficits Awareness: Intellectual Problem Solving: Slow processing, Decreased initiation, Difficulty sequencing, Requires verbal cues, Requires tactile cues   Blood pressure (!) 150/66, pulse (!) 52, temperature 97.7 F (36.5 C), temperature source Axillary, resp. rate 14, SpO2 91 %. Physical Exam General: Alert, No apparent distress HEENT: Eyes are jaundiced, blurry vision Neck: Supple without JVD or lymphadenopathy Heart: Reg rate and rhythm. No  murmurs rubs or gallops Chest: CTA bilaterally without wheezes, rales, or rhonchi; no distress Abdomen: Soft, non-tender, non-distended, bowel sounds positive. Extremities: No clubbing, cyanosis, or edema. Pulses are 2+ Skin: Clean and intact without signs of breakdown Neuro: Left facial droop, dysarthria. Patient is alert in no acute distress. Noted right gaze preference. Provides name age and date of birth. Follows simple commands. Unable to test right arm due to angiography restrictions. Left arm with no movement, but is bogged down by electrodes. Bilateral LE with at least 3/5 strength- limited by fatigue Psych: Pt's affect is appropriate. Pt is cooperative   Lab Results Last 24 Hours       Results for orders placed or performed during the hospital encounter of 11/14/19 (from the past 24 hour(s))  Glucose, capillary     Status: Abnormal    Collection Time: 11/18/19  3:11 PM  Result Value Ref Range    Glucose-Capillary 128 (H) 70 - 99 mg/dL  Glucose, capillary     Status: Abnormal    Collection Time: 11/18/19 11:37 PM  Result Value Ref Range    Glucose-Capillary 111 (H) 70 - 99 mg/dL  CBC     Status: None    Collection Time: 11/19/19  5:50 AM  Result Value Ref Range    WBC 9.7 4.0 - 10.5 K/uL    RBC 4.63 4.22 - 5.81 MIL/uL    Hemoglobin 15.0 13.0 - 17.0 g/dL    HCT 45.2 39 - 52 %    MCV 97.6 80.0 - 100.0 fL    MCH 32.4 26.0 - 34.0 pg    MCHC 33.2 30.0 - 36.0 g/dL    RDW 14.4 11.5 - 15.5 %    Platelets 169 150 - 400 K/uL    nRBC 0.0 0.0 - 0.2 %  Basic metabolic panel     Status: Abnormal    Collection Time: 11/19/19  5:50 AM  Result Value Ref Range    Sodium 140 135 - 145 mmol/L    Potassium 4.7 3.5 - 5.1 mmol/L    Chloride 109 98 - 111 mmol/L    CO2 24 22 - 32 mmol/L    Glucose, Bld 118 (H) 70 - 99 mg/dL    BUN 35 (H) 8 - 23 mg/dL    Creatinine, Ser 1.44 (H) 0.61 - 1.24 mg/dL    Calcium 9.0 8.9 - 10.3 mg/dL    GFR calc non Af Amer 47 (L) >60 mL/min    GFR calc Af Amer  54 (L) >60 mL/min    Anion gap 7 5 - 15  Glucose, capillary     Status: Abnormal    Collection Time: 11/19/19 12:35 PM  Result Value Ref Range    Glucose-Capillary 109 (H) 70 - 99 mg/dL      Imaging Results (Last 48 hours)  No results found.       Assessment/Plan: Diagnosis: Multiple right MCA acute infarcts 1. Does the need for close, 24 hr/day medical supervision in concert with the patient's rehab needs make it unreasonable for this patient to be served in a less intensive setting? Yes 2. Co-Morbidities requiring supervision/potential complications: left facial droop, dysarthria, LUE flaccidity, LLE weakness, blurry vision, jaundice, CHF, COPD, HTN, MI, OSA 3. Due to bladder management, bowel management, safety, skin/wound care, disease management, medication administration, pain management and patient education, does the patient require 24 hr/day rehab nursing? Yes 4. Does the patient require coordinated care of a physician, rehab nurse, therapy disciplines of PT, OT, SLP to address physical and functional deficits in the context of the above medical diagnosis(es)? Yes Addressing deficits in the following areas: balance, endurance, locomotion, strength, transferring, bowel/bladder control, bathing, dressing, feeding, grooming, toileting, cognition and psychosocial support 5. Can the patient actively participate in an intensive therapy program of at least 3 hrs of therapy per day at least 5 days per week? Yes 6. The potential for patient to make measurable gains while on inpatient rehab is good 7. Anticipated functional outcomes upon discharge from inpatient rehab are min assist  with PT, min assist with OT, modified independent with SLP. 8. Estimated rehab length of stay to reach the above functional goals is: 2-3 weeks 9. Anticipated discharge destination: Home 10. Overall Rehab/Functional Prognosis: good   RECOMMENDATIONS: This patient's condition is appropriate for continued  rehabilitative care in the following setting: CIR Patient has agreed to participate in recommended program. Yes Note that insurance prior authorization may be required for reimbursement for recommended care.   Comment: Thank you for this consult. Admission coordinator to follow.    I have personally performed a face to face diagnostic evaluation, including, but not limited to  relevant history and physical exam findings, of this patient and developed relevant assessment and plan.  Additionally, I have reviewed and concur with the physician assistant's documentation above.   Leeroy Cha, MD   Cathlyn Parsons, PA-C 11/19/2019        Revision History                     Routing History           Note Details  Author [name], MD File Time 11/19/2019  1:58 PM  Author Type Physician Status Signed  Last Editor [name], MD Service Physical Medicine and Rehabilitation

## 2019-11-28 NOTE — Progress Notes (Signed)
Patient ID: BAKER MORONTA, male   DOB: 1946-02-22, 74 y.o.   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 He will likely require this after discharge.

## 2019-11-28 NOTE — H&P (Signed)
Physical Medicine and Rehabilitation Admission H&P     HPI: Charles Sherman is a 74 year old right-handed male with history of diastolic congestive heart failure, hypertension, OSA on CPAP, tobacco abuse as well as 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  Per chart review lives with spouse.  1 level home 2 steps to entry.  Reportedly independent prior to admission and active.  Presented 11/14/2019 with left-sided weakness.  Cranial CT scan showed acute infarct right MCA territory in the right frontal parietal lobe extending into the occipital lobe.  No acute hemorrhage.  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  There was some vasogenic edema in the right temporal parietal lobe 9 mm midline shift to the left again unchanged.  CT angiogram of head and neck with extensive atherosclerotic calcification in the cavernous carotids bilaterally with mild stenosis.  The tumor abuts the right cavernous and supraclinoid internal carotid artery without encasement or occlusion.  Neurosurgery did review scanned no current plan for arteriography or stenting at this time.  Admission chemistries glucose 121 WBC 15,000.  Echocardiogram showed no significant regurgitation or distraction of the valve to suggest endocarditis.  Left ventricular ejection fraction 60 to 65% no wall motion abnormalities grade 1 diastolic dysfunction.  No current plan for TEE after echocardiogram reviewed by cardiology services.  Cerebral angiogram completed showing high-grade stenosis of approximately 75% associated with soft smooth plaque.  Approximate 60% stenosis of right VA proximal and 60% of left VA origin.  Mass-effect on right MCA secondary to large sphenoid wing meningioma without stenosis or intraluminal filling defect.  Patient did undergo right ICA revascularization with stent assisted angioplasty 11/21/2019.  Patient currently maintained on aspirin as well as Brilinta and  he was cleared to begin Lovenox for DVT prophylaxis.  Completing Decadron protocol.  Currently on a dysphagia #2 thin liquid diet.  Therapy evaluations completed and patient was admitted for a comprehensive rehab program.  Review of Systems  Constitutional: Negative for chills and fever.  HENT: Negative for hearing loss.   Eyes: Positive for blurred vision.  Respiratory: Negative for cough and shortness of breath.   Cardiovascular: Positive for leg swelling. Negative for chest pain and palpitations.  Gastrointestinal: Positive for constipation. Negative for heartburn, nausea and vomiting.       GERD  Genitourinary: Negative for dysuria, flank pain and hematuria.  Musculoskeletal: Positive for myalgias.  Skin: Negative for rash.  Neurological: Positive for dizziness and weakness.  All other systems reviewed and are negative.  Past Medical History:  Diagnosis Date  . CHF (congestive heart failure) ([place])   . COPD (chronic obstructive pulmonary disease) ([place])   . HTN (hypertension)   . Myocardial infarct ([place])   . OSA (obstructive sleep apnea)    on CPAP   Past Surgical History:  Procedure Laterality Date  . IR ANGIO INTRA EXTRACRAN SEL COM CAROTID INNOMINATE BILAT MOD SED  11/19/2019  . IR ANGIO VERTEBRAL SEL VERTEBRAL BILAT MOD SED  11/19/2019  . IR CT HEAD LTD  11/21/2019  . IR INTRAVSC STENT CERV CAROTID W/O EMB-PROT MOD SED INC ANGIO  11/21/2019  . IR US GUIDE VASC ACCESS RIGHT  11/19/2019  . PERCUTANEOUS CORONARY STENT INTERVENTION (PCI-S)    . RADIOLOGY WITH ANESTHESIA Right 11/21/2019   Procedure: RIGHT CAROTID STENTING;  Surgeon: Luanne Bras, MD;  Location: [facility];  Service: Radiology;  Laterality: Right;   Family History  Problem Relation Age of Onset  . Alzheimer's disease Mother   .  Heart disease Father   . Diabetes Sister   . Stomach cancer Brother    Social History:  reports that he has been smoking cigarettes. He has been smoking about 0.50 packs per day. He has  never used smokeless tobacco. No history on file for alcohol use and drug use. Allergies:  Allergies  Allergen Reactions  . Shrimp [Shellfish Allergy]    Medications Prior to Admission  Medication Sig Dispense Refill  . acetaminophen (TYLENOL) 500 MG tablet Take 500 mg by mouth every 6 (six) hours as needed for mild pain or headache.    . albuterol (VENTOLIN HFA) 108 (90 Base) MCG/ACT inhaler Inhale 2 puffs into the lungs every 6 (six) hours as needed for wheezing or shortness of breath.    Marland Kitchen amLODipine (NORVASC) 10 MG tablet Take 10 mg by mouth daily.     Marland Kitchen aspirin EC 81 MG tablet Take 81 mg by mouth daily.    Marland Kitchen atorvastatin (LIPITOR) 40 MG tablet Take 40 mg by mouth daily.    Marland Kitchen docusate sodium (COLACE) 100 MG capsule Take 100 mg by mouth daily as needed for mild constipation.    . furosemide (LASIX) 80 MG tablet Take 80 mg by mouth 2 (two) times daily.    . hydrochlorothiazide (HYDRODIURIL) 25 MG tablet Take 25 mg by mouth 3 (three) times daily. Do not take if SBP is <100    . isosorbide mononitrate (IMDUR) 120 MG 24 hr tablet Take 120 mg by mouth daily.    . lansoprazole (PREVACID) 15 MG capsule Take 15 mg by mouth daily as needed (acid reflux).     . metolazone (ZAROXOLYN) 2.5 MG tablet Take 2.5 mg by mouth once a week. On Mondays 30 minutes before furosemide. May take twice daily as needed for edema    . metoprolol tartrate (LOPRESSOR) 25 MG tablet Take 1 tablet (25 mg total) by mouth 2 (two) times daily. 180 tablet 3  . Multiple Vitamin (MULTIVITAMIN) tablet Take 1 tablet by mouth daily.    . nitroGLYCERIN (NITROSTAT) 0.4 MG SL tablet Place 0.4 mg under the tongue every 5 (five) minutes x 3 doses as needed for chest pain (If no relief after 3 rd dose report to the ED).    Marland Kitchen nystatin cream (MYCOSTATIN) Apply 1 application topically 2 (two) times daily as needed for dry skin. 15 gm    . potassium chloride (KLOR-CON) 10 MEQ tablet Take 20-30 mEq by mouth See admin instructions. Taking 2  tablets (34meg) twice daily     Except take three tablets ( 30 meq) twice day on Mondays only      Drug Regimen Review Drug regimen was reviewed and remains appropriate with no significant issues identified  Home: Home Living Family/patient expects to be discharged to:: Inpatient rehab Living Arrangements: Spouse/significant other (adult grandson) Available Help at Discharge: Family, Available 24 hours/day Type of Home: House Home Access: Stairs to enter [employer] of Steps: 2 Entrance Stairs-Rails: None Home Layout: One level, Laundry or work area in basement [employer] Shower/Tub: Chiropodist: Standard Bathroom Accessibility: Yes Home Equipment: Environmental consultant - 2 wheels, [employer] - single point Additional Comments: Adult grandson works nights; wife's sister is a Quarry manager who can assist  Lives With: Spouse, Family   Functional History: Prior Function Level of Independence: Independent Comments: pt reports he enjoys going fishing  Functional Status:  Mobility: Bed Mobility Overal bed mobility: Needs Assistance Bed Mobility: Supine to Sit Rolling: Max assist, +2 for physical assistance  Sidelying to sit: Max assist, +2 for physical assistance Supine to sit: Max assist, HOB elevated (nearing modA) Sit to supine: +2 for physical assistance, Max assist General bed mobility comments: cues for sequencing and facilitation of LLE during transfer Transfers Overall transfer level: Needs assistance Equipment used: 2 person hand held assist Transfers: Sit to/from Stand, Stand Pivot Transfers Sit to Stand: Max assist, +2 physical assistance Stand pivot transfers: Max assist, +2 physical assistance General transfer comment: pt performs 2 sit to stands and one SPT, forward trunk lean, shuffling feet with no foot clearance noted, minimal L knee buckling Ambulation/Gait General Gait Details: unable at this time  ADL: ADL Overall ADL's : Needs  assistance/impaired Eating/Feeding: Total assistance, Sitting Grooming: Min guard, Sitting Grooming Details (indicate cue type and reason): close guarding to minA for sitting balance; cues for thoroughness when washing L side of face  Upper Body Bathing: Maximal assistance Upper Body Bathing Details (indicate cue type and reason): supported sitting Lower Body Bathing: Total assistance Lower Body Bathing Details (indicate cue type and reason): Max A +2 sit<>stand Upper Body Dressing : Total assistance Upper Body Dressing Details (indicate cue type and reason): supported sitting Lower Body Dressing: Total assistance Lower Body Dressing Details (indicate cue type and reason): Max A +2 sit<>stand Toilet Transfer: Maximal assistance, +2 for physical assistance, Stand-pivot, BSC Toileting- Clothing Manipulation and Hygiene: Total assistance Toileting - Clothing Manipulation Details (indicate cue type and reason): Max A +2 sit<>stand Functional mobility during ADLs: Maximal assistance, +2 for physical assistance, +2 for safety/equipment General ADL Comments: pt remains with impaired cognition, L side inattention, decreased sitting/standing balance   Cognition: Cognition Overall Cognitive Status: Impaired/Different from baseline Arousal/Alertness: Lethargic Orientation Level: Oriented X4 Attention: Sustained Sustained Attention: Impaired Sustained Attention Impairment: Verbal basic Memory: Impaired Memory Impairment: Retrieval deficit, Decreased short term memory Decreased Short Term Memory: Verbal basic (4/4 immediate recall; 2/4 delayed; 4/4 with cues) Awareness: Impaired Awareness Impairment: Intellectual impairment Safety/Judgment: Impaired Cognition Arousal/Alertness: Awake/alert Behavior During Therapy: Flat affect Overall Cognitive Status: Impaired/Different from baseline Area of Impairment: Attention, Memory, Following commands, Safety/judgement, Awareness, Problem solving,  Orientation Orientation Level: Disoriented to, Time Current Attention Level: Sustained Memory: Decreased recall of precautions, Decreased short-term memory Following Commands: Follows one step commands with increased time Safety/Judgement: Decreased awareness of safety, Decreased awareness of deficits Awareness: Emergent Problem Solving: Slow processing, Requires verbal cues, Requires tactile cues General Comments: pt following simple commands majority of time, very delayed processing requiring max tactile and verbal cues to complete task, especially with L LE and UE  Physical Exam: There were no vitals taken for this visit. Physical Exam  General: Alert and oriented x 3, No apparent distress HEENT: Keeps right eye closed throughout exam Neck: Supple without JVD or lymphadenopathy Heart: Reg rate and rhythm. No murmurs rubs or gallops Chest: On 1 L [place], breathing comfortably Abdomen: Soft, non-tender, non-distended, bowel sounds positive. Extremities: No clubbing, cyanosis, or edema. Pulses are 2+ Skin: Clean and intact without signs of breakdown Neuro: Pt is cognitively appropriate with normal insight, memory, and awareness. Cranial nerves 2-12 are intact. Sensory exam is normal. Reflexes are 2+ in all 4's. Fine motor coordination is intact. No tremors. Motor function is grossly 5/5 on right. Has severe left sided hemiplegia- 0/5 throughout.  Musculoskeletal: Leaning toward left Psych: Pt's affect is appropriate. Pt is cooperative   Results for orders placed or performed during the hospital encounter of 11/14/19 (from the past 48 hour(s))  Glucose, capillary  Status: Abnormal   Collection Time: 11/26/19  5:39 PM  Result Value Ref Range   Glucose-Capillary 129 (H) 70 - 99 mg/dL    Comment: Glucose reference range applies only to samples taken after fasting for at least 8 hours.  Glucose, capillary     Status: Abnormal   Collection Time: 11/26/19 11:40 PM  Result Value Ref Range    Glucose-Capillary 144 (H) 70 - 99 mg/dL    Comment: Glucose reference range applies only to samples taken after fasting for at least 8 hours.   Comment 1 Notify RN    Comment 2 Document in Chart   Glucose, capillary     Status: Abnormal   Collection Time: 11/27/19  6:16 AM  Result Value Ref Range   Glucose-Capillary 128 (H) 70 - 99 mg/dL    Comment: Glucose reference range applies only to samples taken after fasting for at least 8 hours.   Comment 1 Notify RN    Comment 2 Document in Chart   Glucose, capillary     Status: Abnormal   Collection Time: 11/27/19  1:36 PM  Result Value Ref Range   Glucose-Capillary 105 (H) 70 - 99 mg/dL    Comment: Glucose reference range applies only to samples taken after fasting for at least 8 hours.  Glucose, capillary     Status: Abnormal   Collection Time: 11/27/19  6:16 PM  Result Value Ref Range   Glucose-Capillary 114 (H) 70 - 99 mg/dL    Comment: Glucose reference range applies only to samples taken after fasting for at least 8 hours.  Glucose, capillary     Status: Abnormal   Collection Time: 11/27/19 11:44 PM  Result Value Ref Range   Glucose-Capillary 175 (H) 70 - 99 mg/dL    Comment: Glucose reference range applies only to samples taken after fasting for at least 8 hours.   Comment 1 Notify RN    Comment 2 Document in Chart   Glucose, capillary     Status: None   Collection Time: 11/28/19  6:21 AM  Result Value Ref Range   Glucose-Capillary 94 70 - 99 mg/dL    Comment: Glucose reference range applies only to samples taken after fasting for at least 8 hours.  Glucose, capillary     Status: Abnormal   Collection Time: 11/28/19 12:11 PM  Result Value Ref Range   Glucose-Capillary 133 (H) 70 - 99 mg/dL    Comment: Glucose reference range applies only to samples taken after fasting for at least 8 hours.   Comment 1 Notify RN    Comment 2 Document in Chart    No results found.     Medical Problem List and Plan: 1.  Left-sided  weakness and dysarthria secondary to scattered right MCA and ACA infarcts in the setting of right ICA bulb and siphon stenosis and right MCA narrowing due to large right meningioma.  Status post right ICA revascularization with stent assisted angioplasty 11/21/2019  -patient may shower but incision must be covered.  -ELOS/Goals: 2-3 weeks 2.  Antithrombotics: -DVT/anticoagulation: Lovenox  -antiplatelet therapy: Aspirin 81 mg daily and Brilinta 90 mg twice daily 3. Pain Management: Well controlled 4. Mood: Provide emotional support  -antipsychotic agents: N/A 5. Neuropsych: This patient is capable of making decisions on his own behalf. 6. Skin/Wound Care: Routine skin checks 7. Fluids/Electrolytes/Nutrition: Routine in and outs with follow-up chemistries 8.  Right medial sphenoid wing meningioma.  Followed by neurosurgery.  Decadron protocol.  No current indications  for surgical intervention at this time 9.  Hypertension.  Norvasc 10 mg daily, Zaroxolyn 2.5 mg 1/day on Monday, Lopressor 12.5 mg twice daily, Imdur 120 mg daily. BP is well controlled to soft.  10.  Diastolic congestive heart failure.  Lasix 40 mg twice daily.  Monitor for any signs of fluid overload 11.  Hyperlipidemia.  Lipitor 12.  History of COPD/ tobacco abuse.  Counseling.  Albuterol inhaler as needed 13.  GERD.  Protonix 14.  Dysphagia.  Dysphagia #2 thin liquids.  Follow-up speech therapy 15. Bradycardic: monitor TID.   Lavon Paganini Angiulli, PA-C 11/26/2019   I have personally performed a face to face diagnostic evaluation, including, but not limited to relevant history and physical exam findings, of this patient and developed relevant assessment and plan.  Additionally, I have reviewed and concur with the physician assistant's documentation above.  The patient's status has not changed. The original post admission physician evaluation remains appropriate, and any changes from the pre-admission screening or documentation  from the acute chart are noted above.   Leeroy Cha, MD

## 2019-11-29 ENCOUNTER — Inpatient Hospital Stay (HOSPITAL_COMMUNITY): Payer: No Typology Code available for payment source | Admitting: Occupational Therapy

## 2019-11-29 ENCOUNTER — Inpatient Hospital Stay (HOSPITAL_COMMUNITY): Payer: No Typology Code available for payment source | Admitting: Speech Pathology

## 2019-11-29 ENCOUNTER — Inpatient Hospital Stay (HOSPITAL_COMMUNITY): Payer: No Typology Code available for payment source | Admitting: Physical Therapy

## 2019-11-29 DIAGNOSIS — I6330 Cerebral infarction due to thrombosis of unspecified cerebral artery: Secondary | ICD-10-CM

## 2019-11-29 LAB — CBC WITH DIFFERENTIAL/PLATELET
Abs Immature Granulocytes: 0.14 10*3/uL — ABNORMAL HIGH (ref 0.00–0.07)
Basophils Absolute: 0 10*3/uL (ref 0.0–0.1)
Basophils Relative: 0 %
Eosinophils Absolute: 0.2 10*3/uL (ref 0.0–0.5)
Eosinophils Relative: 1 %
HCT: 34.9 % — ABNORMAL LOW (ref 39.0–52.0)
Hemoglobin: 12 g/dL — ABNORMAL LOW (ref 13.0–17.0)
Immature Granulocytes: 1 %
Lymphocytes Relative: 10 %
Lymphs Abs: 1.2 10*3/uL (ref 0.7–4.0)
MCH: 32.5 pg (ref 26.0–34.0)
MCHC: 34.4 g/dL (ref 30.0–36.0)
MCV: 94.6 fL (ref 80.0–100.0)
Monocytes Absolute: 0.9 10*3/uL (ref 0.1–1.0)
Monocytes Relative: 8 %
Neutro Abs: 9.5 10*3/uL — ABNORMAL HIGH (ref 1.7–7.7)
Neutrophils Relative %: 80 %
Platelets: 175 10*3/uL (ref 150–400)
RBC: 3.69 MIL/uL — ABNORMAL LOW (ref 4.22–5.81)
RDW: 13.5 % (ref 11.5–15.5)
WBC: 11.9 10*3/uL — ABNORMAL HIGH (ref 4.0–10.5)
nRBC: 0 % (ref 0.0–0.2)

## 2019-11-29 LAB — COMPREHENSIVE METABOLIC PANEL
ALT: 33 U/L (ref 0–44)
AST: 20 U/L (ref 15–41)
Albumin: 2.4 g/dL — ABNORMAL LOW (ref 3.5–5.0)
Alkaline Phosphatase: 48 U/L (ref 38–126)
Anion gap: 7 (ref 5–15)
BUN: 29 mg/dL — ABNORMAL HIGH (ref 8–23)
CO2: 30 mmol/L (ref 22–32)
Calcium: 8.6 mg/dL — ABNORMAL LOW (ref 8.9–10.3)
Chloride: 99 mmol/L (ref 98–111)
Creatinine, Ser: 1.16 mg/dL (ref 0.61–1.24)
GFR calc non Af Amer: >60 mL/min (ref >60)
Glucose, Bld: 109 mg/dL — ABNORMAL HIGH (ref 70–99)
Potassium: 4.1 mmol/L (ref 3.5–5.1)
Sodium: 136 mmol/L (ref 135–145)
Total Bilirubin: 0.6 mg/dL (ref 0.3–1.2)
Total Protein: 4.8 g/dL — ABNORMAL LOW (ref 6.5–8.1)

## 2019-11-29 MED ORDER — SULFACETAMIDE-PREDNISOLONE 10-0.2 % OP SUSP
1.0000 [drp] | OPHTHALMIC | Status: DC
  Filled 2019-11-29: qty 5

## 2019-11-29 MED ORDER — SULFACETAMIDE SODIUM 10 % OP SOLN
1.0000 [drp] | OPHTHALMIC | Status: DC
  Administered 2019-11-29 – 2019-12-12 (×75): 1 [drp] via OPHTHALMIC
  Filled 2019-11-29: qty 15

## 2019-11-29 MED ORDER — PREDNISOLONE ACETATE 1 % OP SUSP
1.0000 [drp] | OPHTHALMIC | Status: DC
  Administered 2019-11-29 – 2019-12-12 (×75): 1 [drp] via OPHTHALMIC
  Filled 2019-11-29: qty 5

## 2019-11-29 NOTE — Evaluation (Addendum)
Physical Therapy Assessment and Plan  Patient Details  Name: Charles Sherman MRN: [id number] Date of Birth: [birth year]  PT Diagnosis: Abnormal posture, Difficulty walking and Charles Sherman non-dominant Rehab Potential: Good ELOS: 4 weeks   Today's Date: 11/29/2019 PT Individual Time: 0901-0959 PT Individual Time Calculation (min): 48 min    Hospital Problem: Principal Problem:   Cerebral thrombosis with cerebral infarction Active Problems:   Right middle cerebral artery stroke [facility])   Past Medical History:  Past Medical History:  Diagnosis Date  . CHF (congestive heart Sherman) ([place])   . COPD (chronic obstructive pulmonary disease) ([place])   . HTN (hypertension)   . Myocardial infarct ([place])   . OSA (obstructive sleep apnea)    on CPAP   Past Surgical History:  Past Surgical History:  Procedure Laterality Date  . IR ANGIO INTRA EXTRACRAN SEL COM CAROTID INNOMINATE BILAT MOD SED  11/19/2019  . IR ANGIO VERTEBRAL SEL VERTEBRAL BILAT MOD SED  11/19/2019  . IR CT HEAD LTD  11/21/2019  . IR INTRAVSC STENT CERV CAROTID W/O EMB-PROT MOD SED INC ANGIO  11/21/2019  . IR US GUIDE VASC ACCESS RIGHT  11/19/2019  . PERCUTANEOUS CORONARY STENT INTERVENTION (PCI-S)    . 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  Per chart review lives with spouse.  1 level home 2 steps to entry.  Reportedly independent prior to admission and active.  Presented 11/14/2019 with left-sided weakness.  Cranial CT scan showed acute infarct right MCA territory in the right frontal parietal lobe extending into the occipital  lobe.  No acute hemorrhage.  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  There was some vasogenic edema in the right temporal parietal lobe 9 mm midline shift to the left again unchanged.  CT angiogram of head and neck with extensive atherosclerotic calcification in the cavernous carotids bilaterally with mild stenosis.  The tumor abuts the right cavernous and supraclinoid internal carotid artery without encasement or occlusion.  Neurosurgery did review scanned no current plan for arteriography or stenting at this time.  Admission chemistries glucose 121 WBC 15,000.  Echocardiogram showed no significant regurgitation or distraction of the valve to suggest endocarditis.  Left ventricular ejection fraction 60 to 65% no wall motion abnormalities grade 1 Charles dysfunction.  No current plan for TEE after echocardiogram reviewed by cardiology services.  Cerebral angiogram completed showing high-grade stenosis of approximately 75% associated with soft smooth plaque.  Approximate 60% stenosis of right VA proximal and 60% of left VA origin.  Mass-effect on right MCA secondary to large sphenoid wing meningioma without stenosis or intraluminal filling defect.  Patient did undergo right ICA revascularization with stent assisted angioplasty 11/21/2019.  Patient currently maintained on aspirin as well as Brilinta and he was cleared to begin Lovenox for DVT prophylaxis.  Completing Decadron protocol.  Currently on a dysphagia #2 thin liquid diet.  Therapy evaluations completed and patient was admitted for a comprehensive rehab program.  Patient currently requires total with mobility secondary to abnormal tone, decreased coordination and decreased motor planning and decreased sitting balance, decreased standing balance, decreased postural control and Charles Sherman.  Prior to hospitalization, patient  was independent  with mobility and lived with Spouse, Family in a House home.  Home access is 3Stairs to  enter.  Patient will benefit from skilled PT intervention to maximize safe functional mobility, minimize fall risk and decrease caregiver burden for planned discharge home with 24 hour assist.  Anticipate patient will benefit from follow up [facility] at discharge.  PT - End of Session Activity Tolerance: Tolerates 10 - 20 min activity with multiple rests Endurance Deficit: Yes PT Assessment Rehab Potential (ACUTE/IP ONLY): Good PT Barriers to Discharge: Inaccessible home environment PT Barriers to Discharge Comments: 3 STE, non-ambulatory. PT Patient demonstrates impairments in the following area(s): Balance;Safety;Endurance;Motor PT Transfers Functional Problem(s): Bed Mobility;Bed to Chair;Car;Furniture PT Locomotion Functional Problem(s): Ambulation;Stairs PT Plan PT Intensity: Minimum of 1-2 x/day ,45 to 90 minutes PT Frequency: 5 out of 7 days PT Duration Estimated Length of Stay: 4 weeks PT Treatment/Interventions: Ambulation/gait training;Discharge planning;DME/adaptive equipment instruction;Functional mobility training;Therapeutic Activities;UE/LE Strength taining/ROM;Balance/vestibular training;Neuromuscular re-education;Patient/family education;Stair training;Therapeutic Exercise;UE/LE Coordination activities;Wheelchair propulsion/positioning PT Transfers Anticipated Outcome(s): mod A PT Locomotion Anticipated Outcome(s): mod A PT Recommendation Follow Up Recommendations: Home health PT Patient destination: Home Equipment Recommended: To be determined   PT Evaluation Precautions/Restrictions Precautions Precautions: Fall Precaution Comments: L sided neglect, hard pusher to left Restrictions Weight Bearing Restrictions: No General Family/Caregiver Present: No Vital Signs Pain Pain Assessment Pain Scale: 0-10 Pain Score: 0-No pain Home Living/Prior Functioning Home Living Available Help at Discharge: Family;Available 24 hours/day Type of Home: House Home Access: Stairs to  enter [employer] of Steps: 3 Entrance Stairs-Rails: None Home Layout: One level;Laundry or work area in [employer] With: Spouse;Family Prior Function Level of Independence: Independent with transfers;Independent with gait Driving: Yes Vision/Perception     Cognition Overall Cognitive Status: Impaired/Different from baseline Arousal/Alertness: Lethargic Orientation Level: Oriented X4 Attention: Selective Sustained Attention: Appears intact Selective Attention: Impaired Selective Attention Impairment: Verbal basic Memory: Impaired Memory Impairment: Retrieval deficit;Decreased short term memory Decreased Short Term Memory: Verbal basic Awareness: Impaired Awareness Impairment: Emergent impairment Problem Solving: Impaired Problem Solving Impairment: Verbal basic;Functional basic Executive Function: Reasoning;Decision Making Reasoning: Impaired Reasoning Impairment: Functional basic Decision Making: Impaired Decision Making Impairment: Functional basic Safety/Judgment: Impaired Sensation Sensation Light Touch: Appears Intact Coordination Gross Motor Movements are Fluid and Coordinated: No Fine Motor Movements are Fluid and Coordinated: No Heel Shin Test: unable to perform Motor  Motor Motor: Charles Sherman Motor - Skilled Clinical Observations: hemiparetic L extremities.   Trunk/Postural Assessment  Cervical Assessment Cervical Assessment: Exceptions to [facility] (forward head.) Thoracic Assessment Thoracic Assessment:  (rounded shoulders.) Lumbar Assessment Lumbar Assessment:  (lumbar flexion.) Postural Control Postural Control: Deficits on evaluation Trunk Control: flexed posture, requiring max to mod A although grabs for foot board. Righting Reactions: leans to left  Balance Balance Balance Assessed: Yes Dynamic Sitting Balance Sitting balance - Comments: mod to max A today. Extremity Assessment      RLE Assessment RLE Assessment: Within  Functional Limits LLE Assessment LLE Assessment: Exceptions to [facility] General Strength Comments: grossly 1/5 for all movements.  Care Tool Care Tool Bed Mobility Roll left and right activity   Roll left and right assist level: 2 Helpers (roll to left w/ mod A using side rail and flexed R knee.)    Sit to lying activity   Sit to lying assist level: Total Assistance - Patient < 25%    Lying to sitting edge of bed activity   Lying to sitting edge of bed assist level: Total Assistance - Patient < 25%  Care Tool Transfers Sit to stand transfer   Sit to stand assist level: 2 Helpers    Chair/bed transfer         Toilet transfer   Assist Level: 2 Production assistant, radio transfer activity did not occur: Safety/medical concerns        Care Tool Locomotion Ambulation Ambulation activity did not occur: Safety/medical concerns        Walk 10 feet activity         Walk 50 feet with 2 turns activity        Walk 150 feet activity        Walk 10 feet on uneven surfaces activity Walk 10 feet on uneven surfaces activity did not occur: Safety/medical concerns      Stairs Stair activity did not occur: Safety/medical concerns        Walk up/down 1 step activity          Walk up/down 4 steps activity      Walk up/down 12 steps activity        Pick up small objects from floor Pick up small object from the floor (from standing position) activity did not occur: Safety/medical concerns      Wheelchair Will patient use wheelchair at discharge?: Yes   Wheelchair activity did not occur: Safety/medical concerns      Wheel 50 feet with 2 turns activity      Wheel 150 feet activity        Refer to Care Plan for Long Term Goals  SHORT TERM GOAL WEEK 1 PT Short Term Goal 1 (Week 1): Pt will transfer sup to sit w/ max A of 1. PT Short Term Goal 2 (Week 1): Pt will sit EOB w/ mod A x 3' PT Short Term Goal 3 (Week 1): Pt will transfer sit to stand w/ [place] and max A w/  improved midline standing position.  Recommendations for other services: None   Skilled Therapeutic Intervention Evaluation completed (see details above and below) with education on PT POC and goals and individual treatment initiated with focus on bed mobility, seated balance, endurance, progress to OOB activity.  First session:  Pt required Total A for sup to sit transfers.  Pt stating need for use of commode.  Pt transferred sit to stand in [place] w/ total A, but noted incontinence of BM and NT assisted w/ transferring to commode.  Pt performed standing w/ strong left lean, w/ standing for pericare and donning of new brief, even when sitting.  Pt able to grasp handle to assist w/ standing and transfers w/ B knees not touching front pad.  Pt performed sit to supine w/ total A and verbal and visual cues for use of side rail, able to bring R LE onto bed , but assist for L.  Pt able to assist scooting to [facility] using head board and R LE.  Pt remained in bed w/ bed alarm on and all needs in reach.  Second session:  Pt presents supine in bed and agreeable to therapy.  Pt requires verbal cues to flex R knee and reach across to roll to left w/ mod A.  Pt requires total A for side-lying to sit at EOB, although participating w/ cueing.  Pt able to sit EOB w/ CGA once achieved balance, w/ decreased pushing to left than AM.  Pt sat EOB w/ verbal cues to maintain midline sitting.  Facilitation given to left UE for sitting balance.  Pt returned to left sidelying w/ total A for UB and LLE, although using side rail to control descent.  Pt able to pull self to [facility] using R extremities and min A.  Bed alarm on and all needs in reach.  TIS w/c left in room for use unless PT wishes to change.   Mobility Bed Mobility Bed Mobility: Rolling Left;Rolling Right;Left Sidelying to Sit;Sit to Sidelying Left Rolling Right: Moderate Assistance - Patient 50-74% Rolling Left: Dependent - mechanical lift Left Sidelying to Sit: Total  Assistance - Patient < 25% Sit to Sidelying Left: Total Assistance - Patient < 25% Transfers Transfers: Sit to Stand;Stand to Sit Sit to Stand: Total Assistance - Patient < 25% Stand to Sit: Total Assistance - Patient < 25% Transfer via Lift Equipment: Probation officer Ambulation: No Gait Gait: No Stairs / Additional Locomotion Stairs: No Wheelchair Mobility Wheelchair Mobility: No   Discharge Criteria: Patient will be discharged from PT if patient refuses treatment 3 consecutive times without medical reason, if treatment goals not met, if there is a change in medical status, if patient makes no progress towards goals or if patient is discharged from hospital.  The above assessment, treatment plan, treatment alternatives and goals were discussed and mutually agreed upon: by patient  Ladoris Gene 11/29/2019, 1:01 PM

## 2019-11-29 NOTE — Progress Notes (Signed)
[place] PHYSICAL MEDICINE & REHABILITATION PROGRESS NOTE   Subjective/Complaints:  No issues overnite, pt aware of hospital and stroke, states he used O2 at home.  Denies Right eye pain and cannot tell me why he keeps it shut or when that started, IR H and P notes that pt has very limited vision RIght eye (only outlines of people )  Objective:   No results found. No results for input(s): WBC, HGB, HCT, PLT in the last 72 hours. No results for input(s): NA, K, CL, CO2, GLUCOSE, BUN, CREATININE, CALCIUM in the last 72 hours.  Intake/Output Summary (Last 24 hours) at 11/29/2019 0705 Last data filed at 11/29/2019 0600 Gross per 24 hour  Intake 240 ml  Output 950 ml  Net -710 ml        Physical Exam: Vital Signs Blood pressure (!) 116/57, pulse (!) 55, temperature 98.8 F (37.1 C), resp. rate 19, height 5\' 11"  (1.803 m), weight 110.8 kg, SpO2 100 %.  General: No acute distress Mood and affect are appropriate Heart: Regular rate and rhythm no rubs murmurs or extra sounds Lungs: Clear to auscultation, breathing unlabored, no rales or wheezes Abdomen: Positive bowel sounds, soft nontender to palpation, nondistended Extremities: No clubbing, cyanosis, or edema Skin: No evidence of breakdown, no evidence of rash Neurologic: Cranial nerves II through XII intact, motor strength is 5/5 in RIght ,deltoid, bicep, tricep, grip, hip flexor, knee extensors, ankle dorsiflexor and plantar flexor LUE 2- elbow flex/ext 0/5 hand Sensory exam normal sensation to light touch  bilateral upper and lower extremities  Musculoskeletal: Full range of motion in all 4 extremities. No joint swelling    Assessment/Plan: 1. Functional deficits secondary to Right ICA distribution infarcts  which require 3+ hours per day of interdisciplinary therapy in a comprehensive inpatient rehab setting.  Physiatrist is providing close team supervision and 24 hour management of active medical problems listed  below.  Physiatrist and rehab team continue to assess barriers to discharge/monitor patient progress toward functional and medical goals  Care Tool:  Bathing              Bathing assist       Upper Body Dressing/Undressing Upper body dressing        Upper body assist      Lower Body Dressing/Undressing Lower body dressing      What is the patient wearing?: Incontinence brief     Lower body assist Assist for lower body dressing: 2 Helpers     Toileting Toileting    Toileting assist       Transfers Chair/bed transfer  Transfers assist           Locomotion Ambulation   Ambulation assist              Walk 10 feet activity   Assist           Walk 50 feet activity   Assist           Walk 150 feet activity   Assist           Walk 10 feet on uneven surface  activity   Assist           Wheelchair     Assist               Wheelchair 50 feet with 2 turns activity    Assist            Wheelchair 150 feet activity     Assist  Blood pressure (!) 116/57, pulse (!) 55, temperature 98.8 F (37.1 C), resp. rate 19, height 5\' 11"  (1.803 m), weight 110.8 kg, SpO2 100 %.      Medical Problem List and Plan: 1.  Left-sided weakness and dysarthria secondary to scattered right MCA and ACA infarcts in the setting of right ICA bulb and siphon stenosis and right MCA narrowing due to large right meningioma.  Status post right ICA revascularization with stent assisted angioplasty 11/21/2019             -patient may shower but incision must be covered.             -ELOS/Goals: 2-3 weeks 2.  Antithrombotics: -DVT/anticoagulation: Lovenox             -antiplatelet therapy: Aspirin 81 mg daily and Brilinta 90 mg twice daily 3. Pain Management: Well controlled 4. Mood: Provide emotional support             -antipsychotic agents: N/A 5. Neuropsych: This patient is capable of making decisions on his  own behalf. 6. Skin/Wound Care: Routine skin checks 7. Fluids/Electrolytes/Nutrition: Routine in and outs with follow-up chemistries 8.  Right medial sphenoid wing meningioma.  Followed by neurosurgery.  Decadron protocol.  No current indications for surgical intervention at this time 9.  Hypertension.  Norvasc 10 mg daily, Zaroxolyn 2.5 mg 1/day on Monday, Lopressor 12.5 mg twice daily, Imdur 120 mg daily. BP is well controlled to soft.  Vitals:   11/28/19 2019 11/29/19 0516  BP: 106/60 (!) 116/57  Pulse: (!) 52 (!) 55  Resp: 16 19  Temp: 98.6 F (37 C) 98.8 F (37.1 C)  SpO2: 100% 100%    10.  Diastolic congestive heart failure.  metoprolol, Lasix 40 mg twice daily.  Monitor for any signs of fluid overload 11.  Hyperlipidemia.  Lipitor 12.  History of COPD/ tobacco abuse.  Counseling.  Albuterol inhaler as needed 13.  GERD.  Protonix 14.  Dysphagia.  Dysphagia #2 thin liquids.  Follow-up speech therapy 15. Bradycardic:on BB monitor TID.   LOS: 1 days A FACE TO FACE EVALUATION WAS PERFORMED  Charlett Blake 11/29/2019, 7:05 AM

## 2019-11-29 NOTE — Progress Notes (Signed)
Pt rested well over night, no complaints at this time. No signs of distress noted. Pt laying in bed, watching tv. Call light in reach.

## 2019-11-29 NOTE — Progress Notes (Signed)
Pt w/ yellow MEWS. No signs of distress noted. Denies Pain, dizzy, nausea/vomiting. [place] notified. Yellow MEWs protocol implemented.

## 2019-11-29 NOTE — Evaluation (Signed)
Occupational Therapy Assessment and Plan  Patient Details  Name: Charles Sherman MRN: [id number] Date of Birth: 05/01/1945  OT Diagnosis: abnormal posture, cognitive deficits, disturbance of vision, hemiplegia affecting non-dominant side and muscle weakness (generalized) Rehab Potential: Rehab Potential (ACUTE ONLY): Good ELOS: 24-28 days   Today's Date: 11/29/2019 OT Individual Time: 9485-4627 OT Individual Time Calculation (min): 64 min     Hospital Problem: Principal Problem:   Cerebral thrombosis with cerebral infarction Active Problems:   Right middle cerebral artery stroke [facility])   Past Medical History:  Past Medical History:  Diagnosis Date  . CHF (congestive heart failure) ([place])   . COPD (chronic obstructive pulmonary disease) ([place])   . HTN (hypertension)   . Myocardial infarct ([place])   . OSA (obstructive sleep apnea)    on CPAP   Past Surgical History:  Past Surgical History:  Procedure Laterality Date  . IR ANGIO INTRA EXTRACRAN SEL COM CAROTID INNOMINATE BILAT MOD SED  11/19/2019  . IR ANGIO VERTEBRAL SEL VERTEBRAL BILAT MOD SED  11/19/2019  . IR CT HEAD LTD  11/21/2019  . IR INTRAVSC STENT CERV CAROTID W/O EMB-PROT MOD SED INC ANGIO  11/21/2019  . IR US GUIDE VASC ACCESS RIGHT  11/19/2019  . PERCUTANEOUS CORONARY STENT INTERVENTION (PCI-S)    . RADIOLOGY WITH ANESTHESIA Right 11/21/2019   Procedure: RIGHT CAROTID STENTING;  Surgeon: Luanne Bras, MD;  Location: [place];  Service: Radiology;  Laterality: Right;    Assessment & Plan Clinical Impression: Patient is a 74 y.o. year old male with recent admission to the hospital on 11/14/2019 with left-sided weakness.  Cranial CT scan showed acute infarct right MCA territory in the right frontal parietal lobe extending into the occipital lobe.  No acute hemorrhage.  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  Patient transferred to CIR on 11/28/2019 .    Patient currently requires total with  basic self-care skills secondary to muscle weakness and muscle paralysis, impaired timing and sequencing, unbalanced muscle activation and decreased coordination, field cut, decreased midline orientation, decreased attention to left and left side neglect, decreased attention, decreased awareness and decreased memory and decreased sitting balance, decreased standing balance, decreased postural control, hemiplegia and decreased balance strategies.  Prior to hospitalization, patient could complete ADLs with independent .  Patient will benefit from skilled intervention to decrease level of assist with basic self-care skills and increase independence with basic self-care skills prior to discharge home with care partner.  Anticipate patient will require minimal physical assistance and follow up home health.  OT - End of Session Activity Tolerance: Decreased this session;Tolerates 10 - 20 min activity with multiple rests Endurance Deficit: Yes OT Assessment Rehab Potential (ACUTE ONLY): Good OT Patient demonstrates impairments in the following area(s): Balance;Cognition;Endurance;Motor;Sensory;Safety;Perception;Vision OT Basic ADL's Functional Problem(s): Grooming;Bathing;Dressing;Toileting OT Transfers Functional Problem(s): Toilet;Tub/Shower OT Additional Impairment(s): Fuctional Use of Upper Extremity OT Plan OT Intensity: Minimum of 1-2 x/day, 45 to 90 minutes OT Frequency: 5 out of 7 days OT Duration/Estimated Length of Stay: 24-28 days OT Treatment/Interventions: Balance/vestibular training;Cognitive remediation/compensation;Community reintegration;DME/adaptive equipment instruction;Disease mangement/prevention;Discharge planning;Functional electrical stimulation;Functional mobility training;Neuromuscular re-education;Psychosocial support;Patient/family education;Pain management;Self Care/advanced ADL retraining;UE/LE Coordination activities;Therapeutic Activities;Therapeutic  Exercise;Visual/perceptual remediation/compensation;Splinting/orthotics;UE/LE Strength taining/ROM;Wheelchair propulsion/positioning OT Self Feeding Anticipated Outcome(s): setup assist OT Basic Self-Care Anticipated Outcome(s): min to mod assist OT Toileting Anticipated Outcome(s): min to mod assist OT Bathroom Transfers Anticipated Outcome(s): min assist OT Recommendation Patient destination: Home Follow Up Recommendations: Home health OT;24 hour supervision/assistance Equipment Recommended: To be determined  OT Evaluation Precautions/Restrictions  Precautions Precautions: Fall Precaution Comments: left neglect, left hemiparesis, slight pusher to the left Restrictions Weight Bearing Restrictions: No  Pain Pain Assessment Pain Scale: Faces Pain Score: 0-No pain Home Living/Prior Functioning Home Living Available Help at Discharge: Family, Available 24 hours/day Type of Home: House Home Access: Stairs to enter [employer] of Steps: 3 Entrance Stairs-Rails: None Home Layout: One level, Laundry or work area in basement [employer] Shower/Tub: Chiropodist: Standard Additional Comments: Adult grandson works nights; wife's sister is a Quarry manager who can assist  Lives With: Spouse, Family (grandson) IADL History Homemaking Responsibilities: No Current License: Yes Occupation: Retired Leisure and Hobbies: Likes to fish Prior Function Level of Independence: Independent with transfers, Independent with gait, Independent with basic ADLs  Able to Take Stairs?: Yes Driving: Yes Comments: pt reports he enjoys going fishing Vision Baseline Vision/History: No visual deficits Patient Visual Report: Blurring of vision Vision Assessment?: Yes Eye Alignment: Impaired (comment) (right eye lid stays closed and is resistive to opening to look at the eye itself) Ocular Range of Motion: Other (comment) (left eye occular ROM WFLS for gross testing) Alignment/Gaze  Preference: Head turned (pt with head turn to the right) Tracking/Visual Pursuits: Decreased smoothness of horizontal tracking;Decreased smoothness of vertical tracking;Other (comment) (He was able to track in all areas but with lost fixation) Convergence: Impaired (comment) Visual Fields: Left homonymous hemianopsia (difficult to assess accurately secondary to pt continually moving his left eye toward direction target was coming from.  With confrontational testing, he did not see the object on the left inferior side when presented.   Will continue to evaluate further) Perception  Perception: Impaired Inattention/Neglect: Does not attend to left visual field Praxis Praxis: Intact Cognition Overall Cognitive Status: Impaired/Different from baseline Arousal/Alertness: Awake/alert Orientation Level: Person;Place;Situation Person: Oriented Place: Oriented Situation: Oriented Year: 2021 Month: October Day of Week: Correct Memory: Impaired Memory Impairment: Retrieval deficit;Decreased short term memory Decreased Short Term Memory: Verbal basic Immediate Memory Recall: Sock;Blue;Bed Memory Recall Sock: Without Cue Memory Recall Blue: Without Cue Memory Recall Bed: Without Cue Attention: Sustained Sustained Attention: Impaired Selective Attention: Impaired Selective Attention Impairment: Verbal basic Awareness: Impaired Awareness Impairment: Emergent impairment Problem Solving: Impaired Problem Solving Impairment: Verbal basic;Functional basic Executive Function: Reasoning;Decision Making Reasoning: Impaired Reasoning Impairment: Functional basic Decision Making: Impaired Decision Making Impairment: Functional basic Behaviors: Impulsive Safety/Judgment: Impaired Sensation Sensation Light Touch: Appears Intact Hot/Cold: Not tested Proprioception: Impaired by gross assessment (decreased proprioception in the left wrist and hand when tested) Stereognosis: Not  tested Coordination Gross Motor Movements are Fluid and Coordinated: No Fine Motor Movements are Fluid and Coordinated: No Coordination and Movement Description: Brunnstrum stage I in the left arm and hand with no functional use at this time without max facilitation. Motor  Motor Motor: Hemiplegia;Abnormal postural alignment and control Motor - Skilled Clinical Observations: severe left hemiparesis with slight pushing to the left  Trunk/Postural Assessment  Cervical Assessment Cervical Assessment: Exceptions to [facility] (cervical rotation to the right at rest) Thoracic Assessment Thoracic Assessment: Exceptions to [facility] (thoracic rounding) Lumbar Assessment Lumbar Assessment: Exceptions to [facility] (posterior pelvic tilt) Postural Control Postural Control: Deficits on evaluation Trunk Control: pushing to the left with increased lean to the left  Balance Balance Balance Assessed: Yes Static Sitting Balance Static Sitting - Balance Support: Feet supported Static Sitting - Level of Assistance: 2: Max assist Dynamic Sitting Balance Dynamic Sitting - Balance Support: Feet supported Dynamic Sitting - Level of Assistance: 2: Max assist Sitting balance -  Comments: LOB to the left and forward with selfcare tasks Static Standing Balance Static Standing - Balance Support: During functional activity Static Standing - Level of Assistance: 2: Max assist Extremity/Trunk Assessment RUE Assessment RUE Assessment: Within Functional Limits LUE Assessment LUE Assessment: Exceptions to [facility] Passive Range of Motion (PROM) Comments: WFLs Active Range of Motion (AROM) Comments: Brunnstrum stage I in the arm and hand  Care Tool Care Tool Self Care Eating   Eating Assist Level: Set up assist    Oral Care    Oral Care Assist Level: Minimal Assistance - Patient > 75%    Bathing   Body parts bathed by patient: Chest;Abdomen;Left arm;Face     Assist Level: 2 Helpers (supine to sit)    Upper Body  Dressing(including orthotics)   What is the patient wearing?: Pull over shirt   Assist Level: Dependent - Patient 0%    Lower Body Dressing (excluding footwear)   What is the patient wearing?: Incontinence brief;Pants Assist for lower body dressing: 2 Helpers    Putting on/Taking off footwear   What is the patient wearing?: Non-skid slipper socks Assist for footwear: Dependent - Patient 0%       Care Tool Toileting Toileting activity   Assist for toileting: 2 Helpers     Care Tool Bed Mobility Roll left and right activity   Roll left and right assist level: Maximal Assistance - Patient 25 - 49%    Sit to lying activity   Sit to lying assist level: Total Assistance - Patient < 25%    Lying to sitting edge of bed activity   Lying to sitting edge of bed assist level: Total Assistance - Patient < 25%     Care Tool Transfers Sit to stand transfer   Sit to stand assist level: Total Assistance - Patient < 25%    Chair/bed transfer         Toilet transfer   Assist Level: 2 Helpers Charlaine Dalton)     Care Tool Cognition Expression of Ideas and Wants Expression of Ideas and Wants: Some difficulty - exhibits some difficulty with expressing needs and ideas (e.g, some words or finishing thoughts) or speech is not clear   Understanding Verbal and Non-Verbal Content Understanding Verbal and Non-Verbal Content: Usually understands - understands most conversations, but misses some part/intent of message. Requires cues at times to understand   Memory/Recall Ability *first 3 days only Memory/Recall Ability *first 3 days only: Current season;Location of own room;That he or she is in a hospital/hospital unit    Refer to Care Plan for [place] 1 OT Short Term Goal 1 (Week 1): Pt will maintain static sitting balance EOB or EOM with min guard assist for 3 mins in preparation for selfcare tasks. OT Short Term Goal 2 (Week 1): Pt will complete UB bathing with min  assist in supported sitting for 2 consecutive sessions. OT Short Term Goal 3 (Week 1): Pt will complete LB dressing sit to stand with max assist of 1 for 2 consecutive sessions. OT Short Term Goal 4 (Week 1): Pt will complete UB dressing with mod assist sitting unsupported.  Recommendations for other services: None    Skilled Therapeutic Intervention ADL ADL Eating: Supervision/safety Where Assessed-Eating: Bed level Grooming: Minimal assistance Where Assessed-Grooming: Bed level Upper Body Bathing: Maximal assistance Where Assessed-Upper Body Bathing: Edge of bed Lower Body Bathing: Other (comment) (total +2 assist) Where Assessed-Lower Body Bathing: Bed level Upper Body Dressing: Dependent Where  Assessed-Upper Body Dressing: Edge of bed Lower Body Dressing: Other (Comment) (total +2 supine to sit) Where Assessed-Lower Body Dressing: Edge of bed;Bed level Toileting: Other (Comment) (total +2) Where Assessed-Toileting: Bed level Toilet Transfer: Other (comment) (total +2 with stedy) Toilet Transfer Method: Other (comment) Toilet Transfer Equipment: Bedside commode Mobility  Bed Mobility Bed Mobility: Rolling Right;Rolling Left;Sit to Supine;Left Sidelying to Sit Rolling Right: Moderate Assistance - Patient 50-74% Rolling Left: Total Assistance - Patient < 25% Left Sidelying to Sit: Total Assistance - Patient < 25% Sit to Supine: Total Assistance - Patient < 25% Transfers Sit to Stand: Total Assistance - Patient < 25% Stand to Sit: Total Assistance - Patient < 25%  Pt worked on selfcare retraining sit to supine.  Mod instructional cueing for sustained attention as he would close his eyes to start session when in bed with HOB elevated.  He maintained the right eye closed and could not open it when asked.  If therapist tried to open it, he would almost resist and did not tolerate it.  He was able to wash his face with setup and mod instructional cueing for thoroughness.  Maintained  head turn to the right in supine as well as sitting, but would scan left of midline if cued or asked to locate something.  He demonstrated increased pushing to the left in sitting with increased trunk flexion as well.  Sit to stand X 1 with total assist was completed prior to return to bed to rest.  Did not transfer OOB as he did not have an appropriate wheelchair.  Discussed expectations for LOS as well as need for assist at discharge.  Pt in agreement to plan.   Discharge Criteria: Patient will be discharged from OT if patient refuses treatment 3 consecutive times without medical reason, if treatment goals not met, if there is a change in medical status, if patient makes no progress towards goals or if patient is discharged from hospital.  The above assessment, treatment plan, treatment alternatives and goals were discussed and mutually agreed upon: by patient  Dejohn Ibarra OTR/L 11/29/2019, 5:30 PM

## 2019-11-29 NOTE — Progress Notes (Signed)
[facility] Individual Statement of Services  Patient Name:  Charles Sherman  Date:  11/29/2019  Welcome to the [facility].  Our goal is to provide you with an individualized program based on your diagnosis and situation, designed to meet your specific needs.  With this comprehensive rehabilitation program, you will be expected to participate in at least 3 hours of rehabilitation therapies Monday-Friday, with modified therapy programming on the weekends.  Your rehabilitation program will include the following services:  Physical Therapy (PT), Occupational Therapy (OT), Speech Therapy (ST), 24 hour per day rehabilitation nursing, Therapeutic Recreaction (TR), Neuropsychology, Care Coordinator, Rehabilitation Medicine, Nutrition Services, Pharmacy Services and Other  Weekly team conferences will be held on Wednesday to discuss your progress.  Your Inpatient Rehabilitation Care Coordinator will talk with you frequently to get your input and to update you on team discussions.  Team conferences with you and your family in attendance may also be held.  Expected length of stay: 2-3 Weeks  Overall anticipated outcome: Min A  Depending on your progress and recovery, your program may change. Your Inpatient Rehabilitation Care Coordinator will coordinate services and will keep you informed of any changes. Your Inpatient Rehabilitation Care Coordinator's name and contact numbers are listed  below.  The following services may also be recommended but are not provided by the [facility]:    [facility] will be made to provide these services after discharge if needed.  Arrangements include referral to agencies that provide these services.  Your insurance has been verified to be:  Medicare Your primary doctor is:  [facility] [place]  Pertinent information  will be shared with your doctor and your insurance company.  Inpatient Rehabilitation Care Coordinator:  Erlene Quan, [place] or [phone]  Information discussed with and copy given to patient by: Dyanne Iha, 11/29/2019, 12:13 PM

## 2019-11-29 NOTE — Discharge Instructions (Signed)
Inpatient Rehab Discharge Instructions  Charles Sherman Discharge date and time: No discharge date for patient encounter.   Activities/Precautions/ Functional Status: Activity: activity as tolerated Diet: soft Wound Care: Routine skin checks Functional status:  ___ No restrictions     ___ Walk up steps independently ___ 24/7 supervision/assistance   ___ Walk up steps with assistance ___ Intermittent supervision/assistance  ___ Bathe/dress independently ___ Walk with walker     _x__ Bathe/dress with assistance ___ Walk Independently    ___ Shower independently ___ Walk with assistance    ___ Shower with assistance ___ No alcohol     ___ Return to work/school ________  Special Instructions: No driving smoking or alcohol STROKE/TIA DISCHARGE INSTRUCTIONS SMOKING Cigarette smoking nearly doubles your risk of having a stroke & is the single most alterable risk factor  If you smoke or have smoked in the last 12 months, you are advised to quit smoking for your health.  Most of the excess cardiovascular risk related to smoking disappears within a year of stopping.  Ask you doctor about anti-smoking medications  [place] Quit Line: 1-800-QUIT NOW  Free Smoking Cessation Classes [phone]  CHOLESTEROL Know your levels; limit fat & cholesterol in your diet  Lipid Panel     Component Value Date/Time   CHOL 189 11/15/2019 1143   TRIG 54 11/15/2019 1143   HDL 64 11/15/2019 1143   CHOLHDL 3.0 11/15/2019 1143   VLDL 11 11/15/2019 1143   LDLCALC 114 (H) 11/15/2019 1143      Many patients benefit from treatment even if their cholesterol is at goal.  Goal: Total Cholesterol (CHOL) less than 160  Goal:  Triglycerides (TRIG) less than 150  Goal:  HDL greater than 40  Goal:  LDL (LDLCALC) less than 100   BLOOD PRESSURE American Stroke Association blood pressure target is less that 120/80 mm/Hg  Your discharge blood pressure is:  BP: (!) 116/57  Monitor your blood  pressure  Limit your salt and alcohol intake  Many individuals will require more than one medication for high blood pressure  DIABETES (A1c is a blood sugar average for last 3 months) Goal HGBA1c is under 7% (HBGA1c is blood sugar average for last 3 months)  Diabetes: No known diagnosis of diabetes    Lab Results  Component Value Date   HGBA1C 5.4 11/15/2019     Your HGBA1c can be lowered with medications, healthy diet, and exercise.  Check your blood sugar as directed by your physician  Call your physician if you experience unexplained or low blood sugars.  PHYSICAL ACTIVITY/REHABILITATION Goal is 30 minutes at least 4 days per week  Activity: Increase activity slowly, Therapies: Physical Therapy: Home Health Return to work:   Activity decreases your risk of heart attack and stroke and makes your heart stronger.  It helps control your weight and blood pressure; helps you relax and can improve your mood.  Participate in a regular exercise program.  Talk with your doctor about the best form of exercise for you (dancing, walking, swimming, cycling).  DIET/WEIGHT Goal is to maintain a healthy weight  Your discharge diet is:  Diet Order            DIET DYS 3 Room service appropriate? Yes with Assist; Fluid consistency: Thin  Diet effective now                 liquids Your height is:  Height: 5\' 11"  (180.3 cm) Your current weight is: Weight: 110.8 kg  Your Body Mass Index (BMI) is:  BMI (Calculated): 34.07  Following the type of diet specifically designed for you will help prevent another stroke.  Your goal weight range is:    Your goal Body Mass Index (BMI) is 19-24.  Healthy food habits can help reduce 3 risk factors for stroke:  High cholesterol, hypertension, and excess weight.  RESOURCES Stroke/Support Group:  Call [phone]   STROKE EDUCATION PROVIDED/REVIEWED AND GIVEN TO PATIENT Stroke warning signs and symptoms How to activate emergency medical system (call  [phone]). Medications prescribed at discharge. Need for follow-up after discharge. Personal risk factors for stroke. Pneumonia vaccine given:  Flu vaccine given:  My questions have been answered, the writing is legible, and I understand these instructions.  I will adhere to these goals & educational materials that have been provided to me after my discharge from the hospital.      My questions have been answered and I understand these instructions. I will adhere to these goals and the provided educational materials after my discharge from the hospital.  Patient/Caregiver Signature _______________________________ Date __________  Clinician Signature _______________________________________ Date __________  Please bring this form and your medication list with you to all your follow-up doctor's appointments.

## 2019-11-29 NOTE — Evaluation (Signed)
Speech Language Pathology Assessment and Plan  Patient Details  Name: Charles Sherman MRN: [id number] Date of Birth: 1945/06/22  SLP Diagnosis: Cognitive Impairments;Dysphagia  Rehab Potential: Good ELOS: 3-4 weeks    Today's Date: 11/29/2019 SLP Individual Time: 1100-1155 SLP Individual Time Calculation (min): 30 min   Hospital Problem: Principal Problem:   Cerebral thrombosis with cerebral infarction Active Problems:   Right middle cerebral artery stroke [facility])  Past Medical History:  Past Medical History:  Diagnosis Date  . CHF (congestive heart failure) ([place])   . COPD (chronic obstructive pulmonary disease) ([place])   . HTN (hypertension)   . Myocardial infarct ([place])   . OSA (obstructive sleep apnea)    on CPAP   Past Surgical History:  Past Surgical History:  Procedure Laterality Date  . IR ANGIO INTRA EXTRACRAN SEL COM CAROTID INNOMINATE BILAT MOD SED  11/19/2019  . IR ANGIO VERTEBRAL SEL VERTEBRAL BILAT MOD SED  11/19/2019  . IR CT HEAD LTD  11/21/2019  . IR INTRAVSC STENT CERV CAROTID W/O EMB-PROT MOD SED INC ANGIO  11/21/2019  . IR US GUIDE VASC ACCESS RIGHT  11/19/2019  . PERCUTANEOUS CORONARY STENT INTERVENTION (PCI-S)    . RADIOLOGY WITH ANESTHESIA Right 11/21/2019   Procedure: RIGHT CAROTID STENTING;  Surgeon: Luanne Bras, MD;  Location: [place];  Service: Radiology;  Laterality: Right;    Assessment / Plan / Recommendation Clinical Impression   HPI: Charles Sherman is a 74 year old right-handed male with history of diastolic congestive heart failure, hypertension, OSA on CPAP, tobacco abuse as well as 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  Per chart review lives with spouse.  1 level home 2 steps to entry.  Reportedly independent prior to admission and active.  Presented 11/14/2019 with left-sided weakness.  Cranial CT scan showed acute infarct right MCA territory in the right frontal parietal lobe extending into the  occipital lobe.  No acute hemorrhage.  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  There was some vasogenic edema in the right temporal parietal lobe 9 mm midline shift to the left again unchanged.  CT angiogram of head and neck with extensive atherosclerotic calcification in the cavernous carotids bilaterally with mild stenosis.  The tumor abuts the right cavernous and supraclinoid internal carotid artery without encasement or occlusion.  Neurosurgery did review scanned no current plan for arteriography or stenting at this time.  Admission chemistries glucose 121 WBC 15,000.  Echocardiogram showed no significant regurgitation or distraction of the valve to suggest endocarditis.  Left ventricular ejection fraction 60 to 65% no wall motion abnormalities grade 1 diastolic dysfunction.  No current plan for TEE after echocardiogram reviewed by cardiology services.  Cerebral angiogram completed showing high-grade stenosis of approximately 75% associated with soft smooth plaque.  Approximate 60% stenosis of right VA proximal and 60% of left VA origin.  Mass-effect on right MCA secondary to large sphenoid wing meningioma without stenosis or intraluminal filling defect.  Patient did undergo right ICA revascularization with stent assisted angioplasty 11/21/2019.  Patient currently maintained on aspirin as well as Brilinta and he was cleared to begin Lovenox for DVT prophylaxis.  Completing Decadron protocol.  Currently on a dysphagia #3 thin liquid diet.  Therapy evaluations completed and patient was admitted for a comprehensive rehab program 11/28/19 and SLP evaluations were completed 11/29/19 with results as follows:  Pt presents with mild oral dysphagia characterized by weak lingual manipulation, slightly prolonged mastication, and trace lingual residue of regular texture  solids, although no buccal pocketing observed today (pt has hx or left buccal pocketing per acute ST notes). Mastication of  Dysphagia 3 (mechanical soft) textures was more efficient than regular textures and no oral residue present. Labial seal slightly reduced due to left facial weakness, however no anterior loss noted. No overt s/sx aspiration noted across intake of solids or thin liquids. Recommend pt continue current dysphagia 3 (mechanical soft) texture diet, thin liquids, medications whole with puree. Pt will require full supervision due to his visual impairments, as he frequently cannot locate items on left side of tray or plate.  Pt also demonstrates moderate cognitive impairments most remarkable for reduced emergent and safety awareness, left visual inattention, problem solving, and selective attention. Pt's scores on the Cognistat cognitive evaluation reflected severe impairments on visual construction subtest and moderate impairments on calculations, otherwise scores fell WNL. Safety awareness deficits noted when SLP arrived and pt was attempting to get out of bed without assistance, despite being aware of need for +2 assistance for transfers/ambulation. He did verbally state awareness of need to call for help prior to getting up. Pt also unaware of functional errors throughout assessment and Max A cueing required for attention to therapist on left side of environment or items on left side of tray. Although pt's vocal intensity was low, he was fully intelligible in conversation and expressive/receptive language skills were [facility].   Recommend pt receive skilled ST services to address dysphagia and cognitive impairments as described above in order to ensure diet safety and efficiency as well as increase functional independence and safety prior to discharge home.    Skilled Therapeutic Interventions          Bedside swallow and cognitive-linguistic evaluations were administered and results were reviewed with pt (please see above for details regarding results).    SLP Assessment  Patient will need skilled Speech Lanaguage  Pathology Services during CIR admission    Recommendations  SLP Diet Recommendations: Dysphagia 3 (Mech soft) Liquid Administration via: Cup;Straw Medication Administration: Whole meds with puree Supervision: Patient able to self feed;Full supervision/cueing for compensatory strategies Compensations: Slow rate;Small sips/bites;Lingual sweep for clearance of pocketing Postural Changes and/or Swallow Maneuvers: Seated upright 90 degrees Oral Care Recommendations: Oral care BID Recommendations for Other Services: Neuropsych consult Patient destination: Home Follow up Recommendations: 24 hour supervision/assistance;Home Health SLP Equipment Recommended: None recommended by SLP    SLP Frequency 3 to 5 out of 7 days   SLP Duration  SLP Intensity  SLP Treatment/Interventions 3-4 weeks  Minumum of 1-2 x/day, 30 to 90 minutes  Cognitive remediation/compensation;Cueing hierarchy;Functional tasks;Patient/family education;Dysphagia/aspiration precaution training;Internal/external aids    Pain Pain Assessment Pain Scale: 0-10 Pain Score: 0-No pain     SLP Evaluation Cognition Overall Cognitive Status: Impaired/Different from baseline Arousal/Alertness: Lethargic Orientation Level: Oriented X4 Attention: Selective Sustained Attention: Appears intact Selective Attention: Impaired Selective Attention Impairment: Verbal basic Memory: Impaired Memory Impairment: Retrieval deficit;Decreased short term memory Decreased Short Term Memory: Verbal basic Awareness: Impaired Awareness Impairment: Emergent impairment Problem Solving: Impaired Problem Solving Impairment: Verbal basic;Functional basic Executive Function: Reasoning;Decision Making Reasoning: Impaired Reasoning Impairment: Functional basic Decision Making: Impaired Decision Making Impairment: Functional basic Safety/Judgment: Impaired  Comprehension Auditory Comprehension Overall Auditory Comprehension: Appears within  functional limits for tasks assessed Conversation: Simple Visual Recognition/Discrimination Discrimination: Not tested Reading Comprehension Reading Status: Not tested Expression Expression Primary Mode of Expression: Verbal Verbal Expression Overall Verbal Expression: Appears within functional limits for tasks assessed Written Expression Written Expression: Not tested Oral Motor  Oral Motor/Sensory Function Overall Oral Motor/Sensory Function: Mild impairment Facial ROM: Reduced left;Suspected CN VII (facial) dysfunction Facial Symmetry: Abnormal symmetry left;Suspected CN VII (facial) dysfunction Facial Strength: Reduced left;Suspected CN VII (facial) dysfunction Lingual ROM: Suspected CN XII (hypoglossal) dysfunction Lingual Symmetry: Abnormal symmetry left;Suspected CN XII (hypoglossal) dysfunction Lingual Strength: Reduced;Suspected CN XII (hypoglossal) dysfunction Velum: Within Functional Limits Mandible: Within Functional Limits Motor Speech Overall Motor Speech: Impaired Respiration: Within functional limits Phonation: Low vocal intensity Articulation: Impaired Level of Impairment: Conversation Intelligibility: Intelligible Word: 75-100% accurate Phrase: 75-100% accurate Sentence: 75-100% accurate Conversation: 50-74% accurate Motor Planning: Witnin functional limits Motor Speech Errors: Not applicable Effective Techniques: Increased vocal intensity  Care Tool Care Tool Cognition Expression of Ideas and Wants Expression of Ideas and Wants: Some difficulty - exhibits some difficulty with expressing needs and ideas (e.g, some words or finishing thoughts) or speech is not clear   Understanding Verbal and Non-Verbal Content Understanding Verbal and Non-Verbal Content: Usually understands - understands most conversations, but misses some part/intent of message. Requires cues at times to understand   Memory/Recall Ability *first 3 days only Memory/Recall Ability *first 3  days only: That he or she is in a hospital/hospital unit;Current season     Intelligibility: Intelligible Word: 75-100% accurate Phrase: 75-100% accurate Sentence: 75-100% accurate Conversation: 50-74% accurate  Bedside Swallowing Assessment General Date of Onset: 11/14/19 Previous Swallow Assessment: BSE 11/15/19 Diet Prior to this Study: Dysphagia 3 (soft);Thin liquids Temperature Spikes Noted: No Respiratory Status: Supplemental O2 delivered via (comment) (nasal cannula) History of Recent Intubation: No Behavior/Cognition: Cooperative;Lethargic/Drowsy Oral Cavity - Dentition: Missing dentition Self-Feeding Abilities: Able to feed self;Needs assist (needs assist attending to left side of tray and plate) Vision: Impaired for self-feeding (left inattention) Patient Positioning: Upright in bed Baseline Vocal Quality: Normal Volitional Cough: Weak Volitional Swallow: Able to elicit  Oral Care Assessment Does patient have any of the following "high(er) risk" factors?: None of the above Does patient have any of the following "at risk" factors?: Other - dysphagia Patient is AT RISK: Order set for Adult Oral Care Protocol initiated -  "At Risk Patients" option selected (see row information) Patient is LOW RISK: Follow universal precautions (see row information) Ice Chips Ice chips: Not tested Thin Liquid Thin Liquid: Within functional limits Presentation: Cup;Self Fed;Straw Nectar Thick Nectar Thick Liquid: Not tested Honey Thick Honey Thick Liquid: Not tested Puree Puree: Not tested Solid Solid: Impaired Presentation: Self Fed Oral Phase Impairments: Impaired mastication Oral Phase Functional Implications: Oral residue BSE Assessment Risk for Aspiration Impact on safety and function: Mild aspiration risk  Short Term Goals: Week 1: SLP Short Term Goal 1 (Week 1): Pt will consume trials of regular textures X2, demonstrating efficient mastication and oral clearance with no  more than Min A verbal cues for use of swallow strategies prior to advancement. SLP Short Term Goal 2 (Week 1): Pt will demonstrate ability to problem solve basic to mildly complex tasks with Mod A verbal/visual cues. SLP Short Term Goal 3 (Week 1): Pt will detect functional errors with Mod A verbal/visual cues. SLP Short Term Goal 4 (Week 1): Pt will scan left visual field or attend to objects in left side of environment in 8/10 opportunities with Max A multimodal cues. SLP Short Term Goal 5 (Week 1): Pt will recall 2 safety precautions with Mod A verbal/visual cues. SLP Short Term Goal 6 (Week 1): Pt will selectively attend to functional tasks with Min A verbal and visual cues.  Refer to Care Plan  for Long Term Goals  Recommendations for other services: Neuropsych  Discharge Criteria: Patient will be discharged from SLP if patient refuses treatment 3 consecutive times without medical reason, if treatment goals not met, if there is a change in medical status, if patient makes no progress towards goals or if patient is discharged from hospital.  The above assessment, treatment plan, treatment alternatives and goals were discussed and mutually agreed upon: by patient  Arbutus Leas 11/29/2019, 12:16 PM

## 2019-11-29 NOTE — Progress Notes (Signed)
Inpatient Rehabilitation  Patient information reviewed and entered into eRehab system by Teruo Stilley M. Elex Mainwaring, M.A., CCC/SLP, PPS Coordinator.  Information including medical coding, functional ability and quality indicators will be reviewed and updated through discharge.    

## 2019-11-29 NOTE — Progress Notes (Signed)
Patient Details  Name: Charles Sherman MRN: [id number] Date of Birth: [birth year]  Today's Date: 11/29/2019  Hospital Problems: Principal Problem:   Cerebral thrombosis with cerebral infarction Active Problems:   Right middle cerebral artery stroke [facility])  Past Medical History:  Past Medical History:  Diagnosis Date  . CHF (congestive heart failure) ([place])   . COPD (chronic obstructive pulmonary disease) ([place])   . HTN (hypertension)   . Myocardial infarct ([place])   . OSA (obstructive sleep apnea)    on CPAP   Past Surgical History:  Past Surgical History:  Procedure Laterality Date  . IR ANGIO INTRA EXTRACRAN SEL COM CAROTID INNOMINATE BILAT MOD SED  11/19/2019  . IR ANGIO VERTEBRAL SEL VERTEBRAL BILAT MOD SED  11/19/2019  . IR CT HEAD LTD  11/21/2019  . IR INTRAVSC STENT CERV CAROTID W/O EMB-PROT MOD SED INC ANGIO  11/21/2019  . IR US GUIDE VASC ACCESS RIGHT  11/19/2019  . PERCUTANEOUS CORONARY STENT INTERVENTION (PCI-S)    . RADIOLOGY WITH ANESTHESIA Right 11/21/2019   Procedure: RIGHT CAROTID STENTING;  Surgeon: Charles Bras, MD;  Location: [place];  Service: Radiology;  Laterality: Right;   Social History:  reports that he has been smoking cigarettes. He has been smoking about 0.50 packs per day. He has never used smokeless tobacco. He reports current alcohol use of about 1.0 standard drink of alcohol per week. He reports that he does not use drugs.  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, [place] (Medical POA)   Abuse/Neglect Abuse/Neglect Assessment Can Be Completed: Yes Physical Abuse:  Denies Verbal Abuse: Denies Sexual Abuse: Denies Exploitation of patient/patient's resources: Denies Self-Neglect: Denies  Emotional Status Pt's affect, behavior and adjustment status: no Recent Psychosocial Issues: no Psychiatric History: no Substance Abuse History: no  Patient / Family Perceptions, Expectations & Goals Pt/Family understanding of illness & functional limitations: yes Premorbid pt/family roles/activities: spouse/independent Anticipated changes in roles/activities/participation: Family willing to asisst Pt/family expectations/goals: Goal to d/c [place]: None Premorbid Home Care/DME Agencies:  (CPAP) Transportation available at discharge: family able to transport  Discharge Planning Support Systems: Spouse/significant other, Other (Comment) (Grandson ([phone])) Type of Residence: Private residence ([address] to enter) Insurance Resources: Chartered certified accountant Resources: [employer] Screen Referred: No Living Expenses: Medical laboratory scientific officer Management: Patient, Spouse Does the patient have any problems obtaining your medications?: No Home Management: Independent Care Coordinator Barriers to Discharge: Decreased caregiver support, Lack of/limited family support Care Coordinator Barriers to Discharge Comments: Spouse has medical concerns Care Coordinator Anticipated Follow Up Needs: HH/OP Expected length of stay: 2-3 weeks  Clinical Impression Sw entered room introduced self, explained role and process. Patient prefers to be called "[name]", per patient and family (updadted in chart). Patient reports being tired. Became more aroused after having some lunch. Sw attempted to call spouse, left voicemail. Call patient sister (Medical POA). Provided role and progress. Family still deciding who will be primary caregiver (depending on level of care). Family also working with [place]. Will continue to follow up with questions and concerns.  Family will bring in patient "Sun Drop" drinks. Sw allowed patient to speak with sister. Staff at bedside assisting with feeding.  Dyanne Iha 11/29/2019,  12:40 PM

## 2019-11-30 ENCOUNTER — Inpatient Hospital Stay (HOSPITAL_COMMUNITY): Payer: No Typology Code available for payment source | Admitting: Occupational Therapy

## 2019-11-30 ENCOUNTER — Inpatient Hospital Stay (HOSPITAL_COMMUNITY): Payer: No Typology Code available for payment source | Admitting: Speech Pathology

## 2019-11-30 MED ORDER — CAMPHOR-MENTHOL 0.5-0.5 % EX LOTN
TOPICAL_LOTION | CUTANEOUS | Status: DC | PRN
  Filled 2019-11-30: qty 222

## 2019-11-30 MED ORDER — ARTIFICIAL TEARS OPHTHALMIC OINT
TOPICAL_OINTMENT | Freq: Every day | OPHTHALMIC | Status: DC
  Administered 2019-12-10 – 2019-12-16 (×3): 1 via OPHTHALMIC
  Filled 2019-11-30 (×2): qty 3.5

## 2019-11-30 NOTE — Progress Notes (Signed)
Speech Language Pathology Daily Session Note  Patient Details  Name: Charles Sherman MRN: [id number] Date of Birth: April 24, 1945  Today's Date: 11/30/2019 SLP Individual Time: 1430-1520 SLP Individual Time Calculation (min): 50 min  Short Term Goals: Week 1: SLP Short Term Goal 1 (Week 1): Pt will consume trials of regular textures X2, demonstrating efficient mastication and oral clearance with no more than Min A verbal cues for use of swallow strategies prior to advancement. SLP Short Term Goal 2 (Week 1): Pt will demonstrate ability to problem solve basic to mildly complex tasks with Mod A verbal/visual cues. SLP Short Term Goal 3 (Week 1): Pt will detect functional errors with Mod A verbal/visual cues. SLP Short Term Goal 4 (Week 1): Pt will scan left visual field or attend to objects in left side of environment in 8/10 opportunities with Max A multimodal cues. SLP Short Term Goal 5 (Week 1): Pt will recall 2 safety precautions with Mod A verbal/visual cues. SLP Short Term Goal 6 (Week 1): Pt will selectively attend to functional tasks with Min A verbal and visual cues.  Skilled Therapeutic Interventions:   Patient seen for skilled ST session focusing on cognitive-linguistic goals, and more specifically on awareness to left sided visual neglect. Patient did demonstrate improved attention to picture stimuli in left visual field following mod-max frequency and intensity of trials with SLP using auditory stimuli, tactile stimuli and visual cues (bright and contrasting colors on left). Patient able to read sentences with mod-maxA for attention to left visual field when reading printed text on dry erase board. He was oriented to basic time/place/situation but does not show adequate awareness to his deficits. When SLP bringing patient back to his room, he said he needed to use the bathroom and when in his room(he is in a tilt-n-space WC) he asked, "could you bring me close to the toilet and I can  start to get in there?" Patient continues to benefit from skilled SLP intervention to maximize cognitive-linguistic and swallow function goals prior to discharge.   Pain Pain Assessment Pain Scale: 0-10 Pain Score: 0-No pain  Therapy/Group: Individual Therapy  Sonia Baller, MA, CCC-SLP Speech Therapy

## 2019-11-30 NOTE — Progress Notes (Addendum)
[place] PHYSICAL MEDICINE & REHABILITATION PROGRESS NOTE   Subjective/Complaints:  Lethargic but awakens to voice.  Per CNA, ate ~50% breakfast reviewed I/O , poor po fluid intake   ROS- denies CP, SOB, N/V/D + constipation   Objective:   No results found. Recent Labs    11/29/19 0557  WBC 11.9*  HGB 12.0*  HCT 34.9*  PLT 175   Recent Labs    11/29/19 0557  NA 136  K 4.1  CL 99  CO2 30  GLUCOSE 109*  BUN 29*  CREATININE 1.16  CALCIUM 8.6*    Intake/Output Summary (Last 24 hours) at 11/30/2019 0846 Last data filed at 11/30/2019 0757 Gross per 24 hour  Intake 480 ml  Output 2500 ml  Net -2020 ml        Physical Exam: Vital Signs Blood pressure 125/60, pulse (!) 52, temperature 98 F (36.7 C), resp. rate 18, height 5\' 11"  (1.803 m), weight 110.8 kg, SpO2 100 %.  General: No acute distress Mood and affect are appropriate Heart: Regular rate and rhythm no rubs murmurs or extra sounds Lungs: Clear to auscultation, breathing unlabored, no rales or wheezes Abdomen: Positive bowel sounds, soft nontender to palpation, nondistended Extremities: No clubbing, cyanosis, or edema Skin: No evidence of breakdown, no evidence of rash Neurologic: Cranial nerves II through XII intact, motor strength is 5/5 in RIght ,deltoid, bicep, tricep, grip, hip flexor, knee extensors, ankle dorsiflexor and plantar flexor LUE 2- elbow flex/ext 0/5 hand, LLE trace left hip/knee ext synergy o/w 0 Sensory exam normal sensation to light touch  bilateral upper and lower extremities  Musculoskeletal: Full range of motion in all 4 extremities. No joint swelling    Assessment/Plan: 1. Functional deficits secondary to Right ICA distribution infarcts  which require 3+ hours per day of interdisciplinary therapy in a comprehensive inpatient rehab setting.  Physiatrist is providing close team supervision and 24 hour management of active medical problems listed below.  Physiatrist and rehab  team continue to assess barriers to discharge/monitor patient progress toward functional and medical goals  Care Tool:  Bathing    Body parts bathed by patient: Chest, Abdomen, Left arm, Face         Bathing assist Assist Level: 2 Helpers (supine to sit)     Upper Body Dressing/Undressing Upper body dressing   What is the patient wearing?: Pull over shirt    Upper body assist Assist Level: Dependent - Patient 0%    Lower Body Dressing/Undressing Lower body dressing      What is the patient wearing?: Incontinence brief, Pants     Lower body assist Assist for lower body dressing: 2 Helpers     Toileting Toileting    Toileting assist Assist for toileting: 2 Helpers     Transfers Chair/bed transfer  Transfers assist           Locomotion Ambulation   Ambulation assist   Ambulation activity did not occur: Safety/medical concerns          Walk 10 feet activity   Assist           Walk 50 feet activity   Assist           Walk 150 feet activity   Assist           Walk 10 feet on uneven surface  activity   Assist Walk 10 feet on uneven surfaces activity did not occur: Safety/medical concerns         Wheelchair  Assist Will patient use wheelchair at discharge?: Yes   Wheelchair activity did not occur: Safety/medical concerns         Wheelchair 50 feet with 2 turns activity    Assist            Wheelchair 150 feet activity     Assist          Blood pressure 125/60, pulse (!) 52, temperature 98 F (36.7 C), resp. rate 18, height 5\' 11"  (1.803 m), weight 110.8 kg, SpO2 100 %.      Medical Problem List and Plan: 1.  Left-sided weakness and dysarthria secondary to scattered right MCA and ACA infarcts in the setting of right ICA bulb and siphon stenosis and right MCA narrowing due to large right meningioma.  Status post right ICA revascularization with stent assisted angioplasty 11/21/2019              -patient may shower but incision must be covered.             -ELOS/Goals: 2-3 weeks 2.  Antithrombotics: -DVT/anticoagulation: Lovenox             -antiplatelet therapy: Aspirin 81 mg daily and Brilinta 90 mg twice daily 3. Pain Management: Well controlled 4. Mood: Provide emotional support             -antipsychotic agents: N/A 5. Neuropsych: This patient is capable of making decisions on his own behalf. 6. Skin/Wound Care: Routine skin checks 7. Fluids/Electrolytes/Nutrition: Routine in and outs with follow-up chemistries 8.  Right medial sphenoid wing meningioma.  Followed by neurosurgery.  Decadron protocol.  No current indications for surgical intervention at this time 9.  Hypertension.  Norvasc 10 mg daily, Zaroxolyn 2.5 mg 1/day on Monday, Lopressor 12.5 mg twice daily, Imdur 120 mg daily. BP is well controlled to soft.  Vitals:   11/30/19 0446 11/30/19 0737  BP: 125/60   Pulse: (!) 51 (!) 52  Resp: 18   Temp: 98 F (36.7 C)   SpO2: 100%     10.  Diastolic congestive heart failure.  metoprolol, Lasix 40 mg twice daily.  Monitor for any signs of fluid overload, I/O neg 3L since admit, will reduce lasix to 20mg  BID 11.  Hyperlipidemia.  Lipitor 12.  History of COPD/ tobacco abuse.  Counseling.  Albuterol inhaler as needed 13.  GERD.  Protonix 14.  Dysphagia.  Dysphagia #2 thin liquids.  Follow-up speech therapy 15. Bradycardic:on BB monitor TID., D/C BB  , checked 10/8 EKG sinus brady rate 49  LOS: 2 days A FACE TO FACE EVALUATION WAS PERFORMED  Charlett Blake 11/30/2019, 8:46 AM

## 2019-11-30 NOTE — IPOC Note (Signed)
Overall Plan of Care [facility]) Patient Details Name: Charles Sherman MRN: [id number] DOB: 1945-05-03  Admitting Diagnosis: Cerebral thrombosis with cerebral infarction [facility])  Hospital Problems: Principal Problem:   Cerebral thrombosis with cerebral infarction Active Problems:   Right middle cerebral artery stroke [facility])     Functional Problem List: Nursing Bladder, Bowel, Perception, Endurance  PT Balance, Safety, Endurance, Motor  OT Balance, Cognition, Endurance, Motor, Sensory, Safety, Perception, Vision  SLP Cognition, Nutrition  TR         Basic ADL's: OT Grooming, Bathing, Dressing, Toileting     Advanced  ADL's: OT       Transfers: PT Bed Mobility, Bed to Chair, Car, Manufacturing systems engineer, Metallurgist: PT Ambulation, Stairs     Additional Impairments: OT Fuctional Use of Upper Extremity  SLP Swallowing, Social Cognition   Awareness, Problem Solving, Attention  TR      Anticipated Outcomes Item Anticipated Outcome  Self Feeding setup assist  Swallowing      Basic self-care  min to mod assist  Toileting  min to mod assist   Bathroom Transfers min assist  Bowel/Bladder  to be continent x 2  Transfers  mod A  Locomotion  mod A  Communication     Cognition  Supervision A  Pain  less than 3 out of 10 on pain scale  Safety/Judgment  to remain fall free while in rehab   Therapy Plan: PT Intensity: Minimum of 1-2 x/day ,45 to 90 minutes PT Frequency: 5 out of 7 days PT Duration Estimated Length of Stay: 4 weeks OT Intensity: Minimum of 1-2 x/day, 45 to 90 minutes OT Frequency: 5 out of 7 days OT Duration/Estimated Length of Stay: 24-28 days SLP Intensity: Minumum of 1-2 x/day, 30 to 90 minutes SLP Frequency: 3 to 5 out of 7 days SLP Duration/Estimated Length of Stay: 3-4 weeks   Due to the current state of emergency, patients may not be receiving their 3-hours of Medicare-mandated therapy.   Team Interventions: Nursing  Interventions Patient/Family Education, Disease Management/Prevention, Discharge Planning, Bladder Management, Cognitive Remediation/Compensation, Bowel Management, Medication Management, Dysphagia/Aspiration Precaution Training  PT interventions Ambulation/gait training, Discharge planning, DME/adaptive equipment instruction, Functional mobility training, Therapeutic Activities, UE/LE Strength taining/ROM, Medical illustrator training, Neuromuscular re-education, Patient/family education, Stair training, Therapeutic Exercise, UE/LE Coordination activities, Wheelchair propulsion/positioning  OT Interventions Training and development officer, Cognitive remediation/compensation, Academic librarian, Engineer, drilling, Disease mangement/prevention, Discharge planning, Functional electrical stimulation, Functional mobility training, Neuromuscular re-education, Psychosocial support, Patient/family education, Pain management, Self Care/advanced ADL retraining, UE/LE Coordination activities, Therapeutic Activities, Therapeutic Exercise, Visual/perceptual remediation/compensation, Splinting/orthotics, UE/LE Strength taining/ROM, Wheelchair propulsion/positioning  SLP Interventions Cognitive remediation/compensation, Cueing hierarchy, Functional tasks, Patient/family education, Dysphagia/aspiration precaution training, Internal/external aids  TR Interventions    SW/CM Interventions Discharge Planning, Psychosocial Support, Patient/Family Education   Barriers to Discharge MD  Medical stability, Incontinence and Weight  Nursing      PT Inaccessible home environment 3 STE, non-ambulatory.  OT      SLP      SW Decreased caregiver support, Lack of/limited family support Spouse has medical concerns   Team Discharge Planning: Destination: PT-Home ,OT- Home , SLP-Home Projected Follow-up: PT-Home health PT, OT-  Home health OT, 24 hour supervision/assistance, SLP-24 hour supervision/assistance,  Home Health SLP Projected Equipment Needs: PT-To be determined, OT- To be determined, SLP-None recommended by SLP Equipment Details: PT- , OT-  Patient/family involved in discharge planning: PT- Patient,  OT-Patient, SLP-Patient  MD ELOS: 18-21d Medical Rehab Prognosis:  Fair Assessment:  74 year old right-handed male with history of diastolic congestive heart failure, hypertension, OSA on CPAP, tobacco abuse as well as 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  Per chart review lives with spouse.  1 level home 2 steps to entry.  Reportedly independent prior to admission and active.  Presented 11/14/2019 with left-sided weakness.  Cranial CT scan showed acute infarct right MCA territory in the right frontal parietal lobe extending into the occipital lobe.  No acute hemorrhage.  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  There was some vasogenic edema in the right temporal parietal lobe 9 mm midline shift to the left again unchanged.  CT angiogram of head and neck with extensive atherosclerotic calcification in the cavernous carotids bilaterally with mild stenosis.  The tumor abuts the right cavernous and supraclinoid internal carotid artery without encasement or occlusion.  Neurosurgery did review scanned no current plan for arteriography or stenting at this time.  Admission chemistries glucose 121 WBC 15,000.  Echocardiogram showed no significant regurgitation or distraction of the valve to suggest endocarditis.  Left ventricular ejection fraction 60 to 65% no wall motion abnormalities grade 1 diastolic dysfunction.  No current plan for TEE after echocardiogram reviewed by cardiology services.  Cerebral angiogram completed showing high-grade stenosis of approximately 75% associated with soft smooth plaque.  Approximate 60% stenosis of right VA proximal and 60% of left VA origin.  Mass-effect on right MCA secondary to large sphenoid wing meningioma  without stenosis or intraluminal filling defect.  Patient did undergo right ICA revascularization with stent assisted angioplasty 11/21/2019.  Patient currently maintained on aspirin as well as Brilinta and he was cleared to begin Lovenox for DVT prophylaxis.  Completing Decadron protocol.  Currently on a dysphagia #2 thin liquid diet   See Team Conference Notes for weekly updates to the plan of care

## 2019-11-30 NOTE — Progress Notes (Signed)
Speech Language Pathology Daily Session Note  Patient Details  Name: Charles Sherman MRN: [id number] Date of Birth: 1945-11-15  Today's Date: 12/02/2019 SLP Individual Time: 6314-9702 SLP Individual Time Calculation (min): 41 min  Short Term Goals: Week 1: SLP Short Term Goal 1 (Week 1): Pt will consume trials of regular textures X2, demonstrating efficient mastication and oral clearance with no more than Min A verbal cues for use of swallow strategies prior to advancement. SLP Short Term Goal 2 (Week 1): Pt will demonstrate ability to problem solve basic to mildly complex tasks with Mod A verbal/visual cues. SLP Short Term Goal 3 (Week 1): Pt will detect functional errors with Mod A verbal/visual cues. SLP Short Term Goal 4 (Week 1): Pt will scan left visual field or attend to objects in left side of environment in 8/10 opportunities with Max A multimodal cues. SLP Short Term Goal 5 (Week 1): Pt will recall 2 safety precautions with Mod A verbal/visual cues. SLP Short Term Goal 6 (Week 1): Pt will selectively attend to functional tasks with Min A verbal and visual cues.  Skilled Therapeutic Interventions: Pt was seen for skilled ST targeting cognitive goals. Pt requested calendar to better orient to date, therefore SLP provided one as external aid on wall. Pt required Moderate verbal and visual cues for visual scanning left to right as well as problem solving and error awareness during a basic weekly weather forecast interpretation task. Pt also required Moderate verbal and visual cues for problem solving and initiation during a novel basic card task (11 up). Pt left sitting in tilt in space chair with alarm set and needs within reach. Continue per current plan of care.          Pain Pain Assessment Pain Scale: 0-10 Pain Score: 0-No pain  Therapy/Group: Individual Therapy  Charles Sherman 12/02/2019, 12:09 PM

## 2019-11-30 NOTE — Progress Notes (Signed)
Physical Therapy Session Note  Patient Details  Name: Charles Sherman MRN: [id number] Date of Birth: 18-Apr-1945  Today's Date: 11/30/2019 PT Individual Time: 1100-1200 PT Individual Time Calculation (min): 60 min   Short Term Goals: Week 1:  PT Short Term Goal 1 (Week 1): Pt will transfer sup to sit w/ max A of 1. PT Short Term Goal 2 (Week 1): Pt will sit EOB w/ mod A x 3' PT Short Term Goal 3 (Week 1): Pt will transfer sit to stand w/ Stedy and max A w/ improved midline standing position.  Skilled Therapeutic Interventions/Progress Updates:    Patient received sitting up in wc agreeable to PT. He denies pain, but endorses fatigue. PT propelling patient to therapy gym in [place]. Transfer to therapy mat via slideboard ModA x2 with verbal cues for weight shifts. Patient able to remain sitting edge of mat with ModA x1-2 for safety, mirror for visual feedback and verbal cues for weight shift R. Patient able to complete reaching task outside base of support to R to encourage equal weight distribution in static sitting. STS x8, 3 musketeers assist x2. Manual facilitation for L knee extension, mirror for visual feedback and verbal/tactile cues for upright posture. Patient able to maintain fully erect posture x~10s, but is easily distracted by other people in therapy gym resulting in him assuming flexed posture and poor L knee control. PT placing 1 3/4" step under R LE to bias L LE for STS with good success. Patient able to achieve full L knee extension in standing with intermittent verbal cuing to engage L quad. Patient returning to wc via slideboard and [place] x2. Patient able to propel wc using B LE in TIS wc x15 ft with verbal cuing and ModA tactile cuing to engage L LE. Patient returning to room in TIS wc, seatbelt alarm on, call light within reach.   Therapy Documentation Precautions:  Precautions Precautions: Fall Precaution Comments: left neglect, left hemiparesis, slight pusher to the  left Restrictions Weight Bearing Restrictions: No    Therapy/Group: Individual Therapy  Karoline Caldwell, PT, DPT, CBIS 11/30/2019, 7:40 AM

## 2019-11-30 NOTE — Progress Notes (Signed)
Occupational Therapy Session Note  Patient Details  Name: Charles Sherman MRN: [id number] Date of Birth: [birth year]  Today's Date: 11/30/2019 OT Individual Time: 3335-4562 OT Individual Time Calculation (min): 65 min    Short Term Goals: Week 1:  OT Short Term Goal 1 (Week 1): Pt will maintain static sitting balance EOB or EOM with min guard assist for 3 mins in preparation for selfcare tasks. OT Short Term Goal 2 (Week 1): Pt will complete UB bathing with min assist in supported sitting for 2 consecutive sessions. OT Short Term Goal 3 (Week 1): Pt will complete LB dressing sit to stand with max assist of 1 for 2 consecutive sessions. OT Short Term Goal 4 (Week 1): Pt will complete UB dressing with mod assist sitting unsupported.  Skilled Therapeutic Interventions/Progress Updates:    Pt completed supine to sit EOB to start session with overall max assist.  He needed min assist for static sitting but still exhibited increased lean to the left and right head turn.  He next completed squat pivot transfer to the wheelchair with overall max to total assist to the right.  Decreased ability to control descent.  He was transferred over to the sink for bathing and dressing.  Max instructional cueing for sequencing bathing with max hand over hand for washing the RUE with the LUE.  He needed max assist for sit to stand from the wheelchair when washing his buttocks and peri area as well.  Slight bowel incontinence noted in the brief.  He completed UB dressing with max assist and then completed LB dressing with overall total assist.  Increased lean/pushing to the left while sitting with max demonstrational cueing to correct.  He still maintained right eye closed throughout session with left visual field cut noted when having him try to find the washcloth or towel presented on the left side.  Finished session with pt in the tilt in space wheelchair with the call button and phone in reach and safety belt in  place.    Therapy Documentation Precautions:  Precautions Precautions: Fall Precaution Comments: left neglect, left hemiparesis, slight pusher to the left Restrictions Weight Bearing Restrictions: No  Pain: Pain Assessment Pain Scale: Faces Pain Score: 0-No pain ADL: See Care Tool Section for some details of mobility and selfcare  Therapy/Group: Individual Therapy  Tegan Britain OTR/L 11/30/2019, 12:31 PM

## 2019-11-30 NOTE — Progress Notes (Signed)
Right eye- bottom interior eye lid noted to be turned outward. Notified PA, new orders received.

## 2019-12-01 NOTE — Progress Notes (Signed)
[place] PHYSICAL MEDICINE & REHABILITATION PROGRESS NOTE   Subjective/Complaints:   \Pt reports "i'm good", but admits to dry mouth.  Denies having fluids.   LBM yesterday.   Denies pain.    ROS-   Pt denies SOB, abd pain, CP, N/V/C/D, and vision changes   Objective:   No results found. Recent Labs    11/29/19 0557  WBC 11.9*  HGB 12.0*  HCT 34.9*  PLT 175   Recent Labs    11/29/19 0557  NA 136  K 4.1  CL 99  CO2 30  GLUCOSE 109*  BUN 29*  CREATININE 1.16  CALCIUM 8.6*    Intake/Output Summary (Last 24 hours) at 12/01/2019 1354 Last data filed at 12/01/2019 1245 Gross per 24 hour  Intake 415 ml  Output 875 ml  Net -460 ml        Physical Exam: Vital Signs Blood pressure (!) 96/52, pulse (!) 56, temperature 98.1 F (36.7 C), resp. rate 19, height 5\' 11"  (1.803 m), weight 110.8 kg, SpO2 98 %.  General: No acute distress; licking lips/c/p dry mouth, denies having fluids, NAD Mood and affect are appropriate Heart: borderline bradycardia- regular rhythm Lungs: CTA B/L- no W/R/R- good air movement Abdomen: Soft, NT, ND, (+)BS  Extremities: No clubbing, cyanosis, or edema Skin: No evidence of breakdown, no evidence of rash Neurologic: Cranial nerves II through XII intact, motor strength is 5/5 in RIght ,deltoid, bicep, tricep, grip, hip flexor, knee extensors, ankle dorsiflexor and plantar flexor LUE 2- elbow flex/ext 0/5 hand, LLE trace left hip/knee ext synergy o/w 0 Sensory exam normal sensation to light touch  bilateral upper and lower extremities  Musculoskeletal: Full range of motion in all 4 extremities. No joint swelling    Assessment/Plan: 1. Functional deficits secondary to Right ICA distribution infarcts  which require 3+ hours per day of interdisciplinary therapy in a comprehensive inpatient rehab setting.  Physiatrist is providing close team supervision and 24 hour management of active medical problems listed below.  Physiatrist and  rehab team continue to assess barriers to discharge/monitor patient progress toward functional and medical goals  Care Tool:  Bathing    Body parts bathed by patient: Right arm, Chest, Abdomen   Body parts bathed by helper: Front perineal area, Buttocks, Right lower leg, Left lower leg, Face, Left arm     Bathing assist Assist Level: Maximal Assistance - Patient 24 - 49%     Upper Body Dressing/Undressing Upper body dressing   What is the patient wearing?: Pull over shirt    Upper body assist Assist Level: Maximal Assistance - Patient 25 - 49%    Lower Body Dressing/Undressing Lower body dressing      What is the patient wearing?: Incontinence brief, Pants     Lower body assist Assist for lower body dressing: Total Assistance - Patient < 25%     Toileting Toileting    Toileting assist Assist for toileting: 2 Helpers     Transfers Chair/bed transfer  Transfers assist     Chair/bed transfer assist level: Total Assistance - Patient < 25%     Locomotion Ambulation   Ambulation assist   Ambulation activity did not occur: Safety/medical concerns          Walk 10 feet activity   Assist           Walk 50 feet activity   Assist           Walk 150 feet activity   Assist  Walk 10 feet on uneven surface  activity   Assist Walk 10 feet on uneven surfaces activity did not occur: Safety/medical concerns         Wheelchair     Assist Will patient use wheelchair at discharge?: Yes Type of Wheelchair: Manual Wheelchair activity did not occur: Safety/medical concerns  Wheelchair assist level: Total Assistance - Patient < 25%      Wheelchair 50 feet with 2 turns activity    Assist        Assist Level: Total Assistance - Patient < 25%   Wheelchair 150 feet activity     Assist      Assist Level: Total Assistance - Patient < 25%   Blood pressure (!) 96/52, pulse (!) 56, temperature 98.1 F (36.7 C),  resp. rate 19, height 5\' 11"  (1.803 m), weight 110.8 kg, SpO2 98 %.      Medical Problem List and Plan: 1.  Left-sided weakness and dysarthria secondary to scattered right MCA and ACA infarcts in the setting of right ICA bulb and siphon stenosis and right MCA narrowing due to large right meningioma.  Status post right ICA revascularization with stent assisted angioplasty 11/21/2019             -patient may shower but incision must be covered.             -ELOS/Goals: 2-3 weeks 2.  Antithrombotics: -DVT/anticoagulation: Lovenox             -antiplatelet therapy: Aspirin 81 mg daily and Brilinta 90 mg twice daily 3. Pain Management: Well controlled 4. Mood: Provide emotional support             -antipsychotic agents: N/A 5. Neuropsych: This patient is capable of making decisions on his own behalf. 6. Skin/Wound Care: Routine skin checks 7. Fluids/Electrolytes/Nutrition: Routine in and outs with follow-up chemistries 8.  Right medial sphenoid wing meningioma.  Followed by neurosurgery.  Decadron protocol.  No current indications for surgical intervention at this time 9.  Hypertension.  Norvasc 10 mg daily, Zaroxolyn 2.5 mg 1/day on Monday, Lopressor 12.5 mg twice daily, Imdur 120 mg daily. BP is well controlled to soft  10/9- BP low/soft- still low- will monitor for trends Vitals:   12/01/19 0547 12/01/19 1244  BP: (!) 126/55 (!) 96/52  Pulse: (!) 55 (!) 56  Resp: 18 19  Temp:  98.1 F (36.7 C)  SpO2: 97% 98%    10.  Diastolic congestive heart failure.  metoprolol, Lasix 40 mg twice daily.  Monitor for any signs of fluid overload, I/O neg 3L since admit, will reduce lasix to 20mg  BID  10/9- BUN 29- will recheck Monday 11.  Hyperlipidemia.  Lipitor 12.  History of COPD/ tobacco abuse.  Counseling.  Albuterol inhaler as needed 13.  GERD.  Protonix 14.  Dysphagia.  Dysphagia #2 thin liquids.  Follow-up speech therapy 15. Bradycardic:on BB monitor TID., D/C BB  , checked 10/8 EKG sinus  brady rate 49  10/9- Pulse 50s- off BB  LOS: 3 days A FACE TO FACE EVALUATION WAS PERFORMED  Chace Bisch 12/01/2019, 1:54 PM

## 2019-12-02 ENCOUNTER — Inpatient Hospital Stay (HOSPITAL_COMMUNITY): Payer: No Typology Code available for payment source

## 2019-12-02 ENCOUNTER — Inpatient Hospital Stay (HOSPITAL_COMMUNITY): Payer: No Typology Code available for payment source | Admitting: Speech Pathology

## 2019-12-02 NOTE — Progress Notes (Signed)
[facility] PHYSICAL MEDICINE & REHABILITATION PROGRESS NOTE   Subjective/Complaints:   \ LBM 2 days ago- denies constipation- doesn't want meds.   Doing "OK".    ROS-    Pt denies SOB, abd pain, CP, N/V/C/D, and vision changes   Objective:   No results found. No results for input(s): WBC, HGB, HCT, PLT in the last 72 hours. No results for input(s): NA, K, CL, CO2, GLUCOSE, BUN, CREATININE, CALCIUM in the last 72 hours.  Intake/Output Summary (Last 24 hours) at 12/02/2019 1352 Last data filed at 12/02/2019 1255 Gross per 24 hour  Intake 700 ml  Output --  Net 700 ml        Physical Exam: Vital Signs Blood pressure 128/78, pulse (!) 51, temperature 98.3 F (36.8 C), temperature source Oral, resp. rate 18, height 5\' 11"  (1.803 m), weight 110.8 kg, SpO2 100 %.  General: No acute distress; sitting up in bedside chair, appears cold, covered up with blankets, NAD Mood and affect are appropriate Heart: bradycardia- regular rhythm Lungs: CTA B/L- no W/R/R- good air movement Abdomen: Soft, NT, ND, (+)BS hypoactive Extremities: No clubbing, cyanosis, or edema Skin: No evidence of breakdown, no evidence of rash Mild wooding/wrinkling of L hand due to decreased edema Neurologic: Cranial nerves II through XII intact, motor strength is 5/5 in RIght ,deltoid, bicep, tricep, grip, hip flexor, knee extensors, ankle dorsiflexor and plantar flexor LUE 2- elbow flex/ext 0/5 hand, LLE trace left hip/knee ext synergy o/w 0 Sensory exam normal sensation to light touch  bilateral upper and lower extremities  Musculoskeletal: Full range of motion in all 4 extremities. No joint swelling    Assessment/Plan: 1. Functional deficits secondary to Right ICA distribution infarcts  which require 3+ hours per day of interdisciplinary therapy in a comprehensive inpatient rehab setting.  Physiatrist is providing close team supervision and 24 hour management of active medical problems listed  below.  Physiatrist and rehab team continue to assess barriers to discharge/monitor patient progress toward functional and medical goals  Care Tool:  Bathing    Body parts bathed by patient: Right arm, Chest, Abdomen   Body parts bathed by helper: Front perineal area, Buttocks, Right lower leg, Left lower leg, Face, Left arm     Bathing assist Assist Level: Maximal Assistance - Patient 24 - 49%     Upper Body Dressing/Undressing Upper body dressing   What is the patient wearing?: Pull over shirt    Upper body assist Assist Level: Maximal Assistance - Patient 25 - 49%    Lower Body Dressing/Undressing Lower body dressing      What is the patient wearing?: Incontinence brief, Pants     Lower body assist Assist for lower body dressing: Total Assistance - Patient < 25%     Toileting Toileting    Toileting assist Assist for toileting: 2 Helpers     Transfers Chair/bed transfer  Transfers assist     Chair/bed transfer assist level: Total Assistance - Patient < 25%     Locomotion Ambulation   Ambulation assist   Ambulation activity did not occur: Safety/medical concerns          Walk 10 feet activity   Assist           Walk 50 feet activity   Assist           Walk 150 feet activity   Assist           Walk 10 feet on uneven surface  activity  Assist Walk 10 feet on uneven surfaces activity did not occur: Safety/medical concerns         Wheelchair     Assist Will patient use wheelchair at discharge?: Yes Type of Wheelchair: Manual Wheelchair activity did not occur: Safety/medical concerns  Wheelchair assist level: Total Assistance - Patient < 25%      Wheelchair 50 feet with 2 turns activity    Assist        Assist Level: Total Assistance - Patient < 25%   Wheelchair 150 feet activity     Assist      Assist Level: Total Assistance - Patient < 25%   Blood pressure 128/78, pulse (!) 51,  temperature 98.3 F (36.8 C), temperature source Oral, resp. rate 18, height 5\' 11"  (1.803 m), weight 110.8 kg, SpO2 100 %.      Medical Problem List and Plan: 1.  Left-sided weakness and dysarthria secondary to scattered right MCA and ACA infarcts in the setting of right ICA bulb and siphon stenosis and right MCA narrowing due to large right meningioma.  Status post right ICA revascularization with stent assisted angioplasty 11/21/2019             -patient may shower but incision must be covered.             -ELOS/Goals: 2-3 weeks 2.  Antithrombotics: -DVT/anticoagulation: Lovenox             -antiplatelet therapy: Aspirin 81 mg daily and Brilinta 90 mg twice daily 3. Pain Management: Well controlled 4. Mood: Provide emotional support             -antipsychotic agents: N/A 5. Neuropsych: This patient is capable of making decisions on his own behalf. 6. Skin/Wound Care: Routine skin checks 7. Fluids/Electrolytes/Nutrition: Routine in and outs with follow-up chemistries 8.  Right medial sphenoid wing meningioma.  Followed by neurosurgery.  Decadron protocol.  No current indications for surgical intervention at this time 9.  Hypertension.  Norvasc 10 mg daily, Zaroxolyn 2.5 mg 1/day on Monday, Lopressor 12.5 mg twice daily, Imdur 120 mg daily. BP is well controlled to soft  10/9- BP low/soft- still low- will monitor for trends  10/10- BP 120s/50s- pulse 50s- off Lopressor/BB- pulse still low Vitals:   12/02/19 0617 12/02/19 0722  BP: (!) 124/55 128/78  Pulse: (!) 51   Resp: 18   Temp: 98.3 F (36.8 C)   SpO2: 100%     10.  Diastolic congestive heart failure.  metoprolol, Lasix 40 mg twice daily.  Monitor for any signs of fluid overload, I/O neg 3L since admit, will reduce lasix to 20mg  BID  10/10- BUN 29 recheck Monday 11.  Hyperlipidemia.  Lipitor 12.  History of COPD/ tobacco abuse.  Counseling.  Albuterol inhaler as needed 13.  GERD.  Protonix 14.  Dysphagia.  Dysphagia #2  thin liquids.  Follow-up speech therapy 15. Bradycardic:on BB monitor TID., D/C BB  , checked 10/8 EKG sinus brady rate 49  10/9- Pulse 50s- off BB  10/10- Pulse 51- asymptomatic except "feels cold".   LOS: 4 days A FACE TO FACE EVALUATION WAS PERFORMED  Kloee Ballew 12/02/2019, 1:52 PM

## 2019-12-02 NOTE — Plan of Care (Signed)
  Problem: RH SKIN INTEGRITY Goal: RH STG SKIN FREE OF INFECTION/BREAKDOWN Description: Skin will be free of infection/breakdown with min assist Outcome: Progressing Goal: RH STG MAINTAIN SKIN INTEGRITY WITH ASSISTANCE Description: STG Maintain Skin Integrity With min Assistance. Outcome: Progressing   Problem: RH SAFETY Goal: RH STG ADHERE TO SAFETY PRECAUTIONS W/ASSISTANCE/DEVICE Description: STG Adhere to Safety Precautions With min Assistance/Device. Outcome: Progressing

## 2019-12-02 NOTE — Progress Notes (Signed)
Physical Therapy Session Note  Patient Details  Name: Charles Sherman MRN: [id number] Date of Birth: [birth year]  Today's Date: 12/02/2019 PT Individual Time: 1050-1200 and 1300-1330 PT Individual Time Calculation (min): 70 min and 30 min   Short Term Goals: Week 1:  PT Short Term Goal 1 (Week 1): Pt will transfer sup to sit w/ max A of 1. PT Short Term Goal 2 (Week 1): Pt will sit EOB w/ mod A x 3' PT Short Term Goal 3 (Week 1): Pt will transfer sit to stand w/ Stedy and max A w/ improved midline standing position.  Skilled Therapeutic Interventions/Progress Updates:     Session 1: Patient in TIS w/c upon PT arrival. Patient alert and agreeable to PT session. Patient denied pain during session.  Therapeutic Activity: Bed Mobility: Patient performed sit to supine with max A +2 due to fatigue. Provided verbal cues for performing log roll technique for improved trunk control and bringing B knees to chest to lift LEs onto the bed. Transfers: Patient performed slide board transfers w/c>mat table and w/c>bed to the R with max A +2 and mat table>w/c to the L with total A +2 due to increased L lean and decreased motor planning. Required total A for board placement. Provided cues for hand placement, board placement, and head-hips relationship for proper technique and decreased assist with transfers.  Patient performed sit to/from stand x2 with mod-max A +2 using 3 musketeers technique x1 and B HHA x1. He performed sit to/from stand using R rail x3 Provided multimodal cues for forward and R weight shift, L knee/hip extension, and trunk extension.  Wheelchair Mobility:  Patient was transported in the [place] w/c with total A throughout session for energy conservation and time management.  Neuromuscular Re-ed: Patient performed the following sitting and standing balance activities: -sitting balance EOM focused on midline orientation using a mirror for visual feedback and multimodal cues for R trunk  shift, L trunk elongation, erect posture, and midline or L visual gaze (required max cues to maintain gaze away from right gaze preference) -sitting lateral trunk leans to the L with use of L UE with approximation to the hand and wrist to return to midline, patient compensating with use of R UE pulling on R knee, PT held R hand to eliminate compensation, required min A-CGA for balance and use of mirror to find midline with heavy cues -standing balance with 3 musketeers x1 and B HHA x1 with focus on erect posture, midline orientation using a mirror, equal weight bearing with L knee extension (PT blocking L knee throughout to prevent buckling), and hip extension, maintained standing balance with max A +2 20-30 seconds each trial -standing balance holding R rail x3, focused on 3 cues (looking in the mirror to promote erect posture and midline orientation, L side elongation for improved L knee and trunk extension, and L weight shift for improved L LE weight bearing and quad/hamstring activation), maintained standing balance 30-40 sec each trial with mod A of 1-2 for increased manual facilitation to follow cues  Patient in bed at end of session with breaks locked, bed alarm set, and all needs within reach.   Session 2: Patient in bed upon PT arrival. Patient alert and agreeable to PT session. Patient denied pain during session. Patient reported feeling motivated after last session. Focused session on L UE/LE NMR for improved motor control.   Therapeutic Activity: Bed Mobility: Patient incontinent of bladder during session, performed rolling R/L with min-mod A and  use of bed rails x3 for improved functional mobility in the bed. Provided verbal cues for bending opposite LE to push during roll, reaching with R UE to the rail when rolling L, and bringing L hand across using R with R head turn to roll R. Required total A for peri-care and donning/doffing pants and incontinence brief during rolling.    Neuromuscular Re-ed: Patient performed the following UE/LE motor control activities in supine: -L hip flexion/extension 2x10 progressing from mod A-min A through full range and mild resistance for extension pattern -B isometric hip adduction 2x5, PT's fist between patient's knees for manual facilitation to reduce R LE overpowering L and encourage overflow to L LE for increased motor activation, progressed from <1 sec hold to 4 sec hold -L gross grip x8 with trace activation following repetitive trials -L elbow flexion/extension 2x10 progressing from AROM throughout half of range to AROM throughout full range with mild resistance -L shoulder ER/IR x8 with trace activation and prolonged stretch, 30 sec x2, in ER due to tightness -L shoulder flexion/extension 2x10 progressing from PROM to [facility] with mod-max A to complete full range, shoulder flexion ~120 deg before firm end feel  Educated patient on stroke recovery and increased use and attention to his L side of his body and visual field to promote motor recovery. Patient very attentive and appreciative of education.   Patient in bed at end of session with breaks locked, bed alarm set, and all needs within reach.    Therapy Documentation Precautions:  Precautions Precautions: Fall Precaution Comments: left neglect, left hemiparesis, slight pusher to the left Restrictions Weight Bearing Restrictions: No   Therapy/Group: Individual Therapy  Aislyn Hayse L Maisie Hauser PT, DPT  12/02/2019, 5:01 PM

## 2019-12-02 NOTE — Progress Notes (Signed)
Occupational Therapy Session Note  Patient Details  Name: Charles Sherman MRN: [id number] Date of Birth: 05/14/1945  Today's Date: 12/02/2019 OT Individual Time: 2542-7062 OT Individual Time Calculation (min): 75 min    Short Term Goals: Week 1:  OT Short Term Goal 1 (Week 1): Pt will maintain static sitting balance EOB or EOM with min guard assist for 3 mins in preparation for selfcare tasks. OT Short Term Goal 2 (Week 1): Pt will complete UB bathing with min assist in supported sitting for 2 consecutive sessions. OT Short Term Goal 3 (Week 1): Pt will complete LB dressing sit to stand with max assist of 1 for 2 consecutive sessions. OT Short Term Goal 4 (Week 1): Pt will complete UB dressing with mod assist sitting unsupported.  Skilled Therapeutic Interventions/Progress Updates:    Pt received supine with no c/o pain, agreeable to OT session. Pt not attending to OT when standing on L side with obvious R head turn and keeping R eye shut at times. Pt was set up for breakfast by NT while OT gathered supplies for ADLs. Extra yellow fall bracelet used as anchor on the L side to encourage pt to scan to L side of breakfast tray. Distractions minimized. With this anchor and min cueing pt was able to locate coffee cup and desired food items on L side. Self feeding with (S) overall- significant spillage off L side of plate. Pt completed rolling L with min A, and R with mod A and mod cueing for technique for brief change. Pt required mod assist to complete L roll while also reaching around to complete peri hygiene. Anterior hygiene with set up assist while supine. Pants donned over legs with total A and then pt able to bridge hips to pull up on R side himself, with R roll for OT to pull up on L hip. Pt completed bed mobility to EOB with mod A using bed features. Frequent R and L LOB. Pt able to sit with CGA when holding onto bed footboard with the RUE. Charlaine Dalton was used for sit > stand with mod A, with  heavy L pushing once in standing. Pt required frequent cueing to correct L lean/push, which he was able to do only slightly. Pt was transferred to the TIS w/c. He completed oral care at the sink with min A. Mod A for UB bathing, with HOH provided for LUE to wash RUE, as well as objects for bathing placed in L visual field to promote visual scanning. Pt donned new shirt with min cueing for hemi technique, and mod A overall. Teds and new socks were donned total A. Pt was left sitting up with all needs met, chair alarm set.   Therapy Documentation Precautions:  Precautions Precautions: Fall Precaution Comments: left neglect, left hemiparesis, slight pusher to the left Restrictions Weight Bearing Restrictions: No   Therapy/Group: Individual Therapy  Curtis Sites 12/02/2019, 6:43 AM

## 2019-12-03 ENCOUNTER — Inpatient Hospital Stay (HOSPITAL_COMMUNITY): Payer: No Typology Code available for payment source | Admitting: Occupational Therapy

## 2019-12-03 ENCOUNTER — Inpatient Hospital Stay (HOSPITAL_COMMUNITY): Payer: No Typology Code available for payment source | Admitting: Speech Pathology

## 2019-12-03 ENCOUNTER — Inpatient Hospital Stay (HOSPITAL_COMMUNITY): Payer: No Typology Code available for payment source

## 2019-12-03 LAB — BASIC METABOLIC PANEL
Anion gap: 9 (ref 5–15)
BUN: 21 mg/dL (ref 8–23)
CO2: 26 mmol/L (ref 22–32)
Calcium: 8.8 mg/dL — ABNORMAL LOW (ref 8.9–10.3)
Chloride: 104 mmol/L (ref 98–111)
Creatinine, Ser: 1.15 mg/dL (ref 0.61–1.24)
GFR, Estimated: >60 mL/min (ref >60)
Glucose, Bld: 106 mg/dL — ABNORMAL HIGH (ref 70–99)
Potassium: 4.1 mmol/L (ref 3.5–5.1)
Sodium: 139 mmol/L (ref 135–145)

## 2019-12-03 LAB — CBC WITH DIFFERENTIAL/PLATELET
Abs Immature Granulocytes: 0.04 10*3/uL (ref 0.00–0.07)
Basophils Absolute: 0 10*3/uL (ref 0.0–0.1)
Basophils Relative: 0 %
Eosinophils Absolute: 0.2 10*3/uL (ref 0.0–0.5)
Eosinophils Relative: 3 %
HCT: 33.7 % — ABNORMAL LOW (ref 39.0–52.0)
Hemoglobin: 11.2 g/dL — ABNORMAL LOW (ref 13.0–17.0)
Immature Granulocytes: 1 %
Lymphocytes Relative: 15 %
Lymphs Abs: 1.2 10*3/uL (ref 0.7–4.0)
MCH: 32.1 pg (ref 26.0–34.0)
MCHC: 33.2 g/dL (ref 30.0–36.0)
MCV: 96.6 fL (ref 80.0–100.0)
Monocytes Absolute: 0.6 10*3/uL (ref 0.1–1.0)
Monocytes Relative: 8 %
Neutro Abs: 6 10*3/uL (ref 1.7–7.7)
Neutrophils Relative %: 73 %
Platelets: 159 10*3/uL (ref 150–400)
RBC: 3.49 MIL/uL — ABNORMAL LOW (ref 4.22–5.81)
RDW: 14.1 % (ref 11.5–15.5)
WBC: 8 10*3/uL (ref 4.0–10.5)
nRBC: 0 % (ref 0.0–0.2)

## 2019-12-03 NOTE — Progress Notes (Signed)
Physical Therapy Session Note  Patient Details  Name: Charles Sherman MRN: [id number] Date of Birth: 1945-06-10  Today's Date: 12/03/2019 PT Individual Time: 3383-2919 PT Individual Time Calculation (min): 73 min   Short Term Goals: Week 1:  PT Short Term Goal 1 (Week 1): Pt will transfer sup to sit w/ max A of 1. PT Short Term Goal 2 (Week 1): Pt will sit EOB w/ mod A x 3' PT Short Term Goal 3 (Week 1): Pt will transfer sit to stand w/ Stedy and max A w/ improved midline standing position.  Skilled Therapeutic Interventions/Progress Updates:    Patient received sitting up in TIS wc, agreeable to PT. Decreased swelling to R eye noted, but significant R gaze noted. PT propelling patient in wc to therapy gym for time management. Slideboard transfer to edge of mat with ModA x2 leading to the R. Initial L lateral lean and pushing with R UE noted. Slight decrease with verbal cue to place R UE into lap. Mirror provided for visual feedback with good results. Patient most responsive to verbal cue to touch his R shoulder to PTs L shoulder in sitting to maintain midline. Patient completing dynamic reaching task seated edge of mat with verbal cues to attend to object on the L and maintain midline. Patient able to self correct posture, but would lean to the L with every repetition of task. As patient fatigued, L lateral lean would worsen. Patient completing visual scanning task seated edge of mat. Increased verbal cuing needed to scan L to place card on matching card. Patient completing STS 2x5 in [place] with mirror for visual feedback. Verbal cuing to maintain midline posture in standing and for fully erect posture. Patient returning to wc via slideboard with MaxA x2 when leading L. Patient returning to room in wc, seatbelt alarm on, call light within reach.   Therapy Documentation Precautions:  Precautions Precautions: Fall Precaution Comments: left neglect, left hemiparesis, slight pusher to the  left Restrictions Weight Bearing Restrictions: No    Therapy/Group: Individual Therapy  Karoline Caldwell, PT, DPT, CBIS 12/03/2019, 7:44 AM

## 2019-12-03 NOTE — Progress Notes (Signed)
Occupational Therapy Session Note  Patient Details  Name: Charles Sherman MRN: [id number] Date of Birth: [birth year]  Today's Date: 12/03/2019 OT Individual Time: 6314-9702 OT Individual Time Calculation (min): 70 min    Short Term Goals: Week 1:  OT Short Term Goal 1 (Week 1): Pt will maintain static sitting balance EOB or EOM with min guard assist for 3 mins in preparation for selfcare tasks. OT Short Term Goal 2 (Week 1): Pt will complete UB bathing with min assist in supported sitting for 2 consecutive sessions. OT Short Term Goal 3 (Week 1): Pt will complete LB dressing sit to stand with max assist of 1 for 2 consecutive sessions. OT Short Term Goal 4 (Week 1): Pt will complete UB dressing with mod assist sitting unsupported.  Skilled Therapeutic Interventions/Progress Updates:    Pt completed bathing and dressing during session.  He was able to transfer from supine to sit with max assist to work on these tasks EOB.  He maintained sitting balance EOB with min assist and increased left lean.  Head turn to the right as well was noted.  Therapist had him scan left of midline to locate the washcloth with the wash pan placed to the right on the bedside table to promote weightshift over the pushing side.  He needed overall max assist for dynamic sitting balance secondary to pushing to the left while working on bathing tasks.  Max assist for sit to stand with total +2 (pt 50%) for washing peri area and buttocks as well as for pulling items over hips.  Max assist was needed for threading garments over his feet with max assist for orientation of clothing as well.  He needed mod assist for donning pullover as well.  Max assist was needed for transfer over to the wheelchair squat pivot to the right where he sat to complete grooming task of brushing his teeth at the sink.  He needed setup of toothbrush but then he was able to complete the task.  Incorporated more scanning to the left to locate his cup  of water for rinsing.  He was left up in the wheelchair with safety belt in place and call button and phone in reach.  Placed TV on to help emphasize head turn to midline and left as he maintains head and gaze to the right.    Therapy Documentation Precautions:  Precautions Precautions: Fall Precaution Comments: left neglect, left hemiparesis, slight pusher to the left Restrictions Weight Bearing Restrictions: No  Pain: Pain Assessment Pain Scale: Faces Pain Score: 0-No pain ADL: See Care Plan for some details of mobility and selfcare  Therapy/Group: Individual Therapy  Javon Snee OTR/L 12/03/2019, 12:23 PM

## 2019-12-03 NOTE — Progress Notes (Signed)
[facility] PHYSICAL MEDICINE & REHABILITATION PROGRESS NOTE   Subjective/Complaints: Pt states he slept ok, no pain c/os.  LBM 10/8   ROS-    Pt denies CP, SOB, N/V/D   Objective:   No results found. Recent Labs    12/03/19 0556  WBC 8.0  HGB 11.2*  HCT 33.7*  PLT 159   Recent Labs    12/03/19 0556  NA 139  K 4.1  CL 104  CO2 26  GLUCOSE 106*  BUN 21  CREATININE 1.15  CALCIUM 8.8*    Intake/Output Summary (Last 24 hours) at 12/03/2019 1038 Last data filed at 12/03/2019 0730 Gross per 24 hour  Intake 962 ml  Output --  Net 962 ml        Physical Exam: Vital Signs Blood pressure (!) 117/54, pulse (!) 55, temperature 98.4 F (36.9 C), temperature source Oral, resp. rate 16, height 5\' 11"  (1.803 m), weight 110.8 kg, SpO2 99 %.  General: No acute distress Mood and affect are appropriate Heart: Regular rate and rhythm no rubs murmurs or extra sounds Lungs: Clear to auscultation, breathing unlabored, no rales or wheezes Abdomen: Positive bowel sounds, soft nontender to palpation, nondistended Extremities: No clubbing, cyanosis, or edema Skin: No evidence of breakdown, no evidence of rash  Neurologic: Cranial nerves II through XII intact, motor strength is 5/5 in RIght ,deltoid, bicep, tricep, grip, hip flexor, knee extensors, ankle dorsiflexor and plantar flexor LUE 2- elbow flex/ext 0/5 hand, LLE 2-left hip/knee ext synergy o/w 0  Sensory exam normal sensation to light touch  bilateral upper and lower extremities  Musculoskeletal: Full range of motion in all 4 extremities. No joint swelling    Assessment/Plan: 1. Functional deficits secondary to Right ICA distribution infarcts  which require 3+ hours per day of interdisciplinary therapy in a comprehensive inpatient rehab setting.  Physiatrist is providing close team supervision and 24 hour management of active medical problems listed below.  Physiatrist and rehab team continue to assess barriers to  discharge/monitor patient progress toward functional and medical goals  Care Tool:  Bathing    Body parts bathed by patient: Right arm, Chest, Abdomen   Body parts bathed by helper: Front perineal area, Buttocks, Right lower leg, Left lower leg, Face, Left arm     Bathing assist Assist Level: Maximal Assistance - Patient 24 - 49%     Upper Body Dressing/Undressing Upper body dressing   What is the patient wearing?: Pull over shirt    Upper body assist Assist Level: Maximal Assistance - Patient 25 - 49%    Lower Body Dressing/Undressing Lower body dressing      What is the patient wearing?: Incontinence brief, Pants     Lower body assist Assist for lower body dressing: Total Assistance - Patient < 25%     Toileting Toileting    Toileting assist Assist for toileting: 2 Helpers     Transfers Chair/bed transfer  Transfers assist     Chair/bed transfer assist level: 2 Helpers     Locomotion Ambulation   Ambulation assist   Ambulation activity did not occur: Safety/medical concerns          Walk 10 feet activity   Assist           Walk 50 feet activity   Assist           Walk 150 feet activity   Assist           Walk 10 feet on uneven surface  activity   Assist Walk 10 feet on uneven surfaces activity did not occur: Safety/medical concerns         Wheelchair     Assist Will patient use wheelchair at discharge?: Yes Type of Wheelchair: Manual Wheelchair activity did not occur: Safety/medical concerns  Wheelchair assist level: Total Assistance - Patient < 25%      Wheelchair 50 feet with 2 turns activity    Assist        Assist Level: Total Assistance - Patient < 25%   Wheelchair 150 feet activity     Assist      Assist Level: Total Assistance - Patient < 25%   Blood pressure (!) 117/54, pulse (!) 55, temperature 98.4 F (36.9 C), temperature source Oral, resp. rate 16, height 5\' 11"  (1.803 m),  weight 110.8 kg, SpO2 99 %.      Medical Problem List and Plan: 1.  Left-sided weakness and dysarthria secondary to scattered right MCA and ACA infarcts in the setting of right ICA bulb and siphon stenosis and right MCA narrowing due to large right meningioma.  Status post right ICA revascularization with stent assisted angioplasty 11/21/2019             -patient may shower but incision must be covered.             -ELOS/Goals: 2-3 weeks 2.  Antithrombotics: -DVT/anticoagulation: Lovenox             -antiplatelet therapy: Aspirin 81 mg daily and Brilinta 90 mg twice daily 3. Pain Management: Well controlled 4. Mood: Provide emotional support             -antipsychotic agents: N/A 5. Neuropsych: This patient is capable of making decisions on his own behalf. 6. Skin/Wound Care: Routine skin checks 7. Fluids/Electrolytes/Nutrition: Routine in and outs with follow-up chemistries 8.  Right medial sphenoid wing meningioma.  Followed by neurosurgery.  Decadron protocol.  No current indications for surgical intervention at this time 9.  Hypertension.  Norvasc 10 mg daily, Zaroxolyn 2.5 mg 1/day on Monday, Lopressor 12.5 mg twice daily, Imdur 120 mg daily. BP is well controlled to soft  Vitals:   12/02/19 1932 12/03/19 0439  BP: (!) 105/56 (!) 117/54  Pulse: (!) 48 (!) 55  Resp: 19 16  Temp: 98.2 F (36.8 C) 98.4 F (36.9 C)  SpO2: 99% 99%  controlled 10/11  10.  Diastolic congestive heart failure.  metoprolol, Lasix 40 mg twice daily.  Monitor for any signs of fluid overload, output NR since reduced lasix to 20mg  BID No peripheral edema , no clinical signs of pulmonary edema    11.  Hyperlipidemia.  Lipitor 12.  History of COPD/ tobacco abuse.  Counseling.  Albuterol inhaler as needed 13.  GERD.  Protonix 14.  Dysphagia.  Dysphagia #2 thin liquids.  Follow-up speech therapy 15. Bradycardic:on BB monitor TID., D/C BB  , checked 10/8 EKG sinus brady rate 49    LOS: 5 days A FACE TO  FACE EVALUATION WAS PERFORMED  Charlett Blake 12/03/2019, 10:38 AM

## 2019-12-03 NOTE — Progress Notes (Signed)
Speech Language Pathology Daily Session Note  Patient Details  Name: JRUE JARRIEL MRN: [id number] Date of Birth: August 02, 1945  Today's Date: 12/03/2019 SLP Individual Time: 1300-1356 SLP Individual Time Calculation (min): 56 min  Short Term Goals: Week 1: SLP Short Term Goal 1 (Week 1): Pt will consume trials of regular textures X2, demonstrating efficient mastication and oral clearance with no more than Min A verbal cues for use of swallow strategies prior to advancement. SLP Short Term Goal 2 (Week 1): Pt will demonstrate ability to problem solve basic to mildly complex tasks with Mod A verbal/visual cues. SLP Short Term Goal 3 (Week 1): Pt will detect functional errors with Mod A verbal/visual cues. SLP Short Term Goal 4 (Week 1): Pt will scan left visual field or attend to objects in left side of environment in 8/10 opportunities with Max A multimodal cues. SLP Short Term Goal 5 (Week 1): Pt will recall 2 safety precautions with Mod A verbal/visual cues. SLP Short Term Goal 6 (Week 1): Pt will selectively attend to functional tasks with Min A verbal and visual cues.  Skilled Therapeutic Interventions: Pt was seen for skilled ST targeting dysphagia and cognitive goals. SLP facilitated session with upgraded trial of regular textured snacks (oreos and graham cracker with peanut butter). Mildly prolonged mastication noted, however no pocketing or lingual residue observed. Trace anterior loss of thin liquids also noted - verbal cue provided for straw placement on right (strong side) of mouth. Otherwise pt used swallow precautions with only Mod I. Min A verbal cues were required for him to locate POs at midline and left side of tray, which is primary reason pt continues to require full supervision with meals. Will continue trials of regular textures in ST to work toward advancement.  SLP further facilitated session with a basic money management task using cash and coins. Pt required a  significant amount of extra time and Mod A verbal repetition cues for task initiation. Mod A verbal and visual cues also required for functional problem solving (Min A for strictly verbal), and error awareness. Pt also required Mod A primarily verbal cues for visual scanning to locate coins/cash at midline and left side of tray within money management task. He required Min A verbal cues to selectively attend to task in a mildly distracting environment (TV on as background noise/distraction). Pt left sitting in chair with alarm set and needs within reach. Continue per current plan of care.          Pain Pain Assessment Pain Scale: 0-10 Pain Score: 0-No pain  Therapy/Group: Individual Therapy  Arbutus Leas 12/03/2019, 3:07 PM

## 2019-12-04 ENCOUNTER — Inpatient Hospital Stay (HOSPITAL_COMMUNITY): Payer: No Typology Code available for payment source | Admitting: Occupational Therapy

## 2019-12-04 ENCOUNTER — Inpatient Hospital Stay (HOSPITAL_COMMUNITY): Payer: No Typology Code available for payment source | Admitting: Speech Pathology

## 2019-12-04 ENCOUNTER — Inpatient Hospital Stay (HOSPITAL_COMMUNITY): Payer: No Typology Code available for payment source

## 2019-12-04 DIAGNOSIS — I69391 Dysphagia following cerebral infarction: Secondary | ICD-10-CM

## 2019-12-04 DIAGNOSIS — I69354 Hemiplegia and hemiparesis following cerebral infarction affecting left non-dominant side: Principal | ICD-10-CM

## 2019-12-04 DIAGNOSIS — I10 Essential (primary) hypertension: Secondary | ICD-10-CM

## 2019-12-04 DIAGNOSIS — I5032 Chronic diastolic (congestive) heart failure: Secondary | ICD-10-CM

## 2019-12-04 NOTE — Plan of Care (Signed)
  Problem: Consults Goal: RH STROKE PATIENT EDUCATION Description: See Patient Education module for education specifics  Outcome: Progressing   Problem: RH BOWEL ELIMINATION Goal: RH STG MANAGE BOWEL WITH ASSISTANCE Description: STG Manage Bowel with mod Assistance. Outcome: Progressing Goal: RH STG MANAGE BOWEL W/MEDICATION W/ASSISTANCE Description: STG Manage Bowel with Medication with min Assistance. Outcome: Progressing   Problem: RH BLADDER ELIMINATION Goal: RH STG MANAGE BLADDER WITH ASSISTANCE Description: STG Manage Bladder With min Assistance Outcome: Progressing   Problem: RH SKIN INTEGRITY Goal: RH STG SKIN FREE OF INFECTION/BREAKDOWN Description: Skin will be free of infection/breakdown with min assist Outcome: Progressing Goal: RH STG MAINTAIN SKIN INTEGRITY WITH ASSISTANCE Description: STG Maintain Skin Integrity With min Assistance. Outcome: Progressing Goal: RH STG ABLE TO PERFORM INCISION/WOUND CARE W/ASSISTANCE Description: STG Able To Perform Incision/Wound Care With min Assistance. Outcome: Progressing   Problem: RH SAFETY Goal: RH STG ADHERE TO SAFETY PRECAUTIONS W/ASSISTANCE/DEVICE Description: STG Adhere to Safety Precautions With min Assistance/Device. Outcome: Progressing   Problem: RH KNOWLEDGE DEFICIT Goal: RH STG INCREASE KNOWLEDGE OF HYPERTENSION Description: Pt will be able to verbalize expected blood pressure and what medications are used for blood pressure with min assist Outcome: Progressing Goal: RH STG INCREASE KNOWLEDGE OF DYSPHAGIA/FLUID INTAKE Description: Pt will increase fluid intake and verbalize the need for dysphagia meals with min assit Outcome: Progressing Goal: RH STG INCREASE KNOWLEDGE OF STROKE PROPHYLAXIS Description: Pt will be able to verbalize medication for stroke prophylaxis with min assist Outcome: Progressing   Problem: RH Vision Goal: RH LTG Vision (Specify) Outcome: Progressing

## 2019-12-04 NOTE — Progress Notes (Signed)
Orthopedic Tech Progress Note Patient Details:  [name] Jan 05, 1946 [id number] Called in brace Patient ID: Charles Sherman, male   DOB: 01/12/1946, 74 y.o.   MRN: [id number]   Ellouise Newer 12/04/2019, 12:42 PM

## 2019-12-04 NOTE — Progress Notes (Signed)
Occupational Therapy Session Note  Patient Details  Name: Charles Sherman MRN: [id number] Date of Birth: [birth year]  Today's Date: 12/04/2019 OT Individual Time: 4680-3212 OT Individual Time Calculation (min): 59 min    Short Term Goals: Week 1:  OT Short Term Goal 1 (Week 1): Pt will maintain static sitting balance EOB or EOM with min guard assist for 3 mins in preparation for selfcare tasks. OT Short Term Goal 2 (Week 1): Pt will complete UB bathing with min assist in supported sitting for 2 consecutive sessions. OT Short Term Goal 3 (Week 1): Pt will complete LB dressing sit to stand with max assist of 1 for 2 consecutive sessions. OT Short Term Goal 4 (Week 1): Pt will complete UB dressing with mod assist sitting unsupported.  Skilled Therapeutic Interventions/Progress Updates:    Pt completed bathing and dressing during session.  He was able to transfer from supine to sit with max assist to work on these tasks EOB.  He maintained sitting balance EOB with min assist and increased left lean when washing.  He demonstrates increased head turn to the right as well was noted.  Therapist had him scan left of midline to locate the washcloth, towels, and deodorant with mod instructional cueing.  The wash pan was placed to the right on the bedside table to promote weightshift over the pushing side.  Increased difficulty noted reaching toward the pushing side.  He needed overall mod assist with max demonstrational cueing for dynamic sitting balance secondary to pushing to the left.  Max assist was needed for sit to stand with total +2 (pt 50%) for washing peri area and buttocks as well as for pulling items over hips.  Max assist was needed for threading garments over his feet with max assist for orientation of clothing as well.  therapist provided total assist for donning TEDs and gripper socks.  He needed mod assist for donning pullover scrub top as well.  He completed stand pivot transfer to the  wheelchair with max assist to the right.  He was left up in the wheelchair with safety belt in place and call button and phone in reach.   Therapy Documentation Precautions:  Precautions Precautions: Fall Precaution Comments: left neglect, left hemiparesis, slight pusher to the left Restrictions Weight Bearing Restrictions: No  Pain: Pain Assessment Pain Scale: 0-10 Pain Score: 0-No pain ADL: See Care Tool Section for some details of mobility and selfcare  Therapy/Group: Individual Therapy  Timika Muench OTR/L 12/04/2019, 12:13 PM

## 2019-12-04 NOTE — Progress Notes (Signed)
[facility] PHYSICAL MEDICINE & REHABILITATION PROGRESS NOTE   Subjective/Complaints: Patient seen sitting up at the edge of his bed working with therapies.  He states he slept well overnight.  He keeps his right eye closed and states he cannot open it, however opens at times.  Discussed pushing with therapies as well as bracing.  ROS: Denies CP, SOB, N/V/D  Objective:   No results found. Recent Labs    12/03/19 0556  WBC 8.0  HGB 11.2*  HCT 33.7*  PLT 159   Recent Labs    12/03/19 0556  NA 139  K 4.1  CL 104  CO2 26  GLUCOSE 106*  BUN 21  CREATININE 1.15  CALCIUM 8.8*    Intake/Output Summary (Last 24 hours) at 12/04/2019 1222 Last data filed at 12/04/2019 0000 Gross per 24 hour  Intake 100 ml  Output 1 ml  Net 99 ml        Physical Exam: Vital Signs Blood pressure (!) 146/79, pulse 67, temperature 98.3 F (36.8 C), resp. rate 15, height 5\' 11"  (1.803 m), weight 110.8 kg, SpO2 96 %. Constitutional: No distress . Vital signs reviewed. HENT: Normocephalic.  Atraumatic. Eyes: Tends to keep right eye closed. No discharge. Cardiovascular: No JVD.  RRR. Respiratory: Normal effort.  No stridor.  Bilateral clear to auscultation. GI: Non-distended.  BS +. Skin: Warm and dry.  Intact. Psych: Normal mood.  Normal behavior. Musc: No edema in extremities.  No tenderness in extremities. Neuro: Alert Motor:  LUE: 0/5 proximal distal LLE: Hip flexion 0/5, knee extension 2-/5, ankle dorsiflexion 0/5  Assessment/Plan: 1. Functional deficits secondary to Right ICA distribution infarcts  which require 3+ hours per day of interdisciplinary therapy in a comprehensive inpatient rehab setting.  Physiatrist is providing close team supervision and 24 hour management of active medical problems listed below.  Physiatrist and rehab team continue to assess barriers to discharge/monitor patient progress toward functional and medical goals  Care Tool:  Bathing    Body parts  bathed by patient: Chest, Abdomen, Right arm, Right upper leg, Left upper leg, Right lower leg, Face   Body parts bathed by helper: Front perineal area, Buttocks, Left arm, Left lower leg     Bathing assist Assist Level: 2 Helpers     Upper Body Dressing/Undressing Upper body dressing   What is the patient wearing?: Pull over shirt    Upper body assist Assist Level: Minimal Assistance - Patient > 75%    Lower Body Dressing/Undressing Lower body dressing      What is the patient wearing?: Incontinence brief, Pants     Lower body assist Assist for lower body dressing: 2 Helpers     Toileting Toileting    Toileting assist Assist for toileting: Maximal Assistance - Patient 25 - 49%     Transfers Chair/bed transfer  Transfers assist     Chair/bed transfer assist level: Maximal Assistance - Patient 25 - 49%     Locomotion Ambulation   Ambulation assist   Ambulation activity did not occur: Safety/medical concerns          Walk 10 feet activity   Assist           Walk 50 feet activity   Assist           Walk 150 feet activity   Assist           Walk 10 feet on uneven surface  activity   Assist Walk 10 feet on uneven surfaces  activity did not occur: Safety/medical concerns         Wheelchair     Assist Will patient use wheelchair at discharge?: Yes Type of Wheelchair: Manual Wheelchair activity did not occur: Safety/medical concerns  Wheelchair assist level: Total Assistance - Patient < 25%      Wheelchair 50 feet with 2 turns activity    Assist        Assist Level: Total Assistance - Patient < 25%   Wheelchair 150 feet activity     Assist      Assist Level: Total Assistance - Patient < 25%   Blood pressure (!) 146/79, pulse 67, temperature 98.3 F (36.8 C), resp. rate 15, height 5\' 11"  (1.803 m), weight 110.8 kg, SpO2 96 %.   Medical Problem List and Plan: 1.  Left-sided hemiparesis and dysarthria  secondary to scattered right MCA and ACA infarcts in the setting of right ICA bulb and siphon stenosis and right MCA narrowing due to large right meningioma.  Status post right ICA revascularization with stent assisted angioplasty 11/21/2019  Continue CIR  WHO/PRAFO ordered 2.  Antithrombotics: -DVT/anticoagulation: Lovenox             -antiplatelet therapy: Aspirin 81 mg daily and Brilinta 90 mg twice daily 3. Pain Management: Well controlled 4. Mood: Provide emotional support             -antipsychotic agents: N/A 5. Neuropsych: This patient is capable of making decisions on his own behalf. 6. Skin/Wound Care: Routine skin checks 7. Fluids/Electrolytes/Nutrition: Routine in and outs 8.  Right medial sphenoid wing meningioma.  Followed by neurosurgery.  Decadron protocol.  No current indications for surgical intervention at this time 9.  Hypertension.  Norvasc 10 mg daily, Zaroxolyn 2.5 mg 1/day on Monday, Lopressor 12.5 mg twice daily, Imdur 120 mg daily. BP is well controlled to soft  Vitals:   12/03/19 1923 12/04/19 0521  BP: (!) 101/52 (!) 146/79  Pulse: (!) 50 67  Resp: 16 15  Temp: 98.3 F (36.8 C)   SpO2: 97% 96%   Labile on 10/12, monitor for trend-avoid hypotension 10.  Diastolic congestive heart failure.  metoprolol, Lasix 40 mg twice daily.  Monitor for any signs of fluid overload  Lasix decreased to 20mg  BID  Daily weights ordered Filed Weights   11/28/19 1707  Weight: 110.8 kg  11.  Hyperlipidemia.  Lipitor 12.  History of COPD/ tobacco abuse.  Counseling.  Albuterol inhaler as needed 13.  GERD.  Protonix 14.  Post stroke dysphagia.  Advanced to Dysphagia #3 thin liquids.  Follow-up speech therapy 15. Bradycardic:on BB monitor TID., D/C BB  , checked 10/8 EKG sinus brady rate 49 16.  Morbid obesity: Encourage weight loss   LOS: 6 days A FACE TO FACE EVALUATION WAS PERFORMED  Kaan Tosh Lorie Phenix 12/04/2019, 12:22 PM

## 2019-12-04 NOTE — Progress Notes (Signed)
Speech Language Pathology Daily Session Note  Patient Details  Name: Charles Sherman MRN: [id number] Date of Birth: 1945-12-27  Today's Date: 12/04/2019 SLP Individual Time: 1000-1058 SLP Individual Time Calculation (min): 58 min  Short Term Goals: Week 1: SLP Short Term Goal 1 (Week 1): Pt will consume trials of regular textures X2, demonstrating efficient mastication and oral clearance with no more than Min A verbal cues for use of swallow strategies prior to advancement. SLP Short Term Goal 2 (Week 1): Pt will demonstrate ability to problem solve basic to mildly complex tasks with Mod A verbal/visual cues. SLP Short Term Goal 3 (Week 1): Pt will detect functional errors with Mod A verbal/visual cues. SLP Short Term Goal 4 (Week 1): Pt will scan left visual field or attend to objects in left side of environment in 8/10 opportunities with Max A multimodal cues. SLP Short Term Goal 5 (Week 1): Pt will recall 2 safety precautions with Mod A verbal/visual cues. SLP Short Term Goal 6 (Week 1): Pt will selectively attend to functional tasks with Min A verbal and visual cues.  Skilled Therapeutic Interventions: Pt was seen for skilled ST targeting cognitive goals. Upon arrival pt had not yet eaten his breakfast, therefore SLP provided set up assist and Min A verbal cues for sustained attention to intake, initiation of self feeding, and locating items on left side of tray. He was Mod I for use of swallow strategies; need for full supervision remains primarily due to reduced attention and left visual inattention. SLP further facilitated session with reading and basic medication management tasks. He continues to require Mod-Max A verbal and visual cueing for visual scanning during basic phrase and sentence level tasks. Mod faded to Min A verbal and visual cues were required for functional problem solving when interpreting the medication labels to organize a basic QID pill chart. During semi-complex  daily functional math problems, pt required Mod A verbal cues for verbal problem solving. He continues to require extra time for completion of tasks and Min A verbal cues for redirection to selectively attend to tasks. Pt left sitting in chair with alarm set and needs within reach. Continue per current plan of care.          Pain Pain Assessment Pain Scale: 0-10 Pain Score: 0-No pain  Therapy/Group: Individual Therapy  Arbutus Leas 12/04/2019, 12:03 PM

## 2019-12-04 NOTE — Progress Notes (Signed)
Physical Therapy Session Note  Patient Details  Name: Charles Sherman MRN: [id number] Date of Birth: Oct 30, 1945  Today's Date: 12/04/2019 PT Individual Time: 1345-1500 PT Individual Time Calculation (min): 75 min   Short Term Goals: Week 1:  PT Short Term Goal 1 (Week 1): Pt will transfer sup to sit w/ max A of 1. PT Short Term Goal 2 (Week 1): Pt will sit EOB w/ mod A x 3' PT Short Term Goal 3 (Week 1): Pt will transfer sit to stand w/ Stedy and max A w/ improved midline standing position. Week 2:    Week 3:     Skilled Therapeutic Interventions/Progress Updates:    PAIN denies pain  Pt initially sleeping, soiled brief and bed linens soaked in urine.  Pt easily awakened.  Rolls mult times to L and R w/hand over hand guidance to locate rail on L, min assist to L, mod to R for removal of brief and placement of dry chuck.  Pt provided w/washcloth and towel and able to clean perineal area w/cues to attend to L side, otherwise only cleans R.  Repeated rolling as above for placement of clean brief.  Supine to side to sit w/mod assist, pt reaches for foot of bed w/RUE and initially sits w/min assist holding rail to far R.  Therapist applied clean gown/no other clothing available.   stedy set up by therapist, mod assist for foot placement on L. STS in stedy w/mod assist for upright posture due to very strong pushing w/RUE/heavy L lean and overall flexed posture.  Pt transported to mirror to work on midline, able to partially correct but maintains only briefly before returning to heabvy pushing.  Transported to wc via stedy perched in semistand w/therapist to L/max assist for safety.  Transfers to wc w/mod assist.  Pt then transported to gym for continued session w/emphasis on midline orientation in semistand in stedy and in standing in stedy.  In sitting pt engaged in game of war w/deck of cards placed to far R requiring max trunk excursions to R in effort to deter pushing w/RUE and promote  midline.  Pt able to participate and minimal pushing when engaged in activity to R using RUE, but immediately returns to pushing when not engaged and tends toward forward flexion w/fatigue.  Repeated war w/brief periods in standing, then worked on reaching for and placing beanbags to R reaching overhead toward mirror to promote extension, then returning into basket to R in standing and sitting, continues w/intermittent ability to maintaing wt shift R and return to pushing w/R strongly to L and increased flexion w/fatigue.    At end of session, pt returned to room via wc transport.  STS and transfer to bed via stedy w/max cues and max blocking on L due to pushing.  Pt instructed to place hand on lower lateral sidebar of stedy on R to decrease ability to push which did decrease work required and improve safety in stedy.   Sit to supine w/max assist of 2 and cues for sequencing. Pt left supine w/rails up x 4, alarm set, bed in lowest position, and needs in reach.  Therapy Documentation Precautions:  Precautions Precautions: Fall Precaution Comments: left neglect, left hemiparesis, slight pusher to the left Restrictions Weight Bearing Restrictions: No    Therapy/Group: Individual Therapy  Callie Fielding, PT   Jerrilyn Cairo 12/04/2019, 3:51 PM

## 2019-12-05 ENCOUNTER — Inpatient Hospital Stay (HOSPITAL_COMMUNITY): Payer: No Typology Code available for payment source | Admitting: Physical Therapy

## 2019-12-05 ENCOUNTER — Inpatient Hospital Stay (HOSPITAL_COMMUNITY): Payer: No Typology Code available for payment source | Admitting: Speech Pathology

## 2019-12-05 ENCOUNTER — Inpatient Hospital Stay (HOSPITAL_COMMUNITY): Payer: No Typology Code available for payment source | Admitting: Occupational Therapy

## 2019-12-05 NOTE — Progress Notes (Signed)
Speech Language Pathology Daily Session Note  Patient Details  Name: Charles Sherman MRN: [id number] Date of Birth: 1945-09-11  Today's Date: 12/05/2019 SLP Individual Time: 1000-1100 SLP Individual Time Calculation (min): 60 min  Short Term Goals: Week 1: SLP Short Term Goal 1 (Week 1): Pt will consume trials of regular textures X2, demonstrating efficient mastication and oral clearance with no more than Min A verbal cues for use of swallow strategies prior to advancement. SLP Short Term Goal 2 (Week 1): Pt will demonstrate ability to problem solve basic to mildly complex tasks with Mod A verbal/visual cues. SLP Short Term Goal 3 (Week 1): Pt will detect functional errors with Mod A verbal/visual cues. SLP Short Term Goal 4 (Week 1): Pt will scan left visual field or attend to objects in left side of environment in 8/10 opportunities with Max A multimodal cues. SLP Short Term Goal 5 (Week 1): Pt will recall 2 safety precautions with Mod A verbal/visual cues. SLP Short Term Goal 6 (Week 1): Pt will selectively attend to functional tasks with Min A verbal and visual cues.  Skilled Therapeutic Interventions:   Patient seen for skilled ST session focusing on cognitive-linguistic function. Patient required mod-maximal A for visual attention and scanning in left visual field for reading and describing photos. SLP provided visual cues of purple border on left side as well as bright yellow line on left with verbal cues to direct patient's attention. He is unable to scan left to right and then back to left for next line of text at large print. He was able to scan down in left visual field to read list of medications and identify medication in list when SLP requested, "what medication is #4?Marland Kitchen He was able to list most of his current medications  Patient continues to benefit from skilled SLP intervention to maximize cognitive-linguistic functioning prior to discharge.   Pain Pain Assessment Pain  Scale: Faces Pain Score: 0-No pain  Therapy/Group: Individual Therapy  Sonia Baller, MA, CCC-SLP Speech Therapy

## 2019-12-05 NOTE — Progress Notes (Signed)
Occupational Therapy Session Note  Patient Details  Name: Charles Sherman MRN: [id number] Date of Birth: 01/04/1946  Today's Date: 12/05/2019 OT Individual Time: 0800-0902 OT Individual Time Calculation (min): 62 min    Short Term Goals: Week 1:  OT Short Term Goal 1 (Week 1): Pt will maintain static sitting balance EOB or EOM with min guard assist for 3 mins in preparation for selfcare tasks. OT Short Term Goal 2 (Week 1): Pt will complete UB bathing with min assist in supported sitting for 2 consecutive sessions. OT Short Term Goal 3 (Week 1): Pt will complete LB dressing sit to stand with max assist of 1 for 2 consecutive sessions. OT Short Term Goal 4 (Week 1): Pt will complete UB dressing with mod assist sitting unsupported.  Skilled Therapeutic Interventions/Progress Updates:    Pt agreeable to working on bathing and dressing EOB.  He needed min assist for rolling to the left side of the bed with mod assist to transition to sitting from sidelying.  He was able to maintain static sitting balance with increased midline perception to start and with only min guard assist.  Had him work scanning left of midline to locate washcloth in therapist hand and then weightshift to the right to place it into the wash pan.  Increased emphasis on reaching to the right with promoted weightshift over the pushing side.  He needed overall max questioning cueing to sequence bathing tasks as well as for initiation as at some times, he would stop and just sit.  Mod assist initially progressing to max assist for dynamic sitting balance while completing bathing and dressing tasks.  Increased LOB posteriorly as well as to the left Max hand over hand assist needed for integration of the LUE for holding the soap or other items to be opened as well as for washing the RUE.  He was able to complete sit to stand with max assist while washing peri area and for pulling brief and pants over hips.  Max assist for threading  items over his feet to pull up as well.  Increased trunk flexion in standing with motor impersistence noted as pt continues to flex further the longer he stands secondary to not being able to maintain motor activity in his trunk and LLE.  Therapist assisted with TEDs and socks EOB secondary to decreased time with max assist needed to maintain sitting balance while attempting to lift his LEs.  He was able to complete stand pivot transfer to the wheelchair to the right with max assist.  Increased trunk flexion noted with increased pushing to the left in standing.  Finished session with pt in the wheelchair tilted back at approximately 65 degrees for safety.  Pillow placed under the LUE for support.  Safety belt in place with call button and phone in reach on the right side.  Nursing in to assist with meal as well.    Therapy Documentation Precautions:  Precautions Precautions: Fall Precaution Comments: left neglect, left hemiparesis, slight pusher to the left Restrictions Weight Bearing Restrictions: No  Pain: Pain Assessment Pain Scale: Faces Pain Score: 0-No pain ADL: See Care Tool Section for some details of mobility and selfcare  Therapy/Group: Individual Therapy  Charles Sherman OTR/L 12/05/2019, 10:40 AM

## 2019-12-05 NOTE — Progress Notes (Signed)
Physical Therapy Session Note  Patient Details  Name: Charles Sherman MRN: [id number] Date of Birth: 07-22-1945  Today's Date: 12/05/2019 PT Individual Time: 1515-1630 PT Individual Time Calculation (min): 75 min   Short Term Goals: Week 1:  PT Short Term Goal 1 (Week 1): Pt will transfer sup to sit w/ max A of 1. PT Short Term Goal 2 (Week 1): Pt will sit EOB w/ mod A x 3' PT Short Term Goal 3 (Week 1): Pt will transfer sit to stand w/ Stedy and max A w/ improved midline standing position.  Skilled Therapeutic Interventions/Progress Updates:    Pt received supine in bed, agreeable to PT session. No complaints of pain. Supine to sit with max A for LLE management and trunk control. Sit to stand with assist x 2 to stedy. Stedy transfer to w/c with assist for trunk control due to significant L lateral lean. Dependent transport via TIS to/from therapy gym. Stedy transfer to mat table. Session focus on seated balance EOM with use of mirror for visual feedback for midline. While seated pt requires assist to prevent L lateral and posterior lean. Reaching with RUE outside BOS to the R to encourage weight shift onto R side to prevent pushing to the L. Pt requires frequent cues due to pushing. Sit to stand x 5 reps to stedy with assist x 2 from mat table, assist x 1 from perched stedy seat. Pt exhibits increased lateral lean to the L in standing, able to correct with max cueing but unable to maintain position. Pt then reports urge to urinate. Obtained urinal and attempt to have pt urinate while standing in stedy but pt had already been incontinent of urine in brief, unable to void further. Stedy transfer back to w/c then back to bed. Sit to supine max A for trunk control and LLE management. Rolling L/R with max A for dependent brief change and pericare. Remainder of session focus on LLE strengthening at bed level. Supine LLE strengthening therex with AAROM: hip flex, hip abd, SKFO, quad set with minimal  quad activation detected. Pt left seated in bed with needs in reach, bed alarm in place at end of session.  Therapy Documentation Precautions:  Precautions Precautions: Fall Precaution Comments: left neglect, left hemiparesis, slight pusher to the left Restrictions Weight Bearing Restrictions: No    Therapy/Group: Individual Therapy   Excell Seltzer, PT, DPT  12/05/2019, 5:17 PM

## 2019-12-05 NOTE — Progress Notes (Signed)
Patient ID: Charles Sherman, male   DOB: 23-Jun-1945, 74 y.o.   MRN: [id number]   SW met with patient spouse in room to inform her of the one visitor per day rule. Provided contact information to spouse.   [place], [place]

## 2019-12-05 NOTE — Progress Notes (Signed)
Patient ID: Charles Sherman, male   DOB: [birth year], 74 y.o.   MRN: [id number] Team Conference Report to Patient/Family  Team Conference discussion was reviewed with the patient and caregiver, including goals, any changes in plan of care and target discharge date.  Patient and caregiver express understanding and are in agreement.  The patient has a target discharge date of 12/25/19.  Dyanne Iha 12/05/2019, 1:35 PM

## 2019-12-05 NOTE — Plan of Care (Signed)
  Problem: Consults Goal: RH STROKE PATIENT EDUCATION Description: See Patient Education module for education specifics  Outcome: Progressing   Problem: RH BOWEL ELIMINATION Goal: RH STG MANAGE BOWEL WITH ASSISTANCE Description: STG Manage Bowel with mod Assistance. Outcome: Progressing Goal: RH STG MANAGE BOWEL W/MEDICATION W/ASSISTANCE Description: STG Manage Bowel with Medication with min Assistance. Outcome: Progressing   Problem: RH BLADDER ELIMINATION Goal: RH STG MANAGE BLADDER WITH ASSISTANCE Description: STG Manage Bladder With min Assistance Outcome: Progressing   Problem: RH SKIN INTEGRITY Goal: RH STG SKIN FREE OF INFECTION/BREAKDOWN Description: Skin will be free of infection/breakdown with min assist Outcome: Progressing Goal: RH STG MAINTAIN SKIN INTEGRITY WITH ASSISTANCE Description: STG Maintain Skin Integrity With min Assistance. Outcome: Progressing Goal: RH STG ABLE TO PERFORM INCISION/WOUND CARE W/ASSISTANCE Description: STG Able To Perform Incision/Wound Care With min Assistance. Outcome: Progressing   Problem: RH SAFETY Goal: RH STG ADHERE TO SAFETY PRECAUTIONS W/ASSISTANCE/DEVICE Description: STG Adhere to Safety Precautions With min Assistance/Device. Outcome: Progressing   Problem: RH KNOWLEDGE DEFICIT Goal: RH STG INCREASE KNOWLEDGE OF HYPERTENSION Description: Pt will be able to verbalize expected blood pressure and what medications are used for blood pressure with min assist Outcome: Progressing Goal: RH STG INCREASE KNOWLEDGE OF DYSPHAGIA/FLUID INTAKE Description: Pt will increase fluid intake and verbalize the need for dysphagia meals with min assit Outcome: Progressing Goal: RH STG INCREASE KNOWLEDGE OF STROKE PROPHYLAXIS Description: Pt will be able to verbalize medication for stroke prophylaxis with min assist Outcome: Progressing   Problem: RH Vision Goal: RH LTG Vision (Specify) Outcome: Progressing

## 2019-12-05 NOTE — Progress Notes (Signed)
[facility] PHYSICAL MEDICINE & REHABILITATION PROGRESS NOTE   Subjective/Complaints:  Pt without new issues overnite  ROS: Denies CP, SOB, N/V/D  Objective:   No results found. Recent Labs    12/03/19 0556  WBC 8.0  HGB 11.2*  HCT 33.7*  PLT 159   Recent Labs    12/03/19 0556  NA 139  K 4.1  CL 104  CO2 26  GLUCOSE 106*  BUN 21  CREATININE 1.15  CALCIUM 8.8*    Intake/Output Summary (Last 24 hours) at 12/05/2019 0846 Last data filed at 12/04/2019 1809 Gross per 24 hour  Intake 640 ml  Output --  Net 640 ml        Physical Exam: Vital Signs Blood pressure (!) 102/51, pulse (!) 55, temperature 97.8 F (36.6 C), temperature source Oral, resp. rate 16, height _0  (1.803 m), weight 104.3 kg, SpO2 92 %.  General: No acute distress Mood and affect are appropriate Heart: Regular rate and rhythm no rubs murmurs or extra sounds Lungs: Clear to auscultation, breathing unlabored, no rales or wheezes Abdomen: Positive bowel sounds, soft nontender to palpation, nondistended Extremities: No clubbing, cyanosis, or edema Skin: No evidence of breakdown, no evidence of rash Neurologic: sensation absent LT LUE, reduce Left great toe sensation intact at little toe    LLE: Hip flexion 0/5, knee extension 2-/5, ankle dorsiflexion 0/5, Left plantar flexion 2-/5 Left UE 2- elbow flex/ext, trace finger flexion  Assessment/Plan: 1. Functional deficits secondary to Right ICA distribution infarcts  which require 3+ hours per day of interdisciplinary therapy in a comprehensive inpatient rehab setting.  Physiatrist is providing close team supervision and 24 hour management of active medical problems listed below.  Physiatrist and rehab team continue to assess barriers to discharge/monitor patient progress toward functional and medical goals  Care Tool:  Bathing    Body parts bathed by patient: Chest, Abdomen, Right arm, Right upper leg, Left upper leg, Right lower leg,  Face   Body parts bathed by helper: Front perineal area, Buttocks, Left arm, Left lower leg     Bathing assist Assist Level: 2 Helpers     Upper Body Dressing/Undressing Upper body dressing   What is the patient wearing?: Pull over shirt    Upper body assist Assist Level: Minimal Assistance - Patient > 75%    Lower Body Dressing/Undressing Lower body dressing      What is the patient wearing?: Incontinence brief, Pants     Lower body assist Assist for lower body dressing: 2 Helpers     Toileting Toileting    Toileting assist Assist for toileting: Maximal Assistance - Patient 25 - 49%     Transfers Chair/bed transfer  Transfers assist     Chair/bed transfer assist level: Maximal Assistance - Patient 25 - 49%     Locomotion Ambulation   Ambulation assist   Ambulation activity did not occur: Safety/medical concerns          Walk 10 feet activity   Assist           Walk 50 feet activity   Assist           Walk 150 feet activity   Assist           Walk 10 feet on uneven surface  activity   Assist Walk 10 feet on uneven surfaces activity did not occur: Safety/medical concerns         Wheelchair     Assist Will patient use wheelchair  at discharge?: Yes Type of Wheelchair: Manual Wheelchair activity did not occur: Safety/medical concerns  Wheelchair assist level: Total Assistance - Patient < 25%      Wheelchair 50 feet with 2 turns activity    Assist        Assist Level: Total Assistance - Patient < 25%   Wheelchair 150 feet activity     Assist      Assist Level: Total Assistance - Patient < 25%   Blood pressure (!) 102/51, pulse (!) 55, temperature 97.8 F (36.6 C), temperature source Oral, resp. rate 16, height _0  (1.803 m), weight 104.3 kg, SpO2 92 %.   Medical Problem List and Plan: 1.  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  Status post right ICA revascularization with stent assisted angioplasty 11/21/2019  Continue CIR Team conference today please see physician documentation under team conference tab, met with team  to discuss problems,progress, and goals. Formulized individual treatment plan based on medical history, underlying problem and comorbidities.   WHO/PRAFO ordered 2.  Antithrombotics: -DVT/anticoagulation: Lovenox             -antiplatelet therapy: Aspirin 81 mg daily and Brilinta 90 mg twice daily 3. Pain Management: Well controlled 4. Mood: Provide emotional support             -antipsychotic agents: N/A 5. Neuropsych: This patient is capable of making decisions on his own behalf. 6. Skin/Wound Care: Routine skin checks 7. Fluids/Electrolytes/Nutrition: Routine in and outs 8.  Right medial sphenoid wing meningioma.  Followed by neurosurgery.  Decadron protocol.  No current indications for surgical intervention at this time 9.  Hypertension.  Norvasc 10 mg daily, Zaroxolyn 2.5 mg 1/day on Monday, Lopressor 12.5 mg twice daily, Imdur 120 mg daily. BP is well controlled to soft  Vitals:   12/04/19 2113 12/05/19 0403  BP: 107/63 (!) 102/51  Pulse: 60 (!) 55  Resp: 16 16  Temp: 98.3 F (36.8 C) 97.8 F (36.6 C)  SpO2: 99% 92%   Labile on 10/12, monitor for trend-avoid hypotension 10.  Diastolic congestive heart failure.  metoprolol, Lasix 40 mg twice daily.  Monitor for any signs of fluid overload  Lasix decreased to 24m BID  Daily weights ordered Filed Weights   11/28/19 1707 12/05/19 0403  Weight: 110.8 kg 104.3 kg  11.  Hyperlipidemia.  Lipitor 12.  History of COPD/ tobacco abuse.  Counseling.  Albuterol inhaler as needed 13.  GERD.  Protonix 14.  Post stroke dysphagia.  Advanced to Dysphagia #3 thin liquids.  Follow-up speech therapy 15. Bradycardic:on BB monitor TID., D/C BB  , checked 10/8 EKG  sinus brady rate 49 16.  Morbid obesity: Encourage weight loss   LOS: 7 days A FACE TO FACE EVALUATION WAS PERFORMED  ACharlett Blake10/13/2021, 8:46 AM

## 2019-12-05 NOTE — Patient Care Conference (Signed)
Inpatient RehabilitationTeam Conference and Plan of Care Update Date: 12/05/2019   Time: 10:32 AM    Patient Name: Charles Sherman      Medical Record Number: [id number]  Date of Birth: 10-26-1945 Sex: Male         Room/Bed: 4W11C/4W11C-01 Payor Info: Payor: MEDICARE / Plan: MEDICARE PART A / Product Type: *No Product type* /    Admit Date/Time:  11/28/2019  4:03 PM  Primary Diagnosis:  Cerebral thrombosis with cerebral infarction [facility])  Hospital Problems: Principal Problem:   Cerebral thrombosis with cerebral infarction Active Problems:   Right middle cerebral artery stroke (HCC)   Hemiparesis affecting left side as late effect of stroke ([place])   Morbid obesity ([place])   Dysphagia, post-stroke   Chronic diastolic congestive heart failure [facility])   Essential hypertension    Expected Discharge Date: Expected Discharge Date: 12/25/19  Team Members Present: Physician leading conference: Dr. Alysia Penna Care Coodinator Present: Dorien Chihuahua, RN, BSN, CRRN;Christina Sampson Goon, [place] Nurse Present: Other (comment) Tommie Sams, RN) PT Present: Stacy Gardner, PT OT Present: Clyda Greener, OT SLP Present: Jettie Booze, CF-SLP PPS Coordinator present : Gunnar Fusi, Novella Olive, PT     Current Status/Progress Goal Weekly Team Focus  Bowel/Bladder   patient is incontinent of bowel and bladder. LBM 12/04/19  obtain continence of bowel and bladder  q 2-3 hours and PRN toileting to decrease incontinence   Swallow/Nutrition/ Hydration   Dys 3 textures, thin liquids, Supervision-Mod I use swallow precautions but continues to require full supervision due to cognition and left inattention  Mod I least restrictive diet  regular texture trials and potential for upgrade soon, carryover swallow strategies   ADL's   Min assist for UB bathing in supported sitting with mod assist for UB dressing.  Total +2 (pt 50%) for LB selfcare sit to stand as well as total assist for stand  pivot transfers with mod to max for squat pivot. No digit movement in the left hand but developing shoulder synergy pattern at [facility] stage III.  Right neglect with right field deficit and head turned to the right.  min to mod assist  neuromuscular re-education, balance retraining, transfer training, visual compensation, therapeutic exercise, pt/family education   Mobility   MaxA bed mobility, ModA x2 STS, ModA x2 SBT, TotalA wc mobility in TIS, up to MaxA for sitting balance d/t pushing  grossly MinA-ModA  static sitting balance, midline orientation, transfers, gait progressions   Communication             Safety/Cognition/ Behavioral Observations  Min-Mod  Supervision  initiation, selective attention, functional problem solving, error awareness, visual scanning   Pain   Patient denies pain  pain level  is <3 with or without activity  Q shift and PRN pain assessment   Skin   patient has no skin issues aside from having edema in upper and lower extremeties  prevent further skin breakdown  q shift and PRN skin assessment     Discharge Planning:  Goal is to discharge Min A. Primary caregiver undetermined currently (depends on patients level of care), Spouse has health concerns, pt sister is HPOA. Has grandson and gread grand children able to supervise. 1 level home, 2 steps to enter.   Team Discussion: Right ICA with trace of finger flex, LE weakness. BP soft and MD adjusting medications for DHF. Monitoring for dizziness with activity. Incontinent of B+B. Currenlty on D3 thin diet with full supervision. Would benefit from AD/Plate guard for ease  of self feeding due to left visual field deficit. Requires handover hand assist for activities with fluctation in cognition  and need for encouragement to scan to the left. Patient on target to meet rehab goals: yes  *See Care Plan and progress notes for long and short-term goals.   Revisions to Treatment Plan:   Teaching Needs:   Current  Barriers to Discharge: Incontinence  Possible Resolutions to Barriers: Family education     Medical Summary Current Status: BPs soft, HR- bradycardiasome improvement with HR off BB  Barriers to Discharge: Medical stability;Incontinence   Possible Resolutions to [employer]: utilize plateguard, med adjustments fr BP if pt develops orthostatic changes   Continued Need for Acute Rehabilitation Level of Care: The patient requires daily medical management by a physician with specialized training in physical medicine and rehabilitation for the following reasons: Direction of a multidisciplinary physical rehabilitation program to maximize functional independence : Yes Medical management of patient stability for increased activity during participation in an intensive rehabilitation regime.: Yes Analysis of laboratory values and/or radiology reports with any subsequent need for medication adjustment and/or medical intervention. : Yes   I attest that I was present, lead the team conference, and concur with the assessment and plan of the team.   Dorien Chihuahua B 12/05/2019, 2:37 PM

## 2019-12-06 ENCOUNTER — Inpatient Hospital Stay (HOSPITAL_COMMUNITY): Payer: No Typology Code available for payment source

## 2019-12-06 ENCOUNTER — Inpatient Hospital Stay (HOSPITAL_COMMUNITY): Payer: No Typology Code available for payment source | Admitting: Speech Pathology

## 2019-12-06 ENCOUNTER — Inpatient Hospital Stay (HOSPITAL_COMMUNITY): Payer: No Typology Code available for payment source | Admitting: Occupational Therapy

## 2019-12-06 MED ORDER — FUROSEMIDE 20 MG PO TABS
20.0000 mg | ORAL_TABLET | Freq: Two times a day (BID) | ORAL | Status: DC
  Administered 2019-12-06 – 2019-12-20 (×28): 20 mg via ORAL
  Filled 2019-12-06 (×28): qty 1

## 2019-12-06 NOTE — Progress Notes (Signed)
Speech Language Pathology Weekly Progress and Session Note  Patient Details  Name: Charles Sherman MRN: [id number] Date of Birth: 1945-10-15  Beginning of progress report period: November 29, 2019 End of progress report period: December 06, 2019  Today's Date: 12/06/2019 SLP Individual Time: 0174-9449 SLP Individual Time Calculation (min): 56 min  Short Term Goals: Week 1: SLP Short Term Goal 1 (Week 1): Pt will consume trials of regular textures X2, demonstrating efficient mastication and oral clearance with no more than Min A verbal cues for use of swallow strategies prior to advancement. SLP Short Term Goal 1 - Progress (Week 1): Progressing toward goal SLP Short Term Goal 2 (Week 1): Pt will demonstrate ability to problem solve basic to mildly complex tasks with Mod A verbal/visual cues. SLP Short Term Goal 2 - Progress (Week 1): Met SLP Short Term Goal 3 (Week 1): Pt will detect functional errors with Mod A verbal/visual cues. SLP Short Term Goal 3 - Progress (Week 1): Progressing toward goal SLP Short Term Goal 4 (Week 1): Pt will scan left visual field or attend to objects in left side of environment in 8/10 opportunities with Max A multimodal cues. SLP Short Term Goal 4 - Progress (Week 1): Met SLP Short Term Goal 5 (Week 1): Pt will recall 2 safety precautions with Mod A verbal/visual cues. SLP Short Term Goal 5 - Progress (Week 1): Met SLP Short Term Goal 6 (Week 1): Pt will selectively attend to functional tasks with Min A verbal and visual cues. SLP Short Term Goal 6 - Progress (Week 1): Met    New Short Term Goals: Week 2: SLP Short Term Goal 1 (Week 2): Pt will consume trials of regular textures X2, demonstrating efficient mastication and oral clearance with no more than Min A verbal cues for use of swallow strategies prior to advancement. SLP Short Term Goal 2 (Week 2): Pt will demonstrate ability to problem solve basic to mildly complex tasks with Min A verbal/visual  cues. SLP Short Term Goal 3 (Week 2): Pt will detect functional errors with Mod A verbal/visual cues. SLP Short Term Goal 4 (Week 2): Pt will scan left visual field or attend to objects in left side of environment in 8/10 opportunities with Mod A multimodal cues. SLP Short Term Goal 5 (Week 2): Pt will selectively attend to functional tasks with Supervision A verbal and visual cues.  Weekly Progress Updates: Pt has made functional gains and met 4 out of 6 short term goals. Pt is currently ~Mod assist for basic familiar tasks due to cognitive impairments impacting his problem solving, emergent awareness, selective attention, and left visual inattention. Pt is consuming a dysphagia 3 (mechanical soft) diet with thin liquids and is Supervision-Mod I for use of strategies for safe swallowing, however still requires cues for sustained attention and left visual scanning when eating meals. He is participating in trials of regular textures with SLP to work toward advancement. Pt has demonstrated improved basic problem solving, attention, and recall within tasks. Pt education is ongoing; no family has been present for ST sessions yet. Pt would continue to benefit from skilled ST while inpatient in order to maximize functional independence and reduce burden of care prior to discharge. Anticipate that pt will need 24/7 supervision at discharge in addition to [place] follow up at next level of care.       Intensity: Minumum of 1-2 x/day, 30 to 90 minutes Frequency: 3 to 5 out of 7 days Duration/Length of Stay: 12/25/19  Treatment/Interventions: Cognitive remediation/compensation;Cueing hierarchy;Functional tasks;Patient/family education;Dysphagia/aspiration precaution training;Internal/external aids   Daily Session  Skilled Therapeutic Interventions: Pt was seen for skilled ST targeting dysphagia and cognitive goals. SLP provided upgraded trial of regular texture solids and thin H2O to assess readiness for solid  advancement. Pt's mastication was mildly prolonged but functional and he demonstrated ability to achieve full oral clearance Mod I for use of strategies to clear left buccal cavity. No overt s/sx aspiration noted across solid or liquid textures. Would recommend continue regular diet for now and attempt larger quantity trial of regular textures at next available session to better assess readiness for advancement. SLP further facilitated session with a basic to mildly complex PEG board task. Pt required overall Max A for error awareness and left visual scanning, Mod A for problem solving to recreate designs on a PEG board from photo representation. He required Min A verbal cues for redirection in order to selectively attend to activities throughout session. Continuous verbal cues and a significant amount of extra time required for initiation during PEG task, and he only complete 1 full design, and a portion of another. `Pt left laying in bed with alarm set and needs within reach. Continue per current plan of care.          Pain Pain Assessment Pain Scale: 0-10 Pain Score: 0-No pain Pain Type: Acute pain  Therapy/Group: Individual Therapy  Arbutus Leas 12/06/2019, 3:03 PM

## 2019-12-06 NOTE — Progress Notes (Signed)
[facility] PHYSICAL MEDICINE & REHABILITATION PROGRESS NOTE   Subjective/Complaints:  No issues overnight working with OT, patient states that right eye closure has been present since April and has seen an eye doctor but does not recall the name of the eye doctor.  ROS: Denies CP, SOB, N/V/D  Objective:   No results found. No results for input(s): WBC, HGB, HCT, PLT in the last 72 hours. No results for input(s): NA, K, CL, CO2, GLUCOSE, BUN, CREATININE, CALCIUM in the last 72 hours.  Intake/Output Summary (Last 24 hours) at 12/06/2019 0839 Last data filed at 12/05/2019 1700 Gross per 24 hour  Intake 500 ml  Output --  Net 500 ml        Physical Exam: Vital Signs Blood pressure (!) 101/58, pulse 62, temperature 98 F (36.7 C), resp. rate 16, height 5\' 11"  (1.803 m), weight 104.3 kg, SpO2 96 %.  General: No acute distress Mood and affect are appropriate Heart: Regular rate and rhythm no rubs murmurs or extra sounds Lungs: Clear to auscultation, breathing unlabored, no rales or wheezes Abdomen: Positive bowel sounds, soft nontender to palpation, nondistended Extremities: No clubbing, cyanosis, or edema Skin: No evidence of breakdown, no evidence of rash HEENT, no discharge from right eye difficulty opening it even when assisted by author.  Indicates he can see out of the eye but cannot tell me exactly what he sees.  LLE: Hip flexion 0/5, knee extension 2-/5, ankle dorsiflexion 0/5, Left plantar flexion 2-/5 Left UE 2- elbow flex/ext, trace finger flexion  Assessment/Plan: 1. Functional deficits secondary to Right ICA distribution infarcts  which require 3+ hours per day of interdisciplinary therapy in a comprehensive inpatient rehab setting.  Physiatrist is providing close team supervision and 24 hour management of active medical problems listed below.  Physiatrist and rehab team continue to assess barriers to discharge/monitor patient progress toward functional and  medical goals  Care Tool:  Bathing    Body parts bathed by patient: Left arm, Chest, Abdomen, Right upper leg, Left upper leg, Right lower leg, Face   Body parts bathed by helper: Left lower leg, Right arm, Front perineal area, Buttocks     Bathing assist Assist Level: Maximal Assistance - Patient 24 - 49% (sit to stand EOB)     Upper Body Dressing/Undressing Upper body dressing   What is the patient wearing?: Pull over shirt    Upper body assist Assist Level: Maximal Assistance - Patient 25 - 49%    Lower Body Dressing/Undressing Lower body dressing      What is the patient wearing?: Incontinence brief, Pants     Lower body assist Assist for lower body dressing: Maximal Assistance - Patient 25 - 49%     Toileting Toileting    Toileting assist Assist for toileting: 2 Helpers (assist to hold the urinal while standing secondary to bladder urgency)     Transfers Chair/bed transfer  Transfers assist     Chair/bed transfer assist level: Dependent - mechanical lift (stedy)     Locomotion Ambulation   Ambulation assist   Ambulation activity did not occur: Safety/medical concerns          Walk 10 feet activity   Assist           Walk 50 feet activity   Assist           Walk 150 feet activity   Assist           Walk 10 feet on uneven surface  activity   Assist Walk 10 feet on uneven surfaces activity did not occur: Safety/medical concerns         Wheelchair     Assist Will patient use wheelchair at discharge?: Yes Type of Wheelchair: Manual Wheelchair activity did not occur: Safety/medical concerns  Wheelchair assist level: Total Assistance - Patient < 25%      Wheelchair 50 feet with 2 turns activity    Assist        Assist Level: Total Assistance - Patient < 25%   Wheelchair 150 feet activity     Assist      Assist Level: Total Assistance - Patient < 25%   Blood pressure (!) 101/58, pulse 62,  temperature 98 F (36.7 C), resp. rate 16, height 5\' 11"  (1.803 m), weight 104.3 kg, SpO2 96 %.   Medical Problem List and Plan: 1.  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  Status post right ICA revascularization with stent assisted angioplasty 11/21/2019  Continue CIR PT, OT, SLP    WHO/PRAFO ordered 2.  Antithrombotics: -DVT/anticoagulation: Lovenox             -antiplatelet therapy: Aspirin 81 mg daily and Brilinta 90 mg twice daily 3. Pain Management: Well controlled 4. Mood: Provide emotional support             -antipsychotic agents: N/A 5. Neuropsych: This patient is capable of making decisions on his own behalf. 6. Skin/Wound Care: Routine skin checks 7. Fluids/Electrolytes/Nutrition: Routine in and outs 8.  Right medial sphenoid wing meningioma.  Followed by neurosurgery.  Decadron protocol.  No current indications for surgical intervention at this time 9.  Hypertension.  Norvasc 10 mg daily, Zaroxolyn 2.5 mg 1/day on Monday, Lopressor 12.5 mg twice daily, Imdur 120 mg daily. BP is well controlled to soft  Vitals:   12/05/19 1924 12/06/19 0507  BP: (!) 100/59 (!) 101/58  Pulse: (!) 59 62  Resp:    Temp: 98.2 F (36.8 C) 98 F (36.7 C)  SpO2: 99% 96%   Soft  10.  Diastolic congestive heart failure.  metoprolol, Lasix 40 mg twice daily.  Monitor for any signs of fluid overload  Lasix decreased to 20mg  BID  Daily weights ordered Filed Weights   11/28/19 1707 12/05/19 0403  Weight: 110.8 kg 104.3 kg  11.  Hyperlipidemia.  Lipitor 12.  History of COPD/ tobacco abuse.  Counseling.  Albuterol inhaler as needed 13.  GERD.  Protonix 14.  Post stroke dysphagia.  Advanced to Dysphagia #3 thin liquids.  Follow-up speech therapy 15. Bradycardic:on BB monitor TID., D/C BB  , checked 10/8 EKG sinus brady rate 49 16.  Morbid  obesity: Encourage weight loss #17.  Left eye closure question blepharospasm, will see if family can supply name of optometrist or ophthalmologist to get further information.  LOS: 8 days A FACE TO FACE EVALUATION WAS PERFORMED  Charlett Blake 12/06/2019, 8:39 AM

## 2019-12-06 NOTE — Progress Notes (Signed)
Physical Therapy Session Note  Patient Details  Name: Charles Sherman MRN: [id number] Date of Birth: 1945-03-29  Today's Date: 12/06/2019 PT Individual Time: 3159-4585 PT Individual Time Calculation (min): 70 min   Short Term Goals: Week 1:  PT Short Term Goal 1 (Week 1): Pt will transfer sup to sit w/ max A of 1. PT Short Term Goal 2 (Week 1): Pt will sit EOB w/ mod A x 3' PT Short Term Goal 3 (Week 1): Pt will transfer sit to stand w/ Stedy and max A w/ improved midline standing position.  Skilled Therapeutic Interventions/Progress Updates:    Patient received sitting up in wc agreeable to PT. He denies pain. PT propelling patient in wc to therapy gym for time management. He was able to transfer to therapy mat via squat pivot with ModAx2. Static sitting balance maintained x2-3 mins with stand by assist and verbal cuing for postural control. Patient with intermittent L lateral leaning or posterior leaning, but with mirror for visual feedback, patient able to self correct >75% of the time. STS in [place] completed with R lateral leaning task to engage R UE to prevent pushing to the L. Patient was educated on midline orientation and use of visual feedback to find midline. Dynamic sitting task complete edge of mat with R lateral leaning to retrieve object. When object was presented from L, verbal cues needed to find object initially due to his R gaze preference. STS x4 with 3-musketeers assist, MinA x2. Standing L quad sets to achieve full L knee extension, mirror for visual feedback provided. Tactile cuing needed to engage L quad. Patient attempting marching in place, but was found to be pushing to there L with R LE. Unable to achieve full weight shift L to lift R LE. Patient with episode of urinary incontinence while standing. ModA x2 squat pivot back to wc and then to bed for pericare. Patient able to perform peri hygiene with was cloth and verbal cuing. Rolling in bed, MinA x1 + verbal cuing for  sequencing. Patient requesting to remain in bed, bed alarm on, call light within reach.   Therapy Documentation Precautions:  Precautions Precautions: Fall Precaution Comments: left neglect, left hemiparesis, slight pusher to the left Restrictions Weight Bearing Restrictions: No    Therapy/Group: Individual Therapy  Karoline Caldwell, PT, DPT, CBIS 12/06/2019, 7:46 AM

## 2019-12-06 NOTE — Progress Notes (Signed)
Occupational Therapy Weekly Progress Note  Patient Details  Name: Charles Sherman MRN: [id number] Date of Birth: 1945-03-30  Beginning of progress report period: November 29, 2019 End of progress report period: January 06, 2020  Today's Date: 12/06/2019 OT Individual Time: 0800-0901 OT Individual Time Calculation (min): 61 min    Patient has met 4 of 4 short term goals.  Pt is making steady progress with OT at this time.  He currently completes UB bathing in unsupported sitting with overall min assist and max instructional cueing to sequence.  LB bathing is also at max assist level sit to stand.  He continues to demonstrate decreased sustained attention with presentation of right head turn.  While bathing he needs mod instructional cueing to scan left of midline for locating his washcloth and other items placed on the left side.  He will wash his face for example but then will stop and sit, staring at the ground, thus requiring mod to max instructional cueing to proceed with washing the next body part.  He is able to state correct procedure for hemi dressing techniques when asked which side to start with , but needs max assist for threading his left arm or leg.  Mod assist to complete donning a pullover shirt with max assist for brief and pants.  He is able to complete sit to stand with mod assist but needs max assist to maintain standing in order to pull items over his hips.  Increased pushing to the left in standing with a decrease in sitting.  He is able to maintain static sitting balance with supervision but does exhibit a slightly held lean to the left at times.  Dynamic sitting balance is at an overall mod assist during selfcare tasks as he fatigues.  LUE use is currently at [facility] stage III in the arm with synergy developing and at a [place] stage II in the hand.  Max hand over hand assist is needed for integration into selfcare tasks.  Overall feel he is making really good progress.   He continues with right eye closure which is premorbid as well as current left visual field deficit.  Recommend continued CIR level therapy to work toward min to mod assist level goals.    Patient continues to demonstrate the following deficits: muscle weakness and muscle paralysis, impaired timing and sequencing, abnormal tone, unbalanced muscle activation and decreased coordination, field cut, decreased midline orientation, decreased attention to left and left side neglect, decreased initiation, decreased attention, decreased awareness, decreased problem solving and delayed processing and decreased sitting balance, decreased standing balance, decreased postural control, hemiplegia and decreased balance strategies and therefore will continue to benefit from skilled OT intervention to enhance overall performance with BADL and Reduce care partner burden.  Patient progressing toward long term goals..  Continue plan of care.  OT Short Term Goals Week 2:  OT Short Term Goal 1 (Week 2): Pt will completed UB dressing with min assist following hemi dressing techiques. OT Short Term Goal 2 (Week 2): Pt will complete LB bathing with mod assist sit to stand. OT Short Term Goal 3 (Week 2): Pt will complete LB dressing with mod assist for brief and pants only. OT Short Term Goal 4 (Week 2): Pt will scan left of midline with no more than min instructional cueing to locate selfcare items during bathing tasks.  Skilled Therapeutic Interventions/Progress Updates:    Pt worked on bathing and dressing sit to stand EOB during session.  He was able to  transfer from supine to sit with mod assist and maintain static sitting balance with close supervision.  UB bathing was completed with overall min assist but max facilitation was needed for washing the RUE with use of the left hand.  He needed overall max instructional cueing as well for sequencing and sustained attention as he would stop after washing each body part without  progression to the next.  Sit to stand was completed with mod assist, but max assist was needed for standing to complete peri washing and pulling items over hips.  He needed mod assist for donning pullover shirt with therapist assisting with locating the left arm sleeve and setting it up for him.  He completed partial stand pivot transfer to the wheelchair after dressing with overall max assist.  Therapist provided total assist for TEDs and gripper socks.  He was left sitting up with the call button and phone in reach with safety belt in place.    Therapy Documentation Precautions:  Precautions Precautions: Fall Precaution Comments: left neglect, left hemiparesis, slight pusher to the left Restrictions Weight Bearing Restrictions: No  Pain: Pain Assessment Pain Scale: Faces Pain Score: 0-No pain Pain Type: Acute pain ADL: See Care Tool Section for some details of mobility and selfcare  Therapy/Group: Individual Therapy  Wheeler Incorvaia OTR/L 12/06/2019, 9:20 AM

## 2019-12-07 ENCOUNTER — Inpatient Hospital Stay (HOSPITAL_COMMUNITY): Payer: No Typology Code available for payment source | Admitting: Occupational Therapy

## 2019-12-07 ENCOUNTER — Inpatient Hospital Stay (HOSPITAL_COMMUNITY): Payer: No Typology Code available for payment source | Admitting: Speech Pathology

## 2019-12-07 ENCOUNTER — Inpatient Hospital Stay (HOSPITAL_COMMUNITY): Payer: No Typology Code available for payment source | Admitting: Physical Therapy

## 2019-12-07 NOTE — Progress Notes (Signed)
Occupational Therapy Session Note  Patient Details  Name: Charles Sherman MRN: [id number] Date of Birth: 09/12/1945  Today's Date: 12/07/2019 OT Individual Time: 0100-7121 OT Individual Time Calculation (min): 56 min    Short Term Goals: Week 2:  OT Short Term Goal 1 (Week 2): Pt will completed UB dressing with min assist following hemi dressing techiques. OT Short Term Goal 2 (Week 2): Pt will complete LB bathing with mod assist sit to stand. OT Short Term Goal 3 (Week 2): Pt will complete LB dressing with mod assist for brief and pants only. OT Short Term Goal 4 (Week 2): Pt will scan left of midline with no more than min instructional cueing to locate selfcare items during bathing tasks.  Skilled Therapeutic Interventions/Progress Updates:    Pt completed bathing from shower level this session.  Charlaine Dalton was used for transfer into the shower with total assist.  During bathing he exhibited increased lean to the left throughout requiring max instructional cueing to try and return back to midline while bathing.  He needed overall max assist for sitting balance with min assist for UB bathing and max assist for LB bathing sit to stand.  He transferred to the wheelchair for dressing with use of the [place].  He was able to complete UB dressing with mod assist and then completed LB dressing max assist sit to stand.  Scanning to the left incorporated throughout session as he keeps his head turned to the right.  Max hand over hand assist for integration of the LUE to wash the RUE as well as for holding his deodorant to open and apply.  He was left in the wheelchair at the end of the session with call button and phone in reach and safety belt in place.    Therapy Documentation Precautions:  Precautions Precautions: Fall Precaution Comments: left neglect, left hemiparesis, slight pusher to the left Restrictions Weight Bearing Restrictions: No  Pain: Pain Assessment Pain Scale: Faces Pain Score:  0-No pain ADL: See Care Tool Section for some details of mobility and selfcare  Therapy/Group: Individual Therapy  Ileigh Mettler OTR/L 12/07/2019, 12:23 PM

## 2019-12-07 NOTE — Plan of Care (Signed)
  Problem: Sit to Stand Goal: LTG:  Patient will perform sit to stand with assistance level (PT) Description: LTG:  Patient will perform sit to stand with assistance level (PT) Flowsheets (Taken 12/07/2019 1909) LTG: PT will perform sit to stand in preparation for functional mobility with assistance level: (upgraded based on pt progress) Minimal Assistance - Patient > 75% Note: upgraded based on pt progress   Problem: RH Bed Mobility Goal: LTG Patient will perform bed mobility with assist (PT) Description: LTG: Patient will perform bed mobility with assistance, with/without cues (PT). Flowsheets (Taken 12/07/2019 1909) LTG: Pt will perform bed mobility with assistance level of: (upgraded based on pt progress) Contact Guard/Touching assist Note: upgraded based on pt progress   Problem: RH Bed to Chair Transfers Goal: LTG Patient will perform bed/chair transfers w/assist (PT) Description: LTG: Patient will perform bed to chair transfers with assistance (PT). Flowsheets (Taken 12/07/2019 1909) LTG: Pt will perform Bed to Chair Transfers with assistance level: (upgraded based on pt progress) Minimal Assistance - Patient > 75% Note: upgraded based on pt progress   Problem: RH Furniture Transfers Goal: LTG Patient will perform furniture transfers w/assist (OT/PT) Description: LTG: Patient will perform furniture transfers  with assistance (OT/PT). Flowsheets (Taken 12/07/2019 1909) LTG: Pt will perform furniture transfers with assist:: (upgraded based on pt progress) Minimal Assistance - Patient > 75% Note: upgraded based on pt progress   Problem: RH Ambulation Goal: LTG Patient will ambulate in controlled environment (PT) Description: LTG: Patient will ambulate in a controlled environment, # of feet with assistance (PT). Flowsheets (Taken 12/07/2019 1909) LTG: Pt will ambulate in controlled environ  assist needed:: (added goal based on pt progress) Minimal Assistance - Patient > 75% LTG:  Ambulation distance in controlled environment: 173ft using LRAD Note: added goal based on pt progress Goal: LTG Patient will ambulate in home environment (PT) Description: LTG: Patient will ambulate in home environment, # of feet with assistance (PT). Flowsheets (Taken 12/07/2019 1909) LTG: Pt will ambulate in home environ  assist needed:: (added goal based on pt progress) Minimal Assistance - Patient > 75% LTG: Ambulation distance in home environment: 7ft using LRAD Note: added goal based on pt progress

## 2019-12-07 NOTE — Progress Notes (Signed)
Speech Language Pathology Daily Session Note  Patient Details  Name: Charles Sherman MRN: [id number] Date of Birth: 08-12-1945  Today's Date: 12/07/2019 SLP Individual Time: 1779-3903 SLP Individual Time Calculation (min): 58 min  Short Term Goals: Week 2: SLP Short Term Goal 1 (Week 2): Pt will consume trials of regular textures X2, demonstrating efficient mastication and oral clearance with no more than Min A verbal cues for use of swallow strategies prior to advancement. SLP Short Term Goal 2 (Week 2): Pt will demonstrate ability to problem solve basic to mildly complex tasks with Min A verbal/visual cues. SLP Short Term Goal 3 (Week 2): Pt will detect functional errors with Mod A verbal/visual cues. SLP Short Term Goal 4 (Week 2): Pt will scan left visual field or attend to objects in left side of environment in 8/10 opportunities with Mod A multimodal cues. SLP Short Term Goal 5 (Week 2): Pt will selectively attend to functional tasks with Supervision A verbal and visual cues.  Skilled Therapeutic Interventions: Pt was seen for skilled ST targeting dysphagia and cognitive goals. SLP facilitated session with a trail lunch tray of regular textures to assess potential for upgrade. He required Supervision A verbal cues to slow rate of intake and use of strategies for clearance of very minimal left buccal pocketing of meat. Timeliness of intake was relatively improved today in comparison to previous sessions. No overt s/sx aspiration noted across textures. Recommend pt be upgraded to regular textures, continue thin liquids and meds whole in puree, full supervision to cue for swallow strategies as well as attention and assistance locating items on left side of tray during meals. ST will continue to provide skilled interventions for swallowing to increase independence with swallowing strategies, ensure tolerance, and work toward reducing supervision needs.  SLP further facilitated session with a  semi-complex money scenario/grocery budgeting and calculations task. Pt required Min A verbal cues to selectively attend to tasks in a mildly distracting environment. Overall Mod A verbal cues were required for problem solving and error awareness. Overall Mod A verbal and visual cues were also required for visual scanning and locating items on let of tray during functional and structured tasks today. Pt left sitting in tilt in space chair with alarm set and needs within reach. Continue per current plan of care.          Pain Pain Assessment Pain Scale: 0-10 Pain Score: 0-No pain  Therapy/Group: Individual Therapy  Arbutus Leas 12/07/2019, 2:59 PM

## 2019-12-07 NOTE — Progress Notes (Signed)
[facility] PHYSICAL MEDICINE & REHABILITATION PROGRESS NOTE   Subjective/Complaints:  Patient watching TV with both eyes open however when mother commented on the right eye being open he promptly closed it.  ROS: Denies CP, SOB, N/V/D  Objective:   No results found. No results for input(s): WBC, HGB, HCT, PLT in the last 72 hours. No results for input(s): NA, K, CL, CO2, GLUCOSE, BUN, CREATININE, CALCIUM in the last 72 hours.  Intake/Output Summary (Last 24 hours) at 12/07/2019 0838 Last data filed at 12/07/2019 0805 Gross per 24 hour  Intake 760 ml  Output --  Net 760 ml        Physical Exam: Vital Signs Blood pressure 126/62, pulse (!) 51, temperature 98 F (36.7 C), temperature source Oral, resp. rate 18, height 5\' 11"  (1.803 m), weight 106.5 kg, SpO2 93 %.  General: No acute distress Mood and affect are appropriate Heart: Regular rate and rhythm no rubs murmurs or extra sounds Lungs: Clear to auscultation, breathing unlabored, no rales or wheezes Abdomen: Positive bowel sounds, soft nontender to palpation, nondistended Extremities: No clubbing, cyanosis, or edema Skin: No evidence of breakdown, no evidence of rash   LLE: Hip flexion 0/5, knee extension 2-/5, ankle dorsiflexion 0/5, Left plantar flexion 2-/5 Left UE 2- elbow flex/ext, trace finger flexion  Assessment/Plan: 1. Functional deficits secondary to Right ICA distribution infarcts  which require 3+ hours per day of interdisciplinary therapy in a comprehensive inpatient rehab setting.  Physiatrist is providing close team supervision and 24 hour management of active medical problems listed below.  Physiatrist and rehab team continue to assess barriers to discharge/monitor patient progress toward functional and medical goals  Care Tool:  Bathing    Body parts bathed by patient: Left arm, Chest, Abdomen, Right upper leg, Left upper leg, Right lower leg, Face   Body parts bathed by helper: Left lower leg,  Right arm, Front perineal area, Buttocks     Bathing assist Assist Level: Maximal Assistance - Patient 24 - 49%     Upper Body Dressing/Undressing Upper body dressing   What is the patient wearing?: Pull over shirt    Upper body assist Assist Level: Moderate Assistance - Patient 50 - 74%    Lower Body Dressing/Undressing Lower body dressing      What is the patient wearing?: Incontinence brief, Pants     Lower body assist Assist for lower body dressing: Maximal Assistance - Patient 25 - 49%     Toileting Toileting    Toileting assist Assist for toileting: 2 Helpers (assist to hold the urinal while standing secondary to bladder urgency)     Transfers Chair/bed transfer  Transfers assist     Chair/bed transfer assist level: 2 Helpers     Locomotion Ambulation   Ambulation assist   Ambulation activity did not occur: Safety/medical concerns          Walk 10 feet activity   Assist           Walk 50 feet activity   Assist           Walk 150 feet activity   Assist           Walk 10 feet on uneven surface  activity   Assist Walk 10 feet on uneven surfaces activity did not occur: Safety/medical concerns         Wheelchair     Assist Will patient use wheelchair at discharge?: Yes Type of Wheelchair: Manual Wheelchair activity did not occur:  Safety/medical concerns  Wheelchair assist level: Total Assistance - Patient < 25%      Wheelchair 50 feet with 2 turns activity    Assist        Assist Level: Total Assistance - Patient < 25%   Wheelchair 150 feet activity     Assist      Assist Level: Total Assistance - Patient < 25%   Blood pressure 126/62, pulse (!) 51, temperature 98 F (36.7 C), temperature source Oral, resp. rate 18, height 5\' 11"  (1.803 m), weight 106.5 kg, SpO2 93 %.   Medical Problem List and Plan: 1.  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  Status post right ICA revascularization with stent assisted angioplasty 11/21/2019  Continue CIR PT, OT, SLP    WHO/PRAFO ordered 2.  Antithrombotics: -DVT/anticoagulation: Lovenox             -antiplatelet therapy: Aspirin 81 mg daily and Brilinta 90 mg twice daily 3. Pain Management: Well controlled 4. Mood: Provide emotional support             -antipsychotic agents: N/A 5. Neuropsych: This patient is capable of making decisions on his own behalf. 6. Skin/Wound Care: Routine skin checks 7. Fluids/Electrolytes/Nutrition: Routine in and outs 8.  Right medial sphenoid wing meningioma.  Followed by neurosurgery.  Decadron protocol.  No current indications for surgical intervention at this time 9.  Hypertension.  Norvasc 10 mg daily, Zaroxolyn 2.5 mg 1/day on Monday, Lopressor 12.5 mg twice daily, Imdur 120 mg daily. BP is well controlled to soft  Vitals:   12/06/19 2046 12/07/19 0435  BP: (!) 113/57 126/62  Pulse: (!) 54 (!) 51  Resp: 18   Temp:  98 F (36.7 C)  SpO2: 98% 93%   Soft BP, asymptomatic bradycardia will stop lopressor  10.  Diastolic congestive heart failure.  metoprolol, Lasix 40 mg twice daily.  Monitor for any signs of fluid overload  Lasix decreased to 20mg  BID  Daily weights ordered Filed Weights   11/28/19 1707 12/05/19 0403 12/07/19 0500  Weight: 110.8 kg 104.3 kg 106.5 kg  11.  Hyperlipidemia.  Lipitor 12.  History of COPD/ tobacco abuse.  Counseling.  Albuterol inhaler as needed 13.  GERD.  Protonix 14.  Post stroke dysphagia.  Advanced to Dysphagia #3 thin liquids.  Follow-up speech therapy 15. Bradycardic:on BB monitor TID., D/C BB  , checked 10/8 EKG sinus brady rate 49, asymptomatic Vitals:   12/07/19 0435 12/07/19 0851  BP: 126/62 128/67  Pulse: (!) 51   Resp:    Temp: 98 F (36.7 C)   SpO2: 93%     16.  Morbid obesity: Encourage  weight loss #17.  Left eye closure question blepharospasm, will see if family can supply name of optometrist or ophthalmologist to get further information.  LOS: 9 days A FACE TO FACE EVALUATION WAS PERFORMED  Charlett Blake 12/07/2019, 8:38 AM

## 2019-12-07 NOTE — Progress Notes (Signed)
Physical Therapy Weekly Progress Note  Patient Details  Name: Charles Sherman MRN: [id number] Date of Birth: Apr 20, 1945  Beginning of progress report period: November 29, 2019 End of progress report period: December 07, 2019   Patient has met 3 of 3 short term goals. Charles Sherman is progressing well with therapy demonstrating increasing independence with functional mobility. He continues to demonstrate L inattention with R gaze preference - with cuing he is able to orient to L side to perform a 1 step task but then requires cuing again to reorient back to that side once that task completed. He continues to demonstrate pusher tendencies causing L lean that is improving in sitting with pt able to sustain static sitting balance with supervision but continues to require up to mod assist for dynamic sitting balance. He is performing supine<>sit with max progressing towards mod assist for trunk control. He performs sit<>stands with mod/max assist of 1 to come to standing but then +2 assist for dynamic standing balance due to onset of L lean/pushing. Performs stand pivot transfers with +2 mod assist and has progressed to ambulating up to 69f with +2 mod assist. During ambulation pt demos ability to advance L LE during swing but has onset of flexor tone causing facilitation/assist for proper positioning prior to initiating stance - demos slight L knee flexion during stance that improves with minimal cuing (no buckling). Patient will benefit from continued CIR level therapies to continue progressing independence.  Patient continues to demonstrate the following deficits muscle weakness, muscle joint tightness and muscle paralysis, decreased cardiorespiratoy endurance, impaired timing and sequencing, abnormal tone, unbalanced muscle activation, motor apraxia, decreased coordination and decreased motor planning, decreased visual perceptual skills and decreased visual motor skills, decreased attention to left,  decreased initiation, decreased attention, decreased awareness, decreased problem solving, decreased safety awareness, decreased memory and delayed processing,   and decreased sitting balance, decreased standing balance, decreased postural control, hemiplegia and decreased balance strategies and therefore will continue to benefit from skilled PT intervention to increase functional independence with mobility.  Patient progressing toward long term goals. Upgraded LTGs and added ambulation goal based on pt progress.  Plan of care revisions: added the below therapeutic interventions to pt's POC.  PT Short Term Goals Week 1:  PT Short Term Goal 1 (Week 1): Pt will transfer sup to sit w/ max A of 1. PT Short Term Goal 1 - Progress (Week 1): Met PT Short Term Goal 2 (Week 1): Pt will sit EOB w/ mod A x 3' PT Short Term Goal 2 - Progress (Week 1): Met PT Short Term Goal 3 (Week 1): Pt will transfer sit to stand w/ Stedy and max A w/ improved midline standing position. PT Short Term Goal 3 - Progress (Week 1): Met Week 2:  PT Short Term Goal 1 (Week 2): Pt will consistently perform supine<>sit with mod assist PT Short Term Goal 2 (Week 2): Pt will consistently perform sit<>stand with mod assist of 1 PT Short Term Goal 3 (Week 2): Pt will consistently perform bed<>chair transfers with mod assist of 1 PT Short Term Goal 4 (Week 2): Pt will ambulate at least 537fusing LRAD with mod assist of 1 (+2 w/c follow if needed)  Skilled Therapeutic Interventions:  Ambulation/gait training;Discharge planning;DME/adaptive equipment instruction;Functional mobility training;Therapeutic Activities;UE/LE Strength taining/ROM;Balance/vestibular training;Patient/family education;Stair training;Therapeutic Exercise;UE/LE Coordination activities;Wheelchair propulsion/positioning;Functional electrical stimulation;Pain management;Skin care/wound management;Community reintegration;Psychosocial support;Neuromuscular  re-education;Cognitive remediation/compensation;Disease management/prevention;Visual/perceptual remediation/compensation;Splinting/orthotics   Therapy Documentation Precautions:  Precautions Precautions: Fall Precaution Comments: left neglect,  left hemiparesis, slight pusher to the left Restrictions Weight Bearing Restrictions: No   [name] , PT, DPT, CSRS  12/07/2019, 7:03 PM

## 2019-12-07 NOTE — Progress Notes (Signed)
Physical Therapy Session Note  Patient Details  Name: Charles Sherman MRN: [id number] Date of Birth: Mar 31, 1945  Today's Date: 12/07/2019 PT Individual Time: [phone] and [id number] PT Individual Time Calculation (min): 40 min and 27 min  Short Term Goals: Week 1:  PT Short Term Goal 1 (Week 1): Pt will transfer sup to sit w/ max A of 1. PT Short Term Goal 2 (Week 1): Pt will sit EOB w/ mod A x 3' PT Short Term Goal 3 (Week 1): Pt will transfer sit to stand w/ Stedy and max A w/ improved midline standing position.  Skilled Therapeutic Interventions/Progress Updates:    Session 1: Pt received supine in bed with RN present for medication administration and pt agreeable to therapy session. Supine donned B LE TED hose and socks max assist - pt able to assist with lifting LEs. Supine>sitting L EOB, HOB elevated ~20degrees and using bedrail, with min assist for trunk upright and slight assist for L LE management off EOB - cuing for sequencing to increase pt independent. Once sitting EOB, pt demos slight L posterior lean but able to maintain balance with close supervision for safety using R UE support as needed - therapist threaded pants max A with pt able to lift LEs without assist. Sit<>stand to/from EOB, B UE support via +2 assist, with +2 min assist for lifting to stand and +2 mod assist for balance due to L lean while pulling pants up over hips max assist. R stand pivot EOB>w/c, no AD, mod assist of 1 and +2 min assist - facilitating weight shifting to promote stepping.  Transported to/from gym in w/c for time management and energy conservation. Gait training ~107ft x2 using 3 Musketeer suport with +2 mod assist for balance - min assist for L LE advancement as pt able to bring it through but some assist needed for increased abduction, pt showed increased L LE flexor tone during 2nd walk requiring more assist for L LE positioning prior to stance phase - slight facilitation for L knee extension during  stance and for anterior pelvic movement to advance over L stance limb - +3 assist w/c follow first trial then 2nd trial only +2 assist with pt able to turn and sit on mat at end of walk - also demoed increased L lean/push during 2nd walk - therapist provided max sequential cuing for stepping pattern throughout gait. R stand pivot EOM>w/c with mod assist of 1 and +2 min assist as described above. Transported back to room and pt agreeable to remain seated up in w/c - left tilted back with needs in reach, L UE supported on pillow, and seat belt alarm on.  Session 2: Pt received sitting tilted back in TIS w/c with his hips scooted forward in the chair with pt reporting he has been trying to get back in the bed for ~32minutes - therapist reinforced education on how to use call bell to receive nursing assistance for safe transfer back to bed - pt will benefit from continued education on this. Pt continues to demo L inattention with R gaze preference - repeated cuing for L visual scanning throughout session.  Transported to/from gym in w/c for time management and energy conservation. L stand pivot w/c>EOM with mod assist of 1 and +2 min assist for safety - therapist facilitating R/L weight shift to promote stepping and facilitating improved L LE positioning after stepping - when going to sit on mat pt demoed strong L posterior lean requiring max assist to maintain  upright, once assisted back to midline pt able to maintain static sitting EOB with close supervision. Sit<>stands to/from EOM, HHA, with heavy mod assist of 1 and +2 min assist - therapist facilitating increased trunk and L hip/knee extension with mirror feedback on upright posture and midline orientation. In standing, pt demoing some fatigue and increased pushing tendencies causing progressive worsening of L anterior trunk lean requiring max assist of 1 and min assist of 2nd person to maintain standing - continued cuing with mirror feedback for upright  posture. Performed dynamic standing balance task via reaching R/L promoting L attention and R weight shift with pt having best response to reaching high target on R and placing on an even higher target in the midline to maintain upright posture with LE extension. R stand pivot EOM>w/c with mod assist of 1 and +2 min assist as described above. Transported back to room and pt requesting to return to bed. L stand pivot mod assist of 1 and +2 min assist as above with pt again demoing L posterior trunk LOB when going to sit requiring max assist to stay upright. Sit>supine with mod assist for trunk descent and pt able to bring LEs onto bed without assist. Pt left supine in bed with needs in reach, L UE supported on pillows, and bed alarm on.  Therapy Documentation Precautions:  Precautions Precautions: Fall Precaution Comments: left neglect, left hemiparesis, slight pusher to the left Restrictions Weight Bearing Restrictions: No  Pain:   Session 1: Denies pain during session.  Session 2: Denies pain during session.   Therapy/Group: Individual Therapy  [name] , PT, DPT, CSRS  12/07/2019, 7:52 AM

## 2019-12-08 ENCOUNTER — Inpatient Hospital Stay (HOSPITAL_COMMUNITY): Payer: No Typology Code available for payment source | Admitting: Occupational Therapy

## 2019-12-08 NOTE — Progress Notes (Signed)
[facility] PHYSICAL MEDICINE & REHABILITATION PROGRESS NOTE   Subjective/Complaints: Patient without complaints today.  He does not have any therapy today ROS: Denies CP, SOB, N/V/D  Objective:   No results found. No results for input(s): WBC, HGB, HCT, PLT in the last 72 hours. No results for input(s): NA, K, CL, CO2, GLUCOSE, BUN, CREATININE, CALCIUM in the last 72 hours.  Intake/Output Summary (Last 24 hours) at 12/08/2019 1312 Last data filed at 12/08/2019 0847 Gross per 24 hour  Intake 580 ml  Output --  Net 580 ml        Physical Exam: Vital Signs Blood pressure 132/65, pulse (!) 51, temperature 97.6 F (36.4 C), resp. rate 16, height 5\' 11"  (1.803 m), weight 106.7 kg, SpO2 100 %.   General: No acute distress Mood and affect are appropriate Heart: Regular rate and rhythm no rubs murmurs or extra sounds Lungs: Clear to auscultation, breathing unlabored, no rales or wheezes Abdomen: Positive bowel sounds, soft nontender to palpation, nondistended Extremities: No clubbing, cyanosis, or edema Skin: No evidence of breakdown, no evidence of rash  LLE: Hip flexion 0/5, knee extension 2-/5, ankle dorsiflexion 0/5, Left plantar flexion 2-/5 Left UE 2- elbow flex/ext, trace finger flexion  Assessment/Plan: 1. Functional deficits secondary to Right ICA distribution infarcts  which require 3+ hours per day of interdisciplinary therapy in a comprehensive inpatient rehab setting.  Physiatrist is providing close team supervision and 24 hour management of active medical problems listed below.  Physiatrist and rehab team continue to assess barriers to discharge/monitor patient progress toward functional and medical goals  Care Tool:  Bathing    Body parts bathed by patient: Left arm, Chest, Abdomen, Right upper leg, Left upper leg, Right lower leg, Face, Front perineal area   Body parts bathed by helper: Right arm, Buttocks, Left lower leg     Bathing assist Assist Level:  Maximal Assistance - Patient 24 - 49% (sit to stand in the shower)     Upper Body Dressing/Undressing Upper body dressing   What is the patient wearing?: Pull over shirt    Upper body assist Assist Level: Moderate Assistance - Patient 50 - 74%    Lower Body Dressing/Undressing Lower body dressing      What is the patient wearing?: Incontinence brief, Pants     Lower body assist Assist for lower body dressing: Maximal Assistance - Patient 25 - 49%     Toileting Toileting    Toileting assist Assist for toileting: 2 Helpers (assist to hold the urinal while standing secondary to bladder urgency)     Transfers Chair/bed transfer  Transfers assist     Chair/bed transfer assist level: 2 Helpers (stand pivot)     Locomotion Ambulation   Ambulation assist   Ambulation activity did not occur: Safety/medical concerns  Assist level: 2 helpers Assistive device: Other (comment) (3 Musketeer) Max distance: 46ft   Walk 10 feet activity   Assist     Assist level: 2 helpers Assistive device: Other (comment) (3 Musketeer)   Walk 50 feet activity   Assist    Assist level: 2 helpers Assistive device: Other (comment) (3 Musketeer)    Walk 150 feet activity   Assist           Walk 10 feet on uneven surface  activity   Assist Walk 10 feet on uneven surfaces activity did not occur: Safety/medical concerns         Wheelchair     Assist Will patient use  wheelchair at discharge?: Yes Type of Wheelchair: Manual Wheelchair activity did not occur: Safety/medical concerns  Wheelchair assist level: Total Assistance - Patient < 25%      Wheelchair 50 feet with 2 turns activity    Assist        Assist Level: Total Assistance - Patient < 25%   Wheelchair 150 feet activity     Assist      Assist Level: Total Assistance - Patient < 25%   Blood pressure 132/65, pulse (!) 51, temperature 97.6 F (36.4 C), resp. rate 16, height 5\' 11"   (1.803 m), weight 106.7 kg, SpO2 100 %.   Medical Problem List and Plan: 1.  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  Status post right ICA revascularization with stent assisted angioplasty 11/21/2019  Continue CIR PT, OT, SLP    WHO/PRAFO ordered 2.  Antithrombotics: -DVT/anticoagulation: Lovenox             -antiplatelet therapy: Aspirin 81 mg daily and Brilinta 90 mg twice daily 3. Pain Management: Well controlled 4. Mood: Provide emotional support             -antipsychotic agents: N/A 5. Neuropsych: This patient is capable of making decisions on his own behalf. 6. Skin/Wound Care: Routine skin checks 7. Fluids/Electrolytes/Nutrition: Routine in and outs 8.  Right medial sphenoid wing meningioma.  Followed by neurosurgery.  Decadron protocol.  No current indications for surgical intervention at this time 9.  Hypertension.  Norvasc 10 mg daily, Zaroxolyn 2.5 mg 1/day on Monday, Lopressor 12.5 mg twice daily, Imdur 120 mg daily. BP is well controlled to soft  Vitals:   12/07/19 1934 12/08/19 0524  BP: 100/61 132/65  Pulse: 66 (!) 51  Resp:    Temp: 97.8 F (36.6 C) 97.6 F (36.4 C)  SpO2: 97% 100%   Soft BP, asymptomatic bradycardia off lopressor  10.  Diastolic congestive heart failure.  metoprolol, Lasix 40 mg twice daily.  Monitor for any signs of fluid overload  Lasix decreased to 20mg  BID  Daily weights ordered Filed Weights   12/05/19 0403 12/07/19 0500 12/08/19 0423  Weight: 104.3 kg 106.5 kg 106.7 kg  11.  Hyperlipidemia.  Lipitor 12.  History of COPD/ tobacco abuse.  Counseling.  Albuterol inhaler as needed 13.  GERD.  Protonix 14.  Post stroke dysphagia.  Advanced to Dysphagia #3 thin liquids.  Follow-up speech therapy 15. Bradycardic:on BB monitor TID., D/C BB  , checked 10/8 EKG sinus brady rate 49,  asymptomatic Vitals:   12/07/19 1934 12/08/19 0524  BP: 100/61 132/65  Pulse: 66 (!) 51  Resp:    Temp: 97.8 F (36.6 C) 97.6 F (36.4 C)  SpO2: 97% 100%    16.  Morbid obesity: Encourage weight loss #17.  Left eye closure question blepharospasm, patient had his eye open today, when attention is focused upon it he tends to close the eye.  He denies any eye pain. LOS: 10 days A FACE TO FACE EVALUATION WAS PERFORMED  Charlett Blake 12/08/2019, 1:12 PM

## 2019-12-08 NOTE — Progress Notes (Signed)
Occupational Therapy Session Note  Patient Details  Name: Charles Sherman MRN: [id number] Date of Birth: October 05, 1945  Today's Date: 12/08/2019 OT Individual Time: 5997-7414 OT Individual Time Calculation (min): 42 min   Skilled Therapeutic Interventions/Progress Updates:    Pt greeted in bed with no c/o pain. NT was just setting him up with lunch. Pt was agreeable to sit EOB to work on his sitting balance while eating today during OT. Mod A for supine<sit with vcs and HOB elevated. He was able to use his Lt hand a bit to wash his Rt hand using the small sanitary wipe. Vcs for scanning towards the Lt side of his tray to find needed items/food. Pt able to maintain neutral midline for 45 sec-1 minute intervals with close supervision assist. He tended to lose balance posteriorly or laterally, Min-Mod A to correct at times though pt was able to correct himself with cuing alone ~75% of the time. Pt exhibited no pocketing when assessed at end of the meal. He then returned to bed and was boosted up with +2 assist, pt also assisting using the headboard with the Rt hand. Pt was repositioned to protect hemiplegic side, left him with all needs within reach and bed alarm set.    Therapy Documentation Precautions:  Precautions Precautions: Fall Precaution Comments: left neglect, left hemiparesis, slight pusher to the left Restrictions Weight Bearing Restrictions: No Vital Signs: Therapy Vitals Temp: 97.7 F (36.5 C) Temp Source: Oral Pulse Rate: (!) 56 Resp: 16 BP: (!) 107/59 Patient Position (if appropriate): Lying Oxygen Therapy SpO2: 97 % O2 Device: Room Air ADL: ADL Eating: Supervision/safety Where Assessed-Eating: Bed level Grooming: Minimal assistance Where Assessed-Grooming: Bed level Upper Body Bathing: Maximal assistance Where Assessed-Upper Body Bathing: Edge of bed Lower Body Bathing: Other (comment) (total +2 assist) Where Assessed-Lower Body Bathing: Bed level Upper Body  Dressing: Dependent Where Assessed-Upper Body Dressing: Edge of bed Lower Body Dressing: Other (Comment) (total +2 supine to sit) Where Assessed-Lower Body Dressing: Edge of bed, Bed level Toileting: Other (Comment) (total +2) Where Assessed-Toileting: Bed level Toilet Transfer: Other (comment) (total +2 with stedy) Toilet Transfer Method: Other (comment) Toilet Transfer Equipment: Bedside commode :     Therapy/Group: Individual Therapy  Brynden Thune A Louvenia Golomb 12/08/2019, 3:54 PM

## 2019-12-10 ENCOUNTER — Inpatient Hospital Stay (HOSPITAL_COMMUNITY): Payer: No Typology Code available for payment source

## 2019-12-10 ENCOUNTER — Inpatient Hospital Stay (HOSPITAL_COMMUNITY): Payer: No Typology Code available for payment source | Admitting: Occupational Therapy

## 2019-12-10 ENCOUNTER — Inpatient Hospital Stay (HOSPITAL_COMMUNITY): Payer: No Typology Code available for payment source | Admitting: Speech Pathology

## 2019-12-10 DIAGNOSIS — N179 Acute kidney failure, unspecified: Secondary | ICD-10-CM

## 2019-12-10 DIAGNOSIS — D696 Thrombocytopenia, unspecified: Secondary | ICD-10-CM

## 2019-12-10 DIAGNOSIS — I63511 Cerebral infarction due to unspecified occlusion or stenosis of right middle cerebral artery: Secondary | ICD-10-CM

## 2019-12-10 LAB — CBC WITH DIFFERENTIAL/PLATELET
Abs Immature Granulocytes: 0.04 10*3/uL (ref 0.00–0.07)
Basophils Absolute: 0 10*3/uL (ref 0.0–0.1)
Basophils Relative: 1 %
Eosinophils Absolute: 0.2 10*3/uL (ref 0.0–0.5)
Eosinophils Relative: 2 %
HCT: 36.2 % — ABNORMAL LOW (ref 39.0–52.0)
Hemoglobin: 12.2 g/dL — ABNORMAL LOW (ref 13.0–17.0)
Immature Granulocytes: 1 %
Lymphocytes Relative: 19 %
Lymphs Abs: 1.3 10*3/uL (ref 0.7–4.0)
MCH: 32.6 pg (ref 26.0–34.0)
MCHC: 33.7 g/dL (ref 30.0–36.0)
MCV: 96.8 fL (ref 80.0–100.0)
Monocytes Absolute: 0.5 10*3/uL (ref 0.1–1.0)
Monocytes Relative: 8 %
Neutro Abs: 4.5 10*3/uL (ref 1.7–7.7)
Neutrophils Relative %: 69 %
Platelets: 123 10*3/uL — ABNORMAL LOW (ref 150–400)
RBC: 3.74 MIL/uL — ABNORMAL LOW (ref 4.22–5.81)
RDW: 14.3 % (ref 11.5–15.5)
WBC: 6.5 10*3/uL (ref 4.0–10.5)
nRBC: 0 % (ref 0.0–0.2)

## 2019-12-10 LAB — BASIC METABOLIC PANEL
Anion gap: 7 (ref 5–15)
BUN: 27 mg/dL — ABNORMAL HIGH (ref 8–23)
CO2: 31 mmol/L (ref 22–32)
Calcium: 9.4 mg/dL (ref 8.9–10.3)
Chloride: 104 mmol/L (ref 98–111)
Creatinine, Ser: 1.38 mg/dL — ABNORMAL HIGH (ref 0.61–1.24)
GFR, Estimated: 49 mL/min — ABNORMAL LOW (ref >60)
Glucose, Bld: 129 mg/dL — ABNORMAL HIGH (ref 70–99)
Potassium: 4 mmol/L (ref 3.5–5.1)
Sodium: 142 mmol/L (ref 135–145)

## 2019-12-10 NOTE — Progress Notes (Signed)
Occupational Therapy Session Note  Patient Details  Name: Charles Sherman MRN: [id number] Date of Birth: 31-Dec-1945  Today's Date: 12/10/2019 OT Individual Time: 9753-0051 OT Individual Time Calculation (min): 53 min    Short Term Goals: Week 2:  OT Short Term Goal 1 (Week 2): Pt will completed UB dressing with min assist following hemi dressing techiques. OT Short Term Goal 2 (Week 2): Pt will complete LB bathing with mod assist sit to stand. OT Short Term Goal 3 (Week 2): Pt will complete LB dressing with mod assist for brief and pants only. OT Short Term Goal 4 (Week 2): Pt will scan left of midline with no more than min instructional cueing to locate selfcare items during bathing tasks.  Skilled Therapeutic Interventions/Progress Updates:    Pt completed supine to sit EOB with mod assist.  He then completed mod assist squat pivot transfer to the wheelchair on the right side.  Noted bladder incontinence on clothing and on bed linens, so worked on washing up these areas at the sink as well as donning new clothing.  Mod assist for standing to wash peri area and buttocks with max assist for donning new brief and pants.  While standing, he continues to need mod facilitation to maintain upright posture as he slowly falls down into flexion.  Once complete, had him work on functional mobility using three muskateers technique and total +2 (pt 50%) for approximately 22'.  Finished session with transfer back to the room and pt left sitting up in the tilt in space wheelchair at the end of the session.  Call button and phone in reach with safety belt in place.    Therapy Documentation Precautions:  Precautions Precautions: Fall Precaution Comments: left neglect, left hemiparesis, slight pusher to the left Restrictions Weight Bearing Restrictions: No  Pain: Pain Assessment Pain Scale: Faces Pain Score: 0-No pain ADL: See Care Tool Section for some details of mobility and  selfcare  Therapy/Group: Individual Therapy  Amarrah Meinhart OTR/L 12/10/2019, 3:57 PM

## 2019-12-10 NOTE — Progress Notes (Signed)
[facility] PHYSICAL MEDICINE & REHABILITATION PROGRESS NOTE   Subjective/Complaints: Patient seen laying in bed this morning.  He states he slept well overnight.  He states a good weekend.  He is wearing his orthoses.  ROS: Denies CP, SOB, N/V/D  Objective:   No results found. Recent Labs    12/10/19 0634  WBC 6.5  HGB 12.2*  HCT 36.2*  PLT 123*   Recent Labs    12/10/19 0634  NA 142  K 4.0  CL 104  CO2 31  GLUCOSE 129*  BUN 27*  CREATININE 1.38*  CALCIUM 9.4    Intake/Output Summary (Last 24 hours) at 12/10/2019 1458 Last data filed at 12/10/2019 1246 Gross per 24 hour  Intake 500 ml  Output --  Net 500 ml        Physical Exam: Vital Signs Blood pressure 102/69, pulse 71, temperature 98.2 F (36.8 C), temperature source Oral, resp. rate 20, height 5\' 11"  (1.803 m), weight 105 kg, SpO2 97 %. Constitutional: No distress . Vital signs reviewed. HENT: Normocephalic.  Atraumatic. Eyes: EOMI. No discharge. Cardiovascular: No JVD.  RRR. Respiratory: Normal effort.  No stridor.  Bilateral clear to auscultation. GI: Non-distended.  BS +. Skin: Warm and dry.  Intact. Psych: Normal mood.  Normal behavior. Musc: No edema in extremities.  No tenderness in extremities. Neuro: Alert Motor: LLE: Hip flexion 0/5, knee extension 2-/5, ankle dorsiflexion 0/5, Left plantar flexion 2-/5, unchanged Left UE 2-/5 elbow flex/ext, trace finger flexion, unchanged  Assessment/Plan: 1. Functional deficits secondary to Right ICA distribution infarcts  which require 3+ hours per day of interdisciplinary therapy in a comprehensive inpatient rehab setting.  Physiatrist is providing close team supervision and 24 hour management of active medical problems listed below.  Physiatrist and rehab team continue to assess barriers to discharge/monitor patient progress toward functional and medical goals  Care Tool:  Bathing    Body parts bathed by patient: Left arm, Chest, Abdomen,  Right upper leg, Left upper leg, Right lower leg, Face, Front perineal area   Body parts bathed by helper: Right arm, Buttocks, Left lower leg     Bathing assist Assist Level: Maximal Assistance - Patient 24 - 49% (sit to stand in the shower)     Upper Body Dressing/Undressing Upper body dressing   What is the patient wearing?: Pull over shirt    Upper body assist Assist Level: Moderate Assistance - Patient 50 - 74%    Lower Body Dressing/Undressing Lower body dressing      What is the patient wearing?: Incontinence brief, Pants     Lower body assist Assist for lower body dressing: Maximal Assistance - Patient 25 - 49%     Toileting Toileting    Toileting assist Assist for toileting: 2 Helpers (assist to hold the urinal while standing secondary to bladder urgency)     Transfers Chair/bed transfer  Transfers assist     Chair/bed transfer assist level: 2 Helpers     Locomotion Ambulation   Ambulation assist   Ambulation activity did not occur: Safety/medical concerns  Assist level: 2 helpers Assistive device: Other (comment) (3 Musketeer) Max distance: 4   Walk 10 feet activity   Assist     Assist level: 2 helpers Assistive device: Other (comment) (3 Musketeer)   Walk 50 feet activity   Assist    Assist level: 2 helpers Assistive device: Other (comment) (3 Musketeer)    Walk 150 feet activity   Assist  Walk 10 feet on uneven surface  activity   Assist Walk 10 feet on uneven surfaces activity did not occur: Safety/medical concerns         Wheelchair     Assist Will patient use wheelchair at discharge?: Yes Type of Wheelchair: Manual Wheelchair activity did not occur: Safety/medical concerns  Wheelchair assist level: Total Assistance - Patient < 25%      Wheelchair 50 feet with 2 turns activity    Assist        Assist Level: Total Assistance - Patient < 25%   Wheelchair 150 feet activity      Assist      Assist Level: Total Assistance - Patient < 25%   Blood pressure 102/69, pulse 71, temperature 98.2 F (36.8 C), temperature source Oral, resp. rate 20, height 5\' 11"  (1.803 m), weight 105 kg, SpO2 97 %.   Medical Problem List and Plan: 1.  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  Status post right ICA revascularization with stent assisted angioplasty 11/21/2019  Continue CIR  WHO/PRAFO nightly 2.  Antithrombotics: -DVT/anticoagulation: Lovenox             -antiplatelet therapy: Aspirin 81 mg daily and Brilinta 90 mg twice daily 3. Pain Management: Well controlled 4. Mood: Provide emotional support             -antipsychotic agents: N/A 5. Neuropsych: This patient is capable of making decisions on his own behalf. 6. Skin/Wound Care: Routine skin checks 7. Fluids/Electrolytes/Nutrition: Routine in and outs 8.  Right medial sphenoid wing meningioma.  Followed by neurosurgery.  Decadron protocol.  No current indications for surgical intervention at this time 9.  Hypertension.  Norvasc 10 mg daily, Zaroxolyn 2.5 mg 1/day on Monday, Lopressor 12.5 mg twice daily, Imdur 120 mg daily.  Vitals:   12/10/19 0427 12/10/19 1357  BP: 112/64 102/69  Pulse: 62 71  Resp: 18 20  Temp: 98.3 F (36.8 C) 98.2 F (36.8 C)  SpO2: 94% 97%   Slightly soft on 10/18, will consider decreasing medications if persistent 10.  Diastolic congestive heart failure.  metoprolol, Lasix 40 mg twice daily.  Monitor for any signs of fluid overload  Lasix decreased to 20mg  BID  Daily weights  Filed Weights   12/08/19 0423 12/09/19 0500 12/10/19 0500  Weight: 106.7 kg 105.5 kg 105 kg   Stable on 10/17 11.  Hyperlipidemia.  Lipitor 12.  History of COPD/ tobacco abuse.  Counseling.  Albuterol inhaler as needed 13.  GERD.  Protonix 14.  Post  stroke dysphagia.  Advanced to Dysphagia #3 thin liquids.  Follow-up speech therapy 15. Bradycardic:on BB monitor TID., D/C BB  , checked 10/8 EKG sinus brady rate 49 Vitals:   12/10/19 0427 12/10/19 1357  BP: 112/64 102/69  Pulse: 62 71  Resp: 18 20  Temp: 98.3 F (36.8 C) 98.2 F (36.8 C)  SpO2: 94% 97%   Controlled on 10/18 16.  Morbid obesity: Encourage weight loss 17.  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  AKI  Creatinine 1.38 on 10/18  Encourage fluids  Continue to monitor 19.  Thrombocytopenia  Platelets 123 on 10/18, trending down  Repeat labs later this week  LOS: 12 days A FACE TO FACE EVALUATION WAS PERFORMED  Lacharles Altschuler Lorie Phenix 12/10/2019, 2:58 PM

## 2019-12-10 NOTE — Progress Notes (Signed)
Speech Language Pathology Daily Session Note  Patient Details  Name: Charles Sherman MRN: [id number] Date of Birth: 16-Jul-1945  Today's Date: 12/10/2019 SLP Individual Time: 1015-1058 SLP Individual Time Calculation (min): 43 min  Short Term Goals: Week 2: SLP Short Term Goal 1 (Week 2): Pt will consume trials of regular textures X2, demonstrating efficient mastication and oral clearance with no more than Min A verbal cues for use of swallow strategies prior to advancement. SLP Short Term Goal 2 (Week 2): Pt will demonstrate ability to problem solve basic to mildly complex tasks with Min A verbal/visual cues. SLP Short Term Goal 3 (Week 2): Pt will detect functional errors with Mod A verbal/visual cues. SLP Short Term Goal 4 (Week 2): Pt will scan left visual field or attend to objects in left side of environment in 8/10 opportunities with Mod A multimodal cues. SLP Short Term Goal 5 (Week 2): Pt will selectively attend to functional tasks with Supervision A verbal and visual cues.  Skilled Therapeutic Interventions: Pt was seen for skilled ST targeting cognitive goals. SLP facilitated session with a basic grocery ad search task, during which he required Max A cueing for visual scanning and problem solving and visual scanning. Either finger tracing or a visual anchor was required for pt to locate items on left side of page, Mod A for locating 1 item at midline. Moderate verbal and visual cues could be faded to Min for visual scanning when reading a basic grocery list containing 6 items. Pt also very fatigued this session, requiring constant auditory stimulation and prompts to remain alert and participate. Pt left sitting in bed with alarm set and needs within reach. Continue per current plan of care.          Pain Pain Assessment Pain Scale: 0-10 Pain Score: 0-No pain  Therapy/Group: Individual Therapy  Charles Sherman 12/10/2019, 12:08 PM

## 2019-12-10 NOTE — Progress Notes (Signed)
Physical Therapy Session Note  Patient Details  Name: Charles Sherman MRN: [id number] Date of Birth: Jun 29, 1945  Today's Date: 12/10/2019 PT Individual Time: 3361-2244 PT Individual Time Calculation (min): 40 min   Short Term Goals: Week 2:  PT Short Term Goal 1 (Week 2): Pt will consistently perform supine<>sit with mod assist PT Short Term Goal 2 (Week 2): Pt will consistently perform sit<>stand with mod assist of 1 PT Short Term Goal 3 (Week 2): Pt will consistently perform bed<>chair transfers with mod assist of 1 PT Short Term Goal 4 (Week 2): Pt will ambulate at least 57ft using LRAD with mod assist of 1 (+2 w/c follow if needed)  Skilled Therapeutic Interventions/Progress Updates:    Patient up in tilt in space w/c.  Pushed in chair to dayroom.  Performed sit to stand mod A+2 for safety and ambulated to nu step x 10' mod A +2 three muskateers technique, max cues for R lateral weight shift, L foot placement and activation in stance.  PAtient on Nu Step for bilateral LE's x 5 minutes level 1 mod A for L hip neutral and to keep L foot from falling off and occasionally for initiation.  PAtient stand pivot to w/c max A +2 landing in chair uncontrolled.  At wall rail in dayroom, sit to stand and ambulated with +1 A (+2 for w/c following) x 6' with cues for R lateral weight shift and assist for L knee support in stance.  Assisted to room in w/c.  Sit to stand to sink for changing brief.  Sit to stand x 5 to sink cues for using visual input from mirror for midline positioning and mod cues for L LE extension throughout stance.  Patient transferred to bed via stand pivot mod to max A cues for controlled lowering.  Sit to supine mod A.  Left with needs in reach and bed alarm activated.   Therapy Documentation Precautions:  Precautions Precautions: Fall Precaution Comments: left neglect, left hemiparesis, slight pusher to the left Restrictions Weight Bearing Restrictions: No Pain: Pain  Assessment Faces Pain Scale: No hurt    Therapy/Group: Individual Therapy  Reginia Naas  Mentor, PT 12/10/2019, 5:59 PM

## 2019-12-10 NOTE — Progress Notes (Signed)
Physical Therapy Session Note  Patient Details  Name: Charles Sherman MRN: [id number] Date of Birth: 10-12-1945  Today's Date: 12/10/2019 PT Individual Time: 0901-1000 PT Individual Time Calculation (min): 59 min   Short Term Goals: Week 2:  PT Short Term Goal 1 (Week 2): Pt will consistently perform supine<>sit with mod assist PT Short Term Goal 2 (Week 2): Pt will consistently perform sit<>stand with mod assist of 1 PT Short Term Goal 3 (Week 2): Pt will consistently perform bed<>chair transfers with mod assist of 1 PT Short Term Goal 4 (Week 2): Pt will ambulate at least 73ft using LRAD with mod assist of 1 (+2 w/c follow if needed)  Skilled Therapeutic Interventions/Progress Updates:    Patient received supine in bed agreeable to PT. He denies pain. MaxA provided to don pants, socks and TED hose supine in bed. He is demonstrating greater ease rolling to the L requiring verbal cuing. ModA still needed to roll R. ModA for supine > sitting edge of bed and ModA x2 stand pivot transfer to wc leading R. PT propelling patient in wc to therapy gym for time management and energy conservation. ModA x2 stand pivot to therapy mat. Patient able to maintain static sitting balance edge of mat with midline orientation for up to 2 mins prior to fatiguing and resuming slight L lateral lean. No pushing demonstrated, but L lean present with fatigue. Patient able to complete STS with ModA x2 and attempted gait with 3-musketeers assist x2. Increased L LE flexor tone noted when patient would attempt to put weight on LE to advance R LE. Due to assist needed for postural control PT unable to provide assist necessary to prevent L flexor tone as well. He was able to ambulate a total of 4 steps with MaxA x2 and assist with placing L LE. Patient with episode of bladder incontinence requiring patient to return to bed via stand pivot, ModA x2. ModA provided for sit > supine transition. ModA for pericare and MaxA for  donning pants supine in bed. Patient requesting to remain in bed. At end of session, NT present asking if PT had seen patients phone stating that the patient noted his phone was missing. This PT and rehab tech present did not observe patients phone in his room this session. Bed alarm on, call light within reach. Patient able to demonstrate appropriate use of call light as well.   Therapy Documentation Precautions:  Precautions Precautions: Fall Precaution Comments: left neglect, left hemiparesis, slight pusher to the left Restrictions Weight Bearing Restrictions: No    Therapy/Group: Individual Therapy  Karoline Caldwell, PT, DPT, CBIS 12/10/2019, 7:42 AM

## 2019-12-11 ENCOUNTER — Inpatient Hospital Stay (HOSPITAL_COMMUNITY): Payer: No Typology Code available for payment source | Admitting: Occupational Therapy

## 2019-12-11 ENCOUNTER — Inpatient Hospital Stay (HOSPITAL_COMMUNITY): Payer: No Typology Code available for payment source | Admitting: Physical Therapy

## 2019-12-11 ENCOUNTER — Inpatient Hospital Stay (HOSPITAL_COMMUNITY): Payer: No Typology Code available for payment source | Admitting: Speech Pathology

## 2019-12-11 NOTE — Progress Notes (Signed)
Speech Language Pathology Daily Session Note  Patient Details  Name: Charles Sherman MRN: [id number] Date of Birth: 01-06-1946  Today's Date: 12/11/2019 SLP Individual Time: 1102-1117 SLP Individual Time Calculation (min): 43 min  Short Term Goals: Week 2: SLP Short Term Goal 1 (Week 2): Pt will consume trials of regular textures X2, demonstrating efficient mastication and oral clearance with no more than Min A verbal cues for use of swallow strategies prior to advancement. SLP Short Term Goal 2 (Week 2): Pt will demonstrate ability to problem solve basic to mildly complex tasks with Min A verbal/visual cues. SLP Short Term Goal 3 (Week 2): Pt will detect functional errors with Mod A verbal/visual cues. SLP Short Term Goal 4 (Week 2): Pt will scan left visual field or attend to objects in left side of environment in 8/10 opportunities with Mod A multimodal cues. SLP Short Term Goal 5 (Week 2): Pt will selectively attend to functional tasks with Supervision A verbal and visual cues.  Skilled Therapeutic Interventions: Pt was seen for skilled ST targeting cognitive goals. SLP facilitated session with a trail making task, during which pt required Moderate verbal cues for visual scanning (left inattention) to connect #s 1-20 on pen and paper, Min A for basic problem solving within task. SLP further facilitated session with novel semi-complex card task, during which he required increased Mod-Max A for problem solving, error awareness, and visual scanning. He also seemed to become disinterested and somewhat limited by discomfort of sitting in his wheelchair; encouragement and concrete time goal set to encourage continued participation. Pt left sitting in chair with alarm set and needs within reach. Continue per current plan of care.          Pain Pain Assessment Pain Scale: Faces Pain Score: 0-No pain Faces Pain Scale: Hurts a little bit Pain Type: Acute pain Pain Location: Buttocks Pain  Descriptors / Indicators: Discomfort Pain Onset: On-going (with sitting in chair) Pain Intervention(s): Repositioned;Distraction Multiple Pain Sites: No  Therapy/Group: Individual Therapy  Arbutus Leas 12/11/2019, 2:59 PM

## 2019-12-11 NOTE — Progress Notes (Signed)
Physical Therapy Session Note  Patient Details  Name: Charles Sherman MRN: [id number] Date of Birth: 1946-01-08  Today's Date: 12/11/2019 PT Individual Time: 7948-0165 PT Individual Time Calculation (min): 73 min   Short Term Goals: Week 2:  PT Short Term Goal 1 (Week 2): Pt will consistently perform supine<>sit with mod assist PT Short Term Goal 2 (Week 2): Pt will consistently perform sit<>stand with mod assist of 1 PT Short Term Goal 3 (Week 2): Pt will consistently perform bed<>chair transfers with mod assist of 1 PT Short Term Goal 4 (Week 2): Pt will ambulate at least 56ft using LRAD with mod assist of 1 (+2 w/c follow if needed)  Skilled Therapeutic Interventions/Progress Updates:    Patient received supine in bed, agreeable to PT. He denies pain. PT retrieving 20x18 standard back wc to trial during therapy session. He was able to come to sitting edge of bed with ModA x1. Stand pivot to wc MaxA x1 with standby assist from rehab tech. He was able to maintain appropriate sitting posture in standard wc without evidence of sliding or increased pushing. PT propelling patient to therapy gym for time management and energy conservation. He was able to transfer to therapy mat via stand pivot with ModA x2. Static sitting balance has improved significantly as he is able to remain sitting up without evidence of pushing for >5 mins at a time. Dynamic reaching task completed having patient reach outside BOS. Min verbal cuing needed to attend to visual stimuli on L side. Mirror used for visual feedback for returning to midline posture after reaching. STS x4 with MinA-ModA x2 and mirror for visual feedback. Increasing ability to maintain weight bearing through L LE and achieve L knee extension in stance. Minimal pushing with R LE in standing noted. Patient does fatigue after ~45s standing and requires extended seated rest break prior to next therapeutic task. STS + marching with ModA x2 for postural  support. Patient with episode of urinary incontinence in standing. MaxA x1 to transfer back to wc via stand pivot. MaxA x1 for stand pivot back to bed, MinA for sit > supine transition for L LE management. ModA provided for peri care at bed level. Bed alarm on, call light within reach. Patient will benefit from timed toileting schedule to ensure optimal use of therapy time moving forward.   Therapy Documentation Precautions:  Precautions Precautions: Fall Precaution Comments: left neglect, left hemiparesis, slight pusher to the left Restrictions Weight Bearing Restrictions: No    Therapy/Group: Individual Therapy  Karoline Caldwell, PT, DPT, CBIS 12/11/2019, 7:36 AM

## 2019-12-11 NOTE — Progress Notes (Signed)
[place] PHYSICAL MEDICINE & REHABILITATION PROGRESS NOTE   Subjective/Complaints:  No pain c/os, finished breakfast , discussed care team in am as well as LOS  ROS: Denies CP, SOB, N/V/D  Objective:   No results found. Recent Labs    12/10/19 0634  WBC 6.5  HGB 12.2*  HCT 36.2*  PLT 123*   Recent Labs    12/10/19 0634  NA 142  K 4.0  CL 104  CO2 31  GLUCOSE 129*  BUN 27*  CREATININE 1.38*  CALCIUM 9.4    Intake/Output Summary (Last 24 hours) at 12/11/2019 0906 Last data filed at 12/11/2019 0827 Gross per 24 hour  Intake 740 ml  Output --  Net 740 ml        Physical Exam: Vital Signs Blood pressure (!) 118/55, pulse (!) 56, temperature 98.7 F (37.1 C), temperature source Oral, resp. rate 17, height 5\' 11"  (1.803 m), weight 101.9 kg, SpO2 99 %.  General: No acute distress Mood and affect are appropriate Heart: Regular rate and rhythm no rubs murmurs or extra sounds Lungs: Clear to auscultation, breathing unlabored, no rales or wheezes Abdomen: Positive bowel sounds, soft nontender to palpation, nondistended Extremities: No clubbing, cyanosis, or edema Skin: No evidence of breakdown, no evidence of rash   Motor: LLE: Hip flexion 0/5, knee extension 2-/5, ankle dorsiflexion 0/5, Left plantar flexion 2-/5, unchanged Left UE 2-/5 elbow flex/ext, trace finger flexion, unchanged  Assessment/Plan: 1. Functional deficits secondary to Right ICA distribution infarcts  which require 3+ hours per day of interdisciplinary therapy in a comprehensive inpatient rehab setting.  Physiatrist is providing close team supervision and 24 hour management of active medical problems listed below.  Physiatrist and rehab team continue to assess barriers to discharge/monitor patient progress toward functional and medical goals  Care Tool:  Bathing    Body parts bathed by patient: Front perineal area   Body parts bathed by helper: Front perineal area, Buttocks      Bathing assist Assist Level: Maximal Assistance - Patient 24 - 49%     Upper Body Dressing/Undressing Upper body dressing   What is the patient wearing?: Pull over shirt    Upper body assist Assist Level: Moderate Assistance - Patient 50 - 74%    Lower Body Dressing/Undressing Lower body dressing      What is the patient wearing?: Incontinence brief, Pants     Lower body assist Assist for lower body dressing: Maximal Assistance - Patient 25 - 49%     Toileting Toileting    Toileting assist Assist for toileting: 2 Helpers     Transfers Chair/bed transfer  Transfers assist     Chair/bed transfer assist level: 2 Helpers     Locomotion Ambulation   Ambulation assist   Ambulation activity did not occur: Safety/medical concerns  Assist level: 2 helpers Assistive device: Other (comment) (3 muskateer vs wall rail) Max distance: 10'   Walk 10 feet activity   Assist     Assist level: 2 helpers Assistive device: Other (comment) (3 muskateer vs wall rail)   Walk 50 feet activity   Assist    Assist level: 2 helpers Assistive device: Other (comment) (3 Musketeer)    Walk 150 feet activity   Assist           Walk 10 feet on uneven surface  activity   Assist Walk 10 feet on uneven surfaces activity did not occur: Safety/medical concerns         Wheelchair  Assist Will patient use wheelchair at discharge?: Yes Type of Wheelchair: Manual Wheelchair activity did not occur: Safety/medical concerns  Wheelchair assist level: Total Assistance - Patient < 25%      Wheelchair 50 feet with 2 turns activity    Assist        Assist Level: Total Assistance - Patient < 25%   Wheelchair 150 feet activity     Assist      Assist Level: Total Assistance - Patient < 25%   Blood pressure (!) 118/55, pulse (!) 56, temperature 98.7 F (37.1 C), temperature source Oral, resp. rate 17, height 5\' 11"  (1.803 m), weight 101.9 kg, SpO2  99 %.   Medical Problem List and Plan: 1.  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  Status post right ICA revascularization with stent assisted angioplasty 11/21/2019  Continue CIR, team conf in am   WHO/PRAFO nightly 2.  Antithrombotics: -DVT/anticoagulation: Lovenox             -antiplatelet therapy: Aspirin 81 mg daily and Brilinta 90 mg twice daily 3. Pain Management: Well controlled 4. Mood: Provide emotional support             -antipsychotic agents: N/A 5. Neuropsych: This patient is capable of making decisions on his own behalf. 6. Skin/Wound Care: Routine skin checks 7. Fluids/Electrolytes/Nutrition: Routine in and outs 8.  Right medial sphenoid wing meningioma.  Followed by neurosurgery.  Decadron protocol.  No current indications for surgical intervention at this time 9.  Hypertension.  Norvasc 10 mg daily, Zaroxolyn 2.5 mg 1/day on Monday, Lopressor 12.5 mg twice daily, Imdur 120 mg daily.  Vitals:   12/10/19 1934 12/11/19 0400  BP: (!) 116/50 (!) 118/55  Pulse: 64 (!) 56  Resp: 16 17  Temp: 98.5 F (36.9 C) 98.7 F (37.1 C)  SpO2: 100% 99%   Slightly soft on 10/18, will consider decreasing medications if persistent 10.  Diastolic congestive heart failure.  metoprolol, Lasix 40 mg twice daily.  Monitor for any signs of fluid overload  Lasix decreased to 20mg  BID  Daily weights  Filed Weights   12/09/19 0500 12/10/19 0500 12/11/19 0500  Weight: 105.5 kg 105 kg 101.9 kg   Stable on 10/19 11.  Hyperlipidemia.  Lipitor 12.  History of COPD/ tobacco abuse.  Counseling.  Albuterol inhaler as needed 13.  GERD.  Protonix 14.  Post stroke dysphagia.  Advanced to Dysphagia #3 thin liquids.  Follow-up speech therapy 15. Bradycardic:on BB monitor TID., D/C BB  , checked 10/8 EKG sinus brady rate 49 Vitals:   12/10/19 1934  12/11/19 0400  BP: (!) 116/50 (!) 118/55  Pulse: 64 (!) 56  Resp: 16 17  Temp: 98.5 F (36.9 C) 98.7 F (37.1 C)  SpO2: 100% 99%   Controlled on 10/19 16.  Morbid obesity: Encourage weight loss 17.  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  AKI  Creatinine 1.38 on 10/18  Encourage fluids  Continue to monitor 19.  Thrombocytopenia  Platelets 123 on 10/18, trending down  Repeat labs later this week  LOS: 13 days A FACE TO [place] 12/11/2019, 9:06 AM

## 2019-12-11 NOTE — Progress Notes (Signed)
Occupational Therapy Session Note  Patient Details  Name: Charles Sherman MRN: [id number] Date of Birth: 20-Jun-1945  Today's Date: 12/11/2019 OT Individual Time: 1052-1200 OT Individual Time Calculation (min): 68 min    Short Term Goals: Week 2:  OT Short Term Goal 1 (Week 2): Pt will completed UB dressing with min assist following hemi dressing techiques. OT Short Term Goal 2 (Week 2): Pt will complete LB bathing with mod assist sit to stand. OT Short Term Goal 3 (Week 2): Pt will complete LB dressing with mod assist for brief and pants only. OT Short Term Goal 4 (Week 2): Pt will scan left of midline with no more than min instructional cueing to locate selfcare items during bathing tasks.  Skilled Therapeutic Interventions/Progress Updates:    Pt completed supine to sit EOB with mod assist to the right side.  He then completed squat pivot transfer to the wheelchair at mod assist to the right as well.  He was taken into the shower where he completed stand pivot transfer to the tub bench with max assist to the left and then worked on undressing for shower.  He doffed his pullover shirt with supervision and then doffed his brief, pants, TEDS, and gripper socks with max assist.  He was able to complete bathing with overall min assist for UB and mod assist for LB sit to stand.  Increased lean to the left throughout bathing with inability to achieve or maintain midline with dynamic activity.  Max hand over hand needed for using the LUE to wash the right arm.  Next, he transferred back to the wheelchair with mod assist to the right in order to work on dressing at the sink.  Mod assist for donning pullover shirt with mod assist for donning brief and pants.  Next, therapist assisted with donning TEDS at total assist and then he donned his gripper socks with mod assist.  Finished session with completion of oral hygiene with setup of items needed and toothbrush with toothpaste secondary to decreased  time.  Pt left in the wheelchair with the call button and phone in reach and half lap tray in place to support the LUE.  Safety belt in place as well.     Therapy Documentation Precautions:  Precautions Precautions: Fall Precaution Comments: left neglect, left hemiparesis, slight pusher to the left Restrictions Weight Bearing Restrictions: No  Pain: Pain Assessment Pain Scale: Faces Pain Score: 0-No pain ADL: See Care Tool Section for some details of mobility and selfcare  Therapy/Group: Individual Therapy  Joeziah Voit OTR/L 12/11/2019, 12:28 PM

## 2019-12-12 ENCOUNTER — Inpatient Hospital Stay (HOSPITAL_COMMUNITY): Payer: No Typology Code available for payment source

## 2019-12-12 ENCOUNTER — Inpatient Hospital Stay (HOSPITAL_COMMUNITY): Payer: No Typology Code available for payment source | Admitting: Occupational Therapy

## 2019-12-12 MED ORDER — BLOOD PRESSURE CONTROL BOOK
Freq: Once | Status: AC
  Filled 2019-12-12: qty 1

## 2019-12-12 MED ORDER — SULFACETAMIDE SODIUM 10 % OP SOLN
1.0000 [drp] | Freq: Four times a day (QID) | OPHTHALMIC | Status: DC
  Administered 2019-12-12 – 2019-12-19 (×26): 1 [drp] via OPHTHALMIC
  Filled 2019-12-12: qty 15

## 2019-12-12 MED ORDER — PREDNISOLONE ACETATE 1 % OP SUSP
1.0000 [drp] | Freq: Four times a day (QID) | OPHTHALMIC | Status: DC
  Administered 2019-12-12 – 2019-12-19 (×25): 1 [drp] via OPHTHALMIC
  Filled 2019-12-12: qty 5

## 2019-12-12 MED ORDER — DEXAMETHASONE 0.5 MG PO TABS
0.5000 mg | ORAL_TABLET | Freq: Two times a day (BID) | ORAL | Status: DC
  Administered 2019-12-12 – 2019-12-20 (×16): 0.5 mg via ORAL
  Filled 2019-12-12 (×17): qty 1

## 2019-12-12 NOTE — Progress Notes (Signed)
Speech Language Pathology Daily Session Note  Patient Details  Name: Charles Sherman MRN: [id number] Date of Birth: [birth year]  Today's Date: 12/12/2019 SLP Individual Time:  9753 - 0051     Short Term Goals: Week 2: SLP Short Term Goal 1 (Week 2): Pt will consume trials of regular textures X2, demonstrating efficient mastication and oral clearance with no more than Min A verbal cues for use of swallow strategies prior to advancement. SLP Short Term Goal 2 (Week 2): Pt will demonstrate ability to problem solve basic to mildly complex tasks with Min A verbal/visual cues. SLP Short Term Goal 3 (Week 2): Pt will detect functional errors with Mod A verbal/visual cues. SLP Short Term Goal 4 (Week 2): Pt will scan left visual field or attend to objects in left side of environment in 8/10 opportunities with Mod A multimodal cues. SLP Short Term Goal 5 (Week 2): Pt will selectively attend to functional tasks with Supervision A verbal and visual cues.  Skilled Therapeutic Interventions:Skilled ST services focused on cognitive skills. SLP facilitated mildly complex problem solving with familiar card task from yesterday's session, however pt required max A verbal cues and when broken down step by step required mod A verbal cues. SLP adjusted game to more basic problem solving, pt initially required max A verbal cues fading to min A verbal cues (3/4th through task) once SLP removed her cards due to visual confusion. Pt was able to scan just left of midline with mod A fade to min A verbal cues and far left with max A visual/verbal cues fade mod A verbal cues. Pt was left in room with call bell within reach and chair alarm set. SLP recommends to continue skilled services.     Pain    Therapy/Group: Individual Therapy  Tristine Langi  [facility] 12/12/2019, 7:42 AM

## 2019-12-12 NOTE — Progress Notes (Signed)
Occupational Therapy Session Note  Patient Details  Name: Charles Sherman MRN: [id number] Date of Birth: October 26, 1945  Today's Date: 12/12/2019 OT Individual Time: 4098-1191 OT Individual Time Calculation (min): 44 min    Short Term Goals: Week 2:  OT Short Term Goal 1 (Week 2): Pt will completed UB dressing with min assist following hemi dressing techiques. OT Short Term Goal 2 (Week 2): Pt will complete LB bathing with mod assist sit to stand. OT Short Term Goal 3 (Week 2): Pt will complete LB dressing with mod assist for brief and pants only. OT Short Term Goal 4 (Week 2): Pt will scan left of midline with no more than min instructional cueing to locate selfcare items during bathing tasks.  Skilled Therapeutic Interventions/Progress Updates:    Pt completed transfer from supine to sit EOB with min assist.  He then completed squat pivot transfer to the wheelchair with mod assist to the right side.  Therapist spent first minutes of session adjusting the wheelchair to allow for greater fit as pt was sliding forward out of it per report.  He was positioned more upright in the chair with pillow behind him to promote upright trunk.  Half lap tray was placed on the left side as well for support of the LUE.  Had him work on [employer] shoulder flexion with therapist assist for 1 set of 10 reps. Also had him completed elbow flexion/extension as well as digit flexion/ extension.  He demonstrates Brunnstrum stage III movement in the arm with stage III-IV movement in the hand.  Finished session with pt resting in the wheelchair in preparation for SLP session coming up next.  Call button and phone in reach on the right side as well as safety belt in place.    Therapy Documentation Precautions:  Precautions Precautions: Fall Precaution Comments: left neglect, left hemiparesis, slight pusher to the left Restrictions Weight Bearing Restrictions: No   Pain: Pain Assessment Pain Scale: Faces Pain Score:  0-No pain ADL: See Care Tool Section for some details of mobility and selfcare  Therapy/Group: Individual Therapy  Elisa Kutner OTR/L 12/12/2019, 3:55 PM

## 2019-12-12 NOTE — Progress Notes (Signed)
Speech Language Pathology Weekly Progress and Session Note  Patient Details  Name: Charles Sherman MRN: [id number] Date of Birth: 12-Jun-1945  Beginning of progress report period: December 06, 2019 End of progress report period: December 13, 2019  Today's Date: 12/13/2019 SLP Individual Time: 1400-1456 SLP Individual Time Calculation (min): 56 min  Short Term Goals: Week 2: SLP Short Term Goal 1 (Week 2): Pt will consume trials of regular textures X2, demonstrating efficient mastication and oral clearance with no more than Min A verbal cues for use of swallow strategies prior to advancement. SLP Short Term Goal 1 - Progress (Week 2): Met SLP Short Term Goal 2 (Week 2): Pt will demonstrate ability to problem solve basic to mildly complex tasks with Min A verbal/visual cues. SLP Short Term Goal 2 - Progress (Week 2): Progressing toward goal SLP Short Term Goal 3 (Week 2): Pt will detect functional errors with Mod A verbal/visual cues. SLP Short Term Goal 3 - Progress (Week 2): Met SLP Short Term Goal 4 (Week 2): Pt will scan left visual field or attend to objects in left side of environment in 8/10 opportunities with Mod A multimodal cues. SLP Short Term Goal 4 - Progress (Week 2): Progressing toward goal SLP Short Term Goal 5 (Week 2): Pt will selectively attend to functional tasks with Supervision A verbal and visual cues. SLP Short Term Goal 5 - Progress (Week 2): Progressing toward goal    New Short Term Goals: Week 3: SLP Short Term Goal 1 (Week 3): Pt will consume current regulat texture thin liquid diet Mod I for use of swallow strategies for oral clearance of left pocketing to increase independence with strategies and work toward decreasing supervision requirement during meals. SLP Short Term Goal 2 (Week 3): Pt will demonstrate ability to problem solve mildly complex tasks with Min A verbal/visual cues. SLP Short Term Goal 3 (Week 3): Pt will detect functional errors with Min A  verbal/visual cues. SLP Short Term Goal 4 (Week 3): Pt will scan left visual field or attend to objects in left side of environment in 5/10 opportunities with Mod A multimodal cues. SLP Short Term Goal 5 (Week 3): Pt will selectively attend to functional tasks with Supervision A verbal and visual cues.  Weekly Progress Updates: Pt has made slow gains and met 2 out of 5 short term goals this reporting period. Pt is currently Mod assist for basic to mildly complex tasks due to cognitive impairments impacting his attention, problem solving, and awareness. He is also severely impacted by left neglect and requires consistent Mod-Max cueing for attention to midline and left visual field throughout tasks. Pt is consuming upgraded regular texture diet with thin liquids with very minimal overt s/sx aspiration and Supervision A for use of swallow strategies to clear trace left buccal pocketing and minimize left anterior loss. Most cues during meals are for attention and locating items on left of plate/tray. Pt education is ongoing; no family has been present for ST sessions to date. Pt would continue to benefit from skilled ST while inpatient in order to maximize functional independence and reduce burden of care prior to discharge. Anticipate that pt will need 24/7 supervision at discharge in addition to [place] follow up at next level of care.       Intensity: Minumum of 1-2 x/day, 30 to 90 minutes Frequency: 3 to 5 out of 7 days Duration/Length of Stay: 12/25/19 Treatment/Interventions: Cognitive remediation/compensation;Cueing hierarchy;Functional tasks;Patient/family education;Dysphagia/aspiration precaution training;Internal/external aids   Daily Session  Skilled Therapeutic Interventions: Pt was seen for skilled ST targeting cognitive goals. SLP facilitated session with a semi-complex monthly calendar/scheduling task, which also involved some sentence level reading and alternating between reading worksheet  and documenting appointments on calendar. Pt required overall Mod A for visual scanning and use of pointer and blank piece of paper to assist with visual scanning and reducing visual distractions when reading at the sentence level. Mod A verbal and visual cues also required for visual scanning to left outside of reading task. Overall Mod A verbal cues required for problem solving and Min A for reasoning throughout task as well. Pt with slight improvement in ability to sustain attention to task today, although it was a very quiet controlled environment. Pt left sitting in chair with alarm set and needs within reach. Continue per current plan of care.            Pain Pain Assessment Pain Scale: 0-10 Pain Score: 0-No pain  Therapy/Group: Individual Therapy  Arbutus Leas 12/13/2019, 3:00 PM

## 2019-12-12 NOTE — Patient Care Conference (Signed)
Inpatient RehabilitationTeam Conference and Plan of Care Update Date: 12/12/2019   Time: 10:43 AM    Patient Name: Charles Sherman      Medical Record Number: [id number]  Date of Birth: 10-01-1945 Sex: Male         Room/Bed: 4W11C/4W11C-01 Payor Info: Payor: MEDICARE / Plan: MEDICARE PART A / Product Type: *No Product type* /    Admit Date/Time:  11/28/2019  4:03 PM  Primary Diagnosis:  Cerebral thrombosis with cerebral infarction [facility])  Hospital Problems: Principal Problem:   Cerebral thrombosis with cerebral infarction Active Problems:   Right middle cerebral artery stroke (HCC)   Hemiparesis affecting left side as late effect of stroke ([place])   Morbid obesity ([place])   Dysphagia, post-stroke   Chronic diastolic congestive heart failure ([place])   Essential hypertension   Thrombocytopenia ([place])   AKI (acute kidney injury) [facility])    Expected Discharge Date: Expected Discharge Date: 12/25/19  Team Members Present: Physician leading conference: Dr. Alysia Penna Care Coodinator Present: Dorien Chihuahua, RN, BSN, CRRN;Christina [place], [place] Nurse Present: Annita Brod, LPN PT Present: Stacy Gardner, PT OT Present: Clyda Greener, OT SLP Present: Jettie Booze, CF-SLP PPS Coordinator present : Gunnar Fusi, Novella Olive, PT     Current Status/Progress Goal Weekly Team Focus  Bowel/Bladder   Patient incontinent of bowel and bladder  obtain continence of bowel and bladder  Q2 hour toileting and Prn toileting to decrease incontinent incidences   Swallow/Nutrition/ Hydration   Regular textures, thin liquid, Supervision A swallow strategies, full supervision due to left inattention and attention         ADL's   Min assist for UB bathing with mod assist for UB dressing.  He needs mod to max assist for LB bathing and dressing.  Left neglect still present with decreased sustained attention to tasks. LUE function improving currrently Brunnstrum stage III in the hand and arm.   Max hand over hand assist is needed for bathing tasks however.  Squat pivot transfers to the right at mod assist.  Stand pivots still max assist.         Mobility   MinA-ModA bed mobility, MinA x2 STS, MaxA x1 stand pivot, TotalA in TIS (trialing standard wc this week), SPV/CGA for sitting balance  grossly MinA-ModA  dynamic sitting balance, wc mobility, gait, transfers, L attention   Communication             Safety/Cognition/ Behavioral Observations  Min-Mod, slow progress this week, severely impacted by left inattention and seems less motivated this week, lethargic  Supervision  initiation, selective attention, functional problem solving and error awareness, visual scanning   Pain   Patient denies pain  pain level  is  less than 3 with or without activity  Q4hr and PRN pai assessment   Skin   No skin issues noted  prevent further skin breakdown  Qshift and PRN skin assessment     Discharge Planning:  Goal to d/c home, pending patient level of care. Spouse, sister, grandson, family/church members able to provide assistance. 2 steps to enter   Team Discussion: Hypotension noted; MD adjusting meds, and monitoring bradycardia. Eyes better with eye gtts and edema decreased. Constipation addressed and remains incontinent of bladder. Note trace left UE movement but requires hand over hand assit. Progress impaired by left neglect, right head turn, poor attention, left bias. Requires constant cues for tasks, and extra time. Requires mirror for postural control and cues to not push.   Patient on  target to meet rehab goals: yes, currently min assist for UB care and mod assist for dressing. Mod assist for LB bathing and Max assist for LB dressing. Max assist for toileting with min-mod assist goals set. W/C level goal for discharge; no ambulation goal.  *See Care Plan and progress notes for long and short-term goals.   Revisions to Treatment Plan:  Trial standard wheelchair instead of tilt in  space chair.  Teaching Needs: Transfers, toileting, medications, etc.   Current Barriers to Discharge: Decreased caregiver support, Home enviroment access/layout, Incontinence, Weight and Behavior  Possible Resolutions to Barriers: Family education     Medical Summary Current Status: Patient still on Decadron we will start to wean, blood pressure is a little soft but overall in range, low platelet count continue to monitor closely  Barriers to Discharge: Medical stability   Possible Resolutions to Barriers/Weekly Focus: May need to discontinue enoxaparin if platelets drop less than 100,000   Continued Need for Acute Rehabilitation Level of Care: The patient requires daily medical management by a physician with specialized training in physical medicine and rehabilitation for the following reasons: Direction of a multidisciplinary physical rehabilitation program to maximize functional independence : Yes Medical management of patient stability for increased activity during participation in an intensive rehabilitation regime.: Yes Analysis of laboratory values and/or radiology reports with any subsequent need for medication adjustment and/or medical intervention. : Yes   I attest that I was present, lead the team conference, and concur with the assessment and plan of the team.   Dorien Chihuahua B 12/12/2019, 2:46 PM

## 2019-12-12 NOTE — Progress Notes (Signed)
Physical Therapy Session Note  Patient Details  Name: Charles Sherman MRN: [id number] Date of Birth: 1945-12-01  Today's Date: 12/12/2019 PT Individual Time: 0800-0916 PT Individual Time Calculation (min): 76 min   Short Term Goals: Week 2:  PT Short Term Goal 1 (Week 2): Pt will consistently perform supine<>sit with mod assist PT Short Term Goal 2 (Week 2): Pt will consistently perform sit<>stand with mod assist of 1 PT Short Term Goal 3 (Week 2): Pt will consistently perform bed<>chair transfers with mod assist of 1 PT Short Term Goal 4 (Week 2): Pt will ambulate at least 63ft using LRAD with mod assist of 1 (+2 w/c follow if needed)  Skilled Therapeutic Interventions/Progress Updates:    Patient received supine in bed agreeable to PT. He denies pain at this time. Patient able to roll B with CGA and use of bed rails to don pants. ModA for lower body dressing bed level. MinA provided for supine > sitting edge of bed. Slight L lateral lean still apparent with initial sitting, but with verbal cue patient able to correct and maintain upright posture. ModA squat pivot leading R into standard wc. He is able to adjust posture in wc with MinA. Patient initiating wc mobility x25 ft using B LE and R UE. Due to patients L neglect, he requires up to [place] for steering to avoid obstacles. Max verbal/tactile cuing to engage L LE in wc mobility as well. He may benefit from use of shoes to provide greater friction for improved efficiency in wc. Patient transferring to therapy mat via squat pivot with ModA leading R. MaxiSky walking harness donned in sitting. He is able to complete STS with ModA x2 and mirror for visual feedback on posture. He ambulated 3x66ft with MaxiSky for fall precautions, RW and modified L hand grip. MaxA provided from PT for adequate weight shifts R and ModA for L foot placement. He continues to display increased L flexor tone impairing his ability to achieve full L knee extension in  stance. Extended seated rest break needed between bouts of gait. Improving postural control noted with consistent redirection to look at mirror for postural feedback. Patient returning to room in wc, seatbelt alarm on, call light within reach, L lap tray in place.   Therapy Documentation Precautions:  Precautions Precautions: Fall Precaution Comments: left neglect, left hemiparesis, slight pusher to the left Restrictions Weight Bearing Restrictions: No   Therapy/Group: Individual Therapy  Karoline Caldwell, PT, DPT, CBIS 12/12/2019, 7:42 AM

## 2019-12-12 NOTE — Progress Notes (Signed)
Patient ID: Charles Sherman, male   DOB: Jul 15, 1945, 74 y.o.   MRN: [id number] Team Conference Report to Patient/Family  Team Conference discussion was reviewed with the patient and caregiver, including goals, any changes in plan of care and target discharge date.  Patient and caregiver express understanding and are in agreement.  The patient has a target discharge date of 12/25/19.  Dyanne Iha 12/12/2019, 1:44 PM

## 2019-12-12 NOTE — Progress Notes (Addendum)
[place] PHYSICAL MEDICINE & REHABILITATION PROGRESS NOTE   Subjective/Complaints:  Seen in physical therapy, no complaints today. Has some difficulty sleeping. Reviewed labs which are stable  ROS: Denies CP, SOB, N/V/D  Objective:   No results found. Recent Labs    12/10/19 0634  WBC 6.5  HGB 12.2*  HCT 36.2*  PLT 123*   Recent Labs    12/10/19 0634  NA 142  K 4.0  CL 104  CO2 31  GLUCOSE 129*  BUN 27*  CREATININE 1.38*  CALCIUM 9.4    Intake/Output Summary (Last 24 hours) at 12/12/2019 0916 Last data filed at 12/12/2019 0800 Gross per 24 hour  Intake 480 ml  Output 10 ml  Net 470 ml        Physical Exam: Vital Signs Blood pressure 119/62, pulse (!) 50, temperature 98.2 F (36.8 C), temperature source Oral, resp. rate 20, height 5' 11"  (1.803 m), weight 102.7 kg, SpO2 99 %.  General: No acute distress Mood and affect are appropriate Heart: Regular rate and rhythm no rubs murmurs or extra sounds Lungs: Clear to auscultation, breathing unlabored, no rales or wheezes Abdomen: Positive bowel sounds, soft nontender to palpation, nondistended Extremities: No clubbing, cyanosis, or edema Skin: No evidence of breakdown, no evidence of rash  Motor: LLE: Hip flexion 0/5, knee extension 2-/5, ankle dorsiflexion 0/5, Left plantar flexion 2-/5, unchanged Left UE 2-/5 elbow flex/ext, trace finger flexion, unchanged  Assessment/Plan: 1. Functional deficits secondary to Right ICA distribution infarcts  which require 3+ hours per day of interdisciplinary therapy in a comprehensive inpatient rehab setting.  Physiatrist is providing close team supervision and 24 hour management of active medical problems listed below.  Physiatrist and rehab team continue to assess barriers to discharge/monitor patient progress toward functional and medical goals  Care Tool:  Bathing    Body parts bathed by patient: Front perineal area, Right upper leg, Left upper leg, Right  lower leg, Left arm, Chest, Abdomen, Face   Body parts bathed by helper: Buttocks, Left lower leg, Right arm     Bathing assist Assist Level: Moderate Assistance - Patient 50 - 74%     Upper Body Dressing/Undressing Upper body dressing   What is the patient wearing?: Pull over shirt    Upper body assist Assist Level: Moderate Assistance - Patient 50 - 74%    Lower Body Dressing/Undressing Lower body dressing      What is the patient wearing?: Incontinence brief, Pants     Lower body assist Assist for lower body dressing: Maximal Assistance - Patient 25 - 49%     Toileting Toileting    Toileting assist Assist for toileting: 2 Helpers     Transfers Chair/bed transfer  Transfers assist     Chair/bed transfer assist level: Moderate Assistance - Patient 50 - 74% (squat pivot to the right)     Locomotion Ambulation   Ambulation assist   Ambulation activity did not occur: Safety/medical concerns  Assist level: 2 helpers Assistive device: Other (comment) (3 muskateer vs wall rail) Max distance: 10'   Walk 10 feet activity   Assist     Assist level: 2 helpers Assistive device: Other (comment) (3 muskateer vs wall rail)   Walk 50 feet activity   Assist    Assist level: 2 helpers Assistive device: Other (comment) (3 Musketeer)    Walk 150 feet activity   Assist           Walk 10 feet on uneven surface  activity   Assist Walk 10 feet on uneven surfaces activity did not occur: Safety/medical concerns         Wheelchair     Assist Will patient use wheelchair at discharge?: Yes Type of Wheelchair: Manual Wheelchair activity did not occur: Safety/medical concerns  Wheelchair assist level: Total Assistance - Patient < 25%      Wheelchair 50 feet with 2 turns activity    Assist        Assist Level: Total Assistance - Patient < 25%   Wheelchair 150 feet activity     Assist      Assist Level: Total Assistance -  Patient < 25%   Blood pressure 119/62, pulse (!) 50, temperature 98.2 F (36.8 C), temperature source Oral, resp. rate 20, height 5' 11"  (1.803 m), weight 102.7 kg, SpO2 99 %.   Medical Problem List and Plan: 1.  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  Status post right ICA revascularization with stent assisted angioplasty 11/21/2019  Continue CIR,  Team conference today please see physician documentation under team conference tab, met with team  to discuss problems,progress, and goals. Formulized individual treatment plan based on medical history, underlying problem and comorbidities.   WHO/PRAFO nightly 2.  Antithrombotics: -DVT/anticoagulation: Lovenox             -antiplatelet therapy: Aspirin 81 mg daily and Brilinta 90 mg twice daily 3. Pain Management: Well controlled 4. Mood: Provide emotional support             -antipsychotic agents: N/A 5. Neuropsych: This patient is capable of making decisions on his own behalf. 6. Skin/Wound Care: Routine skin checks 7. Fluids/Electrolytes/Nutrition: Routine in and outs 8.  Right medial sphenoid wing meningioma.  Followed by neurosurgery.  Decadron protocol.  No current indications for surgical intervention at this time Will wean 9.  Hypertension.  Norvasc 10 mg daily, Zaroxolyn 2.5 mg 1/day on Monday, Lopressor 12.5 mg twice daily, Imdur 120 mg daily.  Vitals:   12/11/19 1959 12/12/19 0342  BP: (!) 100/47 119/62  Pulse: 60 (!) 50  Resp: 20 20  Temp: 97.9 F (36.6 C) 98.2 F (36.8 C)  SpO2: 99% 99%   Slightly soft on 10/18, will consider decreasing medications if persistent 10.  Diastolic congestive heart failure.  metoprolol, Lasix 40 mg twice daily.  Monitor for any signs of fluid overload  Lasix decreased to 41m BID  Daily weights  Filed Weights   12/10/19 0500 12/11/19  0500 12/12/19 0342  Weight: 105 kg 101.9 kg 102.7 kg   Stable on 10/19 11.  Hyperlipidemia.  Lipitor 12.  History of COPD/ tobacco abuse.  Counseling.  Albuterol inhaler as needed 13.  GERD.  Protonix 14.  Post stroke dysphagia.  Advanced to Dysphagia #3 thin liquids.  Follow-up speech therapy 15. Bradycardic:on BB monitor TID., D/C BB  , checked 10/8 EKG sinus brady rate 49 Vitals:   12/11/19 1959 12/12/19 0342  BP: (!) 100/47 119/62  Pulse: 60 (!) 50  Resp: 20 20  Temp: 97.9 F (36.6 C) 98.2 F (36.8 C)  SpO2: 99% 99%   Controlled on 10/19 16.  Morbid obesity: Encourage weight loss 17.  RIght eye closure improved blepharitis+/- blepharospasm-able to open Wean  sulfacetamide and prednisolone drops to QID 18.  AKI  Creatinine 1.38 on 10/18  Encourage fluids  Continue  to monitor 19.  Thrombocytopenia  Platelets 123 on 10/18, trending down  Repeat plt in am  may need to hold lovenox if dropping to <100K  LOS: 14 days A FACE TO [place] E Jennier Schissler 12/12/2019, 9:16 AM

## 2019-12-12 NOTE — Progress Notes (Signed)
Physical Therapy Session Note  Patient Details  Name: Charles Sherman MRN: [id number] Date of Birth: 1945/12/17  Today's Date: 12/12/2019 PT Individual Time: 1033-1100 PT Individual Time Calculation (min): 27 min   Short Term Goals: Week 2:  PT Short Term Goal 1 (Week 2): Pt will consistently perform supine<>sit with mod assist PT Short Term Goal 2 (Week 2): Pt will consistently perform sit<>stand with mod assist of 1 PT Short Term Goal 3 (Week 2): Pt will consistently perform bed<>chair transfers with mod assist of 1 PT Short Term Goal 4 (Week 2): Pt will ambulate at least 63f using LRAD with mod assist of 1 (+2 w/c follow if needed)  Skilled Therapeutic Interventions/Progress Updates: Pt presented in w/c sliding significantly in seat. Rehab tech blocking pt and PTA assisting in scooting pt posteriorly in seat to avoid sliding out of w/c. Per pt was trying to re-adjust and caused pt to scoot forward unaware that he was almost out of seat. Pt then transported to day room and participated in w/c mobility in room. Pt required overall modA for w/c propulsion due to poor coordination and difficulty maintaining traction on floor with RLE. Pt was able to use only RUE or RLE and intermittently use both with pt attempting to correct trajectory with LE and mod multimodal cues. Pt transported back to room and performed ambulatory transfer via  "three musketeers" to bed with maxA and max multimodal cues for sequencing. Pt required modA for sit to supine. During transfer pt indicated bladder incontinence. Upon returning to bed PTA notified NT of pt's disposition. Pt left in bed with alarm on, call bell within reach and needs met.      Therapy Documentation Precautions:  Precautions Precautions: Fall Precaution Comments: left neglect, left hemiparesis, slight pusher to the left Restrictions Weight Bearing Restrictions: No General:   Vital Signs:  Pain: Pain Assessment Pain Scale: 0-10 Pain  Score: 0-No pain   Therapy/Group: Individual Therapy  Sherry Rogus  Keon Benscoter, PTA  12/12/2019, 12:40 PM

## 2019-12-13 ENCOUNTER — Inpatient Hospital Stay (HOSPITAL_COMMUNITY): Payer: No Typology Code available for payment source

## 2019-12-13 ENCOUNTER — Inpatient Hospital Stay (HOSPITAL_COMMUNITY): Payer: No Typology Code available for payment source | Admitting: Physical Therapy

## 2019-12-13 ENCOUNTER — Inpatient Hospital Stay (HOSPITAL_COMMUNITY): Payer: No Typology Code available for payment source | Admitting: Occupational Therapy

## 2019-12-13 ENCOUNTER — Inpatient Hospital Stay (HOSPITAL_COMMUNITY): Payer: No Typology Code available for payment source | Admitting: Speech Pathology

## 2019-12-13 LAB — PLATELET COUNT: Platelets: 116 10*3/uL — ABNORMAL LOW (ref 150–400)

## 2019-12-13 NOTE — Progress Notes (Signed)
Physical Therapy Session Note  Patient Details  Name: Charles Sherman MRN: [id number] Date of Birth: 1945/05/18  Today's Date: 12/13/2019 PT Individual Time: 0845-1000 PT Individual Time Calculation (min): 75 min   Short Term Goals: Week 2:  PT Short Term Goal 1 (Week 2): Pt will consistently perform supine<>sit with mod assist PT Short Term Goal 2 (Week 2): Pt will consistently perform sit<>stand with mod assist of 1 PT Short Term Goal 3 (Week 2): Pt will consistently perform bed<>chair transfers with mod assist of 1 PT Short Term Goal 4 (Week 2): Pt will ambulate at least 27ft using LRAD with mod assist of 1 (+2 w/c follow if needed)  Skilled Therapeutic Interventions/Progress Updates:    Patient received supine in bed, asleep, but easy to rouse and agreeable to PT. He denies pain. Since patient moved rooms, previously dumped standard wc was not present in new hospital room. PT dumped standard wc for patient to minimize risk of sliding out. Patient requiring ModA to don pants supine in bed. He is improving his ability to roll using bed rails, but does still require CGA for rolling R to engage L UE/LE. MiNA supine > sit edge of bed. MaxA squat pivot to wc. Patient with limited ability to adjust L side of body in wc, requiring assist from PT for proper pelvic placement in wc. PT propelling patient in wc to therapy gym for time management and energy conservation. He was able to engage in wc mobility exercises using B LE. Verbal and tactile cues needed to engage L LE. Approximation through L distal femur to apply greater weightbearing for improved efficiency of wc mobility. Patient able to use R UE to apply approximation himself. Given very short attention to task, he does require consistent redirection to therapeutic task. He was able to propel wc a total of 73ft with ModA. Patient returning to room in wc, seatbelt alarm on, call light within reach, RN aware of patients placement in chair.    Therapy Documentation Precautions:  Precautions Precautions: Fall Precaution Comments: left neglect, left hemiparesis, slight pusher to the left Restrictions Weight Bearing Restrictions: No    Therapy/Group: Individual Therapy  Karoline Caldwell, PT, DPT, CBIS 12/13/2019, 7:40 AM

## 2019-12-13 NOTE — Progress Notes (Signed)
Occupational Therapy Weekly Progress Note  Patient Details  Name: Charles Sherman MRN: [id number] Date of Birth: May 01, 1945  Beginning of progress report period: December 07, 2019 End of progress report period: December 13, 2019  Today's Date: 12/13/2019 OT Individual Time: 7253-6644 OT Individual Time Calculation (min): 49 min    Patient has met 2 of 4 short term goals.  Charles Sherman is making steady progress with OT at this time.  He continues to demonstrate left inattention as well as left visual field cut, requiring mod instructional cueing to scan left of midline for locating bathing and grooming items at times.  He also continues with LUE and LLE hemiparesis, but is showing improvements in functional movement.  He is able to demonstrate Brunnstrum stage III movement in the LUE with max hand over hand assistance needed for integration as a gross assist into selfcare tasks.  He is able to complete UB bathing at min assist currently with LB bathing at mod assist sit to stand.  Max instructional cueing is needed for sequencing as well as for sustained attention as he will stop working on a task after a few seconds and sit with his head and gaze to the right.  He needs mod assist for UB dressing with mod assist as well for donning brief and pants sit to stand.  Max assist is still needed for donning TEDs and gripper socks using one handed technique.  He is able to complete squat pivot transfers to the right at min assist but needs max assist for stand pivots to both directions.  Slight pusher tendencies are still present to the left during dynamic balance tasks in both sitting and standing.  Toileting tasks are currently mod assist as well.  Feel he is making steady progress toward established min to mod assist goals with expected discharge on 11/2.  Recommend continued OT at this time at CIR level.    Patient continues to demonstrate the following deficits: muscle weakness and muscle paralysis,  impaired timing and sequencing, abnormal tone, unbalanced muscle activation and decreased coordination, field cut, decreased attention to left and left side neglect, decreased initiation, decreased attention, decreased awareness, decreased problem solving, decreased safety awareness, decreased memory and delayed processing and decreased sitting balance, decreased standing balance, decreased postural control, hemiplegia and decreased balance strategies and therefore will continue to benefit from skilled OT intervention to enhance overall performance with BADL and Reduce care partner burden.  Patient progressing toward long term goals..  Continue plan of care.  OT Short Term Goals Week 3:  OT Short Term Goal 1 (Week 3): Pt will completed UB dressing with min assist following hemi dressing techiques. OT Short Term Goal 2 (Week 3): Pt will scan left of midline with no more than min instructional cueing to locate selfcare items during bathing tasks. OT Short Term Goal 3 (Week 3): Pt will use the LUE as a gross assist with min facilitation during selfcare tasks. OT Short Term Goal 4 (Week 3): Pt will complete toilet transfers to the left and right stand pivot with no more than mod assist.  Skilled Therapeutic Interventions/Progress Updates:    Pt transferred from supine to sit on the right side of the bed with mod assist.  He then completed squat pivot transfer to the wheelchair with min assist to the right.  Once in the therapy gym, he completed stand pivot transfer to the right at max assist level,with slight pushing to the left noted.  In sitting worked on  LUE neuromuscular re-education.  Began with simple weightbearing task over the LUE with pt asked to follow therapist's hand with his right hand while keeping his balance, and keeping himself from falling to the left.  He needed max facilitation to keep the left elbow extended as well as min assist to maintain his balance.  He also demonstrated limited  sustained attention to task as he would only complete it for a few seconds and then his eyes would deviate to the right and he would lose his attention.  Transitioned to use of the UE Ranger as well.  Again he was able to activate shoulder flexors and elbow extensors to push it forward, however he could not sustain his attention on the task.  Finished session with transfer back to the wheelchair at max assist level stand pivot.  He was taken back to the room where he remained sitting up with the call button and the phone in reach.  Safety belt in place as well.    Therapy Documentation Precautions:  Precautions Precautions: Fall Precaution Comments: left neglect, left hemiparesis, slight pusher to the left Restrictions Weight Bearing Restrictions: No  Pain: Pain Assessment Pain Score: 0-No pain ADL: See Care Tool Section for some details of mobility and selfcare  Therapy/Group: Individual Therapy  Larya Charpentier OTR/L 12/13/2019, 12:42 PM

## 2019-12-13 NOTE — Progress Notes (Signed)
Patient ID: Charles Sherman, male   DOB: 1945-06-11, 74 y.o.   MRN: [id number]  Received [facility] social work information from sister (healthcare POA):  [facility] [place]  [phone]

## 2019-12-13 NOTE — Progress Notes (Signed)
Physical Therapy Session Note  Patient Details  Name: Charles Sherman MRN: [id number] Date of Birth: Apr 06, 1945  Today's Date: 12/13/2019 PT Individual Time: 1712-1740 PT Individual Time Calculation (min): 28 min   Short Term Goals: Week 2:  PT Short Term Goal 1 (Week 2): Pt will consistently perform supine<>sit with mod assist PT Short Term Goal 2 (Week 2): Pt will consistently perform sit<>stand with mod assist of 1 PT Short Term Goal 3 (Week 2): Pt will consistently perform bed<>chair transfers with mod assist of 1 PT Short Term Goal 4 (Week 2): Pt will ambulate at least 41ft using LRAD with mod assist of 1 (+2 w/c follow if needed)  Skilled Therapeutic Interventions/Progress Updates:    Pt received asleep, supine in bed and upon awakening agreeable to therapy session though pt disoriented thinking it was AM - reoriented pt to time of day. Supine>sitting R EOB, HOB partially elevated and using bedrail, with min assist for trunk upright - cuing for L hemibody involvement to increase pt independence. R stand pivot EOB>w/c with min assist for lifting into standing and min/mod assist for balance while turning - therapist guarding L knee - upon sitting in chair pt has strong L posterior trunk LOB requiring max assist to correct.  Transported to/from gym in w/c for time management and energy conservation. Gait training ~49ft x 2 with +2 mod assist via 3 Musketeer support - pt continues to demo L LE flexor tone during swing requiring facilitation for increased hip abduction and increased knee extension on initial contact - therapist guarding L knee during stance with minimal buckling noted - therapist facilitating increased pelvic anterior translation over L LE during stance to promote increased hip abductor and glute activation to improve stance control. Stand pivot w/c<>EOM, no AD, with mod assist of 1 - with fatigue pt demoing slight increased L LE buckle during stance - again upon sitting down  pt has L posterior trunk LOB requiring max assist to correct. Repeated sit<>stands x6 reps to/from EOM, no UE support, with min/mod assist of 1 and using mirror feedback for midline orientation and increased L LE weightbearing. Transported back to room and pt agreeable to remain seated in w/c for meal - left with seat belt alarm on, needs in reach, and L UE supported on half lap tray - notified NT that pt ready for appropriate supervision during his meal.  Therapy Documentation Precautions:  Precautions Precautions: Fall Precaution Comments: left neglect, left hemiparesis, slight pusher to the left Restrictions Weight Bearing Restrictions: No  Pain: Denies pain during session.  Therapy/Group: Individual Therapy  [name] , PT, DPT, CSRS  12/13/2019, 4:04 PM

## 2019-12-13 NOTE — Progress Notes (Signed)
[place] PHYSICAL MEDICINE & REHABILITATION PROGRESS NOTE   Subjective/Complaints:  Moved to a new room   ROS: Denies CP, SOB, N/V/D  Objective:   No results found. Recent Labs    12/13/19 0601  PLT 116*   No results for input(s): NA, K, CL, CO2, GLUCOSE, BUN, CREATININE, CALCIUM in the last 72 hours.  Intake/Output Summary (Last 24 hours) at 12/13/2019 0753 Last data filed at 12/12/2019 1809 Gross per 24 hour  Intake 530 ml  Output --  Net 530 ml        Physical Exam: Vital Signs Blood pressure (!) 113/54, pulse (!) 51, temperature 98.6 F (37 C), resp. rate 16, height 5\' 11"  (1.803 m), weight 102.7 kg, SpO2 97 %.   General: No acute distress Mood and affect are appropriate Heart: Regular rate and rhythm no rubs murmurs or extra sounds Lungs: Clear to auscultation, breathing unlabored, no rales or wheezes Abdomen: Positive bowel sounds, soft nontender to palpation, nondistended Extremities: No clubbing, cyanosis, or edema Skin: No evidence of breakdown, no evidence of rash    Motor: LLE: Hip flexion 0/5, knee extension 2-/5, ankle dorsiflexion 0/5, Left plantar flexion 2-/5, unchanged Left UE 2-/5 elbow flex/ext, trace finger flexion, unchanged  Assessment/Plan: 1. Functional deficits secondary to Right ICA distribution infarcts  which require 3+ hours per day of interdisciplinary therapy in a comprehensive inpatient rehab setting.  Physiatrist is providing close team supervision and 24 hour management of active medical problems listed below.  Physiatrist and rehab team continue to assess barriers to discharge/monitor patient progress toward functional and medical goals  Care Tool:  Bathing    Body parts bathed by patient: Front perineal area, Right upper leg, Left upper leg, Right lower leg, Left arm, Chest, Abdomen, Face   Body parts bathed by helper: Buttocks, Left lower leg, Right arm     Bathing assist Assist Level: Moderate Assistance - Patient  50 - 74%     Upper Body Dressing/Undressing Upper body dressing   What is the patient wearing?: Pull over shirt    Upper body assist Assist Level: Moderate Assistance - Patient 50 - 74%    Lower Body Dressing/Undressing Lower body dressing      What is the patient wearing?: Incontinence brief, Pants     Lower body assist Assist for lower body dressing: Maximal Assistance - Patient 25 - 49%     Toileting Toileting    Toileting assist Assist for toileting: 2 Helpers     Transfers Chair/bed transfer  Transfers assist     Chair/bed transfer assist level: Moderate Assistance - Patient 50 - 74%     Locomotion Ambulation   Ambulation assist   Ambulation activity did not occur: Safety/medical concerns  Assist level: 2 helpers Assistive device: Maxi Sky (MaxiSky + rolling walker) Max distance: 15   Walk 10 feet activity   Assist     Assist level: 2 helpers Assistive device:  (MaxiSky + rolling walker)   Walk 50 feet activity   Assist    Assist level: 2 helpers Assistive device: Other (comment) (3 Musketeer)    Walk 150 feet activity   Assist           Walk 10 feet on uneven surface  activity   Assist Walk 10 feet on uneven surfaces activity did not occur: Safety/medical concerns         Wheelchair     Assist Will patient use wheelchair at discharge?: Yes Type of Wheelchair: Manual Wheelchair activity  did not occur: Safety/medical concerns  Wheelchair assist level: Maximal Assistance - Patient 25 - 49% Max wheelchair distance: 25    Wheelchair 50 feet with 2 turns activity    Assist        Assist Level: Maximal Assistance - Patient 25 - 49%   Wheelchair 150 feet activity     Assist      Assist Level: Total Assistance - Patient < 25%   Blood pressure (!) 113/54, pulse (!) 51, temperature 98.6 F (37 C), resp. rate 16, height 5\' 11"  (1.803 m), weight 102.7 kg, SpO2 97 %.   Medical Problem List and  Plan: 1.  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  Status post right ICA revascularization with stent assisted angioplasty 11/21/2019  Continue CIR,  PT, OT, SLP ELOS 11/2  WHO/PRAFO nightly 2.  Antithrombotics: -DVT/anticoagulation: Lovenox             -antiplatelet therapy: Aspirin 81 mg daily and Brilinta 90 mg twice daily 3. Pain Management: Well controlled 4. Mood: Provide emotional support             -antipsychotic agents: N/A 5. Neuropsych: This patient is capable of making decisions on his own behalf. 6. Skin/Wound Care: Routine skin checks 7. Fluids/Electrolytes/Nutrition: Routine in and outs 8.  Right medial sphenoid wing meningioma.  Followed by neurosurgery.  Decadron protocol.  No current indications for surgical intervention at this time Will wean 9.  Hypertension.  Norvasc 10 mg daily, Zaroxolyn 2.5 mg 1/day on Monday, Lopressor 12.5 mg twice daily, Imdur 120 mg daily.  Vitals:   12/12/19 2059 12/13/19 0313  BP: (!) 115/58 (!) 113/54  Pulse: (!) 54 (!) 51  Resp: 16 16  Temp: 98.5 F (36.9 C) 98.6 F (37 C)  SpO2:  97%   Slightly soft on 10/18, will consider decreasing medications if persistent 10.  Diastolic congestive heart failure.  metoprolol, Lasix 40 mg twice daily.  Monitor for any signs of fluid overload  Lasix decreased to 20mg  BID  Daily weights  Filed Weights   12/10/19 0500 12/11/19 0500 12/12/19 0342  Weight: 105 kg 101.9 kg 102.7 kg   Stable on 10/19 11.  Hyperlipidemia.  Lipitor 12.  History of COPD/ tobacco abuse.  Counseling.  Albuterol inhaler as needed 13.  GERD.  Protonix 14.  Post stroke dysphagia.  Advanced to Dysphagia #3 thin liquids.  Follow-up speech therapy 15. Bradycardic:on BB monitor TID., D/C BB  , checked 10/8 EKG sinus brady rate 49 Vitals:   12/12/19 2059 12/13/19 0313  BP:  (!) 115/58 (!) 113/54  Pulse: (!) 54 (!) 51  Resp: 16 16  Temp: 98.5 F (36.9 C) 98.6 F (37 C)  SpO2:  97%   Controlled on 10/19 16.  Morbid obesity: Encourage weight loss 17.  RIght eye closure improved blepharitis+/- blepharospasm-able to open Wean  sulfacetamide and prednisolone drops to QID 18.  AKI  Creatinine 1.38 on 10/18  Encourage fluids  Continue to monitor 19.  Thrombocytopenia  Platelets 123 on 10/18, fairly stable at 116K today   Repeat plt in am  may need to hold lovenox if dropping to <100K  LOS: 15 days A FACE TO [place] [facility] Aleila Syverson 12/13/2019, 7:53 AM

## 2019-12-14 ENCOUNTER — Inpatient Hospital Stay (HOSPITAL_COMMUNITY): Payer: No Typology Code available for payment source

## 2019-12-14 ENCOUNTER — Inpatient Hospital Stay (HOSPITAL_COMMUNITY): Payer: No Typology Code available for payment source | Admitting: Speech Pathology

## 2019-12-14 ENCOUNTER — Inpatient Hospital Stay (HOSPITAL_COMMUNITY): Payer: No Typology Code available for payment source | Admitting: Occupational Therapy

## 2019-12-14 NOTE — Progress Notes (Signed)
Patient ID: Charles Sherman, male   DOB: [birth year], 74 y.o.   MRN: [id number]  Patient script for ramp and walk-in shower faxed to [place], [place]

## 2019-12-14 NOTE — Plan of Care (Signed)
  Problem: RH Ambulation Goal: LTG Patient will ambulate in controlled environment (PT) Description: LTG: Patient will ambulate in a controlled environment, # of feet with assistance (PT). Flowsheets (Taken 12/14/2019 0809) LTG: Ambulation distance in controlled environment: (downgraded due to patient progress) 48ft using LRAD Note: downgraded due to patient progress

## 2019-12-14 NOTE — Progress Notes (Signed)
Physical Therapy Weekly Progress Note  Patient Details  Name: Charles Sherman MRN: [id number] Date of Birth: 11/10/1945  Beginning of progress report period: December 07, 2019 End of progress report period: December 14, 2019    Patient has met 2 of 4 short term goals.  Patient progressing slowly toward gait goals. 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 His bed mobility has improved to where he requires MinA/CGA of 1 to complete successfully and safely. Patient has improved his static and dynamic sitting balance since admission and can complete this safely with stand by assist.   Patient continues to demonstrate the following deficits muscle weakness, decreased cardiorespiratoy endurance, impaired timing and sequencing, abnormal tone, unbalanced muscle activation, decreased coordination and decreased motor planning, decreased visual motor skills, decreased midline orientation, decreased attention to left, left side neglect and decreased motor planning, decreased initiation, decreased attention, decreased awareness, decreased problem solving, decreased safety awareness and delayed processing and decreased standing balance, decreased postural control, hemiplegia and decreased balance strategies and therefore will continue to benefit from skilled PT intervention to increase functional independence with mobility.  Patient progressing toward long term goals..  Continue plan of care.  PT Short Term Goals Week 1:  PT Short Term Goal 1 (Week 1): Pt will transfer sup to sit w/ max A of 1. PT Short Term Goal 1 - Progress (Week 1): Met PT Short Term Goal 2 (Week 1): Pt will sit EOB w/ mod A x 3' PT Short Term Goal 2 - Progress (Week 1): Met PT Short Term Goal 3 (Week 1): Pt will transfer sit to stand w/ Stedy and max A w/ improved midline standing position. PT Short Term Goal 3 - Progress (Week 1): Met Week 2:  PT Short Term Goal 1 (Week 2): Pt will consistently perform supine<>sit with  mod assist PT Short Term Goal 1 - Progress (Week 2): Met PT Short Term Goal 2 (Week 2): Pt will consistently perform sit<>stand with mod assist of 1 PT Short Term Goal 2 - Progress (Week 2): Met PT Short Term Goal 3 (Week 2): Pt will consistently perform bed<>chair transfers with mod assist of 1 PT Short Term Goal 3 - Progress (Week 2): Progressing toward goal PT Short Term Goal 4 (Week 2): Pt will ambulate at least 54f using LRAD with mod assist of 1 (+2 w/c follow if needed) PT Short Term Goal 4 - Progress (Week 2): Progressing toward goal Week 3:  PT Short Term Goal 1 (Week 3): STG=LTG based on ELOS   Therapy Documentation Precautions:  Precautions Precautions: Fall Precaution Comments: left neglect, left hemiparesis, slight pusher to the left Restrictions Weight Bearing Restrictions: No   Therapy/Group: Individual Therapy  JDebbora Dus10/22/2021, 7:49 AM

## 2019-12-14 NOTE — Progress Notes (Signed)
Physical Therapy Session Note  Patient Details  Name: Charles Sherman MRN: [id number] Date of Birth: Aug 02, 1945  Today's Date: 12/14/2019 PT Individual Time: 1100-1200 PT Individual Time Calculation (min): 60 min   Short Term Goals: Week 2:  PT Short Term Goal 1 (Week 2): Pt will consistently perform supine<>sit with mod assist PT Short Term Goal 2 (Week 2): Pt will consistently perform sit<>stand with mod assist of 1 PT Short Term Goal 3 (Week 2): Pt will consistently perform bed<>chair transfers with mod assist of 1 PT Short Term Goal 4 (Week 2): Pt will ambulate at least 16ft using LRAD with mod assist of 1 (+2 w/c follow if needed)  Skilled Therapeutic Interventions/Progress Updates:    Patient received supine in bed, agreeable to PT. He denies pain, but endorses fatigue. He does appear to be more fatigued than previous therapy days. MaxA donning pants supine in bed. MinA + verbal cues for rolling to don pants. ModA for supine > sitting edge of bed. Increase in assistance needed likely related to fatigue levels. [place] x2 for squat pivot to wc leading L. Patient able to stand with ModA/MaxA x1 with RW and modified hand grip with mirror for visual feedback. Vertical line drawn on mirror for increased orientation to vertical. Patient receptive to using line to assist with midline orientation. ModA + verbal cuing initially to maintain midline and minimize L lateral lean. Patient marching in place with ModA x1 for pregait warm up. He was able to ambulate 33ft with MaxA x1 with RW and modified hand grip with mirror for visual feedback and wc follow. Verbal cues to maintain L LE abduction through swing phase to minimize scissoring gait pattern. Patient then able to ambulate 56ft with ModA x1 + RW, modified hand grip + wc follow. Min verbal cues for L LE placement. Patient requiring extended seated rest break after ambulation due to fatigue. He was able to propel the wc 102ft using B LE with coban  around feet for increased grip (no shoes available to use) and MinA. Intermittent verbal cuing needed to attend to task at hand. Patient returning to room in wc, seatbelt alarm on, call light within reach.   Therapy Documentation Precautions:  Precautions Precautions: Fall Precaution Comments: left neglect, left hemiparesis, slight pusher to the left Restrictions Weight Bearing Restrictions: No    Therapy/Group: Individual Therapy  Karoline Caldwell, PT, DPT, CBIS  12/14/2019, 7:40 AM

## 2019-12-14 NOTE — Progress Notes (Signed)
Occupational Therapy Session Note  Patient Details  Name: Charles Sherman MRN: [id number] Date of Birth: 1945/10/20  Today's Date: 12/14/2019 OT Individual Time: 1305-1403 OT Individual Time Calculation (min): 58 min    Short Term Goals: Week 2:  OT Short Term Goal 1 (Week 2): Pt will completed UB dressing with min assist following hemi dressing techiques. OT Short Term Goal 1 - Progress (Week 2): Not met OT Short Term Goal 2 (Week 2): Pt will complete LB bathing with mod assist sit to stand. OT Short Term Goal 2 - Progress (Week 2): Met OT Short Term Goal 3 (Week 2): Pt will complete LB dressing with mod assist for brief and pants only. OT Short Term Goal 3 - Progress (Week 2): Met OT Short Term Goal 4 (Week 2): Pt will scan left of midline with no more than min instructional cueing to locate selfcare items during bathing tasks. OT Short Term Goal 4 - Progress (Week 2): Not met  Week 3:  OT Short Term Goal 1 (Week 3): Pt will completed UB dressing with min assist following hemi dressing techiques. OT Short Term Goal 2 (Week 3): Pt will scan left of midline with no more than min instructional cueing to locate selfcare items during bathing tasks. OT Short Term Goal 3 (Week 3): Pt will use the LUE as a gross assist with min facilitation during selfcare tasks. OT Short Term Goal 4 (Week 3): Pt will complete toilet transfers to the left and right stand pivot with no more than mod assist.  Skilled Therapeutic Interventions/Progress Updates:    Pt received in bed and agreeable to therapy.  Pt rolled to R side with min A and then pushup to sit with min A.  Stand pivot to his R to wc with min A then pt transported to gym.  Min stand pivot to his R to mat.  Numerous cues throughout session for upright posture as pt sit with kyphosis/ rounded shoulders. Explained to pt that his posture impacts his shoulder alignment and can lead to pain.  When cued, pt able to sit upright and hold posture until  he got tired or distracted.  Therapy session focus on LUE to facilitate functional movement with various activities: -Weight bearing on L hand with wt shifts -wt bearing on elbow to hand to elbow movement pattern to faciltate triceps -a/arom with moving sh forward and back rolling a ball.  -B hands holding ball with full A for L hand to stay in position on ball with moving shoulders forward and back in a small range. -hand over hand A at wrist as pt worked on grasp and release of cones with trunk rotation to his L to pick up cones and then forward to place cones on bench. -pronation/ supin a/arom with pt holding onto a light tube -self ROM for elbow with support at his wrist.   He has some isometric grasp but very weak wrist stabilizers and no tip pinch.    occasional cues to visually scan to his L and attend to L side.  He then completed stand pivot to his R back to wc and then back to his bed with min a.  Mod A to lift legs to move into supine.  Pt returned to bed per his request.  Placed pillow under L arm for support. Encouraged pt to do some self ROM this weekend.  Bed alarm set and all needs met.   Therapy Documentation Precautions:  Precautions Precautions: Fall Precaution  Comments: left neglect, left hemiparesis, slight pusher to the left Restrictions Weight Bearing Restrictions: No   Pain: Pain Assessment Pain Scale: 0-10 Pain Score: 0-No pain     Therapy/Group: Individual Therapy  [place] 12/14/2019, 12:55 PM

## 2019-12-14 NOTE — Progress Notes (Signed)
Speech Language Pathology Daily Session Note  Patient Details  Name: Charles Sherman MRN: [id number] Date of Birth: 1945/06/06  Today's Date: 12/14/2019 SLP Individual Time: 0901-1000 SLP Individual Time Calculation (min): 59 min  Short Term Goals: Week 3: SLP Short Term Goal 1 (Week 3): Pt will consume current regulat texture thin liquid diet Mod I for use of swallow strategies for oral clearance of left pocketing to increase independence with strategies and work toward decreasing supervision requirement during meals. SLP Short Term Goal 2 (Week 3): Pt will demonstrate ability to problem solve mildly complex tasks with Min A verbal/visual cues. SLP Short Term Goal 3 (Week 3): Pt will detect functional errors with Min A verbal/visual cues. SLP Short Term Goal 4 (Week 3): Pt will scan left visual field or attend to objects in left side of environment in 5/10 opportunities with Mod A multimodal cues. SLP Short Term Goal 5 (Week 3): Pt will selectively attend to functional tasks with Supervision A verbal and visual cues.  Skilled Therapeutic Interventions:  Pt was seen for skilled ST targeting goals for dysphagia and cognition.  Pt was in bed upon arrival, lethargic, but agreeable to participating in treatment. Pt was repositioned to maximize safety for PO intake and alertness for participation in session.  Pt was able to read a simplified version of his daily therapy schedule (large print, less text) with min assist verbal cues for use of a left marginal anchor to facilitate visual scanning to the left side of the Charles Sherman.  SLP facilitated the session with a functional snack of regular textures and thin liquids with no overt s/s of aspiration and distant supervision cues for use of swallowing precautions to clear oral residue post swallow.  Throughout snack, pt required min cues to locate food and drink items on the left side of his table tray.  SLP also facilitated the session with a previously  taught card game to address visual scanning.  Pt needed mod assist verbal cues to locate cards to the left of midline.  Pt became increasingly lethargic as session progressed and needed x1 verbal cue for arousal.  Pt was left in bed with bed alarm set and call bell within reach.  Continue per current plan of care.    Pain Pain Assessment Pain Scale: 0-10 Pain Score: 0-No pain  Therapy/Group: Individual Therapy  [place], Selinda Orion 12/14/2019, 9:58 AM

## 2019-12-14 NOTE — Progress Notes (Signed)
[place] PHYSICAL MEDICINE & REHABILITATION PROGRESS NOTE   Subjective/Complaints:  No pain c/os  ROS: Denies CP, SOB, N/V/D  Objective:   No results found. Recent Labs    12/13/19 0601  PLT 116*   No results for input(s): NA, K, CL, CO2, GLUCOSE, BUN, CREATININE, CALCIUM in the last 72 hours.  Intake/Output Summary (Last 24 hours) at 12/14/2019 0746 Last data filed at 12/13/2019 1824 Gross per 24 hour  Intake 460 ml  Output --  Net 460 ml        Physical Exam: Vital Signs Blood pressure 115/70, pulse (!) 55, temperature 98.4 F (36.9 C), temperature source Oral, resp. rate 18, height 5\' 11"  (1.803 m), weight 102.7 kg, SpO2 100 %.   General: No acute distress Mood and affect are appropriate Heart: Regular rate and rhythm no rubs murmurs or extra sounds Lungs: Clear to auscultation, breathing unlabored, no rales or wheezes Abdomen: Positive bowel sounds, soft nontender to palpation, nondistended Extremities: No clubbing, cyanosis, or edema Skin: No evidence of breakdown, no evidence of rash  Motor: LLE: Hip ext 2-/5, knee extension 2-/5, ankle dorsiflexion 0/5, Left plantar flexion 2-/5,  Left UE 2-/5 elbow flex/ext, trace finger flexion, unchanged  Assessment/Plan: 1. Functional deficits secondary to Right ICA distribution infarcts  which require 3+ hours per day of interdisciplinary therapy in a comprehensive inpatient rehab setting.  Physiatrist is providing close team supervision and 24 hour management of active medical problems listed below.  Physiatrist and rehab team continue to assess barriers to discharge/monitor patient progress toward functional and medical goals  Care Tool:  Bathing    Body parts bathed by patient: Front perineal area, Right upper leg, Left upper leg, Right lower leg, Left arm, Chest, Abdomen, Face   Body parts bathed by helper: Buttocks, Left lower leg, Right arm     Bathing assist Assist Level: Moderate Assistance - Patient  50 - 74%     Upper Body Dressing/Undressing Upper body dressing   What is the patient wearing?: Pull over shirt    Upper body assist Assist Level: Moderate Assistance - Patient 50 - 74%    Lower Body Dressing/Undressing Lower body dressing      What is the patient wearing?: Incontinence brief, Pants     Lower body assist Assist for lower body dressing: Maximal Assistance - Patient 25 - 49%     Toileting Toileting    Toileting assist Assist for toileting: 2 Helpers     Transfers Chair/bed transfer  Transfers assist     Chair/bed transfer assist level: Moderate Assistance - Patient 50 - 74% (stand pivot)     Locomotion Ambulation   Ambulation assist   Ambulation activity did not occur: Safety/medical concerns  Assist level: 2 helpers Naval architect) Assistive device:  (3 Musketeer) Max distance: 88ft   Walk 10 feet activity   Assist     Assist level: 2 helpers Assistive device: Other (comment) (3Musketeer)   Walk 50 feet activity   Assist    Assist level: 2 helpers Assistive device: Other (comment) (3 Musketeer)    Walk 150 feet activity   Assist           Walk 10 feet on uneven surface  activity   Assist Walk 10 feet on uneven surfaces activity did not occur: Safety/medical concerns         Wheelchair     Assist Will patient use wheelchair at discharge?: Yes Type of Wheelchair: Manual Wheelchair activity did not occur: Safety/medical concerns  Wheelchair assist level: Moderate Assistance - Patient 50 - 74% Max wheelchair distance: 50    Wheelchair 50 feet with 2 turns activity    Assist        Assist Level: Moderate Assistance - Patient 50 - 74%   Wheelchair 150 feet activity     Assist      Assist Level: Total Assistance - Patient < 25%   Blood pressure 115/70, pulse (!) 55, temperature 98.4 F (36.9 C), temperature source Oral, resp. rate 18, height 5\' 11"  (1.803 m), weight 102.7 kg, SpO2 100  %.   Medical Problem List and Plan: 1.  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  Status post right ICA revascularization with stent assisted angioplasty 11/21/2019  Continue CIR,  PT, OT, SLP ELOS 11/2 pt aware  WHO/PRAFO nightly 2.  Antithrombotics: -DVT/anticoagulation: Lovenox             -antiplatelet therapy: Aspirin 81 mg daily and Brilinta 90 mg twice daily 3. Pain Management: Well controlled 4. Mood: Provide emotional support             -antipsychotic agents: N/A 5. Neuropsych: This patient is capable of making decisions on his own behalf. 6. Skin/Wound Care: Routine skin checks 7. Fluids/Electrolytes/Nutrition: Routine in and outs 8.  Right medial sphenoid wing meningioma.  Followed by neurosurgery.  Decadron protocol.  No current indications for surgical intervention at this time Will wean 9.  Hypertension.  Norvasc 10 mg daily, Zaroxolyn 2.5 mg 1/day on Monday, Lopressor 12.5 mg twice daily, Imdur 120 mg daily.  Vitals:   12/13/19 1922 12/14/19 0551  BP: 110/70 115/70  Pulse: 65 (!) 55  Resp: 20 18  Temp: 98.1 F (36.7 C) 98.4 F (36.9 C)  SpO2: 99% 100%   Slightly soft on 10/18, will consider decreasing medications if persistent 10.  Diastolic congestive heart failure.  metoprolol, Lasix 40 mg twice daily.  Monitor for any signs of fluid overload  Lasix decreased to 20mg  BID  Daily weights  Filed Weights   12/10/19 0500 12/11/19 0500 12/12/19 0342  Weight: 105 kg 101.9 kg 102.7 kg   Stable on 10/19 11.  Hyperlipidemia.  Lipitor 12.  History of COPD/ tobacco abuse.  Counseling.  Albuterol inhaler as needed 13.  GERD.  Protonix 14.  Post stroke dysphagia.  Advanced to Dysphagia #3 thin liquids.  Follow-up speech therapy 15. Bradycardic:on BB monitor TID., D/C BB  , checked 10/8 EKG sinus brady rate 49 Vitals:    12/13/19 1922 12/14/19 0551  BP: 110/70 115/70  Pulse: 65 (!) 55  Resp: 20 18  Temp: 98.1 F (36.7 C) 98.4 F (36.9 C)  SpO2: 99% 100%   Controlled on 10/19 16.  Morbid obesity: Encourage weight loss 17.  RIght eye closure improved blepharitis+/- blepharospasm-able to open Wean  sulfacetamide and prednisolone drops to QID 18.  AKI  Creatinine 1.38 on 10/18  Encourage fluids  Continue to monitor 19.  Thrombocytopenia  Platelets 123 on 10/18, fairly stable at 116K 10/21  Repeat plt 10/25  may need to hold lovenox if dropping to <100K  LOS: 16 days A FACE TO [place] E Penda Venturi 12/14/2019, 7:46 AM

## 2019-12-15 ENCOUNTER — Inpatient Hospital Stay (HOSPITAL_COMMUNITY): Payer: No Typology Code available for payment source | Admitting: Physical Therapy

## 2019-12-15 ENCOUNTER — Inpatient Hospital Stay (HOSPITAL_COMMUNITY): Payer: No Typology Code available for payment source

## 2019-12-15 DIAGNOSIS — K5901 Slow transit constipation: Secondary | ICD-10-CM

## 2019-12-15 MED ORDER — POLYETHYLENE GLYCOL 3350 17 G PO PACK
17.0000 g | PACK | Freq: Every day | ORAL | Status: DC
  Administered 2019-12-17: 17 g via ORAL
  Filled 2019-12-15 (×5): qty 1

## 2019-12-15 NOTE — Progress Notes (Addendum)
[place] PHYSICAL MEDICINE & REHABILITATION PROGRESS NOTE   Subjective/Complaints: Patient seen sitting up in bed this AM, eating breakfast.  He states he slept well overnight.    ROS: Mild SOB. Denies CP, N/V/D  Objective:   No results found. Recent Labs    12/13/19 0601  PLT 116*   No results for input(s): NA, K, CL, CO2, GLUCOSE, BUN, CREATININE, CALCIUM in the last 72 hours.  Intake/Output Summary (Last 24 hours) at 12/15/2019 0835 Last data filed at 12/14/2019 2200 Gross per 24 hour  Intake 600 ml  Output 1 ml  Net 599 ml        Physical Exam: Vital Signs Blood pressure 115/66, pulse (!) 54, temperature 98.7 F (37.1 C), resp. rate 18, height 5\' 11"  (1.803 m), weight 105.2 kg, SpO2 98 %. Constitutional: No distress . Vital signs reviewed. HENT: Normocephalic.  Atraumatic. Eyes: EOMI. No discharge. Cardiovascular: No JVD.  RRR. Respiratory: Normal effort.  No stridor.  Bilateral clear to auscultation. GI: Non-distended.  BS +. Skin: Warm and dry.  Intact. Psych: Normal mood.  Normal behavior. Musc: No edema in extremities.  No tenderness in extremities. Neuro: Alert Motor: LLE: Hip ext 2-/5, knee extension 2-/5, ankle dorsiflexion 0/5 Left UE shoulder abduction, elbow flexion/extension 1+/5, distally 0/5  Assessment/Plan: 1. Functional deficits secondary to Right ICA distribution infarcts  which require 3+ hours per day of interdisciplinary therapy in a comprehensive inpatient rehab setting.  Physiatrist is providing close team supervision and 24 hour management of active medical problems listed below.  Physiatrist and rehab team continue to assess barriers to discharge/monitor patient progress toward functional and medical goals  Care Tool:  Bathing    Body parts bathed by patient: Front perineal area, Right upper leg, Left upper leg, Right lower leg, Left arm, Chest, Abdomen, Face   Body parts bathed by helper: Buttocks, Left lower leg, Right arm      Bathing assist Assist Level: Moderate Assistance - Patient 50 - 74%     Upper Body Dressing/Undressing Upper body dressing   What is the patient wearing?: Pull over shirt    Upper body assist Assist Level: Moderate Assistance - Patient 50 - 74%    Lower Body Dressing/Undressing Lower body dressing      What is the patient wearing?: Incontinence brief, Pants     Lower body assist Assist for lower body dressing: Maximal Assistance - Patient 25 - 49%     Toileting Toileting    Toileting assist Assist for toileting: 2 Helpers     Transfers Chair/bed transfer  Transfers assist     Chair/bed transfer assist level: Moderate Assistance - Patient 50 - 74%     Locomotion Ambulation   Ambulation assist   Ambulation activity did not occur: Safety/medical concerns  Assist level: 2 helpers ([place] + wc follow) Assistive device: Walker-rolling Max distance: 30   Walk 10 feet activity   Assist     Assist level: 2 helpers Assistive device: Other (comment) (3Musketeer)   Walk 50 feet activity   Assist    Assist level: 2 helpers Assistive device: Other (comment) (3 Musketeer)    Walk 150 feet activity   Assist           Walk 10 feet on uneven surface  activity   Assist Walk 10 feet on uneven surfaces activity did not occur: Safety/medical concerns         Wheelchair     Assist Will patient use wheelchair at discharge?: Yes  Type of Wheelchair: Manual Wheelchair activity did not occur: Safety/medical concerns  Wheelchair assist level: Moderate Assistance - Patient 50 - 74% Max wheelchair distance: 50    Wheelchair 50 feet with 2 turns activity    Assist        Assist Level: Moderate Assistance - Patient 50 - 74%   Wheelchair 150 feet activity     Assist      Assist Level: Total Assistance - Patient < 25%   Blood pressure 115/66, pulse (!) 54, temperature 98.7 F (37.1 C), resp. rate 18, height 5\' 11"  (1.803 m), weight  105.2 kg, SpO2 98 %.   Medical Problem List and Plan: 1.  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  Status post right ICA revascularization with stent assisted angioplasty 11/21/2019  Continue CIR  WHO/PRAFO nightly, PRAFO not seen in room 2.  Antithrombotics: -DVT/anticoagulation: Lovenox             -antiplatelet therapy: Aspirin 81 mg daily and Brilinta 90 mg twice daily 3. Pain Management: prn meds 4. Mood: Provide emotional support             -antipsychotic agents: N/A 5. Neuropsych: This patient is capable of making decisions on his own behalf. 6. Skin/Wound Care: Routine skin checks 7. Fluids/Electrolytes/Nutrition: Routine in and outs 8.  Right medial sphenoid wing meningioma.  Followed by neurosurgery.  Decadron protocol.  No current indications for surgical intervention at this time  Continue to wean 9.  Hypertension.  Norvasc 10 mg daily, Zaroxolyn 2.5 mg 1/day on Monday, Lopressor 12.5 mg twice daily, Imdur 120 mg daily.  Vitals:   12/14/19 1911 12/15/19 0432  BP: 98/62 115/66  Pulse: 71 (!) 54  Resp: 18 18  Temp: 99.9 F (37.7 C) 98.7 F (37.1 C)  SpO2: 94% 98%   Relatively controlled on 10/23 10.  Diastolic congestive heart failure.  metoprolol, Lasix 40 mg twice daily.  Monitor for any signs of fluid overload  Lasix decreased to 20mg  BID  Daily weights  Filed Weights   12/11/19 0500 12/12/19 0342 12/15/19 0500  Weight: 101.9 kg 102.7 kg 105.2 kg   ?  Reliability on 10/23 11.  Hyperlipidemia.  Lipitor 12.  History of COPD/ tobacco abuse.  Counseling.  Albuterol inhaler as needed 13.  GERD.  Protonix 14.  Post stroke dysphagia.  Advanced to regular thins with supervision.  Follow-up speech therapy 15. Bradycardic:on BB monitor TID., D/Ced BB  , checked 10/8 EKG sinus brady rate 49 Vitals:   12/14/19 1911  12/15/19 0432  BP: 98/62 115/66  Pulse: 71 (!) 54  Resp: 18 18  Temp: 99.9 F (37.7 C) 98.7 F (37.1 C)  SpO2: 94% 98%   Labile on 10/23 16.  Morbid obesity: Encourage weight loss 17.  RIght eye closure improved blepharitis+/- blepharospasm-able to open  Wean  sulfacetamide and prednisolone drops to QID 18.  AKI  Creatinine 1.38 on 10/18, labs ordered for Monday  Encourage fluids  Continue to monitor 19.  Thrombocytopenia  Platelets 116 on 10/21, trending down, labs ordered for Monday 20.  Slow transit constipation  Bowel meds ordered on 10/23  LOS: 17 days A FACE TO FACE EVALUATION WAS PERFORMED  Colby Catanese Lorie Phenix 12/15/2019, 8:35 AM

## 2019-12-15 NOTE — Progress Notes (Signed)
Physical Therapy Session Note  Patient Details  Name: Charles Sherman MRN: [id number] Date of Birth: February 26, 1945  Today's Date: 12/15/2019 PT Individual Time: 4268-3419 PT Individual Time Calculation (min): 42 min   Short Term Goals: Week 3:  PT Short Term Goal 1 (Week 3): STG=LTG based on ELOS  Skilled Therapeutic Interventions/Progress Updates:    Pt received supine in bed and agreeable to therapy session - reports he has not been OOB yet today. Supine>sitting R EOB, HOB elevated and using R UE support on bedrail, with min assist for L UE management, trunk upright, and pivoting L hip towards EOB - cuing for sequencing to increase pt independence. Pt continues to demo R gaze preference with L hemibody and environment inattention. Sitting EOB with close supervision and intermittent CGA/min assist for trunk control due to progressive posterior lean with L bias while donning pants and shoes max assist. Sit>stand from EOB using R UE on bedrail with min/mod assist for lifting into standing and mod assist for balance due to L lean while pulling up pants with min assist. R stand pivot EOB>w/c with mod assist for balance due to L lean - pt demos L posterior trunk LOB when going to sit requiring max assist for safety. Educated and attempted R hemi-technique w/c propulsion; however, pt having significant motor planning challenges coordinating UE and LE movement at same time - transitioned to just R LE propulsion ~164ft with mod assist for forward movement. Attempted L stand pivot w/c>EOM; however, pt with heavy L lean requiring max assist to safely return to sitting in w/c and prevent LOB. Transitioned to L squat pivot with max assist of 1 and +2 mod assist for lifting and pivoting hips due to continued strong L lean and then requiring mod assist for trunk control to prevent L posterior lean once on mat. Sitting on mat able to maintain midline with close supervision. Sit<>stands using RW with min/mod assist  for lifting and balance in standing due to L lean - provided mirror feedback for improved midline orientation with impaired motor planning to correct L lean and lacking L LE hip/knee extension (facilitating and max cuing for improvement with poor motor recruitment of quads and glutes). Standing with RW, L LE NMR focusing on stance control via R LE stepping forwards/backwards with min/mod assist for balance and continued facilitation and cuing for L hip/knee extension - once stepping R foot back pt has L lean/LOB requiring cuing for awareness of this and for correction each repetition. R stand pivot EOM>w/c with mod assist for balance as described above. Transported back to room. R stand pivot w/c>TIS w/c to allow increased upright, OOB activity tolerance with pt requiring 2 attempts to perform transfer safely because 1st time pt had strong L lean requiring max assist to return safely back to sitting in w/c to prevent LOB. Pt left tilted back in TIS w/c with needs in reach and NT aware of pt's position.  Therapy Documentation Precautions:  Precautions Precautions: Fall Precaution Comments: left neglect, left hemiparesis, slight pusher to the left Restrictions Weight Bearing Restrictions: No   Pain: No reports of pain throughout session.   Therapy/Group: Individual Therapy  [name] , PT, DPT, CSRS  12/15/2019, 12:36 PM

## 2019-12-15 NOTE — Progress Notes (Signed)
Speech Language Pathology Daily Session Note  Patient Details  Name: Charles Sherman MRN: [id number] Date of Birth: 1945/08/16  Today's Date: 12/15/2019 SLP Individual Time: 1884-1660 SLP Individual Time Calculation (min): 45 min  Short Term Goals: Week 3: SLP Short Term Goal 1 (Week 3): Pt will consume current regulat texture thin liquid diet Mod I for use of swallow strategies for oral clearance of left pocketing to increase independence with strategies and work toward decreasing supervision requirement during meals. SLP Short Term Goal 2 (Week 3): Pt will demonstrate ability to problem solve mildly complex tasks with Min A verbal/visual cues. SLP Short Term Goal 3 (Week 3): Pt will detect functional errors with Min A verbal/visual cues. SLP Short Term Goal 4 (Week 3): Pt will scan left visual field or attend to objects in left side of environment in 5/10 opportunities with Mod A multimodal cues. SLP Short Term Goal 5 (Week 3): Pt will selectively attend to functional tasks with Supervision A verbal and visual cues.  Skilled Therapeutic Interventions: Skilled SLP intervention focused on cognition and swallowing. Pt very sleepy at beginning of session but remained alert and participated well. Pt seen with regular snack and thin liquids. Extended time with mastication of graham crackers but left buccal cavity was clear after swallow. Pt educated on using tongue to check oral cavity and clear any remaining residue during meals. Left inattention task completed with cards scattered on table with mod A verbal cues to turn head left. Increased accuracy with locating cards in left visual field with verbal cueing. Moderate assistance with verbal cues needed to scan far left to read 2-3 sentence paragraph in large print. Pt left upright in bed with call bell within reach and bed alarm set. Cont with therapy per plan of care.      Pain Pain Assessment Pain Scale: Faces Faces Pain Scale: No  hurt  Therapy/Group: Individual Therapy  Darrol Poke Teletha Petrea 12/15/2019, 10:22 AM

## 2019-12-16 NOTE — Progress Notes (Signed)
[facility] PHYSICAL MEDICINE & REHABILITATION PROGRESS NOTE   Subjective/Complaints: Patient seen laying in bed this morning.  He states he slept well overnight.  He denies complaints.  ROS: Denies CP, shortness of breath, N/V/D  Objective:   No results found. No results for input(s): WBC, HGB, HCT, PLT in the last 72 hours. No results for input(s): NA, K, CL, CO2, GLUCOSE, BUN, CREATININE, CALCIUM in the last 72 hours.  Intake/Output Summary (Last 24 hours) at 12/16/2019 0819 Last data filed at 12/16/2019 0700 Gross per 24 hour  Intake 900 ml  Output --  Net 900 ml        Physical Exam: Vital Signs Blood pressure 122/60, pulse 64, temperature 98.1 F (36.7 C), resp. rate 16, height 5\' 11"  (1.803 m), weight 101.2 kg, SpO2 98 %. Constitutional: No distress . Vital signs reviewed. HENT: Normocephalic.  Atraumatic. Eyes: EOMI. No discharge. Cardiovascular: No JVD.  RRR. Respiratory: Normal effort.  No stridor.  Bilateral clear to auscultation. GI: Non-distended.  BS +. Skin: Warm and dry.  Intact. Psych: Normal mood.  Normal behavior. Musc: No edema in extremities.  No tenderness in extremities. Neuro: Alert Motor: LLE: Hip ext 2-/5, knee extension 2-/5, ankle dorsiflexion 0/5, improving Left UE shoulder abduction, elbow flexion/extension 1+/5, distally 0/5  Assessment/Plan: 1. Functional deficits secondary to Right ICA distribution infarcts  which require 3+ hours per day of interdisciplinary therapy in a comprehensive inpatient rehab setting.  Physiatrist is providing close team supervision and 24 hour management of active medical problems listed below.  Physiatrist and rehab team continue to assess barriers to discharge/monitor patient progress toward functional and medical goals  Care Tool:  Bathing    Body parts bathed by patient: Front perineal area, Right upper leg, Left upper leg, Right lower leg, Left arm, Chest, Abdomen, Face   Body parts bathed by helper:  Buttocks, Left lower leg, Right arm     Bathing assist Assist Level: Moderate Assistance - Patient 50 - 74%     Upper Body Dressing/Undressing Upper body dressing   What is the patient wearing?: Pull over shirt    Upper body assist Assist Level: Moderate Assistance - Patient 50 - 74%    Lower Body Dressing/Undressing Lower body dressing      What is the patient wearing?: Incontinence brief, Pants     Lower body assist Assist for lower body dressing: Maximal Assistance - Patient 25 - 49%     Toileting Toileting    Toileting assist Assist for toileting: 2 Helpers     Transfers Chair/bed transfer  Transfers assist     Chair/bed transfer assist level: Moderate Assistance - Patient 50 - 74%     Locomotion Ambulation   Ambulation assist   Ambulation activity did not occur: Safety/medical concerns  Assist level: 2 helpers ([place] + wc follow) Assistive device: Walker-rolling Max distance: 30   Walk 10 feet activity   Assist     Assist level: 2 helpers Assistive device: Other (comment) (3Musketeer)   Walk 50 feet activity   Assist    Assist level: 2 helpers Assistive device: Other (comment) (3 Musketeer)    Walk 150 feet activity   Assist           Walk 10 feet on uneven surface  activity   Assist Walk 10 feet on uneven surfaces activity did not occur: Safety/medical concerns         Wheelchair     Assist Will patient use wheelchair at discharge?: Yes  Type of Wheelchair: Manual Wheelchair activity did not occur: Safety/medical concerns  Wheelchair assist level: Moderate Assistance - Patient 50 - 74% Max wheelchair distance: 139ft    Wheelchair 50 feet with 2 turns activity    Assist        Assist Level: Moderate Assistance - Patient 50 - 74%   Wheelchair 150 feet activity     Assist      Assist Level: Moderate Assistance - Patient 50 - 74%, Maximal Assistance - Patient 25 - 49%   Blood pressure 122/60,  pulse 64, temperature 98.1 F (36.7 C), resp. rate 16, height 5\' 11"  (1.803 m), weight 101.2 kg, SpO2 98 %.   Medical Problem List and Plan: 1.  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  Status post right ICA revascularization with stent assisted angioplasty 11/21/2019  Continue CIR  WHO/PRAFO nightly, PRAFO not seen in room 2.  Antithrombotics: -DVT/anticoagulation: Lovenox             -antiplatelet therapy: Aspirin 81 mg daily and Brilinta 90 mg twice daily 3. Pain Management: prn meds 4. Mood: Provide emotional support             -antipsychotic agents: N/A 5. Neuropsych: This patient is capable of making decisions on his own behalf. 6. Skin/Wound Care: Routine skin checks 7. Fluids/Electrolytes/Nutrition: Routine in and outs 8.  Right medial sphenoid wing meningioma.  Followed by neurosurgery.  Decadron protocol.  No current indications for surgical intervention at this time  Continue to wean 9.  Hypertension.  Norvasc 10 mg daily, Zaroxolyn 2.5 mg 1/day on Monday, Lopressor 12.5 mg twice daily, Imdur 120 mg daily.  Vitals:   12/15/19 1859 12/16/19 0321  BP: (!) 106/53 122/60  Pulse: 60 64  Resp: 17 16  Temp: 99.8 F (37.7 C) 98.1 F (36.7 C)  SpO2: 100% 98%   Relatively controlled on 10/24 10.  Diastolic congestive heart failure.  metoprolol, Lasix 40 mg twice daily.  Monitor for any signs of fluid overload  Lasix decreased to 20mg  BID  Daily weights  Filed Weights   12/15/19 0500 12/15/19 0848 12/16/19 0321  Weight: 105.2 kg 101.2 kg 101.2 kg   Stable on 10/24 11.  Hyperlipidemia.  Lipitor 12.  History of COPD/ tobacco abuse.  Counseling.  Albuterol inhaler as needed 13.  GERD.  Protonix 14.  Post stroke dysphagia.  Advanced to regular thins with supervision.  Follow-up speech therapy 15. Bradycardic:on BB monitor  TID., D/Ced BB  , checked 10/8 EKG sinus brady rate 49 Vitals:   12/15/19 1859 12/16/19 0321  BP: (!) 106/53 122/60  Pulse: 60 64  Resp: 17 16  Temp: 99.8 F (37.7 C) 98.1 F (36.7 C)  SpO2: 100% 98%   Relatively controlled on 10/24 16.  Morbid obesity: Encourage weight loss 17.  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  AKI  Creatinine 1.38 on 10/18, labs ordered for tomorrow  Encourage fluids  Continue to monitor 19.  Thrombocytopenia  Platelets 116 on 10/21, trending down, labs ordered for tomorrow 20.  Slow transit constipation  Bowel meds ordered on 10/23  Improving  LOS: 18 days A FACE TO FACE EVALUATION WAS PERFORMED  Charles Sherman Lorie Phenix 12/16/2019, 8:19 AM

## 2019-12-17 ENCOUNTER — Inpatient Hospital Stay (HOSPITAL_COMMUNITY): Payer: No Typology Code available for payment source | Admitting: Physical Therapy

## 2019-12-17 ENCOUNTER — Inpatient Hospital Stay (HOSPITAL_COMMUNITY): Payer: No Typology Code available for payment source

## 2019-12-17 ENCOUNTER — Inpatient Hospital Stay (HOSPITAL_COMMUNITY): Payer: No Typology Code available for payment source | Admitting: Occupational Therapy

## 2019-12-17 LAB — BASIC METABOLIC PANEL
Anion gap: 7 (ref 5–15)
BUN: 23 mg/dL (ref 8–23)
CO2: 31 mmol/L (ref 22–32)
Calcium: 9.8 mg/dL (ref 8.9–10.3)
Chloride: 104 mmol/L (ref 98–111)
Creatinine, Ser: 1.38 mg/dL — ABNORMAL HIGH (ref 0.61–1.24)
GFR, Estimated: 53 mL/min — ABNORMAL LOW (ref >60)
Glucose, Bld: 126 mg/dL — ABNORMAL HIGH (ref 70–99)
Potassium: 3.8 mmol/L (ref 3.5–5.1)
Sodium: 142 mmol/L (ref 135–145)

## 2019-12-17 LAB — CBC WITH DIFFERENTIAL/PLATELET
Abs Immature Granulocytes: 0.03 10*3/uL (ref 0.00–0.07)
Basophils Absolute: 0 10*3/uL (ref 0.0–0.1)
Basophils Relative: 0 %
Eosinophils Absolute: 0.1 10*3/uL (ref 0.0–0.5)
Eosinophils Relative: 1 %
HCT: 39.8 % (ref 39.0–52.0)
Hemoglobin: 13.1 g/dL (ref 13.0–17.0)
Immature Granulocytes: 0 %
Lymphocytes Relative: 25 %
Lymphs Abs: 1.8 10*3/uL (ref 0.7–4.0)
MCH: 32.1 pg (ref 26.0–34.0)
MCHC: 32.9 g/dL (ref 30.0–36.0)
MCV: 97.5 fL (ref 80.0–100.0)
Monocytes Absolute: 0.6 10*3/uL (ref 0.1–1.0)
Monocytes Relative: 9 %
Neutro Abs: 4.4 10*3/uL (ref 1.7–7.7)
Neutrophils Relative %: 65 %
Platelets: 151 10*3/uL (ref 150–400)
RBC: 4.08 MIL/uL — ABNORMAL LOW (ref 4.22–5.81)
RDW: 14.6 % (ref 11.5–15.5)
WBC: 6.9 10*3/uL (ref 4.0–10.5)
nRBC: 0 % (ref 0.0–0.2)

## 2019-12-17 NOTE — Progress Notes (Signed)
Physical Therapy Session Note  Patient Details  Name: Charles Sherman MRN: [id number] Date of Birth: 1945-08-31  Today's Date: 12/17/2019 PT Individual Time: 3536-1443 PT Individual Time Calculation (min): 72 min   Short Term Goals: Week 3:  PT Short Term Goal 1 (Week 3): STG=LTG based on ELOS  Skilled Therapeutic Interventions/Progress Updates:    Patient received supine in bed, agreeable to PT. He denies pain. Patient found to have been incontinent of bladder requiring MaxA for lower body dressing and peri-hygiene. Patient able to wash front of body with washcloth and verbal cuing to attend to L side. MinA supine > sit, MaxA stand pivot to wc leading L. Patient able to complete sit to stand with RW + modified hand grip and ModA x1. He ambulated 1ft with RW, modified hand grip + ModA x1 (MaxA for turns) and wc follow. Patient then ambulating 18ft with RW, modified hand grip + ModA and wc follow. Initially patient was MinA with verbal cues for weight shifts progressing to [place] with verbal cues for weight shifts due to fatigue. Patient able to complete sit to stand with RW, modified hand grip and MinA with mirror for visual feedback x8. Patient with initial pushing/deviation L, but with visualization, he's able to correct back to midline and maintain this posture. Stand pivot to therapy mat with ModA and verbal cues for safety. He was able to complete seated reaching task outside BOS to match playing cards with Min verbal cuing for accuracy. Patient completing the same task in standing with RW and up to [facility] for postural stability. Patient without mirror for visual feedback for this task and persistent L lateral lean noted whenever R hand began dynamic task. Patient initially able to regain midline, but would fatigue back to L lateral lean as he began to prioritize cognitive task. Patient noting episode of bladder incontinence in standing. Transferring back to wc via stand pivot with ModA and then  to bed via stand pivot with ModA. MaxA for peri care and lower body dressing. Patient remaining in bed, bed alarm on, call light within reach.   Therapy Documentation Precautions:  Precautions Precautions: Fall Precaution Comments: left neglect, left hemiparesis, slight pusher to the left Restrictions Weight Bearing Restrictions: No    Therapy/Group: Individual Therapy  Karoline Caldwell, PT, DPT, CBIS 12/17/2019, 7:44 AM

## 2019-12-17 NOTE — Progress Notes (Signed)
Speech Language Pathology Daily Session Note  Patient Details  Name: Charles Sherman MRN: [id number] Date of Birth: [birth year]  Today's Date: 12/17/2019 SLP Individual Time: 1103-1202 SLP Individual Time Calculation (min): 59 min  Short Term Goals: Week 3: SLP Short Term Goal 1 (Week 3): Pt will consume current regulat texture thin liquid diet Mod I for use of swallow strategies for oral clearance of left pocketing to increase independence with strategies and work toward decreasing supervision requirement during meals. SLP Short Term Goal 2 (Week 3): Pt will demonstrate ability to problem solve mildly complex tasks with Min A verbal/visual cues. SLP Short Term Goal 3 (Week 3): Pt will detect functional errors with Min A verbal/visual cues. SLP Short Term Goal 4 (Week 3): Pt will scan left visual field or attend to objects in left side of environment in 5/10 opportunities with Mod A multimodal cues. SLP Short Term Goal 5 (Week 3): Pt will selectively attend to functional tasks with Supervision A verbal and visual cues.  Skilled Therapeutic Interventions:Skilled ST services focused on cognitive skills. SLP had planned PO consumption of lunch tray and carry over of swallow strategies, however early tray requested did not arrive in time. SLP facilitated basic problem solving, and scanning left of midline skills in PEG design task requiring mod A verbal cues for problem solving, Max A verbal cues for error awareness and max A fade to mod A verbal cues to scan left. Pt demonstrated basic problem solving and scanning left of midline in familiar card task, WAR, pt demonstrated Mod I for problem solving and supervision A verbal cues to scan left of midline. Pt requested to use the bathroom and SLP and NT assisted via stedy and transfer into bed. Pt was left in room with call bell within reach and bed alarm set. SLP recommends to continue skilled services.     Pain Pain Assessment Pain Score: 0-No  pain  Therapy/Group: Individual Therapy  Charles Sherman  [facility] 12/17/2019, 4:40 PM

## 2019-12-17 NOTE — Progress Notes (Signed)
Patient ID: Charles Sherman, male   DOB: 07/03/1945, 74 y.o.   MRN: [id number]   SW attempted to call spouse to schedule family education, no answer. Left voicemail.

## 2019-12-17 NOTE — Progress Notes (Signed)
Occupational Therapy Session Note  Patient Details  Name: Charles Sherman MRN: [id number] Date of Birth: [birth year]  Today's Date: 12/17/2019 OT Individual Time: 0900-1000 OT Individual Time Calculation (min): 60 min    Short Term Goals: Week 2:  OT Short Term Goal 1 (Week 2): Pt will completed UB dressing with min assist following hemi dressing techiques. OT Short Term Goal 1 - Progress (Week 2): Not met OT Short Term Goal 2 (Week 2): Pt will complete LB bathing with mod assist sit to stand. OT Short Term Goal 2 - Progress (Week 2): Met OT Short Term Goal 3 (Week 2): Pt will complete LB dressing with mod assist for brief and pants only. OT Short Term Goal 3 - Progress (Week 2): Met OT Short Term Goal 4 (Week 2): Pt will scan left of midline with no more than min instructional cueing to locate selfcare items during bathing tasks. OT Short Term Goal 4 - Progress (Week 2): Not met  Skilled Therapeutic Interventions/Progress Updates:    Patient in bed, alert and ready for therapy session.  Supine to sitting edge of bed with min A.  Min cues to attend to left side and maintain midline in unsupported sitting.  Max A sit pivot transfer bed to w/c.  Completed bathing and grooming tasks w/c level - set up for oral care and washing face.  Mod A for UB bathing and donning [place] shirt with moderate cues, LB bathing mod A.  Max A to donn incontinence brief and pants, dependent for footwear.  Completed washing of buttocks/groin and clothing management in stance with right UB support using bed rail mod/max A and cues for midline awareness.   Completed left UE AAROM shoulder to hand with cues to maintain activity/attend to left side.  He remained seated in TIS w/c in tilted position at close of session, seat belt alarm set and call bell/tray table in reach.    Therapy Documentation Precautions:  Precautions Precautions: Fall Precaution Comments: left neglect, left hemiparesis, slight pusher to the  left Restrictions Weight Bearing Restrictions: No  Therapy/Group: Individual Therapy  [name] 12/17/2019, 7:33 AM

## 2019-12-17 NOTE — Progress Notes (Signed)
PHYSICAL MEDICINE & REHABILITATION PROGRESS NOTE   Subjective/Complaints:   No issues overnite, drinking fluids, discussed bloodwork and D/C date   ROS: Denies CP, shortness of breath, N/V/D  Objective:   No results found. No results for input(s): WBC, HGB, HCT, PLT in the last 72 hours. No results for input(s): NA, K, CL, CO2, GLUCOSE, BUN, CREATININE, CALCIUM in the last 72 hours.  Intake/Output Summary (Last 24 hours) at 12/17/2019 0823 Last data filed at 12/17/2019 0811 Gross per 24 hour  Intake 620 ml  Output --  Net 620 ml        Physical Exam: Vital Signs Blood pressure 129/72, pulse 61, temperature 98.2 F (36.8 C), temperature source Oral, resp. rate 18, height 5\' 11"  (1.803 m), weight 102.6 kg, SpO2 97 %.  General: No acute distress Mood and affect are appropriate Heart: Regular rate and rhythm no rubs murmurs or extra sounds Lungs: Clear to auscultation, breathing unlabored, no rales or wheezes Abdomen: Positive bowel sounds, soft nontender to palpation, nondistended Extremities: No clubbing, cyanosis, or edema Skin: No evidence of breakdown, no evidence of rash   Neuro: Alert Motor: LLE: Hip ext 2-/5, knee extension 2-/5, ankle dorsiflexion 0/5, improving Left UE shoulder abduction, elbow flexion/extension 1+/5, distally 0/5  Assessment/Plan: 1. Functional deficits secondary to Right ICA distribution infarcts  which require 3+ hours per day of interdisciplinary therapy in a comprehensive inpatient rehab setting.  Physiatrist is providing close team supervision and 24 hour management of active medical problems listed below.  Physiatrist and rehab team continue to assess barriers to discharge/monitor patient progress toward functional and medical goals  Care Tool:  Bathing    Body parts bathed by patient: Front perineal area, Right upper leg, Left upper leg, Right lower leg, Left arm, Chest, Abdomen, Face   Body parts bathed by helper:  Buttocks, Left lower leg, Right arm     Bathing assist Assist Level: Moderate Assistance - Patient 50 - 74%     Upper Body Dressing/Undressing Upper body dressing   What is the patient wearing?: Pull over shirt    Upper body assist Assist Level: Moderate Assistance - Patient 50 - 74%    Lower Body Dressing/Undressing Lower body dressing      What is the patient wearing?: Incontinence brief, Pants     Lower body assist Assist for lower body dressing: Maximal Assistance - Patient 25 - 49%     Toileting Toileting    Toileting assist Assist for toileting: 2 Helpers     Transfers Chair/bed transfer  Transfers assist     Chair/bed transfer assist level: Moderate Assistance - Patient 50 - 74%     Locomotion Ambulation   Ambulation assist   Ambulation activity did not occur: Safety/medical concerns  Assist level: 2 helpers ([place] + wc follow) Assistive device: Walker-rolling Max distance: 30   Walk 10 feet activity   Assist     Assist level: 2 helpers Assistive device: Other (comment) (3Musketeer)   Walk 50 feet activity   Assist    Assist level: 2 helpers Assistive device: Other (comment) (3 Musketeer)    Walk 150 feet activity   Assist           Walk 10 feet on uneven surface  activity   Assist Walk 10 feet on uneven surfaces activity did not occur: Safety/medical concerns         Wheelchair     Assist Will patient use wheelchair at discharge?: Yes Type of Wheelchair:  Manual Wheelchair activity did not occur: Safety/medical concerns  Wheelchair assist level: Moderate Assistance - Patient 50 - 74% Max wheelchair distance: 121ft    Wheelchair 50 feet with 2 turns activity    Assist        Assist Level: Moderate Assistance - Patient 50 - 74%   Wheelchair 150 feet activity     Assist      Assist Level: Moderate Assistance - Patient 50 - 74%, Maximal Assistance - Patient 25 - 49%   Blood pressure 129/72,  pulse 61, temperature 98.2 F (36.8 C), temperature source Oral, resp. rate 18, height 5\' 11"  (1.803 m), weight 102.6 kg, SpO2 97 %.   Medical Problem List and Plan: 1.  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  Status post right ICA revascularization with stent assisted angioplasty 11/21/2019  Continue CIR  WHO/PRAFO nightly, PRAFO not seen in room 2.  Antithrombotics: -DVT/anticoagulation: Lovenox plt pnd             -antiplatelet therapy: Aspirin 81 mg daily and Brilinta 90 mg twice daily 3. Pain Management: prn meds 4. Mood: Provide emotional support             -antipsychotic agents: N/A 5. Neuropsych: This patient is capable of making decisions on his own behalf. 6. Skin/Wound Care: Routine skin checks 7. Fluids/Electrolytes/Nutrition: Routine in and outs 8.  Right medial sphenoid wing meningioma.  Followed by neurosurgery.  Decadron protocol.  No current indications for surgical intervention at this time  Continue to wean 9.  Hypertension.  Norvasc 10 mg daily, Zaroxolyn 2.5 mg 1/day on Monday, Lopressor 12.5 mg twice daily, Imdur 120 mg daily.  Vitals:   12/16/19 1942 12/17/19 0444  BP: 113/62 129/72  Pulse: 69 61  Resp: 18 18  Temp: 98.4 F (36.9 C) 98.2 F (36.8 C)  SpO2: 98% 97%   Relatively controlled on 10/25 10.  Diastolic congestive heart failure.  metoprolol, Lasix 40 mg twice daily.  Monitor for any signs of fluid overload  Lasix decreased to 20mg  BID  Daily weights  Filed Weights   12/15/19 0848 12/16/19 0321 12/17/19 0444  Weight: 101.2 kg 101.2 kg 102.6 kg   Stable on 10/24 11.  Hyperlipidemia.  Lipitor 12.  History of COPD/ tobacco abuse.  Counseling.  Albuterol inhaler as needed 13.  GERD.  Protonix 14.  Post stroke dysphagia.  Advanced to regular thins with supervision.  Follow-up speech therapy 15.  Bradycardic:on BB monitor TID., D/Ced BB  , checked 10/8 EKG sinus brady rate 49 Vitals:   12/16/19 1942 12/17/19 0444  BP: 113/62 129/72  Pulse: 69 61  Resp: 18 18  Temp: 98.4 F (36.9 C) 98.2 F (36.8 C)  SpO2: 98% 97%   Relatively controlled on 10/25 16.  Morbid obesity: Encourage weight loss 17.  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  AKI  Creatinine 1.38 on 10/18, labs ordered for tomorrow  Encourage fluids  Continue to monitor 19.  Thrombocytopenia  Platelets 116 on 10/21, trending down, labs ordered for tomorrow 20.  Slow transit constipation  Bowel meds ordered on 10/23  Improving  LOS: 19 days A FACE TO FACE EVALUATION WAS PERFORMED  Charlett Blake 12/17/2019, 8:23 AM

## 2019-12-18 ENCOUNTER — Inpatient Hospital Stay (HOSPITAL_COMMUNITY): Payer: No Typology Code available for payment source | Admitting: Occupational Therapy

## 2019-12-18 ENCOUNTER — Inpatient Hospital Stay (HOSPITAL_COMMUNITY): Payer: No Typology Code available for payment source | Admitting: Physical Therapy

## 2019-12-18 ENCOUNTER — Inpatient Hospital Stay (HOSPITAL_COMMUNITY): Payer: No Typology Code available for payment source

## 2019-12-18 NOTE — Progress Notes (Signed)
Patient ID: Charles Sherman, male   DOB: January 15, 1946, 74 y.o.   MRN: [id number]  Family education scheduled 10/28-10/30 I-3PM.

## 2019-12-18 NOTE — Progress Notes (Signed)
Patient ID: Charles Sherman, male   DOB: 10-04-1945, 74 y.o.   MRN: [id number]   Wheelchair order through [place] cancelled per therapy request. Patient having [place] eval.   Erlene Quan, [place]

## 2019-12-18 NOTE — Progress Notes (Signed)
Patient ID: Charles Sherman, male   DOB: January 09, 1946, 74 y.o.   MRN: [id number] Met with the patient to review role of the nurse CM and collaboration with SW to prepare for discharge. Reviewed secondary risk factors and stroke prevention tips including HF, HTN and HLD. Patient given information on HF, DASH diet, cooking with less salt and ASA and your heart. Patient noted his wife is a diabetic, bakes and broils foods and they also have a scale to monitor his weight at discharge. Continue to follow along to discharge and monitor for questions or educational needs. [name]

## 2019-12-18 NOTE — Progress Notes (Signed)
Patient ID: Charles Sherman, male   DOB: [birth year], 74 y.o.   MRN: [id number]   Orders faxed to [facility] for ramp, walk-in shower and wheelchair with gel cushion.  [place], [place]

## 2019-12-18 NOTE — Progress Notes (Signed)
Patient ID: Charles Sherman, male   DOB: 1945-07-28, 74 y.o.   MRN: [id number]  SW attempted to reach SW [name] at [place]. Reports still being out until tomorrow.  [place], [place]

## 2019-12-18 NOTE — Progress Notes (Addendum)
PHYSICAL MEDICINE & REHABILITATION PROGRESS NOTE   Subjective/Complaints:  Discussed need for family training prior to D/C Labs reviewed , pt asking for Covid booster , states he had last vaccine dose 4/21  ROS: Denies CP, shortness of breath, N/V/D  Objective:   No results found. Recent Labs    12/17/19 1804  WBC 6.9  HGB 13.1  HCT 39.8  PLT 151   Recent Labs    12/17/19 1804  NA 142  K 3.8  CL 104  CO2 31  GLUCOSE 126*  BUN 23  CREATININE 1.38*  CALCIUM 9.8    Intake/Output Summary (Last 24 hours) at 12/18/2019 0802 Last data filed at 12/17/2019 1835 Gross per 24 hour  Intake 654 ml  Output --  Net 654 ml        Physical Exam: Vital Signs Blood pressure 117/61, pulse (!) 53, temperature 98.2 F (36.8 C), temperature source Oral, resp. rate 16, height 5\' 11"  (1.803 m), weight 102.8 kg, SpO2 97 %.  General: No acute distress Mood and affect are appropriate Heart: Regular rate and rhythm no rubs murmurs or extra sounds Lungs: Clear to auscultation, breathing unlabored, no rales or wheezes Abdomen: Positive bowel sounds, soft nontender to palpation, nondistended Extremities: No clubbing, cyanosis, or edema Skin: No evidence of breakdown, no evidence of rash   Neuro: Alert Motor: LLE: Hip ext 2-/5, knee extension 3-/5, ankle dorsiflexion 0/5, improving Left UE shoulder abduction, elbow flexion/extension 2-, grip 2-  Assessment/Plan: 1. Functional deficits secondary to Right ICA distribution infarcts  which require 3+ hours per day of interdisciplinary therapy in a comprehensive inpatient rehab setting.  Physiatrist is providing close team supervision and 24 hour management of active medical problems listed below.  Physiatrist and rehab team continue to assess barriers to discharge/monitor patient progress toward functional and medical goals  Care Tool:  Bathing    Body parts bathed by patient: Front perineal area, Right upper leg, Left  upper leg, Right lower leg, Left arm, Chest, Abdomen, Face   Body parts bathed by helper: Buttocks, Left lower leg, Right arm     Bathing assist Assist Level: Moderate Assistance - Patient 50 - 74%     Upper Body Dressing/Undressing Upper body dressing   What is the patient wearing?: Pull over shirt    Upper body assist Assist Level: Moderate Assistance - Patient 50 - 74%    Lower Body Dressing/Undressing Lower body dressing      What is the patient wearing?: Incontinence brief, Pants     Lower body assist Assist for lower body dressing: Maximal Assistance - Patient 25 - 49%     Toileting Toileting    Toileting assist Assist for toileting: 2 Helpers     Transfers Chair/bed transfer  Transfers assist     Chair/bed transfer assist level: Moderate Assistance - Patient 50 - 74%     Locomotion Ambulation   Ambulation assist   Ambulation activity did not occur: Safety/medical concerns  Assist level: Moderate Assistance - Patient 50 - 74% Assistive device: Walker-rolling Max distance: 75   Walk 10 feet activity   Assist     Assist level: Minimal Assistance - Patient > 75% Assistive device: Walker-rolling   Walk 50 feet activity   Assist    Assist level: Moderate Assistance - Patient - 50 - 74% Assistive device: Walker-rolling    Walk 150 feet activity   Assist           Walk 10 feet on  uneven surface  activity   Assist Walk 10 feet on uneven surfaces activity did not occur: Safety/medical concerns         Wheelchair     Assist Will patient use wheelchair at discharge?: Yes Type of Wheelchair: Manual Wheelchair activity did not occur: Safety/medical concerns  Wheelchair assist level: Moderate Assistance - Patient 50 - 74% Max wheelchair distance: 163ft    Wheelchair 50 feet with 2 turns activity    Assist        Assist Level: Moderate Assistance - Patient 50 - 74%   Wheelchair 150 feet activity     Assist       Assist Level: Moderate Assistance - Patient 50 - 74%, Maximal Assistance - Patient 25 - 49%   Blood pressure 117/61, pulse (!) 53, temperature 98.2 F (36.8 C), temperature source Oral, resp. rate 16, height 5\' 11"  (1.803 m), weight 102.8 kg, SpO2 97 %.   Medical Problem List and Plan: 1.  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  Status post right ICA revascularization with stent assisted angioplasty 11/21/2019  Continue CIR team conf in am , cont PT, OT, SLP- some motor recovery noted   WHO/PRAFO nightly, PRAFO not seen in room 2.  Antithrombotics: -DVT/anticoagulation: Lovenox plt pnd             -antiplatelet therapy: Aspirin 81 mg daily and Brilinta 90 mg twice daily 3. Pain Management: prn meds 4. Mood: Provide emotional support             -antipsychotic agents: N/A 5. Neuropsych: This patient is capable of making decisions on his own behalf. 6. Skin/Wound Care: Routine skin checks 7. Fluids/Electrolytes/Nutrition: Routine in and outs 8.  Right medial sphenoid wing meningioma.  Followed by neurosurgery.  Decadron protocol.  No current indications for surgical intervention at this time  Continue to wean 9.  Hypertension.  Norvasc 10 mg daily, Zaroxolyn 2.5 mg 1/day on Monday, Lopressor 12.5 mg twice daily, Imdur 120 mg daily.  Vitals:   12/17/19 1955 12/18/19 0534  BP: 117/69 117/61  Pulse: 71 (!) 53  Resp: 18 16  Temp: 98.3 F (36.8 C) 98.2 F (36.8 C)  SpO2: 100% 97%   Relatively controlled on 10/25 10.  Diastolic congestive heart failure.  metoprolol, Lasix 40 mg twice daily.  Monitor for any signs of fluid overload  Lasix decreased to 20mg  BID  Daily weights  Filed Weights   12/16/19 0321 12/17/19 0444 12/18/19 0534  Weight: 101.2 kg 102.6 kg 102.8 kg   Stable on 10/24 11.  Hyperlipidemia.  Lipitor 12.   History of COPD/ tobacco abuse.  Counseling.  Albuterol inhaler as needed 13.  GERD.  Protonix 14.  Post stroke dysphagia.  Advanced to regular thins with supervision.  Follow-up speech therapy 15. Bradycardic:on BB monitor TID., D/Ced BB  , checked 10/8 EKG sinus brady rate 49 Vitals:   12/17/19 1955 12/18/19 0534  BP: 117/69 117/61  Pulse: 71 (!) 53  Resp: 18 16  Temp: 98.3 F (36.8 C) 98.2 F (36.8 C)  SpO2: 100% 97%   Relatively controlled on 10/25 16.  Morbid obesity: Encourage weight loss 17.  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  CKD- baseline ~1.5 when looking back to 4/21   Creatinine 1.38 on 10/18,  1.38 on 10/25  Encourage fluids  Continue to monitor 19.  Thrombocytopenia  Platelets 116 on 10/21, trending down, labs ordered for tomorrow 20.  Slow transit constipation  Bowel meds ordered on 10/23  Improving  LOS: 20 days A FACE TO FACE EVALUATION WAS PERFORMED  Charlett Blake 12/18/2019, 8:02 AM

## 2019-12-18 NOTE — Progress Notes (Signed)
Occupational Therapy Session Note  Patient Details  Name: Charles Sherman MRN: [id number] Date of Birth: Nov 01, 1945  Today's Date: 12/18/2019 OT Individual Time: 1100-1157 OT Individual Time Calculation (min): 57 min    Short Term Goals: Week 2:  OT Short Term Goal 1 (Week 2): Pt will completed UB dressing with min assist following hemi dressing techiques. OT Short Term Goal 1 - Progress (Week 2): Not met OT Short Term Goal 2 (Week 2): Pt will complete LB bathing with mod assist sit to stand. OT Short Term Goal 2 - Progress (Week 2): Met OT Short Term Goal 3 (Week 2): Pt will complete LB dressing with mod assist for brief and pants only. OT Short Term Goal 3 - Progress (Week 2): Met OT Short Term Goal 4 (Week 2): Pt will scan left of midline with no more than min instructional cueing to locate selfcare items during bathing tasks. OT Short Term Goal 4 - Progress (Week 2): Not met Week 3:  OT Short Term Goal 1 (Week 3): Pt will completed UB dressing with min assist following hemi dressing techiques. OT Short Term Goal 2 (Week 3): Pt will scan left of midline with no more than min instructional cueing to locate selfcare items during bathing tasks. OT Short Term Goal 3 (Week 3): Pt will use the LUE as a gross assist with min facilitation during selfcare tasks. OT Short Term Goal 4 (Week 3): Pt will complete toilet transfers to the left and right stand pivot with no more than mod assist.  Skilled Therapeutic Interventions/Progress Updates:    patient in bed, alert with min verbal stim.  Pleasant and cooperative, soft spoken.  He denies pain.  Supine to sit with mod A.  Sit pivot transfer bed to w/c (to left) mod A.  Sit pivot transfer w/c to/from mat table in gym min A to right, mod A to left.  He tolerated unsupported sitting for 40 minutes of session with focus on midline, posture, balance, core mobility, proximal stability and use as support.  L UE NMRE completed in seated and supine  positions with inhibition of flexors and facilitation of extensors - he is able to extend elbow fully with facilitation, improved shoulder control and ability to use as support post activity.  He remained seated in w/c at close of session seat belt alarm set and call bell/tray table in reach.    Therapy Documentation Precautions:  Precautions Precautions: Fall Precaution Comments: left neglect, left hemiparesis, slight pusher to the left Restrictions Weight Bearing Restrictions: No   Therapy/Group: Individual Therapy  [name] 12/18/2019, 7:40 AM

## 2019-12-18 NOTE — Progress Notes (Signed)
Physical Therapy Session Note  Patient Details  Name: Charles Sherman MRN: [id number] Date of Birth: 1946-02-18  Today's Date: 12/18/2019 PT Individual Time: 5997-7414 PT Individual Time Calculation (min): 73 min   Short Term Goals: Week 3:  PT Short Term Goal 1 (Week 3): STG=LTG based on ELOS  Skilled Therapeutic Interventions/Progress Updates:    Patient received sitting up in bed, agreeable to PT. He denies pain. Patient able to don pants supine in bed with ModA. Patient coming to sit edge of bed with MinA. Transfer to wc via squat pivot with MaxA. PT propelling patient to wc to time management and energy conservation. Patient able to ambulate 33ft with RW, modified hand grip + ModA. Wc follow. Patient maintains L lateral lean, but has a controlled posture. It appears as though he is standing with this lean as if it were his midline. No evidence of falling L at this time. Patient responsive to verbal cues to maintain abducted L LE through swing phase. Patient ambulating additional 17ft with RW and ModA. He was able to maintain dynamic standing balance with RW to complete reaching task outside his BOS. Patient able to maintain balance with intermittent verbal cues to return to midline as he was shift his weight back to the L after each rep. PT propelling patient in wc outside to assist with mood elevation. He returning to his room in wc, seatbelt alarm on, call light within reach.   Therapy Documentation Precautions:  Precautions Precautions: Fall Precaution Comments: left neglect, left hemiparesis, slight pusher to the left Restrictions Weight Bearing Restrictions: No    Therapy/Group: Individual Therapy  Karoline Caldwell, PT, DPT, CBIS 12/18/2019, 7:37 AM

## 2019-12-18 NOTE — Progress Notes (Signed)
Physical Therapy Session Note  Patient Details  Name: Charles Sherman MRN: [id number] Date of Birth: 09/23/1945  Today's Date: 12/18/2019 PT Individual Time: 0254-2706 PT Individual Time Calculation (min): 74 min   Short Term Goals: Week 3:  PT Short Term Goal 1 (Week 3): STG=LTG based on ELOS  Skilled Therapeutic Interventions/Progress Updates:    Pt received supine in bed, awake and agreeable to therapy session. Therapist and pt discussed D/C plan of going home with 24hr support from family with his grandson providing the physical assist as the pt's wife unable. Therapist discussed pt's fluctuating levels of assistance and recommendation for custom wheelchair evaluation/consulatation as pt will primarily use wheelchair as means of functional mobility at this time - discussed TIS w/c versus K4 lightweight wheelchair with plan for consultation this Thursday 12/20/2019 - pt in agreement. Supine>sitting R EOB, HOB partially elevated, with min assist for rotating L pelvis and trunk upright using R UE support on bedrail. Sitting EOB donned B LE TEDs and shoes max assist for time management. L stand pivot EOB>TIS w/c with min/mod assist for balance and eccentric control on descent as pt continues to demo L posterior trunk LOB when going to sit. Transported to/from gym in w/c for time management and energy conservation. Block practice stand pivot transfers w/c<>EOM with min assist towards R and min/mod assist towards L again due to L lean/pushing and uncontrolled L posterior trunk lean/LOB when going to sit. Pt noted to be incontinent of bladder. Transported back to room. Sit<>stand at sink for UE support with min/mod assist for balance due to L lean/pushing - use of mirror feedback for improved midline orientation. Standing at stink with therapist facilitating L UE WBing while pt completed LB clothing management with mod/max assist and peri-care with set-up assist using R hand; however, when pt letting  go of sink with R hand and performing dual-task challenge of balance and peri-care pt had significant L lean/pushing requiring max assist to maintain upright - frequent repeated cuing to return to midline with pt temporarily able to achieve with min assist until regressing back to L lean/push. Transported back to therapy gym. R stand pivot w/c>EOM with min assist. Dynamic standing balance task via cross body high/low reaching using R UE - min/mod assist for balance - when pt reaching low to the L requiring a squat position pt ended up always sitting back onto mat due to poor eccentric control and lack of adequate quad/glute motor recruitment to return to standing - when reaching high to L therapist facilitated increased L glute/quad activation and L weight shift. Sitting EOM participated in L attention/visual scanning and trunk control task of matching cards. Retrieved pt a different w/c cushion for improved pressure relief as pt reports he continues to have pain with prolonged sitting. L stand pivot EOM>w/c with min/mod assist for balance. Transferred back to room and left sitting tilted back in TIS w/c with needs in reach, seat belt alarm on, and L UE therapeutically positioned. Notified Benjie Karvonen, RN of pt's position in even pt needs pressure relief or assist back to bed.   Therapy Documentation Precautions:  Precautions Precautions: Fall Precaution Comments: left neglect, left hemiparesis, slight pusher to the left Restrictions Weight Bearing Restrictions: No  Pain: No reports of pain throughout session.   Therapy/Group: Individual Therapy  [name] , PT, DPT, CSRS  12/18/2019, 12:29 PM

## 2019-12-19 ENCOUNTER — Inpatient Hospital Stay (HOSPITAL_COMMUNITY): Payer: No Typology Code available for payment source | Admitting: Occupational Therapy

## 2019-12-19 ENCOUNTER — Inpatient Hospital Stay (HOSPITAL_COMMUNITY): Payer: No Typology Code available for payment source | Admitting: Speech Pathology

## 2019-12-19 ENCOUNTER — Inpatient Hospital Stay (HOSPITAL_COMMUNITY): Payer: No Typology Code available for payment source

## 2019-12-19 ENCOUNTER — Inpatient Hospital Stay: Payer: Medicare Other

## 2019-12-19 DIAGNOSIS — Z23 Encounter for immunization: Secondary | ICD-10-CM

## 2019-12-19 LAB — SARS CORONAVIRUS 2 BY RT PCR (HOSPITAL ORDER, PERFORMED IN ~~LOC~~ HOSPITAL LAB): SARS Coronavirus 2: NEGATIVE

## 2019-12-19 MED ORDER — ACETAMINOPHEN 325 MG PO TABS: 650.0000 mg | ORAL_TABLET | ORAL | Status: AC | PRN

## 2019-12-19 MED ORDER — PANTOPRAZOLE SODIUM 40 MG PO TBEC: 40.0000 mg | DELAYED_RELEASE_TABLET | Freq: Every day | ORAL | Status: AC

## 2019-12-19 MED ORDER — DEXAMETHASONE 0.5 MG PO TABS: 0.5000 mg | ORAL_TABLET | Freq: Two times a day (BID) | ORAL | Status: DC

## 2019-12-19 MED ORDER — FUROSEMIDE 20 MG PO TABS: 20.0000 mg | ORAL_TABLET | Freq: Two times a day (BID) | ORAL | Status: DC

## 2019-12-19 MED ORDER — ATORVASTATIN CALCIUM 80 MG PO TABS: 80.0000 mg | ORAL_TABLET | Freq: Every day | ORAL | Status: AC

## 2019-12-19 MED ORDER — POTASSIUM CHLORIDE CRYS ER 10 MEQ PO TBCR: 10.0000 meq | EXTENDED_RELEASE_TABLET | Freq: Two times a day (BID) | ORAL | Status: AC

## 2019-12-19 MED ORDER — SULFACETAMIDE SODIUM 10 % OP SOLN: 1.0000 [drp] | Freq: Three times a day (TID) | OPHTHALMIC | Status: DC

## 2019-12-19 MED ORDER — PREDNISOLONE ACETATE 1 % OP SUSP: 1.0000 [drp] | Freq: Two times a day (BID) | OPHTHALMIC | Status: DC

## 2019-12-19 MED ORDER — SULFACETAMIDE SODIUM 10 % OP SOLN
1.0000 [drp] | Freq: Two times a day (BID) | OPHTHALMIC | Status: DC
  Administered 2019-12-19 – 2019-12-20 (×2): 1 [drp] via OPHTHALMIC

## 2019-12-19 MED ORDER — POLYETHYLENE GLYCOL 3350 17 G PO PACK: 17.0000 g | PACK | Freq: Every day | ORAL | 0 refills | Status: DC

## 2019-12-19 MED ORDER — TICAGRELOR 90 MG PO TABS: 90.0000 mg | ORAL_TABLET | Freq: Two times a day (BID) | ORAL | Status: DC

## 2019-12-19 MED ORDER — PREDNISOLONE ACETATE 1 % OP SUSP: 1.0000 [drp] | Freq: Three times a day (TID) | OPHTHALMIC | Status: DC

## 2019-12-19 MED ORDER — SULFACETAMIDE SODIUM 10 % OP SOLN: 1.0000 [drp] | Freq: Two times a day (BID) | OPHTHALMIC | 0 refills | Status: DC

## 2019-12-19 MED ORDER — ARTIFICIAL TEARS OPHTHALMIC OINT: TOPICAL_OINTMENT | Freq: Every day | OPHTHALMIC | Status: DC

## 2019-12-19 NOTE — Progress Notes (Signed)
Speech Language Pathology Daily Session Note  Patient Details  Name: Charles Sherman MRN: [id number] Date of Birth: 07-29-1945  Today's Date: 12/19/2019 SLP Individual Time: 1415-1456 SLP Individual Time Calculation (min): 41 min  Short Term Goals: Week 3: SLP Short Term Goal 1 (Week 3): Pt will consume current regulat texture thin liquid diet Mod I for use of swallow strategies for oral clearance of left pocketing to increase independence with strategies and work toward decreasing supervision requirement during meals. SLP Short Term Goal 2 (Week 3): Pt will demonstrate ability to problem solve mildly complex tasks with Min A verbal/visual cues. SLP Short Term Goal 3 (Week 3): Pt will detect functional errors with Min A verbal/visual cues. SLP Short Term Goal 4 (Week 3): Pt will scan left visual field or attend to objects in left side of environment in 5/10 opportunities with Mod A multimodal cues. SLP Short Term Goal 5 (Week 3): Pt will selectively attend to functional tasks with Supervision A verbal and visual cues.  Skilled Therapeutic Interventions: Pt was seen for skilled ST targeting cognitive goals. SLP facilitated session with administration of MOCA version 7.1 as means of post-test for this admission, after receiving notification that pt would unexpectedly transfer to SNF tomorrow due to receiving bed offer (original d/c date was 11/2). Pt scored 20/30, indicative of moderate deficits in left neglect, executive function (emphasis on planning/organization), sustained and selective attention, and problem solving. Pt able to demonstrate basic problem solving skills with Min A verbal cues for strategies to do math calculations today. He still requires a significant amount of extra time to complete tasks, primarily due to decreased attention and initiation. Pt left laying in bed with alarm set and needs within reach.        Pain Pain Assessment Pain Scale: 0-10 Pain Score: 0-No  pain  Therapy/Group: Individual Therapy  Arbutus Leas 12/19/2019, 3:00 PM

## 2019-12-19 NOTE — Progress Notes (Signed)
   Covid-19 Vaccination Clinic  Name:  SEDERICK JACOBSEN    MRN: [id number] DOB: [birth year]  12/19/2019  Mr. Iodice was observed post Covid-19 immunization for 15 minutes without incident. He was provided with Vaccine Information Sheet and instruction to access the V-Safe system.   Mr. Harr was instructed to call 911 with any severe reactions post vaccine: Marland Kitchen Difficulty breathing  . Swelling of face and throat  . A fast heartbeat  . A bad rash all over body  . Dizziness and weakness

## 2019-12-19 NOTE — Progress Notes (Signed)
Speech Language Pathology Discharge Summary  Patient Details  Name: Charles Sherman MRN: [id number] Date of Birth: [birth year]   Patient has met 4 of 6 long term goals.  Patient to discharge at overall Supervision;Mod;Min level.  Reasons goals not met: requires increased cueing for basic problem solving and error awareness   Clinical Impression/Discharge Summary:   Pt made slow gains and goals were downgraded 12/19/19 (prior to SLP's notification that pt was accepted at [facility] and would work toward placement tomorrow 12/20/19 - this was unexpected, as his original d/c date was anticipated for 12/25/19). He met 4 out of 6 long term goals this admission. Pt currently requires Min-Mod assist for basic familiar tasks due to cognitive impairments most remarkable for severe left neglect, mild-mod attention, problem solving, and awareness deficits. As a result, he require strict 24/7 supervision and hands on care at discharge. Due to decreased caregiver support and limitations in ability to provide physical assistance, pt's family has decided to pursue SNF placement - this is also therapy recommendation. Pt is consuming an upgraded regular texture diet with thin liquids and is using swallow strategies for clearance of minimal left buccal pocketing of solids with distant Supervision A verbal cues. Pt has demonstrated improved oropharyngeal swallow function, independence with use of compensatory swallow strategies, attention, and basic familiar problem solving. However, given cognitive deficits still present and with significant impact on daily functioning, recommend pt continue to receive skilled ST services at SNF upon discharge. Pt education is complete at this time; no family was present for ST sessions, but they would benefit from edu at SNF prior to pt's potential return home.    Care Partner:  Caregiver Able to Provide Assistance: No     Recommendation:  24 hour supervision/assistance;Skilled Nursing  facility  Rationale for SLP Follow Up: Maximize cognitive function and independence;Reduce caregiver burden;Maximize swallowing safety   Equipment: none   Reasons for discharge: Discharged from [facility] 12/19/2019, 12:22 PM

## 2019-12-19 NOTE — NC FL2 (Signed)
[place] LEVEL OF CARE SCREENING TOOL     IDENTIFICATION  Patient Name: Charles Sherman Birthdate: 1945-03-09 Sex: male Admission Date (Current Location): 11/28/2019  [facility] and [place] Number:  [employer] and Address:  The . [facility], [place] [address], [place], [place] [address]      Provider Number:    Attending Physician Name and Address:  Charlett Blake, MD  Relative Name and Phone Number:  O'Brien (Medical POA): [phone]    Current Level of Care: Hospital Recommended Level of Care: [place] Prior Approval Number:    Date Approved/Denied:   PASRR Number: [id number]  Discharge Plan: SNF    Current Diagnoses: Patient Active Problem List   Diagnosis Date Noted  . Slow transit constipation   . Thrombocytopenia ([place])   . AKI (acute kidney injury) ([place])   . Hemiparesis affecting left side as late effect of stroke ([place])   . Morbid obesity ([place])   . Dysphagia, post-stroke   . Chronic diastolic congestive heart failure ([place])   . Essential hypertension   . 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  . Cerebral thrombosis with cerebral infarction 11/15/2019  . Meningioma ([place]) 11/14/2019    Orientation RESPIRATION BLADDER Height & Weight     Self, Time, Situation  Normal Incontinent Weight: 218 lb 14.7 oz (99.3 kg) Height:  5\' 11"  (180.3 cm)  BEHAVIORAL SYMPTOMS/MOOD NEUROLOGICAL BOWEL NUTRITION STATUS      Incontinent Diet (Reg Texture, thin liquids)  AMBULATORY STATUS COMMUNICATION OF NEEDS Skin   Extensive Assist Verbally Normal                       Personal Care Assistance Level of Assistance  Bathing, Feeding, Dressing Bathing Assistance: Limited assistance Feeding assistance: Limited assistance Dressing Assistance: Limited assistance     Functional Limitations Info             [place]  PT (By licensed PT), OT (By licensed OT), Speech therapy     PT Frequency: 5x a week OT Frequency: 5x a week     Speech Therapy Frequency: 5x a week      Contractures      Additional Factors Info  Code Status Code Status Info: Full             Current Medications (12/19/2019):  This is the current hospital active medication list Current Facility-Administered Medications  Medication Dose Route Frequency Provider Last Rate Last Admin  . acetaminophen (TYLENOL) tablet 650 mg  650 mg Oral Q4H PRN Cathlyn Parsons, PA-C   650 mg at 12/07/19 1953   Or  . acetaminophen (TYLENOL) 160 MG/5ML solution 650 mg  650 mg Per Tube Q4H PRN Angiulli, Lavon Paganini, PA-C       Or  . acetaminophen (TYLENOL) suppository 650 mg  650 mg Rectal Q4H PRN Angiulli, Lavon Paganini, PA-C      . albuterol (VENTOLIN HFA) 108 (90 Base) MCG/ACT inhaler 2 puff  2 puff Inhalation Q6H PRN Angiulli, Lavon Paganini, PA-C      . amLODipine (NORVASC) tablet 10 mg  10 mg Oral Daily Cathlyn Parsons, PA-C   10 mg at 12/19/19 4097  . artificial tears (LACRILUBE) ophthalmic ointment   Right Eye Q2200 Cathlyn Parsons, PA-C   Given at 12/18/19 2037  . aspirin chewable tablet 81 mg  81 mg Oral  Daily Cathlyn Parsons, PA-C   81 mg at 12/19/19 3016   Or  . aspirin chewable tablet 81 mg  81 mg Per Tube Daily Cathlyn Parsons, PA-C   81 mg at 12/06/19 0753  . atorvastatin (LIPITOR) tablet 80 mg  80 mg Oral q1800 Cathlyn Parsons, PA-C   80 mg at 12/18/19 1731  . camphor-menthol (SARNA) lotion   Topical PRN Cathlyn Parsons, PA-C   Given at 12/08/19 0109  . dexamethasone (DECADRON) tablet 0.5 mg  0.5 mg Oral Q12H Kirsteins, Luanna Salk, MD   0.5 mg at 12/19/19 3235  . docusate sodium (COLACE) capsule 100 mg  100 mg Oral Daily PRN Cathlyn Parsons, PA-C   100 mg at 12/12/19 1728  . enoxaparin (LOVENOX) injection 40 mg  40 mg Subcutaneous Q24H AngiulliLavon Paganini, PA-C   40 mg at 12/18/19 1731  . furosemide (LASIX) tablet  20 mg  20 mg Oral BID Charlett Blake, MD   20 mg at 12/19/19 0834  . isosorbide mononitrate (IMDUR) 24 hr tablet 120 mg  120 mg Oral Daily Cathlyn Parsons, PA-C   120 mg at 12/19/19 5732  . metolazone (ZAROXOLYN) tablet 2.5 mg  2.5 mg Oral Once per day on Mon Cathlyn Parsons, PA-C   2.5 mg at 12/17/19 2025  . multivitamin with minerals tablet 1 tablet  1 tablet Oral Daily Cathlyn Parsons, PA-C   1 tablet at 12/19/19 4270  . nitroGLYCERIN (NITROSTAT) SL tablet 0.4 mg  0.4 mg Sublingual Q5 Min x 3 PRN Angiulli, Lavon Paganini, PA-C      . pantoprazole (PROTONIX) EC tablet 40 mg  40 mg Oral Daily Cathlyn Parsons, PA-C   40 mg at 12/19/19 6237  . polyethylene glycol (MIRALAX / GLYCOLAX) packet 17 g  17 g Oral Daily Jamse Arn, MD   17 g at 12/17/19 0813  . potassium chloride (KLOR-CON) CR tablet 10 mEq  10 mEq Oral BID Cathlyn Parsons, PA-C   10 mEq at 12/19/19 6283  . prednisoLONE acetate (PRED FORTE) 1 % ophthalmic suspension 1 drop  1 drop Right Eye QID Kirsteins, Luanna Salk, MD   1 drop at 12/19/19 0837  . sulfacetamide (BLEPH-10) 10 % ophthalmic solution 1 drop  1 drop Right Eye QID Kirsteins, Luanna Salk, MD   1 drop at 12/19/19 0837  . ticagrelor (BRILINTA) tablet 90 mg  90 mg Oral BID Cathlyn Parsons, PA-C   90 mg at 12/19/19 1517   Or  . ticagrelor (BRILINTA) tablet 90 mg  90 mg Per Tube BID Cathlyn Parsons, PA-C   90 mg at 12/06/19 6160     Discharge Medications: Please see discharge summary for a list of discharge medications.  Relevant Imaging Results:  Relevant Lab Results:   Additional Information    Dyanne Iha

## 2019-12-19 NOTE — Progress Notes (Signed)
Patient ID: Charles Sherman, male   DOB: 03/08/1945, 74 y.o.   MRN: [id number]   SNF referral sent out to all facilities in  [place] and [place].  Family would like to pursue [facility].  [place], [place]

## 2019-12-19 NOTE — Progress Notes (Signed)
Occupational Therapy Discharge Summary  Patient Details  Name: Charles Sherman MRN: [id number] Date of Birth: [birth year]  Today's Date: 12/19/2019 OT Individual Time: 2725-3664 OT Individual Time Calculation (min): 58 min   Session 1: (4034-7425) Pt in wheelchair to start session with family present.  Discussed current progress with family and pt's current level of mod to max assist for dressing tasks and mod assist overall for bathing and toileting tasks.  Educated them on the need for longer term rehab to progress to at least a min assist level for eventual return home.  Family asked questions with regard to tub vs shower and therapist educated likely need for a tub bench at home including showing them an example.  Next, had pt complete stand pivot transfer from the wheelchair to the tub bench for practice stand pivot with mod assist.  Therapist then took pt down to the therapy gym where he completed stand pivot transfer to the therapy mat with min assist to the right.  Had him work on Musician. LUE placed in weightbearing beside of him on the mat as well as therapist's leg for increased weightbearing when completing sit to squat and reaching with the RUE.  He needed mod assist to achieve and maintain squat while completing task, secondary to lean to the left.  Progressed to working on functional reach at knee level with the RUE and mod assist for picking up small wadded up paper towels and placing on the bedside table and the mat.  Finished session with return to the wheelchair at [facility] assist and then transferred back to the room.  He was left sitting up in the tilt in space wheelchair at end of session with call button and phone in reach with safety belt in place.    Session 2: (9563-8756)  Pt in bed during session, worked on [employer] exercises with focus on shoulder flexion, elbow flexion/extension, and digit flexion/extension.  He is able to exhibit gross  digit flexion to within 70% of normal and extension of around 60%.  He completed 2 sets of 10 reps for shoulder flexion, and elbow flexion extension.  Mod assist for shoulder flexion with min assist for elbow movements.  He was left in the bed at conclusion of session with LUE positioned on pillows.    Patient has met 8 of 14 long term goals due to improved activity tolerance, improved balance, postural control, ability to compensate for deficits, functional use of  LEFT upper and LEFT lower extremity, improved attention, improved awareness and improved coordination.  Patient to discharge at overall Mod Assist level.  Patient's care partner unavailable to provide the necessary physical and cognitive assistance at discharge.    Reasons goals not met: Pt discharged to SNF prior to original  ELOS based on change in discharge assistance available.  Recommendation:  Patient will benefit from ongoing skilled OT services in skilled nursing facility setting to continue to advance functional skills in the area of BADL and Reduce care partner burden.  Pt continues with left neglect as well as left hemiparesis and increased left pushing with dynamic sitting and standing during ADL tasks.  He will continue to need extensive SNF rehab to reach min assist level goals for return home with his spouse and other family  Equipment: No equipment provided  Reasons for discharge: discharge from hospital  Patient/family agrees with progress made and goals achieved: Yes  OT Discharge Precautions/Restrictions   LUE and LLE hemiparesis, left neglect  Pain Pain Assessment Pain Scale: 0-10 Pain Score: 0-No pain ADL ADL Eating: Supervision/safety Where Assessed-Eating: Wheelchair Grooming: Supervision/safety Where Assessed-Grooming: Wheelchair Upper Body Bathing: Minimal assistance Where Assessed-Upper Body Bathing: Shower Lower Body Bathing: Moderate assistance Where Assessed-Lower Body Bathing: Wheelchair Upper  Body Dressing: Moderate assistance Where Assessed-Upper Body Dressing: Wheelchair Lower Body Dressing: Moderate assistance Where Assessed-Lower Body Dressing: Edge of bed, Wheelchair Toileting: Moderate assistance Where Assessed-Toileting: Bedside Commode Toilet Transfer: Moderate assistance Toilet Transfer Method: Stand pivot Toilet Transfer Equipment: Radiographer, therapeutic: Moderate assistance Social research officer, government: Moderate assistance Social research officer, government Method: Radiographer, therapeutic: Gaffer Baseline Vision/History: No visual deficits Patient Visual Report: Blurring of vision Vision Assessment?: Yes Eye Alignment: Impaired (comment) (right eye remains closed) Ocular Range of Motion: Within Functional Limits (left eye ocular ROM WFLs with gross testing) Alignment/Gaze Preference: Head turned (Head turn to the right) Tracking/Visual Pursuits: Decreased smoothness of horizontal tracking;Decreased smoothness of vertical tracking;Other (comment) Convergence: Impaired (comment) Visual Fields: Left homonymous hemianopsia Perception  Perception: Impaired Inattention/Neglect: Does not attend to left visual field;Does not attend to left side of body Praxis Praxis: Intact Cognition Overall Cognitive Status: Impaired/Different from baseline Arousal/Alertness: Awake/alert Attention: Sustained Sustained Attention: Impaired Sustained Attention Impairment: Verbal basic;Functional basic Selective Attention: Impaired Selective Attention Impairment: Verbal basic Memory: Impaired Decreased Short Term Memory: Verbal basic;Functional basic Problem Solving: Impaired Problem Solving Impairment: Verbal basic;Functional basic Reasoning: Impaired Reasoning Impairment: Functional basic Decision Making: Impaired Decision Making Impairment: Functional basic Initiating: Impaired Initiating Impairment: Functional basic;Verbal  basic Safety/Judgment: Impaired Sensation Sensation Light Touch: Impaired Detail Light Touch Impaired Details: Impaired LUE Hot/Cold: Appears Intact Proprioception: Impaired Detail Proprioception Impaired Details: Impaired LUE Stereognosis: Not tested Coordination Gross Motor Movements are Fluid and Coordinated: No Fine Motor Movements are Fluid and Coordinated: No Coordination and Movement Description: Pt Brunnstrum stage IV in the arm and hand.  He continues to need max hand over hand assistance for bathing tasks.  Mod assist to hold items to be opened. Motor  Motor Motor: Hemiplegia;Abnormal postural alignment and control Motor - Discharge Observations: left UE and LE hemiparesis Mobility  Bed Mobility Bed Mobility: Supine to Sit;Sit to Supine Supine to Sit: Moderate Assistance - Patient 50-74% Sit to Supine: Moderate Assistance - Patient 50-74% Transfers Sit to Stand: Minimal Assistance - Patient > 75% Stand to Sit: Minimal Assistance - Patient > 75%  Trunk/Postural Assessment  Cervical Assessment Cervical Assessment: Exceptions to [facility] (head rotation to the left) Thoracic Assessment Thoracic Assessment: Exceptions to [facility] (thoracic rounding) Lumbar Assessment Lumbar Assessment: Exceptions to [facility] (posterior pelvic tilt) Postural Control Trunk Control: Pt still with slight pushing to the left in sitting during dynamic tasks as well as standing, but improved since initial evaluation.  Balance Balance Balance Assessed: Yes Static Sitting Balance Static Sitting - Balance Support: Feet supported Static Sitting - Level of Assistance: 5: Stand by assistance Dynamic Sitting Balance Dynamic Sitting - Balance Support: During functional activity Dynamic Sitting - Level of Assistance: 4: Min assist Static Standing Balance Static Standing - Balance Support: During functional activity Static Standing - Level of Assistance: 4: Min assist Dynamic Standing Balance Dynamic Standing -  Balance Support: During functional activity Dynamic Standing - Level of Assistance: 2: Max assist Extremity/Trunk Assessment RUE Assessment RUE Assessment: Within Functional Limits LUE Assessment LUE Assessment: Exceptions to [facility] Passive Range of Motion (PROM) Comments: WFLs Active Range of Motion (AROM) Comments: Brunnstrum stage IV in the left arm and hand currently.  Gross digit flexion  at approximately 70% of normal and 60% of digit extension.  He needs max hand over hand assist for functional use during bathing and dressing tasks.   Saide Lanuza OTR/L 12/19/2019, 4:52 PM

## 2019-12-19 NOTE — Progress Notes (Signed)
Patient ID: Charles Sherman, male   DOB: [birth year], 74 y.o.   MRN: [id number]  Patient will discharge to SNF [facility]) on tomorrow 10/28. SW has requested order for COVID Test today. PTAR transport set for 12:00 PM. Family has been informed.  [name], [place]

## 2019-12-19 NOTE — Plan of Care (Signed)
  Problem: RH Swallowing Goal: LTG Patient will consume least restrictive diet using compensatory strategies with assistance (SLP) Description: LTG:  Patient will consume least restrictive diet using compensatory strategies with assistance (SLP) Flowsheets (Taken 12/19/2019 1152) LTG: Pt Patient will consume least restrictive diet using compensatory strategies with assistance of (SLP): Supervision Note: Downgraded due to slower than anticipated progress Goal: LTG Patient will participate in dysphagia therapy to increase swallow function with assistance (SLP) Description: LTG:  Patient will participate in dysphagia therapy to increase swallow function with assistance (SLP) Flowsheets (Taken 12/19/2019 1152) LTG: Pt will participate in dysphagia therapy to increase swallow function with assistance of (SLP): Supervision Note: Downgraded due to slower than anticipated progress   Problem: RH Problem Solving Goal: LTG Patient will demonstrate problem solving for (SLP) Description: LTG:  Patient will demonstrate problem solving for basic/complex daily situations with cues  (SLP) Flowsheets (Taken 12/19/2019 1152) LTG: Patient will demonstrate problem solving for (SLP): Basic daily situations LTG Patient will demonstrate problem solving for: Minimal Assistance - Patient > 75% Note: Downgraded due to slower than anticipated progress   Problem: RH Attention Goal: LTG Patient will demonstrate this level of attention during functional activites (SLP) Description: LTG:  Patient will will demonstrate this level of attention during functional activites (SLP) Flowsheets (Taken 12/19/2019 1152) Patient will demonstrate during cognitive/linguistic activities the attention type of: Sustained Patient will demonstrate this level of attention during cognitive/linguistic activities in: Controlled LTG: Patient will demonstrate this level of attention during cognitive/linguistic activities with assistance of (SLP):  Minimal Assistance - Patient > 75% Number of minutes patient will demonstrate attention during cognitive/linguistic activities: 10 Note: Downgraded due to slower than anticipated progress   Problem: RH Awareness Goal: LTG: Patient will demonstrate awareness during functional activites type of (SLP) Description: LTG: Patient will demonstrate awareness during functional activites type of (SLP) Flowsheets (Taken 12/19/2019 1152) LTG: Patient will demonstrate awareness during cognitive/linguistic activities with assistance of (SLP): Moderate Assistance - Patient 50 - 74% Note: Downgraded due to slower than anticipated progress   Problem: RH Pre-functional/Other (Specify) Goal: RH LTG SLP (Specify) 1 Description: RH LTG SLP (Specify) 1 Flowsheets (Taken 12/19/2019 1152) LTG: Other SLP (Specify) 1: Pt will scan left visual field/environment with Mod A multimodal cues. Note: Downgraded due to slower than anticipated progress

## 2019-12-19 NOTE — Patient Care Conference (Signed)
Inpatient RehabilitationTeam Conference and Plan of Care Update Date: 12/19/2019   Time: 10:56 AM    Patient Name: Charles Sherman      Medical Record Number: [id number]  Date of Birth: [birth year] Sex: Male         Room/Bed: 4M13C/4M13C-01 Payor Info: Payor: MEDICARE / Plan: MEDICARE PART A / Product Type: *No Product type* /    Admit Date/Time:  11/28/2019  4:03 PM  Primary Diagnosis:  Cerebral thrombosis with cerebral infarction [facility])  Hospital Problems: Principal Problem:   Cerebral thrombosis with cerebral infarction Active Problems:   Right middle cerebral artery stroke (HCC)   Hemiparesis affecting left side as late effect of stroke ([place])   Morbid obesity (HCC)   Dysphagia, post-stroke   Chronic diastolic congestive heart failure ([place])   Essential hypertension   Thrombocytopenia (HCC)   AKI (acute kidney injury) ([place])   Slow transit constipation    Expected Discharge Date: Expected Discharge Date:  (SNF pending)  Team Members Present: Physician leading conference: Dr. Alysia Penna Care Coodinator Present: Dorien Chihuahua, RN, BSN, CRRN;Christina Moriches, BSW Nurse Present: Rayne Du, LPN PT Present: Estevan Ryder, PT OT Present: Clyda Greener, OT SLP Present: Jettie Booze, CF-SLP PPS Coordinator present : Ileana Ladd, PT     Current Status/Progress Goal Weekly Team Focus  Bowel/Bladder   Cont./Incont. of bladder at times; last BM 10/26  Become continent x2  Assess every shift and as needed   Swallow/Nutrition/ Hydration   Regular textures, thin liquids, Supervision A swallow strategies  Mod I least restrictive diet  carryover and independence with swallow strategies, education   ADL's   UB bathing and dressing mod A, LB bathing mod A, dressing max A, sit pivot transfers mod A, mod A to stand at rail with right hand support to complete clothing management/hygiene max A/dep  min to mod assist  L NMRE, transfer training, adl training, patient/family  education, improve left side awareness and midline orientation   Mobility   min/mod A bed mobility using bed features, min/mod assist sit<>stands and stand pivot transfers (requires more assist transferring to L compared to R), gait training up to 68ft using RW with mod assist of 1 and +2 w/c follow, mod assist w/c propulsion up to 174ft, continues to demo strong R gaze preference  min assist overall  midline orientation, L attention, L LE neuro re-ed, dynamic sitting balance, dynamic standing balance, transfer training, gait training, activity tolerance, pt education, w/c propulsion, discharge planning   Communication             Safety/Cognition/ Behavioral Observations  Min-Mod depending on complexity of tasks, more Max A for error awareness  Supervision, will likely downgrade  initiation, selective attention, functional problem solving, error awareness, visual scanning, education   Pain   No pain reported during shift  Pain <3/10  Assess every shift and as needed   Skin   Abrasion to right leg/foot  Prevent further breakdown  Assess every shift and as needed     Discharge Planning:  Discharging home with family (spouse, sister, grandson) family education scheduled. 1 level home, 2 steps to enter   Team Discussion: Some return of function on hemiparetic side however continue to note left neglect. Incontinent of bowel and bladder; timed toileting protocol initiated.  Requires hand over hand for integration. Progress impaired by right gaze preference, poor attention, pushing, decreased awareness,  and left inattention. Patient on target to meet rehab goals: yes, goals set for min-mod assist.  *  See Care Plan and progress notes for long and short-term goals.   Revisions to Treatment Plan:  Sister and wife unable to manage care needs at discharge; destination changed to SNF Goals downgraded to min-mod assist for SLP due to issues with problem solving, error awareness, left  inattention.  Teaching Needs: Transfers, toileting, medications, etc.   Current Barriers to Discharge: Decreased caregiver support  Possible Resolutions to Barriers: Discharge changed to SNF     Medical Summary Current Status: weaning decadron, BP with lability , no symptomatic orthostasis, left neglect persists  Barriers to Discharge: Medical stability;Incontinence           I attest that I was present, lead the team conference, and concur with the assessment and plan of the team.   Dorien Chihuahua B 12/19/2019, 3:54 PM

## 2019-12-19 NOTE — Progress Notes (Signed)
Physical Therapy Session Note  Patient Details  Name: Charles Sherman MRN: [id number] Date of Birth: December 06, 1945  Today's Date: 12/19/2019 PT Individual Time: 0915-1028 PT Individual Time Calculation (min): 73 min   Short Term Goals: Week 3:  PT Short Term Goal 1 (Week 3): STG=LTG based on ELOS  Skilled Therapeutic Interventions/Progress Updates:    Patient received supine in bed agreeable to PT. He denies pain. Wife and sister at bedside. Patient requiring MaxA to don pants supine in bed. Patient able to come to sitting edge of bed with MinA and verbal cues. [place] stand pivot transfer to wc leading L. PT propelling patient in wc to therapy gym. He was able to complete sit to stand with MinA and RW using mirror for visual feedback on postural control. Patient able to remain standing for ~2 mins prior to fatiguing. He ambulated the following distances on treadmill with LiteGait: total of 3 mins at 0.77mph for 128ft. Manual facilitation of weight shift, ModA for L knee stability and L foot clearance. Patient with poor attention to task, often forgetting to complete B stepping unless verbally cued. Patient requiring Max cuing and max manual facilitation to be able to walk off of treadmill with LiteGait to return to wc. Patient then able to ambulate 15ft and additional 88ft with RW, modified hand grip and ModA. Wc follow for convenience. Patient returning to room in wc, seatbelt alarm on, call light within reach, family at bedside.   Therapy Documentation Precautions:  Precautions Precautions: Fall Precaution Comments: left neglect, left hemiparesis, slight pusher to the left Restrictions Weight Bearing Restrictions: No    Therapy/Group: Individual Therapy  Karoline Caldwell, PT, DPT, CBIS 12/19/2019, 7:41 AM

## 2019-12-19 NOTE — Progress Notes (Signed)
Physical Therapy Discharge Summary  Patient Details  Name: Charles Sherman MRN: [id number] Date of Birth: 1945/12/14   Patient has met 3 of 9 long term goals due to improved activity tolerance, improved balance, improved postural control, increased strength, increased range of motion, ability to compensate for deficits, functional use of  left upper extremity and left lower extremity, improved attention, improved awareness and improved coordination.  Patient to discharge at a wheelchair level [place].   Patient's care partner unavailable to provide the necessary physical and cognitive assistance at discharge.  Reasons goals not met: Patient with unexpected dc early to SNF as opposed to home with family. He maintains L lateral lean in sitting and standing, stronger in standing. 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 At time of dc, patient requiring grossly MinA/ModA and family unable to provide that level of assist resulting in dc to SNF.   Recommendation:  Patient will benefit from ongoing skilled PT services in skilled nursing facility setting to continue to advance safe functional mobility, address ongoing impairments in midline orientation, dynamic sitting balance, static/dynamic standing balance, gait, balance, endurance, attention, and minimize fall risk.  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 Restrictions Weight Bearing Restrictions: No Vision/Perception  Perception Perception: Impaired Inattention/Neglect: Does not attend to left visual field;Does not attend to left side of body Praxis Praxis: Intact  Cognition Overall Cognitive Status:  Impaired/Different from baseline Arousal/Alertness: Awake/alert Awareness: Impaired Problem Solving: Impaired Reasoning: Impaired Safety/Judgment: Impaired Sensation Sensation Light Touch: Impaired Detail Light Touch Impaired Details: Impaired LUE Hot/Cold: Appears Intact Proprioception: Impaired Detail Proprioception Impaired Details: Impaired LUE Stereognosis: Appears Intact Coordination Gross Motor Movements are Fluid and Coordinated: No Fine Motor Movements are Fluid and Coordinated: No Coordination and Movement Description: Patient with delayed motor processing to L LE with unbalanced motor activation Heel Shin Test: limited ROM performed with L LE Motor  Motor Motor: Hemiplegia;Abnormal postural alignment and control Motor - Discharge Observations: left UE and LE hemiparesis  Mobility Bed Mobility Bed Mobility: Rolling Right;Rolling Left;Sit to Supine;Supine to Sit;Sitting - Scoot to [employer] of Bed Rolling Right: Contact Guard/Touching assist Rolling Left: Contact Guard/Touching assist Supine to Sit: Minimal Assistance - Patient > 75% Sitting - Scoot to Edge of Bed: Minimal Assistance - Patient > 75% Sit to Supine: Moderate Assistance - Patient 50-74% Transfers Transfers: Sit to Stand;Stand to Sit;Stand Pivot Transfers;Squat Pivot Transfers Sit to Stand: Minimal Assistance - Patient > 75% Stand to Sit: Minimal Assistance - Patient > 75% Stand Pivot Transfers: Minimal Assistance - Patient > 75% Stand Pivot Transfer Details: Tactile cues for posture;Verbal cues for technique;Verbal cues for sequencing Squat Pivot Transfers: Minimal Assistance - Patient > 75% Transfer (Assistive device): 1 person hand held assist Locomotion  Gait Ambulation: Yes Gait Assistance: Moderate Assistance - Patient 50-74% Gait Distance (Feet): 75 Feet Assistive device: Rolling walker Gait Assistance Details: Tactile cues for placement;Tactile cues for weight beaing;Tactile cues for  sequencing;Tactile cues for weight shifting;Tactile cues for posture;Verbal cues for technique;Verbal cues for sequencing;Verbal cues for safe use of DME/AE;Manual facilitation for placement;Verbal cues for gait pattern Gait Assistance Details: wc follow for convenience Gait Gait: Yes Gait Pattern: Impaired Gait Pattern: Step-to pattern;Decreased stance time - left;Decreased step length - right;Decreased dorsiflexion - left;Left circumduction;Left foot  flat;Scissoring;Lateral trunk lean to left;Narrow base of support;Poor foot clearance - left;Trunk rotated posteriorly on left;Decreased weight shift to right;Left flexed knee in stance Gait velocity: decreased Stairs / Additional Locomotion Stairs: No Wheelchair Mobility Wheelchair Mobility: Yes Wheelchair Assistance: Minimal assistance - Patient >75% Environmental health practitioner: Both lower extermities Wheelchair Parts Management: Needs assistance Distance: 100  Trunk/Postural Assessment  Cervical Assessment Cervical Assessment: Exceptions to [facility] (R gaze preference) Thoracic Assessment Thoracic Assessment: Exceptions to [facility] (incrased kyphosis) Lumbar Assessment Lumbar Assessment: Exceptions to [facility] (posterior pelvic tilt) Postural Control Postural Control: Deficits on evaluation Trunk Control: slight pushing L in sitting and standing, improved since eval Righting Reactions: delayed and inadequate, leans L  Balance Balance Balance Assessed: Yes Static Sitting Balance Static Sitting - Balance Support: Feet supported Static Sitting - Level of Assistance: 5: Stand by assistance Dynamic Sitting Balance Dynamic Sitting - Balance Support: During functional activity Dynamic Sitting - Level of Assistance: 4: Min Insurance risk surveyor Standing - Balance Support: During functional activity Static Standing - Level of Assistance: 4: Min assist Dynamic Standing Balance Dynamic Standing - Balance Support: During functional  activity Dynamic Standing - Level of Assistance: 3: Mod assist Extremity Assessment      RLE Assessment RLE Assessment: Within Functional Limits LLE Assessment LLE Assessment: Exceptions to [facility] LLE Strength Left Hip Flexion: 2/5 Left Hip ABduction: 2+/5 Left Hip ADduction: 3-/5 Left Knee Flexion: 2+/5 Left Knee Extension: 2+/5 Left Ankle Dorsiflexion: 2+/5 Left Ankle Plantar Flexion: 2+/5    Debbora Dus 12/19/2019, 4:05 PM

## 2019-12-19 NOTE — Discharge Summary (Signed)
Physician Discharge Summary  Patient ID: Charles Sherman MRN: [id number] DOB/AGE: April 13, 1945 74 y.o.  Admit date: 11/28/2019 Discharge date: 12/20/2019  Discharge Diagnoses:  Principal Problem:   Cerebral thrombosis with cerebral infarction Active Problems:   Right middle cerebral artery stroke [facility])   Hemiparesis affecting left side as late effect of stroke (HCC)   Morbid obesity (HCC)   Dysphagia, post-stroke   Chronic diastolic congestive heart failure (HCC)   Essential hypertension   Thrombocytopenia (HCC)   AKI (acute kidney injury) ([place])   Slow transit constipation COPD with tobacco abuse GERD Morbid obesity History of right medial sphenoid wing meningioma  Discharged Condition: Stable  Significant Diagnostic Studies: CT Head Wo Contrast  Result Date: 11/22/2019 CLINICAL DATA:  Stroke.  Meningioma. EXAM: CT HEAD WITHOUT CONTRAST TECHNIQUE: Contiguous axial images were obtained from the base of the skull through the vertex without intravenous contrast. COMPARISON:  CT head 11/16/2019.  MRI head 11/14/2019 FINDINGS: Brain: Large calcified meningioma right cavernous sinus unchanged with surrounding vasogenic edema. 9 mm midline shift to the left unchanged. Negative for hydrocephalus. Right MCA infarct was identified on recent MRI. There are patchy areas of hypodensity in the right frontal cortex over the convexity and in the right occipital parietal lobe which show restricted diffusion. Additional smaller areas of restricted diffusion in the right basal ganglia show ill-defined patchy hypodensity on CT. No acute hemorrhage. No change from recent CT. Vascular: Negative for hyperdense vessel. Atherosclerotic calcification in the distal vertebral arteries and the cavernous carotid bilaterally. Skull: Negative Sinuses/Orbits: Mild mucosal edema paranasal sinuses. Negative orbit Other: None IMPRESSION: Subacute infarct right MCA territory is best identified on recent MRI. There are  areas of hypodensity in the right frontal lobe and right occipital parietal lobe and right basal ganglia, unchanged from the recent CT. These areas show restricted diffusion on MRI. No acute hemorrhage Large right cavernous calcified meningioma surrounding vasogenic edema unchanged. 9 mm midline shift to the left unchanged. Electronically Signed   By: Franchot Gallo M.D.   On: 11/22/2019 10:31   IR INTRAVSC STENT CERV CAROTID W/O EMB-PROT MOD SED  Result Date: 11/23/2019 CLINICAL DATA:  Left-sided hemiplegia with right gaze deviation due to right middle cerebral artery stroke. 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 EXAM: INTRAVSC STENT CERV CAROTID W/O EMB-PROT COMPARISON:  Diagnostic arteriogram of November 19, 2019. MEDICATIONS: Heparin 3,000 units IV. Ancef 2 g IV antibiotic was administered within 1 hour of the procedure. ANESTHESIA/SEDATION: General anesthesia CONTRAST:  Isovue 300 approximately 65 mL FLUOROSCOPY TIME:  Fluoroscopy Time: 20 minutes 24 seconds (1621 mGy). COMPLICATIONS: None immediate. TECHNIQUE: Informed written consent was obtained from the patient after a thorough discussion of the procedural risks, benefits and alternatives. All questions were addressed. Maximal Sterile Barrier Technique was utilized including caps, mask, sterile gowns, sterile gloves, sterile drape, hand hygiene and skin antiseptic. A timeout was performed prior to the initiation of the procedure. The right groin was prepped and draped in the usual sterile fashion. Thereafter using modified Seldinger technique, transfemoral access into the right common femoral artery was obtained without difficulty. Over a 0.035 inch guidewire, a 8 [place] Pinnacle sheath was inserted. Through this, and also over 0.035 inch guidewire, a 5 [place] JB 1 catheter was advanced to the aortic arch region and selectively positioned in the right common carotid artery. FINDINGS: The right common  carotid arteriogram again demonstrates patency of the right external carotid artery with mild stenosis. Severe high-grade stenosis of  the right internal carotid artery proximally secondary to a partially calcified large plaque. The proximal right internal carotid artery demonstrates a U-shaped configuration without evidence of kinking. More distally the vessel is seen to opacify to the cranial skull base. The petrous, the cavernous and the supraclinoid segments of the right ICA are widely patent. The right middle cerebral artery and the hypoplastic right anterior cerebral artery opacify into the capillary and venous phases. Mass-effect from the right-sided intracranial tumor is again noted on the supraclinoid right ICA, and right middle cerebral artery, without any intraluminal filling defects. ENDOVASCULAR REVASCULARIZATION OF SYMPTOMATIC HIGH-GRADE RIGHT ICA STENOSIS The diagnostic JB 1 catheter in the right common carotid artery was then exchanged over a 0.035 inch Rosen exchange guidewire for an 087 balloon guide catheter which was advanced to just proximal to the right common carotid bifurcation. The guidewire was removed. Good aspiration obtained from the hub of the balloon guide catheter. Over a 0.014 inch standard Synchro micro guidewire, with a J configuration, an 021 Trevo ProVue microcatheter was advanced to the right common carotid bifurcation proximal to the right internal carotid artery. Using a torque device, the micro guidewire was then gently advanced without any difficulty to the horizontal petrous segment followed by the microcatheter. The guidewire was removed. Good aspiration obtained from the hub of the microcatheter. This in turn was then replaced with a 300 cm 014 inch standard Synchro exchange micro guidewire with a J-tip configuration. Measurements were then performed of the right internal carotid artery in its proximal 1/3 normal segment, and in the distal right common carotid artery  normal segment. It was elected to proceed with placement of a 6/8 mm x 40 mm Xact stent with proximal flow arrest. The stent was prepped and purged retrogradely in the usual manner. Using the rapid exchange technique, the stent with its delivery system was advanced to just proximal to the right internal carotid artery. Proximal flow arrest was then initiated by inflating the balloon in the distal right common carotid artery. The stent was then advanced and positioned distally and proximally to provide optimal coverage. With proximal flow arrest and aspiration being applied at the hub of the balloon guide catheter with an aspiration device, the stent was then deployed in the usual manner without any difficulty. The delivery mechanism was retrieved and removed following reversal of the flow arrest. A control arteriogram performed through the balloon guide in the right common carotid artery demonstrated now significantly improved caliber and flow through the stented segment and intracranially. The proximal portion of the stent was noted to be incompletely apposed in the right common carotid artery distally. This necessitated the use of a second Xact stent this time an 8/10 mm x 40 mm dimension. Again this was prepped and purged in the usual manner and then advanced again using the rapid exchange technique. This stent was deployed with the distal portion of the stent telescoping into the mid segment of the first stent. The delivery mechanism was retrieved and removed. A control arteriogram performed through the balloon guide catheter in the right common carotid now demonstrated excellent apposition of the stents proximally and distally with significantly improved caliber of the right internal carotid proximally. A control arteriogram performed intracranially demonstrated wide patency of the right middle cerebral artery and the right anterior cerebral arteries with no change in the homogeneous opacification of the large  tumor. Control arteriograms were then performed at 15 and 30 minutes post deployment of the 2 stents. These continued to  demonstrate no significant intraluminal filling defects, or changes intracranially. A final control arteriogram was performed following removal of the exchange guidewire. There continued to be excellent flow through the right internal carotid artery proximally and distally with no evidence of intraluminal filling defects or occlusions. Throughout the procedure, the patient's hemodynamic status remained stable. The 8 French balloon guide was then retrieved and removed. Manual compression was applied at the right groin puncture site due to significant atherosclerotic disease of the common femoral artery. Following hemostasis, the groin appeared soft. Distal pulses remained Dopplerable in the dorsalis pedis, and the posterior tibial regions bilaterally unchanged. A CT of the brain performed following the procedure demonstrated no change in the mass effect secondary to the tumor or hemorrhagic complication within the cerebral tissue or within the tumor itself. Patient's general anesthesia was then reversed. Patient gradually recovered and was able to obey simple commands appropriately. The patient moved his right arm and leg spontaneously. There was no left sided weakness or the right gaze deviation noted prior to the procedure. The patient did not complain of any headaches, nausea or vomiting. He was transferred to the floor for post revascularization management. 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 PLAN: Follow-up in the clinic 4 to 6 weeks post discharge. Electronically Signed   By: Luanne Bras M.D.   On: 11/22/2019 12:14   IR CT Head Ltd  Result Date: 11/23/2019 CLINICAL DATA:  Left-sided hemiplegia with right  gaze deviation due to right middle cerebral artery stroke. 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 EXAM: INTRAVSC STENT CERV CAROTID W/O EMB-PROT COMPARISON:  Diagnostic arteriogram of November 19, 2019. MEDICATIONS: Heparin 3,000 units IV. Ancef 2 g IV antibiotic was administered within 1 hour of the procedure. ANESTHESIA/SEDATION: General anesthesia CONTRAST:  Isovue 300 approximately 65 mL FLUOROSCOPY TIME:  Fluoroscopy Time: 20 minutes 24 seconds (1621 mGy). COMPLICATIONS: None immediate. TECHNIQUE: Informed written consent was obtained from the patient after a thorough discussion of the procedural risks, benefits and alternatives. All questions were addressed. Maximal Sterile Barrier Technique was utilized including caps, mask, sterile gowns, sterile gloves, sterile drape, hand hygiene and skin antiseptic. A timeout was performed prior to the initiation of the procedure. The right groin was prepped and draped in the usual sterile fashion. Thereafter using modified Seldinger technique, transfemoral access into the right common femoral artery was obtained without difficulty. Over a 0.035 inch guidewire, a 8 [place] Pinnacle sheath was inserted. Through this, and also over 0.035 inch guidewire, a 5 [place] JB 1 catheter was advanced to the aortic arch region and selectively positioned in the right common carotid artery. FINDINGS: The right common carotid arteriogram again demonstrates patency of the right external carotid artery with mild stenosis. Severe high-grade stenosis of the right internal carotid artery proximally secondary to a partially calcified large plaque. The proximal right internal carotid artery demonstrates a U-shaped configuration without evidence of kinking. More distally the vessel is seen to opacify to the cranial skull base. The petrous, the cavernous and the supraclinoid segments of the right ICA are widely patent. The right middle cerebral  artery and the hypoplastic right anterior cerebral artery opacify into the capillary and venous phases. Mass-effect from the right-sided intracranial tumor is again noted on the supraclinoid right ICA, and right middle cerebral artery, without any intraluminal filling defects. ENDOVASCULAR REVASCULARIZATION OF SYMPTOMATIC  HIGH-GRADE RIGHT ICA STENOSIS The diagnostic JB 1 catheter in the right common carotid artery was then exchanged over a 0.035 inch Rosen exchange guidewire for an 087 balloon guide catheter which was advanced to just proximal to the right common carotid bifurcation. The guidewire was removed. Good aspiration obtained from the hub of the balloon guide catheter. Over a 0.014 inch standard Synchro micro guidewire, with a J configuration, an 021 Trevo ProVue microcatheter was advanced to the right common carotid bifurcation proximal to the right internal carotid artery. Using a torque device, the micro guidewire was then gently advanced without any difficulty to the horizontal petrous segment followed by the microcatheter. The guidewire was removed. Good aspiration obtained from the hub of the microcatheter. This in turn was then replaced with a 300 cm 014 inch standard Synchro exchange micro guidewire with a J-tip configuration. Measurements were then performed of the right internal carotid artery in its proximal 1/3 normal segment, and in the distal right common carotid artery normal segment. It was elected to proceed with placement of a 6/8 mm x 40 mm Xact stent with proximal flow arrest. The stent was prepped and purged retrogradely in the usual manner. Using the rapid exchange technique, the stent with its delivery system was advanced to just proximal to the right internal carotid artery. Proximal flow arrest was then initiated by inflating the balloon in the distal right common carotid artery. The stent was then advanced and positioned distally and proximally to provide optimal coverage. With  proximal flow arrest and aspiration being applied at the hub of the balloon guide catheter with an aspiration device, the stent was then deployed in the usual manner without any difficulty. The delivery mechanism was retrieved and removed following reversal of the flow arrest. A control arteriogram performed through the balloon guide in the right common carotid artery demonstrated now significantly improved caliber and flow through the stented segment and intracranially. The proximal portion of the stent was noted to be incompletely apposed in the right common carotid artery distally. This necessitated the use of a second Xact stent this time an 8/10 mm x 40 mm dimension. Again this was prepped and purged in the usual manner and then advanced again using the rapid exchange technique. This stent was deployed with the distal portion of the stent telescoping into the mid segment of the first stent. The delivery mechanism was retrieved and removed. A control arteriogram performed through the balloon guide catheter in the right common carotid now demonstrated excellent apposition of the stents proximally and distally with significantly improved caliber of the right internal carotid proximally. A control arteriogram performed intracranially demonstrated wide patency of the right middle cerebral artery and the right anterior cerebral arteries with no change in the homogeneous opacification of the large tumor. Control arteriograms were then performed at 15 and 30 minutes post deployment of the 2 stents. These continued to demonstrate no significant intraluminal filling defects, or changes intracranially. A final control arteriogram was performed following removal of the exchange guidewire. There continued to be excellent flow through the right internal carotid artery proximally and distally with no evidence of intraluminal filling defects or occlusions. Throughout the procedure, the patient's hemodynamic status remained  stable. The 8 French balloon guide was then retrieved and removed. Manual compression was applied at the right groin puncture site due to significant atherosclerotic disease of the common femoral artery. Following hemostasis, the groin appeared soft. Distal pulses remained Dopplerable in the dorsalis pedis, and the posterior tibial  regions bilaterally unchanged. A CT of the brain performed following the procedure demonstrated no change in the mass effect secondary to the tumor or hemorrhagic complication within the cerebral tissue or within the tumor itself. Patient's general anesthesia was then reversed. Patient gradually recovered and was able to obey simple commands appropriately. The patient moved his right arm and leg spontaneously. There was no left sided weakness or the right gaze deviation noted prior to the procedure. The patient did not complain of any headaches, nausea or vomiting. He was transferred to the floor for post revascularization management. 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 PLAN: Follow-up in the clinic 4 to 6 weeks post discharge. Electronically Signed   By: Luanne Bras M.D.   On: 11/22/2019 12:14    Labs:  Basic Metabolic Panel: Recent Labs  Lab 12/17/19 1804  NA 142  K 3.8  CL 104  CO2 31  GLUCOSE 126*  BUN 23  CREATININE 1.38*  CALCIUM 9.8    CBC: Recent Labs  Lab 12/13/19 0601 12/17/19 1804  WBC  --  6.9  NEUTROABS  --  4.4  HGB  --  13.1  HCT  --  39.8  MCV  --  97.5  PLT 116* 151    CBG: No results for input(s): GLUCAP in the last 168 hours.  Family history.  Mother with Alzheimer's disease Father with CAD Sister with diabetes Brother with stomach cancer.  Denies any colon cancer esophageal cancer or rectal cancer  Brief HPI:   Charles Sherman is a 74 y.o.  right-handed male with history of diastolic congestive heart failure, hypertension, OSA on CPAP tobacco abuse as well as right medial sphenoid wing meningioma followed by neurosurgery.  Per chart review lives with spouse reportedly independent prior to admission.  Presented 11/14/2019 with left-sided weakness.  Cranial CT scan showed acute infarct right MCA territory in the right frontal parietal lobe extending into the occipital lobe.  No acute hemorrhage.  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  There was some vasogenic edema in the right temporal parietal lobe 9 mm midline shift to the left again unchanged.  CT angiogram of head and neck with extensive atherosclerotic calcification of the cavernous carotids bilaterally with mild stenosis.  The tumor abuts the right cavernous and supraclinoid internal carotid artery without encasement or occlusion.  Neurosurgery did review scanned no current plan for arteriography or stenting at this time.  Admission chemistries glucose 121 WBC 15,000.  Echocardiogram showed no significant regurgitation or distraction of the valve to suggest endocarditis.  Left ventricular ejection fraction 60 to 65% no wall motion abnormality grade 1 diastolic dysfunction.  No current plan for TEE after echocardiogram reviewed by cardiology services.  Cerebral angiogram completed showing high-grade stenosis of approximately 75% associated with soft smooth plaque.  Approximate 60% stenosis of the right VA proximal and 60% left VA origin.  Mass-effect on right MCA secondary to large sphenoid wing meningioma without stenosis or intraluminal filling defect.  Patient did undergo right ICA revascularization with stent assisted angioplasty.  Maintained on aspirin and Brilinta.  Was on a dysphagia #2 thin liquid diet.  Therapy evaluations completed and patient was admitted for a comprehensive rehab program   Hospital Course: Charles Sherman was admitted to rehab  11/28/2019 for inpatient therapies to consist of PT, ST and OT at least three  hours five days a week. Past admission physiatrist, therapy team and rehab RN have worked together to provide customized collaborative inpatient rehab.  Pertaining to patient's scattered right MCA ACA infarcts in the setting of right ICA bulb and siphon stenosis and right MCA narrowing due to large right meningioma status post ICA revascularization remained stable stent assisted angioplasty 11/21/2019.  Patient remained on aspirin as well as Brilinta.  In regards to patient's right medial sphenoid wing meningioma followed by neurosurgery Decadron protocol completed no current indications for surgical intervention.  Blood pressure remained controlled on Norvasc 10 mg daily, Lasix 20 mg twice daily Imdur 120 mg daily as well as Zaroxolyn.  Patient exhibited no signs of fluid overload.  History of COPD with tobacco abuse receiving counsel regards to cessation of nicotine products.  Protonix ongoing for GERD.  His diet was advanced to regular consistency.  Morbid obesity BMI 30.53 dietary follow-up.  Right eye closure improved blepharitis/blepharospasm improved weaned from sulfacetamide and prednisolone drops.  Monitoring of renal function CKD baseline 1.5 latest creatinine 1.38.  Mild thrombocytopenia platelets 116,000 no bleeding episodes.   Blood pressures were monitored on TID basis and controlled     Rehab course: During patient's stay in rehab weekly team conferences were held to monitor patient's progress, set goals and discuss barriers to discharge. At admission, patient required +2 physical assist sit to stand max assist supine to sit.  Max assist upper body bathing total assist lower body bathing total assist upper body dressing total assist lower body dressing  Physical exam.  Blood pressure 118/70 pulse 80 temperature 98 respirations 18 oxygen saturations 92% room air Constitutional.  No acute distress General.  Alert  oriented x3 HEENT Head.  Normocephalic and atraumatic Eyes.  Pupils round and reactive to light Neck.  Supple nontender no JVD without thyromegaly Cardiac regular rate rhythm without extra sounds or murmur heard Abdomen.  Soft nontender positive bowel sounds without rebound Respiratory effort normal no respiratory distress without wheeze Skin.  Clean and intact Neurological.  Cognitively appropriate cranial nerves II through XII intact sensory exam normal reflexes 2+ in all fours no tremors.  Motor function is grossly graded 5/5 on the right has severe left side weakness 0 out of 5.  He/  has had improvement in activity tolerance, balance, postural control as well as ability to compensate for deficits. He/ has had improvement in functional use RUE/LUE  and RLE/LLE as well as improvement in awareness.  Required max assist to don pants supine in bed.  Patient able to come to sitting edge of bed with minimal assist and verbal cues moderate assist stand pivot transfers to wheelchair.  He was able to complete sit to stand with minimal assist and rolling walker using mirror for visual feedback.  He was able to remain standing for greater than 2 minutes prior to fatiguing.  He ambulated on treadmill 3 minutes 129 feet.  ADL supine to sit with moderate assist sit pivot transfer to bed wheelchair moderate assist.  Completed bathing and grooming task wheelchair level set up for oral care washing face moderate assist for upper body bathing and donning shirt with moderate cues lower body bathing moderate assist.  Wife was unable to provide physical assistance at home recommendations for skilled nursing facility       Disposition: Skilled nursing facility placement    Diet: Regular  Special Instructions: No driving smoking or alcohol  Medications at discharge 1.  Tylenol as needed 2.  Albuterol inhaler 2  puffs every 6 hours as needed 3.  Norvasc 10 mg p.o. daily 4.  Lacri-Lube to right eye daily 5.   Aspirin 81 mg p.o. daily 6.  Lipitor 80 mg p.o. daily 7.  Decadron 0.5 mg every 12 hours x3 days and stop 8.  Lasix 20 mg p.o. twice daily 9.  Imdur 120 mg p.o. daily 10.  Zaroxolyn 2.5 mg once per day on Monday 11.  Multivitamin daily 12.  Protonix 40 mg p.o. daily 13.  MiraLAX daily 14.  Klor-Con 10 mEq p.o. twice daily 15.sulfacetamide 10% 1 drop right eye 2 times daily x1 week and stop 16.  Brilinta 90 mg p.o. twice daily 17.  Nitroglycerin as needed chest pain  30-35 minutes were spent completing discharge summary and discharge planning  Discharge Instructions    Ambulatory referral to Neurology   Complete by: As directed    An appointment is requested in approximately 4 weeks right MCA/ACA infarction   Ambulatory referral to Physical Medicine Rehab   Complete by: As directed    Follow-up 1 month right MCA/ACA infarct.  Patient for skilled nursing facility placement       Follow-up Information    Kirsteins, Luanna Salk, MD Follow up.   Specialty: Physical Medicine and Rehabilitation Why: Office to call for appointment Contact information: [place] [address] [phone]        Luanne Bras, MD Follow up.   Specialties: Interventional Radiology, Radiology Why: Call for appointment Contact information: [place] [address] [phone]        Kary Kos, MD Follow up.   Specialty: Neurosurgery Why: Call for follow-up concerning history of meningioma Contact information: [address] [place] [address] [phone]               Signed: Lavon Paganini Ballou 12/20/2019, 5:30 AM

## 2019-12-19 NOTE — Progress Notes (Signed)
Patient ID: Charles Sherman, male   DOB: Jun 06, 1945, 74 y.o.   MRN: [id number]   Patient has been offered a bed at [facility] and able to transition tomorrow if medically stable.  [place], [place]

## 2019-12-19 NOTE — Progress Notes (Signed)
Patient ID: [name], male   DOB: 12-05-1945, 74 y.o.   MRN: [id number]   SW called to follow up with [name]) in reference to ramp and walk-in shower. [name] is still out of office. Will continue to follow up.  Charles Sherman, Charles Sherman

## 2019-12-19 NOTE — Progress Notes (Signed)
[place] PHYSICAL MEDICINE & REHABILITATION PROGRESS NOTE   Subjective/Complaints:  No issues overnite , family in visiting , interested in SNF as next step   ROS: Denies CP, shortness of breath, N/V/D  Objective:   No results found. Recent Labs    12/17/19 1804  WBC 6.9  HGB 13.1  HCT 39.8  PLT 151   Recent Labs    12/17/19 1804  NA 142  K 3.8  CL 104  CO2 31  GLUCOSE 126*  BUN 23  CREATININE 1.38*  CALCIUM 9.8    Intake/Output Summary (Last 24 hours) at 12/19/2019 0814 Last data filed at 12/19/2019 0616 Gross per 24 hour  Intake 654 ml  Output --  Net 654 ml        Physical Exam: Vital Signs Blood pressure (!) 141/68, pulse 64, temperature (!) 97.5 F (36.4 C), temperature source Oral, resp. rate 18, height 5' 11"  (1.803 m), weight 99.3 kg, SpO2 100 %.   General: No acute distress Mood and affect are appropriate Heart: Regular rate and rhythm no rubs murmurs or extra sounds Lungs: Clear to auscultation, breathing unlabored, no rales or wheezes Abdomen: Positive bowel sounds, soft nontender to palpation, nondistended Extremities: No clubbing, cyanosis, or edema Skin: No evidence of breakdown, no evidence of rash  Musculoskeletal: Full range of motion in all 4 extremities. No joint swelling  Neuro: Alert Motor: LLE: Hip ext 2-/5, knee extension 3-/5, ankle dorsiflexion 0/5, improving Left UE shoulder abduction, elbow flexion/extension 2-, grip 2-  Assessment/Plan: 1. Functional deficits secondary to Right ICA distribution infarcts  which require 3+ hours per day of interdisciplinary therapy in a comprehensive inpatient rehab setting.  Physiatrist is providing close team supervision and 24 hour management of active medical problems listed below.  Physiatrist and rehab team continue to assess barriers to discharge/monitor patient progress toward functional and medical goals  Care Tool:  Bathing    Body parts bathed by patient: Front perineal  area, Right upper leg, Left upper leg, Right lower leg, Left arm, Chest, Abdomen, Face   Body parts bathed by helper: Buttocks, Left lower leg, Right arm     Bathing assist Assist Level: Moderate Assistance - Patient 50 - 74%     Upper Body Dressing/Undressing Upper body dressing   What is the patient wearing?: Pull over shirt    Upper body assist Assist Level: Moderate Assistance - Patient 50 - 74%    Lower Body Dressing/Undressing Lower body dressing      What is the patient wearing?: Incontinence brief, Pants     Lower body assist Assist for lower body dressing: Maximal Assistance - Patient 25 - 49%     Toileting Toileting    Toileting assist Assist for toileting: 2 Helpers     Transfers Chair/bed transfer  Transfers assist     Chair/bed transfer assist level: Moderate Assistance - Patient 50 - 74%     Locomotion Ambulation   Ambulation assist   Ambulation activity did not occur: Safety/medical concerns  Assist level: Moderate Assistance - Patient 50 - 74% Assistive device: Walker-rolling Max distance: 108   Walk 10 feet activity   Assist     Assist level: Minimal Assistance - Patient > 75% Assistive device: Walker-rolling   Walk 50 feet activity   Assist    Assist level: Moderate Assistance - Patient - 50 - 74% Assistive device: Walker-rolling    Walk 150 feet activity   Assist  Walk 10 feet on uneven surface  activity   Assist Walk 10 feet on uneven surfaces activity did not occur: Safety/medical concerns         Wheelchair     Assist Will patient use wheelchair at discharge?: Yes Type of Wheelchair: Manual Wheelchair activity did not occur: Safety/medical concerns  Wheelchair assist level: Moderate Assistance - Patient 50 - 74% Max wheelchair distance: 161f    Wheelchair 50 feet with 2 turns activity    Assist        Assist Level: Moderate Assistance - Patient 50 - 74%   Wheelchair 150  feet activity     Assist      Assist Level: Moderate Assistance - Patient 50 - 74%, Maximal Assistance - Patient 25 - 49%   Blood pressure (!) 141/68, pulse 64, temperature (!) 97.5 F (36.4 C), temperature source Oral, resp. rate 18, height 5' 11"  (1.803 m), weight 99.3 kg, SpO2 100 %.   Medical Problem List and Plan: 1.  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  Status post right ICA revascularization with stent assisted angioplasty 11/21/2019  Continue CIR Team conference today please see physician documentation under team conference tab, met with team  to discuss problems,progress, and goals. Formulized individual treatment plan based on medical history, underlying problem and comorbidities.  cont PT, OT, SLP- some motor recovery noted   WHO/PRAFO nightly, PRAFO not seen in room 2.  Antithrombotics: -DVT/anticoagulation: Lovenox plt pnd             -antiplatelet therapy: Aspirin 81 mg daily and Brilinta 90 mg twice daily 3. Pain Management: prn meds 4. Mood: Provide emotional support             -antipsychotic agents: N/A 5. Neuropsych: This patient is capable of making decisions on his own behalf. 6. Skin/Wound Care: Routine skin checks 7. Fluids/Electrolytes/Nutrition: Routine in and outs 8.  Right medial sphenoid wing meningioma.  Followed by neurosurgery.  Decadron protocol.  No current indications for surgical intervention at this time  Continue to wean 9.  Hypertension.  Norvasc 10 mg daily, Zaroxolyn 2.5 mg 1/day on Monday, Lopressor 12.5 mg twice daily, Imdur 120 mg daily.  Vitals:   12/18/19 1906 12/19/19 0600  BP: (!) 110/59 (!) 141/68  Pulse: 63 64  Resp: 16 18  Temp: 97.9 F (36.6 C) (!) 97.5 F (36.4 C)  SpO2: 95% 100%   Relatively controlled on 10/27 10.  Diastolic congestive heart failure.  metoprolol,  Lasix 40 mg twice daily.  Monitor for any signs of fluid overload  Lasix decreased to 223mBID  Daily weights  Filed Weights   12/17/19 0444 12/18/19 0534 12/19/19 0500  Weight: 102.6 kg 102.8 kg 99.3 kg   Stable on 11/2709.  Hyperlipidemia.  Lipitor 12.  History of COPD/ tobacco abuse.  Counseling.  Albuterol inhaler as needed 13.  GERD.  Protonix 14.  Post stroke dysphagia.  Advanced to regular thins with supervision.  Follow-up speech therapy 15. Bradycardic:on BB monitor TID., D/Ced BB  , checked 10/8 EKG sinus brady rate 49 Vitals:   12/18/19 1906 12/19/19 0600  BP: (!) 110/59 (!) 141/68  Pulse: 63 64  Resp: 16 18  Temp: 97.9 F (36.6 C) (!) 97.5 F (36.4 C)  SpO2: 95% 100%  some lability but overall  controlled on 10/27 16.  Morbid obesity: Encourage weight loss 17.  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  CKD- baseline ~1.5 when looking back to 4/21   Creatinine 1.38 on 10/18, 1.38 on 10/25  Encourage fluids  Continue to monitor 19.  Thrombocytopenia  Platelets 116 on 10/21, trending down, labs ordered for tomorrow 20.  Slow transit constipation  Bowel meds ordered on 10/23  Improving  LOS: 21 days A FACE TO FACE EVALUATION WAS PERFORMED  Charlett Blake 12/19/2019, 8:14 AM

## 2019-12-19 NOTE — Progress Notes (Signed)
Patient ID: Charles Sherman, male   DOB: Aug 04, 1945, 74 y.o.   MRN: [id number] Team Conference Report to Patient/Family  Team Conference discussion was reviewed with the patient and caregiver, including goals, any changes in plan of care and target discharge date.  Patient and caregiver express understanding and are in agreement.  The patient has a target discharge date of  (SNF pending).  Dyanne Iha 12/19/2019, 11:23 AM

## 2019-12-20 NOTE — Progress Notes (Signed)
Patient discharged to [facility], transported by [facility].

## 2019-12-20 NOTE — Progress Notes (Signed)
Patient ID: Charles Sherman, male   DOB: 08-19-1945, 74 y.o.   MRN: [id number]   Patient transportation set for 12:00 PM. Discharge packet left at nursing station. Patient will admit to room B16, Bed 1. Nurse to call for report: Shanon Brow at [place], [place]

## 2019-12-20 NOTE — Progress Notes (Signed)
[place] PHYSICAL MEDICINE & REHABILITATION PROGRESS NOTE   Subjective/Complaints:  Has SNF bed  ROS: Denies CP, shortness of breath, N/V/D  Objective:   No results found. Recent Labs    12/17/19 1804  WBC 6.9  HGB 13.1  HCT 39.8  PLT 151   Recent Labs    12/17/19 1804  NA 142  K 3.8  CL 104  CO2 31  GLUCOSE 126*  BUN 23  CREATININE 1.38*  CALCIUM 9.8    Intake/Output Summary (Last 24 hours) at 12/20/2019 0755 Last data filed at 12/19/2019 2009 Gross per 24 hour  Intake 804 ml  Output 240 ml  Net 564 ml        Physical Exam: Vital Signs Blood pressure 117/67, pulse 65, temperature 99.9 F (37.7 C), temperature source Oral, resp. rate 17, height 5\' 11"  (1.803 m), weight 98.7 kg, SpO2 97 %.    General: No acute distress Mood and affect are appropriate Heart: Regular rate and rhythm no rubs murmurs or extra sounds Lungs: Clear to auscultation, breathing unlabored, no rales or wheezes Abdomen: Positive bowel sounds, soft nontender to palpation, nondistended Extremities: No clubbing, cyanosis, or edema Skin: No evidence of breakdown, no evidence of rash  Musculoskeletal: Full range of motion in all 4 extremities. No joint swelling  Neuro: Alert Motor: LLE: Hip ext 2-/5, knee extension 3-/5, ankle dorsiflexion 0/5, improving Left UE shoulder abduction, elbow flexion/extension 2-, grip 2-  Assessment/Plan: 1. Functional deficits secondary to Right ICA distribution infarcts  Stable for D/C today F/u PCP in SNF F/u PM&R 4 weeks See D/C summary See D/C instructions Care Tool:  Bathing    Body parts bathed by patient: Front perineal area, Right upper leg, Left upper leg, Right lower leg, Left arm, Chest, Abdomen, Face   Body parts bathed by helper: Right arm, Left lower leg, Buttocks     Bathing assist Assist Level: Moderate Assistance - Patient 50 - 74%     Upper Body Dressing/Undressing Upper body dressing   What is the patient wearing?:  Pull over shirt    Upper body assist Assist Level: Moderate Assistance - Patient 50 - 74%    Lower Body Dressing/Undressing Lower body dressing      What is the patient wearing?: Incontinence brief, Pants     Lower body assist Assist for lower body dressing: Maximal Assistance - Patient 25 - 49%     Toileting Toileting    Toileting assist Assist for toileting: Moderate Assistance - Patient 50 - 74%     Transfers Chair/bed transfer  Transfers assist     Chair/bed transfer assist level: Moderate Assistance - Patient 50 - 74%     Locomotion Ambulation   Ambulation assist   Ambulation activity did not occur: Safety/medical concerns  Assist level: Moderate Assistance - Patient 50 - 74% Assistive device: Walker-rolling Max distance: 65   Walk 10 feet activity   Assist     Assist level: Minimal Assistance - Patient > 75% Assistive device: Walker-rolling   Walk 50 feet activity   Assist    Assist level: Moderate Assistance - Patient - 50 - 74% Assistive device: Walker-rolling    Walk 150 feet activity   Assist           Walk 10 feet on uneven surface  activity   Assist Walk 10 feet on uneven surfaces activity did not occur: Safety/medical concerns         Wheelchair     Assist Will patient  use wheelchair at discharge?: Yes Type of Wheelchair: Manual Wheelchair activity did not occur: Safety/medical concerns  Wheelchair assist level: Moderate Assistance - Patient 50 - 74% Max wheelchair distance: 133ft    Wheelchair 50 feet with 2 turns activity    Assist        Assist Level: Moderate Assistance - Patient 50 - 74%   Wheelchair 150 feet activity     Assist      Assist Level: Moderate Assistance - Patient 50 - 74%, Maximal Assistance - Patient 25 - 49%   Blood pressure 117/67, pulse 65, temperature 99.9 F (37.7 C), temperature source Oral, resp. rate 17, height 5\' 11"  (1.803 m), weight 98.7 kg, SpO2 97  %.   Medical Problem List and Plan: 1.  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  Status post right ICA revascularization with stent assisted angioplasty 11/21/2019   Stable for SNF  WHO/PRAFO nightly, PRAFO not seen in room 2.  Antithrombotics: -DVT/anticoagulation: Lovenox plt pnd             -antiplatelet therapy: Aspirin 81 mg daily and Brilinta 90 mg twice daily 3. Pain Management: prn meds 4. Mood: Provide emotional support             -antipsychotic agents: N/A 5. Neuropsych: This patient is capable of making decisions on his own behalf. 6. Skin/Wound Care: Routine skin checks 7. Fluids/Electrolytes/Nutrition: Routine in and outs 8.  Right medial sphenoid wing meningioma.  Followed by neurosurgery.  Decadron protocol.  No current indications for surgical intervention at this time  Continue to wean 9.  Hypertension.  Norvasc 10 mg daily, Zaroxolyn 2.5 mg 1/day on Monday, Lopressor 12.5 mg twice daily, Imdur 120 mg daily.  Vitals:   12/19/19 1905 12/20/19 0502  BP: 102/66 117/67  Pulse: 85 65  Resp: 18 17  Temp: 97.9 F (36.6 C) 99.9 F (37.7 C)  SpO2: 97% 97%   Relatively controlled on 10/28 10.  Diastolic congestive heart failure.  metoprolol, Lasix 40 mg twice daily.  Monitor for any signs of fluid overload  Lasix decreased to 20mg  BID  Daily weights  Filed Weights   12/18/19 0534 12/19/19 0500 12/20/19 0502  Weight: 102.8 kg 99.3 kg 98.7 kg   Stable on 11/2709.  Hyperlipidemia.  Lipitor 12.  History of COPD/ tobacco abuse.  Counseling.  Albuterol inhaler as needed 13.  GERD.  Protonix 14.  Post stroke dysphagia.  Advanced to regular thins with supervision.  Follow-up speech therapy 15. Bradycardic:on BB monitor TID., D/Ced BB  , checked 10/8 EKG sinus brady rate 49 Vitals:   12/19/19 1905 12/20/19 0502  BP:  102/66 117/67  Pulse: 85 65  Resp: 18 17  Temp: 97.9 F (36.6 C) 99.9 F (37.7 C)  SpO2: 97% 97%  some lability but overall  controlled on 10/27 16.  Morbid obesity: Encourage weight loss 17.  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  CKD- baseline ~1.5 when looking back to 4/21   Creatinine 1.38 on 10/18, 1.38 on 10/25  Encourage fluids  Continue to monitor 19.  Thrombocytopenia  Platelets 116 on 10/21, trending down, labs ordered for tomorrow 20.  Slow transit constipation  Bowel meds ordered on 10/23  Improving  LOS: 22 days A FACE TO [place]  E Lissette Schenk 12/20/2019, 7:55 AM

## 2020-01-24 ENCOUNTER — Encounter: Payer: No Typology Code available for payment source | Admitting: Physical Medicine & Rehabilitation

## 2020-01-25 ENCOUNTER — Other Ambulatory Visit: Payer: Self-pay | Admitting: Neurological Surgery

## 2020-01-25 ENCOUNTER — Other Ambulatory Visit (HOSPITAL_COMMUNITY): Payer: Self-pay | Admitting: Neurological Surgery

## 2020-01-25 DIAGNOSIS — D320 Benign neoplasm of cerebral meninges: Secondary | ICD-10-CM

## 2020-01-29 ENCOUNTER — Encounter
Payer: No Typology Code available for payment source | Attending: Physical Medicine & Rehabilitation | Admitting: Physical Medicine & Rehabilitation

## 2020-01-29 ENCOUNTER — Other Ambulatory Visit: Payer: Self-pay

## 2020-01-29 ENCOUNTER — Encounter: Payer: Self-pay | Admitting: Physical Medicine & Rehabilitation

## 2020-01-29 VITALS — BP 96/61 | HR 82 | Temp 98.7°F

## 2020-01-29 DIAGNOSIS — I69354 Hemiplegia and hemiparesis following cerebral infarction affecting left non-dominant side: Secondary | ICD-10-CM | POA: Insufficient documentation

## 2020-01-29 NOTE — Patient Instructions (Signed)
May consider [facility] options for skilled nursing

## 2020-01-29 NOTE — Progress Notes (Signed)
Subjective:    Patient ID: Charles Sherman, male    DOB: 09/15/1945, 74 y.o.   MRN: [id number] 74 y.o. right-handed male with history of diastolic congestive heart failure, hypertension, OSA on CPAP tobacco abuse as well as right medial sphenoid wing meningioma followed by neurosurgery.  Per chart review lives with spouse reportedly independent prior to admission.  Presented 11/14/2019 with left-sided weakness.  Cranial CT scan showed acute infarct right MCA territory in the right frontal parietal lobe extending into the occipital lobe.  No acute hemorrhage.  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  There was some vasogenic edema in the right temporal parietal lobe 9 mm midline shift to the left again unchanged.  CT angiogram of head and neck with extensive atherosclerotic calcification of the cavernous carotids bilaterally with mild stenosis.  The tumor abuts the right cavernous and supraclinoid internal carotid artery without encasement or occlusion.  Neurosurgery did review scanned no current plan for arteriography or stenting at this time.  Admission chemistries glucose 121 WBC 15,000.  Echocardiogram showed no significant regurgitation or distraction of the valve to suggest endocarditis.  Left ventricular ejection fraction 60 to 65% no wall motion abnormality grade 1 diastolic dysfunction.  No current plan for TEE after echocardiogram reviewed by cardiology services.  Cerebral angiogram completed showing high-grade stenosis of approximately 75% associated with soft smooth plaque.  Approximate 60% stenosis of the right VA proximal and 60% left VA origin.  Mass-effect on right MCA secondary to large sphenoid wing meningioma without stenosis or intraluminal filling defect.  Patient did undergo right ICA revascularization with stent assisted angioplasty.  Maintained on aspirin and Brilinta.  Admit date: 11/28/2019 Discharge date: 12/20/2019  HPI Patient returns today for  follow-up appointment.  He is living in a skilled nursing facility.  He is in the subacute section.  Per patient report he is receiving PT and OT 3 to 5 days/week.  The patient had a fall but did not have any injury 1 or 2 days ago.  The patient is unable to use a urinal He is accompanied by his sister who is concerned that the patient is not getting enough therapy.  She states she was told that he would be seen by therapy 3 hours a day 5 days a week.  We discussed that that is the frequency of in-hospital rehabilitation but not SNF rehabilitation.  The patient has completed his in-hospital rehabilitation and has not had a new stroke or other qualifying event. He has been seen by neurosurgery for follow-up of right sphenoid wing meningioma.  The patient is scheduled for MRI of the brain in about 1 week.  The patient also has an appointment for neurology follow-up.  The patient denies shoulder pain, no spasms in the left upper or left lower limb. Pain Inventory Average Pain 0 Pain Right Now 0 My pain is nopain  LOCATION OF PAIN   BOWEL Number of stools per week: 3 Oral laxative use No  Type of laxative na Enema or suppository use No  History of colostomy No  Incontinent Yes   BLADDER Normal In and out cath, frequency na Able to self cath na Bladder incontinence Yes  Frequent urination No  Leakage with coughing No  Difficulty starting stream No  Incomplete bladder emptying No    Mobility ability to climb steps?  no do you drive?  no use a wheelchair needs help with transfers  Function retired I need assistance with the following:  feeding, dressing, bathing, toileting, meal prep, household duties and shopping  Neuro/Psych bladder control problems weakness trouble walking depression  Prior Studies HFU  Physicians involved in your care HFU   Family History  Problem Relation Age of Onset  . Alzheimer's disease Mother   . Heart disease Father   . Diabetes Sister   .  Stomach cancer Brother    Social History   Socioeconomic History  . Marital status: Married    Spouse name: Not on file  . Number of children: Not on file  . Years of education: Not on file  . Highest education level: Not on file  Occupational History  . Not on file  Tobacco Use  . Smoking status: Current Every Day Smoker    Packs/day: 0.50    Types: Cigarettes  . Smokeless tobacco: Never Used  Substance and Sexual Activity  . Alcohol use: Yes    Alcohol/week: 1.0 standard drink    Types: 1 Shots of liquor per week    Comment: maybe every 6 mths   . Drug use: Never  . Sexual activity: Not Currently  Other Topics Concern  . Not on file  Social History Narrative  . Not on file   Social Determinants of Health   Financial Resource Strain:   . Difficulty of Paying Living Expenses: Not on file  Food Insecurity:   . Worried About Charity fundraiser in the Last Year: Not on file  . Ran Out of Food in the Last Year: Not on file  Transportation Needs:   . Lack of Transportation (Medical): Not on file  . Lack of Transportation (Non-Medical): Not on file  Physical Activity:   . Days of Exercise per Week: Not on file  . Minutes of Exercise per Session: Not on file  Stress:   . Feeling of Stress : Not on file  Social Connections:   . Frequency of Communication with Friends and Family: Not on file  . Frequency of Social Gatherings with Friends and Family: Not on file  . Attends Religious Services: Not on file  . Active Member of Clubs or Organizations: Not on file  . Attends Archivist Meetings: Not on file  . Marital Status: Not on file   Past Surgical History:  Procedure Laterality Date  . IR ANGIO INTRA EXTRACRAN SEL COM CAROTID INNOMINATE BILAT MOD SED  11/19/2019  . IR ANGIO VERTEBRAL SEL VERTEBRAL BILAT MOD SED  11/19/2019  . IR CT HEAD LTD  11/21/2019  . IR INTRAVSC STENT CERV CAROTID W/O EMB-PROT MOD SED INC ANGIO  11/21/2019  . IR US GUIDE VASC ACCESS RIGHT   11/19/2019  . PERCUTANEOUS CORONARY STENT INTERVENTION (PCI-S)    . RADIOLOGY WITH ANESTHESIA Right 11/21/2019   Procedure: RIGHT CAROTID STENTING;  Surgeon: Luanne Bras, MD;  Location: [facility];  Service: Radiology;  Laterality: Right;   Past Medical History:  Diagnosis Date  . CHF (congestive heart failure) ([place])   . COPD (chronic obstructive pulmonary disease) ([place])   . HTN (hypertension)   . Myocardial infarct ([place])   . OSA (obstructive sleep apnea)    on CPAP   BP 96/61   Pulse 82   Temp 98.7 F (37.1 C)   SpO2 92%   Opioid Risk Score:   Fall Risk Score:  `1  Depression screen PHQ 2/9  No flowsheet data found.    Family History  Problem Relation Age of Onset  . Alzheimer's disease Mother   .  Heart disease Father   . Diabetes Sister   . Stomach cancer Brother    Social History   Socioeconomic History  . Marital status: Married    Spouse name: Not on file  . Number of children: Not on file  . Years of education: Not on file  . Highest education level: Not on file  Occupational History  . Not on file  Tobacco Use  . Smoking status: Current Every Day Smoker    Packs/day: 0.50    Types: Cigarettes  . Smokeless tobacco: Never Used  Substance and Sexual Activity  . Alcohol use: Yes    Alcohol/week: 1.0 standard drink    Types: 1 Shots of liquor per week    Comment: maybe every 6 mths   . Drug use: Never  . Sexual activity: Not Currently  Other Topics Concern  . Not on file  Social History Narrative  . Not on file   Social Determinants of Health   Financial Resource Strain:   . Difficulty of Paying Living Expenses: Not on file  Food Insecurity:   . Worried About Charity fundraiser in the Last Year: Not on file  . Ran Out of Food in the Last Year: Not on file  Transportation Needs:   . Lack of Transportation (Medical): Not on file  . Lack of Transportation (Non-Medical): Not on file  Physical Activity:   . Days of Exercise per Week: Not on file   . Minutes of Exercise per Session: Not on file  Stress:   . Feeling of Stress : Not on file  Social Connections:   . Frequency of Communication with Friends and Family: Not on file  . Frequency of Social Gatherings with Friends and Family: Not on file  . Attends Religious Services: Not on file  . Active Member of Clubs or Organizations: Not on file  . Attends Archivist Meetings: Not on file  . Marital Status: Not on file   Past Surgical History:  Procedure Laterality Date  . IR ANGIO INTRA EXTRACRAN SEL COM CAROTID INNOMINATE BILAT MOD SED  11/19/2019  . IR ANGIO VERTEBRAL SEL VERTEBRAL BILAT MOD SED  11/19/2019  . IR CT HEAD LTD  11/21/2019  . IR INTRAVSC STENT CERV CAROTID W/O EMB-PROT MOD SED INC ANGIO  11/21/2019  . IR US GUIDE VASC ACCESS RIGHT  11/19/2019  . PERCUTANEOUS CORONARY STENT INTERVENTION (PCI-S)    . RADIOLOGY WITH ANESTHESIA Right 11/21/2019   Procedure: RIGHT CAROTID STENTING;  Surgeon: Luanne Bras, MD;  Location: [facility];  Service: Radiology;  Laterality: Right;   Past Medical History:  Diagnosis Date  . CHF (congestive heart failure) ([place])   . COPD (chronic obstructive pulmonary disease) ([place])   . HTN (hypertension)   . Myocardial infarct ([place])   . OSA (obstructive sleep apnea)    on CPAP   BP 96/61   Pulse 82   Temp 98.7 F (37.1 C)   SpO2 92%   Opioid Risk Score:   Fall Risk Score:  `1  Depression screen PHQ 2/9  No flowsheet data found.  Review of Systems     Objective:   Physical Exam Constitutional:      Appearance: He is obese.  HENT:     Head: Normocephalic and atraumatic.  Eyes:     Extraocular Movements: Extraocular movements intact.     Conjunctiva/sclera: Conjunctivae normal.     Pupils: Pupils are equal, round, and reactive to light.  Neurological:  Mental Status: He is alert.     Cranial Nerves: No dysarthria or facial asymmetry.     Coordination: Coordination abnormal.     Comments: Decreased memory for  hospital stay. He is alert today follows simple commands.  Motor strength 3 - at the left deltoid, bicep, tricep, grip, hip flexor, knee extensor, ankle dorsiflexor and plantar flexor Right side is 5/5 in the deltoid, bicep, tricep, grip, hip flexor, knee extensor, ankle dorsiflexor and plantar flexor  Patient has limited finger to thumb opposition he is able to touch the index finger once or twice but not the other fingers. The patient has intact light touch sensation in the left upper and left lower limb Patient is nonambulatory  Psychiatric:        Mood and Affect: Mood normal.    There is mild left neglect       Assessment & Plan:  #1.  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  He does have some cognitive deficits as well that are mild to moderate.  Overall he is recovering as expected from a functional standpoint.  We discussed time course of recovery and over the next month or so he should start plateauing in terms of physical functioning.  His cognitive skills may slowly improve over the next 6 months.  We discussed that I do not expect him to get back to his baseline prestroke. Recommend continue PT OT at subacute level, 3 to 5 days/week. Discussed with family difference between comprehensive intensive rehab versus subacute rehab.  We discussed that they can explore [facility] options as well as other SNF based rehabilitation centers.  Does not have a qualifying event for hospital readmission. Follow-up with neurosurgery, Dr. Ellene Route after the repeat MRI of the brain. Follow-up with Dr. Leonie Man from neurology January 10 Physical medicine rehab follow-up as needed basis Over half of the 35 min visit was spent counseling and coordinating care.  Spoke to sister, documented for SNF progress notes

## 2020-02-08 ENCOUNTER — Encounter (HOSPITAL_COMMUNITY): Payer: Self-pay | Admitting: *Deleted

## 2020-02-08 ENCOUNTER — Ambulatory Visit (HOSPITAL_COMMUNITY)
Admission: RE | Admit: 2020-02-08 | Discharge: 2020-02-08 | Disposition: A | Discharge status: Home / Self Care | Payer: No Typology Code available for payment source | Source: Ambulatory Visit | Attending: Neurological Surgery | Admitting: Neurological Surgery

## 2020-02-08 ENCOUNTER — Other Ambulatory Visit: Payer: Self-pay

## 2020-02-08 DIAGNOSIS — D320 Benign neoplasm of cerebral meninges: Secondary | ICD-10-CM | POA: Diagnosis present

## 2020-02-08 IMAGING — MR MR HEAD WO/W CM
9 of 13 series · 33 of 48 positions shown · IV contrast (gadavist)
Comparison: Head CT [DATE]; MRI of the brain DIMITAR

CLINICAL DATA: Benign neoplasm of cerebral meninges.

EXAM:
MRI HEAD WITHOUT AND WITH CONTRAST
TECHNIQUE: Multiplanar, multiecho pulse sequences of the brain and surrounding
structures were obtained without and with intravenous contrast.
CONTRAST:  10mL GADAVIST GADOBUTROL 1 MMOL/ML IV SOLN

[Series 4: DWI · axial · 3.0mm · 1.09mm/px · z∈[-60,+72]mm · 9 of 90 slices shown (1 of 4)]
[im 1/90]
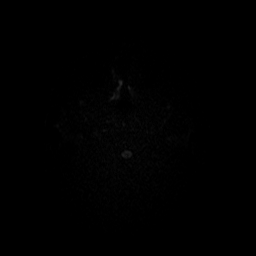
[im 12/90]
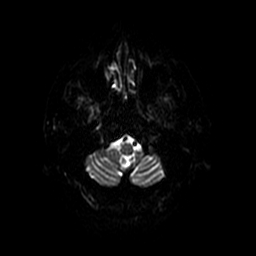
[im 23/90]
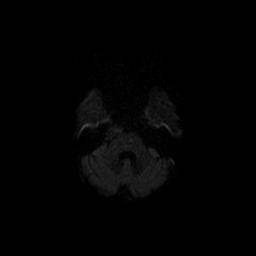
[im 34/90]
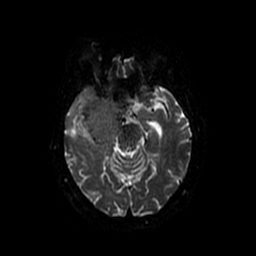
[im 45/90]
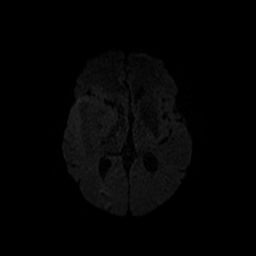
[im 56/90]
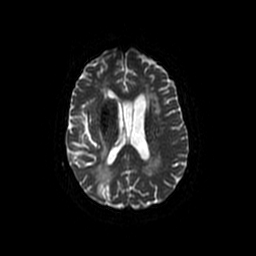
[im 67/90]
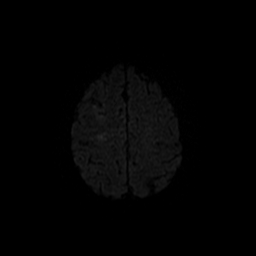
[im 78/90]
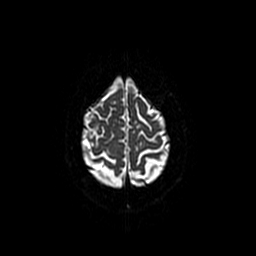
[im 90/90]
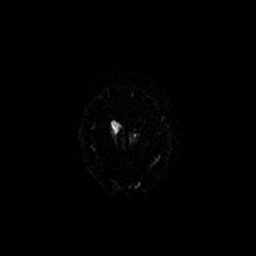

[Series 5: DWI · coronal · 5.0mm · 1.09mm/px · 6 of 74 slices shown (2 of 4)]
[im 1/74]
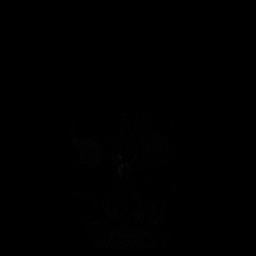
[im 15/74]
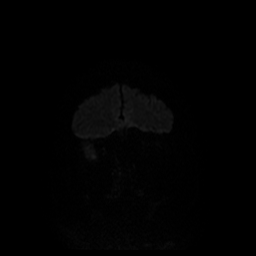
[im 30/74]
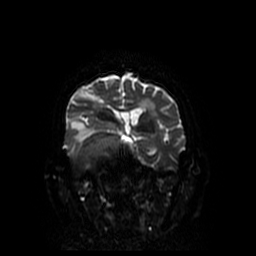
[im 44/74]
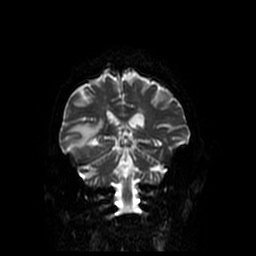
[im 59/74]
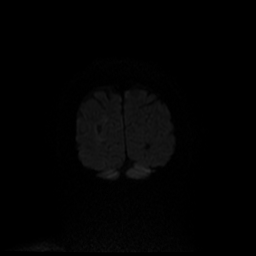
[im 74/74]
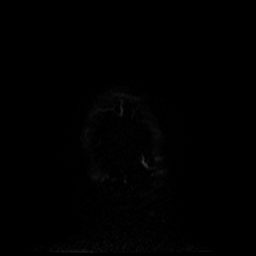

[Series 7: T2 · axial · 5.0mm · 0.45mm/px · 1 of 24 slices shown]
[im 1/24]
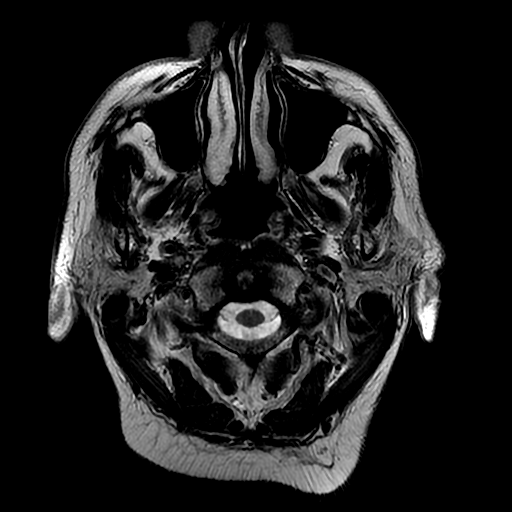

[Series 8: FLAIR · axial · 5.0mm · 0.45mm/px · z∈[-62,+75]mm · 2 of 24 slices shown]
[im 1/24]
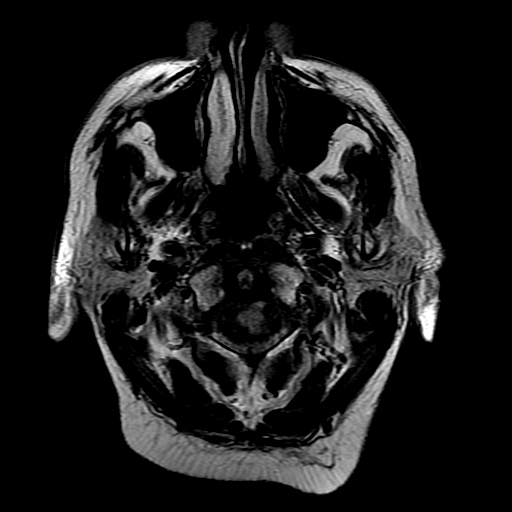
[im 24/24]
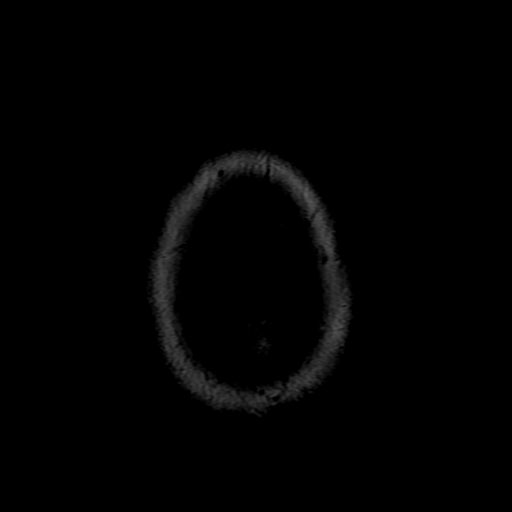

[Series 12: T1 post-contrast · axial · 3.0mm · 0.47mm/px · z∈[-55,+91]mm · 4 of 50 slices shown (1 of 3)]
[im 1/50]
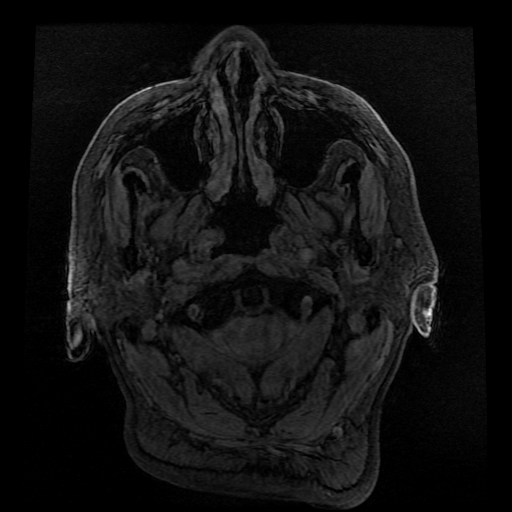
[im 17/50]
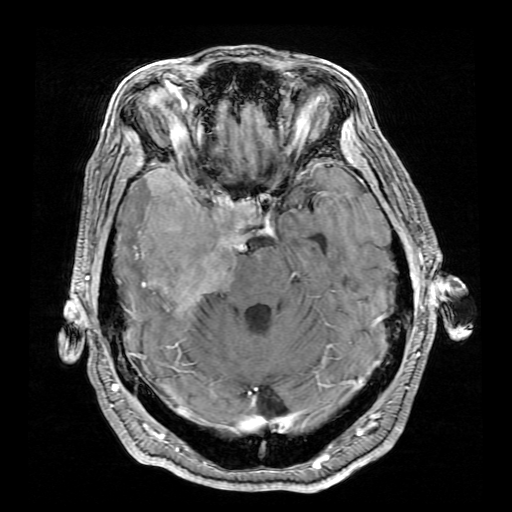
[im 33/50]
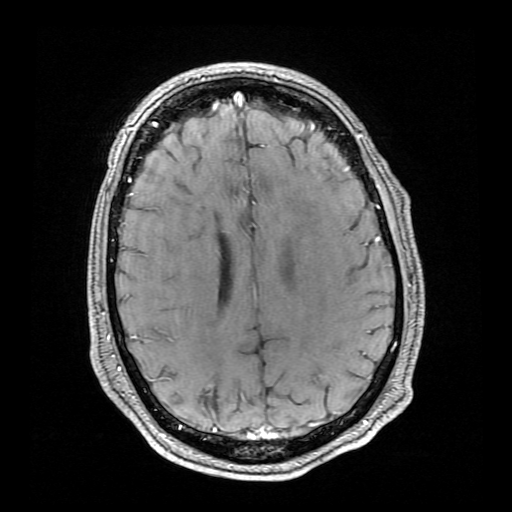
[im 50/50]
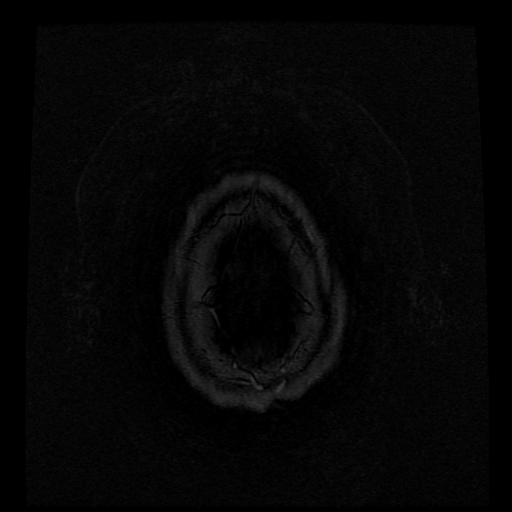

[Series 13: T1 post-contrast · coronal · 5.0mm · 0.39mm/px · 2 of 28 slices shown (2 of 3)]
[im 1/28]
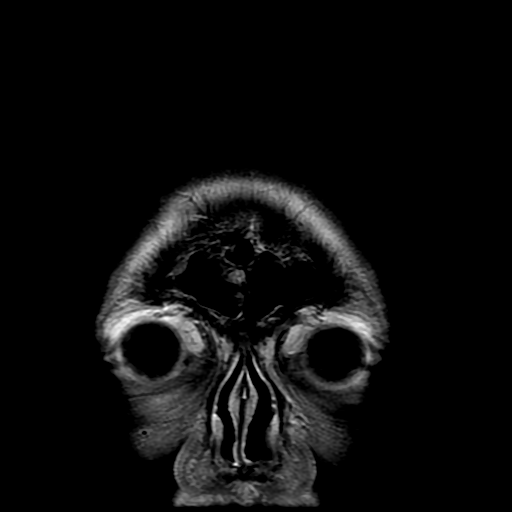
[im 28/28]
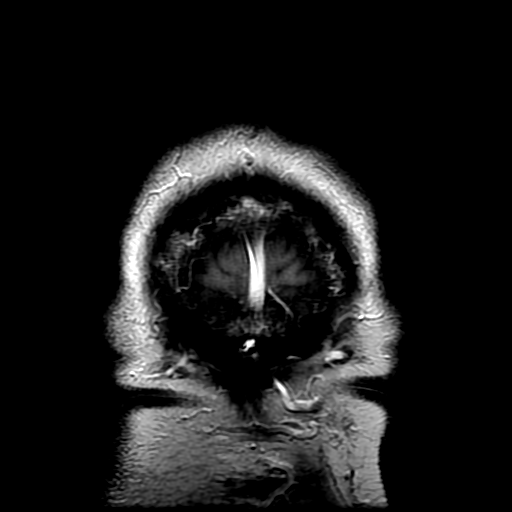

[Series 14: T1 post-contrast · sagittal · 5.0mm · 0.47mm/px · 2 of 25 slices shown (3 of 3)]
[im 1/25]
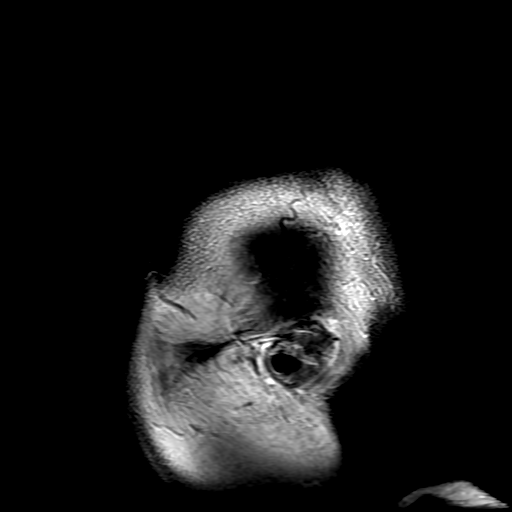
[im 25/25]
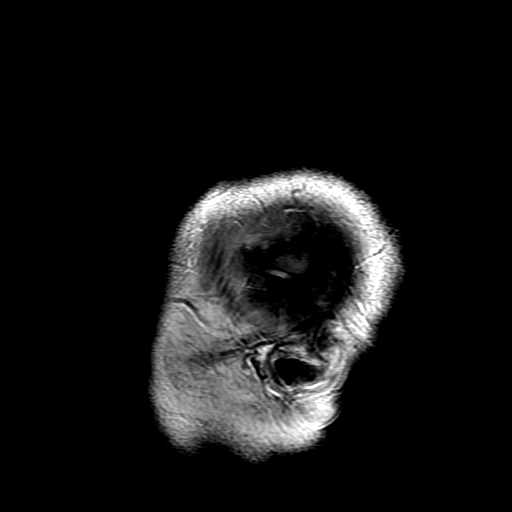

[Series 400: DWI · axial · 3.0mm · 1.09mm/px · z∈[-60,+72]mm · 4 of 45 slices shown (3 of 4)]
[im 1/45]
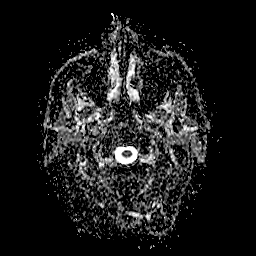
[im 15/45]
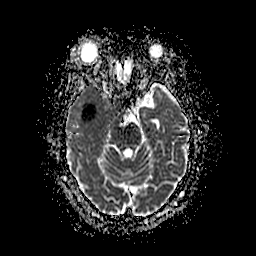
[im 30/45]
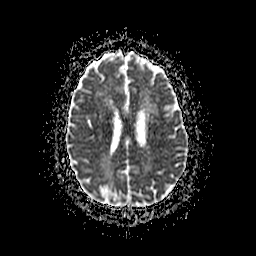
[im 45/45]
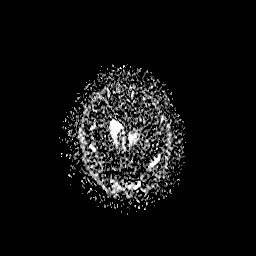

[Series 500: DWI · coronal · 5.0mm · 1.09mm/px · 3 of 37 slices shown (4 of 4)]
[im 1/37]
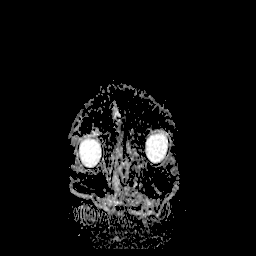
[im 19/37]
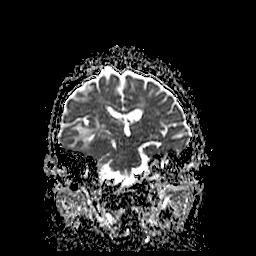
[im 37/37]
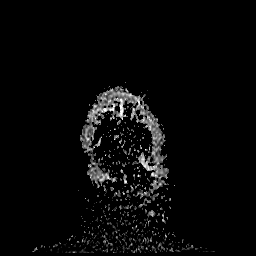

[33 of 48 positions shown; findings below may reference images not displayed]

FINDINGS: Brain: Large dural-based extra-axial lesion centered on the sphenoid
wing in the right middle cranial fossa, extending into the anterior
cranial fossa along the lesser sphenoid wing, posteriorly along the
tentorium, inferiorly along the petrous apex and clivus and medially
to involve the right cavernous sinus and sella. Lesion measures
approximately 67 x 58 x 42 mm (AP, T, cc), not significantly changed
from prior MRI. The lesion involves the right optic nerve, cavernous
and paraclinoid right ICA, displaces supraclinoid right ICA and
right MCA M1 and M2 segments abuts the basilar artery and displaces
the right PCA. There is significant mass effect on the adjacent
brain parenchyma including right frontotemporal, insular regions,
basal ganglia, right thalamus, right cerebral peduncle and pons with
associated parenchymal edema and indistinct cortical ribbon along
the inferior temporal lobe. Leftward midline shift measures
approximately 4 mm.

A few foci of increased signal within the right cerebral hemisphere
corresponding to T2 shine through. No acute infarct. Multiple foci
of encephalomalacia and gliosis within the right frontoparietal,
posterior temporal and occipital regions related to prior infarcts.
Scattered and confluent foci of T2 hyperintensity within the white
matter of the cerebral hemispheres, nonspecific, most likely related
to chronic microangiopathy.

Vascular: Normal flow voids.

Skull and upper cervical spine: Normal marrow signal.

Sinuses/Orbits: Negative.
IMPRESSION: 1. No significant change in size of the large dural-based
extra-axial lesion centered on the right sphenoid wing, extending
into the anterior and posterior cranial fossae and involving the
right cavernous sinus and sella.
2. No change in the significant mass effect on the adjacent brain
parenchyma with associated parenchymal edema and indistinct cortical
ribbon along the inferior temporal lobe.
3. Leftward midline shift measures approximately 4 mm.
4. Multiple foci of encephalomalacia and gliosis within the right
frontoparietal, posterior temporal and occipital regions related to
prior infarcts. No acute infarct.

## 2020-02-08 MED ORDER — GADOBUTROL 1 MMOL/ML IV SOLN
10.0000 mL | Freq: Once | INTRAVENOUS | Status: AC | PRN
  Administered 2020-02-08: 13:00:00 10 mL via INTRAVENOUS

## 2020-02-29 ENCOUNTER — Inpatient Hospital Stay: Payer: No Typology Code available for payment source | Admitting: Physical Medicine & Rehabilitation

## 2020-03-03 ENCOUNTER — Ambulatory Visit: Payer: Medicare Other | Admitting: Neurology

## 2020-05-01 ENCOUNTER — Ambulatory Visit (INDEPENDENT_AMBULATORY_CARE_PROVIDER_SITE_OTHER): Payer: No Typology Code available for payment source | Admitting: Neurology

## 2020-05-01 ENCOUNTER — Encounter: Payer: Self-pay | Admitting: Neurology

## 2020-05-01 VITALS — BP 116/77 | HR 72 | Ht 71.0 in | Wt 238.0 lb

## 2020-05-01 DIAGNOSIS — I6521 Occlusion and stenosis of right carotid artery: Secondary | ICD-10-CM | POA: Diagnosis not present

## 2020-05-01 DIAGNOSIS — D329 Benign neoplasm of meninges, unspecified: Secondary | ICD-10-CM | POA: Diagnosis not present

## 2020-05-01 NOTE — Progress Notes (Signed)
[facility] [address] [place]. [place] [address] [phone]       OFFICE FOLLOW-UP NOTE  Mr. DAMYON MULLANE Date of Birth:  24-Apr-1945 Medical Record Number:  [id number]   HPI: Mr. Valbuena is a 75 year old African-American male seen today for initial office follow-up visit following hospital consultation for stroke in September 2021.  He is accompanied by his wife and sister and history is obtained from them and review of electronic medical records and I personally reviewed pertinent imaging films in PACS.  He has past medical history of hypertension, obesity, CHF, COPD, MI who presented to [facility] on 11/14/2019 sudden onset of left-sided weakness and facial droop.  MRI scan of the brain showed acute right MCA stroke and is transferred to [facility] for possible neurosurgical intervention since he had a known diagnosis of large right middle cranial fossa meningioma for which she was following with neurosurgery there.  He had a previous CT scan on 05/24/2019 which had shown a peripherally calcified meningioma arising from the greater wing of the sphenoid on the right side measuring 5.5 x 5.5 x 4 cm with mass-effect upon the base of the brain with vasogenic edema in the right temporal lobe.  MRI scan at [facility] 11/14/2019 showed multifocal acute infarcts involving the right cerebrum predominantly right MCA territory with superior elevation of the right MCA secondary to mass-effect from meningioma.  There was mild to moderate narrowing of the right cavernous and supraclinoid ICA segments.  Transthoracic echo showed ejection fraction of 60 to 65%.  There was concern dry raised about possible mitral valve mass but cardiology refused TEE.  LDL cholesterol was 1 1 4  mg percent hemoglobin A1c was 5.4.  CT angiogram of the neck showed bulky eccentric plaque at the right bifurcation with possibly 65 to 7570% stenosis.  Carotid ultrasound showed extensive  plaque at the right carotid bifurcation with unmeasurable stenosis subsequently Dr. Estanislado Pandy did diagnostic cerebral catheter angiogram on 11/19/2019 which confirmed high-grade 75% stenosis to small plaque of the right internal carotid artery and 60% stenosis of right vertebral artery and 60% left vertebral artery origin stenosis.  Patient underwent right carotid angioplasty stenting by Dr. Estanislado Pandy on 11/21/2019 the procedure was uneventful.  He was started on aspirin and Brilinta after that and Lipitor for elevated lipids.  Patient was transferred to rehab.  Patient is currently at home.  Is getting home physical and occupational therapy.  Is able to walk with a walker but needs a 1 person assist.  His left leg strength is improving and he can lift it off the bed.  His left arm is still weak.  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  Patient is tolerating aspirin Brilinta well without bruising or bleeding.  He has had a few minor falls without injury but states states he is quite careful with his walking.  He is tolerating Lipitor well without side effects.  Blood pressures well controlled and today it is 116/77.  He has not yet had any follow-up appointment with Dr. Estanislado Pandy.  He did see Dr. Ellene Route for follow-up in had a repeat MRI scan of the brain done 02/08/2020 which showed stable appearance of the large right sphenoid wing meningioma with stable mass-effect and cytotoxic edema in the temporal lobe with leftward midline shift of 4 mm.  ROS:   14 system review of  systems is positive for weakness, gait difficulty, right eye blindness, poor balance and all other systems negative PMH:  Past Medical History:  Diagnosis Date  . CHF (congestive heart failure) ([place])   . COPD (chronic obstructive pulmonary disease) ([place])   . HTN (hypertension)   . Myocardial infarct ([place])   . OSA (obstructive sleep  apnea)    on CPAP    Social History:  Social History   Socioeconomic History  . Marital status: Married    Spouse name: Grayland Jack  . Number of children: Not on file  . Years of education: Not on file  . Highest education level: Not on file  Occupational History  . Not on file  Tobacco Use  . Smoking status: Current Every Day Smoker    Packs/day: 0.50    Types: Cigarettes  . Smokeless tobacco: Never Used  Substance and Sexual Activity  . Alcohol use: Yes    Alcohol/week: 1.0 standard drink    Types: 1 Shots of liquor per week    Comment: maybe every 6 mths   . Drug use: Never  . Sexual activity: Not Currently  Other Topics Concern  . Not on file  Social History Narrative   Lives with wife   Right Handed   Drinks 3-5 cups caffeine daily   Social Determinants of Health   Financial Resource Strain: Not on file  Food Insecurity: Not on file  Transportation Needs: Not on file  Physical Activity: Not on file  Stress: Not on file  Social Connections: Not on file  Intimate Partner Violence: Not on file    Medications:   Current Outpatient Medications on File Prior to Visit  Medication Sig Dispense Refill  . acetaminophen (TYLENOL) 325 MG tablet Take 2 tablets (650 mg total) by mouth every 4 (four) hours as needed for mild pain (or temp > 37.5 C (99.5 F)).    Marland Kitchen albuterol (VENTOLIN HFA) 108 (90 Base) MCG/ACT inhaler Inhale 2 puffs into the lungs every 6 (six) hours as needed for wheezing or shortness of breath.    Marland Kitchen amLODipine (NORVASC) 10 MG tablet Take 10 mg by mouth daily.     Marland Kitchen artificial tears (LACRILUBE) OINT ophthalmic ointment Place into the right eye daily at 10 pm.    . aspirin EC 81 MG tablet Take 81 mg by mouth daily.    Marland Kitchen atorvastatin (LIPITOR) 80 MG tablet Take 1 tablet (80 mg total) by mouth daily at 6 PM.    . dexamethasone (DECADRON) 0.5 MG tablet Take 1 tablet (0.5 mg total) by mouth every 12 (twelve) hours.    . docusate sodium (COLACE) 100 MG capsule  Take 100 mg by mouth daily as needed for mild constipation.    . furosemide (LASIX) 20 MG tablet Take 1 tablet (20 mg total) by mouth 2 (two) times daily. 30 tablet   . isosorbide mononitrate (IMDUR) 120 MG 24 hr tablet Take 120 mg by mouth daily.    . metolazone (ZAROXOLYN) 2.5 MG tablet Take 2.5 mg by mouth once a week. On Mondays 30 minutes before furosemide. May take twice daily as needed for edema    . Multiple Vitamin (MULTIVITAMIN) tablet Take 1 tablet by mouth daily.    . nitroGLYCERIN (NITROSTAT) 0.4 MG SL tablet Place 0.4 mg under the tongue every 5 (five) minutes x 3 doses as needed for chest pain (If no relief after 3 rd dose report to the ED).    . pantoprazole (PROTONIX) 40 MG tablet  Take 1 tablet (40 mg total) by mouth daily.    . polyethylene glycol (MIRALAX / GLYCOLAX) 17 g packet Take 17 g by mouth daily. 14 each 0  . potassium chloride (KLOR-CON) 10 MEQ tablet Take 1 tablet (10 mEq total) by mouth 2 (two) times daily.    Marland Kitchen sulfacetamide (BLEPH-10) 10 % ophthalmic solution Place 1 drop into the right eye 2 (two) times daily. 15 mL 0  . ticagrelor (BRILINTA) 90 MG TABS tablet Take 1 tablet (90 mg total) by mouth 2 (two) times daily. 60 tablet    No current facility-administered medications on file prior to visit.    Allergies:   Allergies  Allergen Reactions  . Shrimp [Shellfish Allergy]     Physical Exam General: Mildly obese elderly African-American male, seated, in no evident distress Head: head normocephalic and atraumatic.  Neck: supple with no carotid or supraclavicular bruits Cardiovascular: regular rate and rhythm, no murmurs Musculoskeletal: no deformity Skin:  no rash/petichiae Vascular:  Normal pulses all extremities Vitals:   05/01/20 1139  BP: 116/77  Pulse: 72   Neurologic Exam Mental Status: Awake and fully alert. Oriented to place and time. Recent and remote memory intact. Attention span, concentration and fund of knowledge appropriate. Mood and  affect appropriate.  Cranial Nerves: Fundoscopic exam not done.  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  Left eye has slight proptosis..  No light perception on the right side.  Normal vision on the left.. Extraocular movements full without nystagmus. Visual fields full to confrontation. Hearing intact. Facial sensation intact.  Mild left lower facial weakness., tongue, palate moves normally and symmetrically.  Motor: Spastic left hemiplegia with left upper extremity 2/5 strength proximally and significant weakness of grip and intrinsic hand muscles.  Left lower extremity 3/5 proximally and 2/5 distally with left foot drop.  Increased tone on the left with spasticity.  Normal strength on the right. Sensory.: intact to touch ,pinprick .position and vibratory sensation.  Coordination: Rapid alternating movements normal in all extremities. Finger-to-nose and heel-to-shin performed accurately bilaterally. Gait and Station: Deferred as patient is in wheelchair and did not bring his walker and is 1 person assist even with it. Reflexes: 2+ and asymmetric and brisker on the left. Toes downgoing.   NIHSS  6 Modified Rankin 4   ASSESSMENT: 75 year old African-American male with scattered right MCA and ACA infarcts in the setting of right carotid bulb and siphon stenosis and MCA narrowing due to symptomatic proximal right carotid artery stenosis as well as large right meningioma with narrowing of the cavernous carotid and right MCA.  Patient was treated with angioplasty stenting of the right proximal internal carotid artery by Dr. Estanislado Pandy on 11/21/2019.  He has residual left hemiplegia.  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 2021 He has a large right sphenoid wing meningioma with significant cytotoxic edema and mass-effect which is being followed conservatively by neurosurgery    PLAN: I had a long d/w patient, his wife and sister about his  recent stroke, right carotid restenosis and angiioplasty stenting, meningioma, risk for recurrent stroke/TIAs, personally independently reviewed imaging studies and stroke evaluation results and answered questions.Continue aspirin and Brilinta for now but consider stopping one of them as it has been nearly 6 months following his angioplasty stenting for secondary stroke prevention and maintain strict control of hypertension with blood pressure goal below 130/90, diabetes with hemoglobin A1c goal below 6.5% and lipids with LDL cholesterol  goal below 70 mg/dL. I also advised the patient to eat a healthy diet with plenty of whole grains, cereals, fruits and vegetables, exercise regularly and maintain ideal body weight .check follow-up screening carotid ultrasound study.  He was advised to continue ongoing home physical Occupational Therapy and use a walker at all times and follow fall prevention precautions.  Follow-up with Dr. Ellene Route for meningioma surveillance and with Dr. Estanislado Pandy for carotid stent surveillance.  Followup in the future with my nurse practitioner Janett Billow in 6 months or call earlier if necessary.   Greater than 50% of time during this 25 minute visit was spent on counseling,explanation of diagnosis of stroke, carotid stenosis and meningioma, planning of further management, discussion with patient and family and coordination of care Antony Contras, MD  [facility] [address] [place], [place] [address]  Phone [phone] Fax [phone] Note: This document was prepared with digital dictation and possible smart phrase technology. Any transcriptional errors that result from this process are unintentional

## 2020-05-01 NOTE — Patient Instructions (Signed)
I had a long d/w patient, his wife and sister about his recent stroke, right carotid restenosis and angiioplasty stenting, meningioma, risk for recurrent stroke/TIAs, personally independently reviewed imaging studies and stroke evaluation results and answered questions.Continue aspirin and Brilinta for now but consider stopping one of them as it has been nearly 6 months following his angioplasty stenting for secondary stroke prevention and maintain strict control of hypertension with blood pressure goal below 130/90, diabetes with hemoglobin A1c goal below 6.5% and lipids with LDL cholesterol goal below 70 mg/dL. I also advised the patient to eat a healthy diet with plenty of whole grains, cereals, fruits and vegetables, exercise regularly and maintain ideal body weight .check follow-up screening carotid ultrasound study.  He was advised to continue ongoing home physical Occupational Therapy and use a walker at all times and follow fall prevention precautions.  Follow-up with Dr. Ellene Route for meningioma surveillance and with Dr. Estanislado Pandy for carotid stent surveillance.  Followup in the future with my nurse practitioner Janett Billow in 6 months or call earlier if necessary.  Fall Prevention in the Home, Adult Falls can cause injuries and can happen to people of all ages. There are many things you can do to make your home safe and to help prevent falls. Ask for help when making these changes. What actions can I take to prevent falls? General Instructions  Use good lighting in all rooms. Replace any light bulbs that burn out.  Turn on the lights in dark areas. Use night-lights.  Keep items that you use often in easy-to-reach places. Lower the shelves around your home if needed.  Set up your furniture so you have a clear path. Avoid moving your furniture around.  Do not have throw rugs or other things on the floor that can make you trip.  Avoid walking on wet floors.  If any of your floors are uneven, fix  them.  Add color or contrast paint or tape to clearly mark and help you see: ? Grab bars or handrails. ? First and last steps of staircases. ? Where the edge of each step is.  If you use a stepladder: ? Make sure that it is fully opened. Do not climb a closed stepladder. ? Make sure the sides of the stepladder are locked in place. ? Ask someone to hold the stepladder while you use it.  Know where your pets are when moving through your home. What can I do in the bathroom?  Keep the floor dry. Clean up any water on the floor right away.  Remove soap buildup in the tub or shower.  Use nonskid mats or decals on the floor of the tub or shower.  Attach bath mats securely with double-sided, nonslip rug tape.  If you need to sit down in the shower, use a plastic, nonslip stool.  Install grab bars by the toilet and in the tub and shower. Do not use towel bars as grab bars.      What can I do in the bedroom?  Make sure that you have a light by your bed that is easy to reach.  Do not use any sheets or blankets for your bed that hang to the floor.  Have a firm chair with side arms that you can use for support when you get dressed. What can I do in the kitchen?  Clean up any spills right away.  If you need to reach something above you, use a step stool with a grab bar.  Keep electrical  cords out of the way.  Do not use floor polish or wax that makes floors slippery. What can I do with my stairs?  Do not leave any items on the stairs.  Make sure that you have a light switch at the top and the bottom of the stairs.  Make sure that there are handrails on both sides of the stairs. Fix handrails that are broken or loose.  Install nonslip stair treads on all your stairs.  Avoid having throw rugs at the top or bottom of the stairs.  Choose a carpet that does not hide the edge of the steps on the stairs.  Check carpeting to make sure that it is firmly attached to the stairs. Fix  carpet that is loose or worn. What can I do on the outside of my home?  Use bright outdoor lighting.  Fix the edges of walkways and driveways and fix any cracks.  Remove anything that might make you trip as you walk through a door, such as a raised step or threshold.  Trim any bushes or trees on paths to your home.  Check to see if handrails are loose or broken and that both sides of all steps have handrails.  Install guardrails along the edges of any raised decks and porches.  Clear paths of anything that can make you trip, such as tools or rocks.  Have leaves, snow, or ice cleared regularly.  Use sand or salt on paths during winter.  Clean up any spills in your garage right away. This includes grease or oil spills. What other actions can I take?  Wear shoes that: ? Have a low heel. Do not wear high heels. ? Have rubber bottoms. ? Feel good on your feet and fit well. ? Are closed at the toe. Do not wear open-toe sandals.  Use tools that help you move around if needed. These include: ? Canes. ? Walkers. ? Scooters. ? Crutches.  Review your medicines with your doctor. Some medicines can make you feel dizzy. This can increase your chance of falling. Ask your doctor what else you can do to help prevent falls. Where to find more information  Centers for Disease Control and Prevention, STEADI: http://www.wolf.info/  National Institute on Aging: http://kim-miller.com/ Contact a doctor if:  You are afraid of falling at home.  You feel weak, drowsy, or dizzy at home.  You fall at home. Summary  There are many simple things that you can do to make your home safe and to help prevent falls.  Ways to make your home safe include removing things that can make you trip and installing grab bars in the bathroom.  Ask for help when making these changes in your home. This information is not intended to replace advice given to you by your health care provider. Make sure you discuss any questions  you have with your health care provider. Document Revised: 09/12/2019 Document Reviewed: 09/12/2019 Elsevier Patient Education  [place].

## 2020-05-13 ENCOUNTER — Telehealth: Payer: Self-pay | Admitting: Neurology

## 2020-05-13 NOTE — Telephone Encounter (Signed)
Dr. Leonie Man,  Please Advise me . Patient has [employer] and I called and spoke to them at [phone] ext [phone] and they are not going to cover doppler. Patient is going to have contact them back . Per [place] .  What should I do Know ?

## 2020-05-13 NOTE — Telephone Encounter (Signed)
Okay to not to schedule Doppler but have patient call the [place] and see what they advise

## 2020-05-14 NOTE — Telephone Encounter (Signed)
Okay; thanks.

## 2020-05-14 NOTE — Telephone Encounter (Signed)
Called and spoke to patient's wife and relayed that she needed to call [place] and gave her telephone number  Ext .  Below . Relayed to her to apply with [employer] as well would helpful.

## 2020-06-16 ENCOUNTER — Other Ambulatory Visit: Payer: Self-pay

## 2020-06-16 ENCOUNTER — Encounter (HOSPITAL_COMMUNITY): Payer: Self-pay

## 2020-06-16 ENCOUNTER — Emergency Department (HOSPITAL_COMMUNITY): Payer: No Typology Code available for payment source

## 2020-06-16 ENCOUNTER — Emergency Department (HOSPITAL_COMMUNITY)
Admission: EM | Admit: 2020-06-16 | Discharge: 2020-06-16 | Disposition: A | Discharge status: Home / Self Care | Payer: No Typology Code available for payment source | Attending: Emergency Medicine | Admitting: Emergency Medicine

## 2020-06-16 DIAGNOSIS — J449 Chronic obstructive pulmonary disease, unspecified: Secondary | ICD-10-CM | POA: Insufficient documentation

## 2020-06-16 DIAGNOSIS — I5032 Chronic diastolic (congestive) heart failure: Secondary | ICD-10-CM | POA: Diagnosis not present

## 2020-06-16 DIAGNOSIS — R519 Headache, unspecified: Secondary | ICD-10-CM | POA: Insufficient documentation

## 2020-06-16 DIAGNOSIS — Z7982 Long term (current) use of aspirin: Secondary | ICD-10-CM | POA: Diagnosis not present

## 2020-06-16 DIAGNOSIS — Z87891 Personal history of nicotine dependence: Secondary | ICD-10-CM | POA: Insufficient documentation

## 2020-06-16 DIAGNOSIS — I110 Hypertensive heart disease with heart failure: Secondary | ICD-10-CM | POA: Diagnosis not present

## 2020-06-16 DIAGNOSIS — Z79899 Other long term (current) drug therapy: Secondary | ICD-10-CM | POA: Diagnosis not present

## 2020-06-16 HISTORY — DX: Cerebral infarction, unspecified: I63.9

## 2020-06-16 LAB — BASIC METABOLIC PANEL
Anion gap: 8 (ref 5–15)
BUN: 17 mg/dL (ref 8–23)
CO2: 27 mmol/L (ref 22–32)
Calcium: 9.3 mg/dL (ref 8.9–10.3)
Chloride: 102 mmol/L (ref 98–111)
Creatinine, Ser: 1.35 mg/dL — ABNORMAL HIGH (ref 0.61–1.24)
GFR, Estimated: 54 mL/min — ABNORMAL LOW (ref >60)
Glucose, Bld: 96 mg/dL (ref 70–99)
Potassium: 3.7 mmol/L (ref 3.5–5.1)
Sodium: 137 mmol/L (ref 135–145)

## 2020-06-16 LAB — CBC WITH DIFFERENTIAL/PLATELET
Abs Immature Granulocytes: 0.02 10*3/uL (ref 0.00–0.07)
Basophils Absolute: 0 10*3/uL (ref 0.0–0.1)
Basophils Relative: 0 %
Eosinophils Absolute: 0.1 10*3/uL (ref 0.0–0.5)
Eosinophils Relative: 2 %
HCT: 40.2 % (ref 39.0–52.0)
Hemoglobin: 13.4 g/dL (ref 13.0–17.0)
Immature Granulocytes: 0 %
Lymphocytes Relative: 16 %
Lymphs Abs: 1.2 10*3/uL (ref 0.7–4.0)
MCH: 31.6 pg (ref 26.0–34.0)
MCHC: 33.3 g/dL (ref 30.0–36.0)
MCV: 94.8 fL (ref 80.0–100.0)
Monocytes Absolute: 0.6 10*3/uL (ref 0.1–1.0)
Monocytes Relative: 8 %
Neutro Abs: 5.5 10*3/uL (ref 1.7–7.7)
Neutrophils Relative %: 74 %
Platelets: 200 10*3/uL (ref 150–400)
RBC: 4.24 MIL/uL (ref 4.22–5.81)
RDW: 14.6 % (ref 11.5–15.5)
WBC: 7.5 10*3/uL (ref 4.0–10.5)
nRBC: 0 % (ref 0.0–0.2)

## 2020-06-16 IMAGING — CT CT ANGIO NECK
3 of 11 series · 8 of 36 positions shown · IV contrast (Omnipaque or Isovue)
Comparison: CTA [DATE], MRI brain [DATE]

CLINICAL DATA: Sudden onset right-sided headache

EXAM:
CT ANGIOGRAPHY HEAD AND NECK
TECHNIQUE: Noncontrast CT of the head obtained using standard protocol.
Multidetector CT imaging of the head and neck was performed using
the standard protocol during bolus administration of intravenous
contrast. Multiplanar CT image reconstructions and MIPs were
obtained to evaluate the vascular anatomy. Carotid stenosis
measurements (when applicable) are obtained utilizing NASCET
criteria, using the distal internal carotid diameter as the
denominator.
CONTRAST:  75mL OMNIPAQUE IOHEXOL 350 MG/ML SOLN

[Series 8: sagittal soft · sagittal · 0.33mm/px · 1 of 58 slices shown]
[im 31/58  soft-tissue]
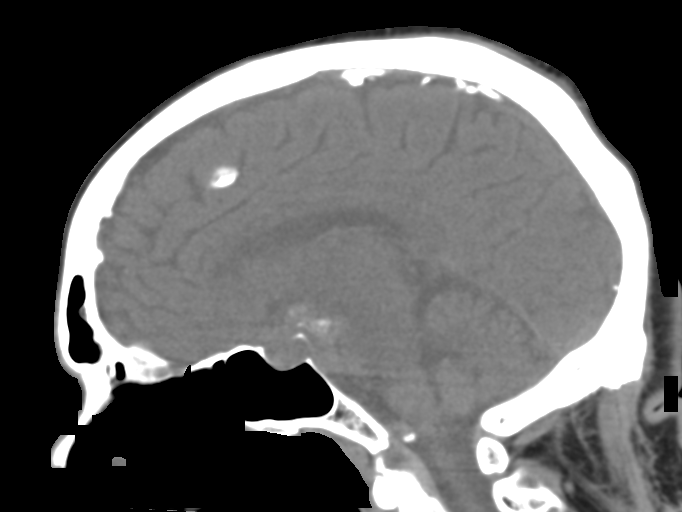

[Series 9: cta head & neck · axial · 0.50mm/px · z∈[+1297,+1403]mm · 2 of 160 slices shown]
[im 54/160  soft-tissue]
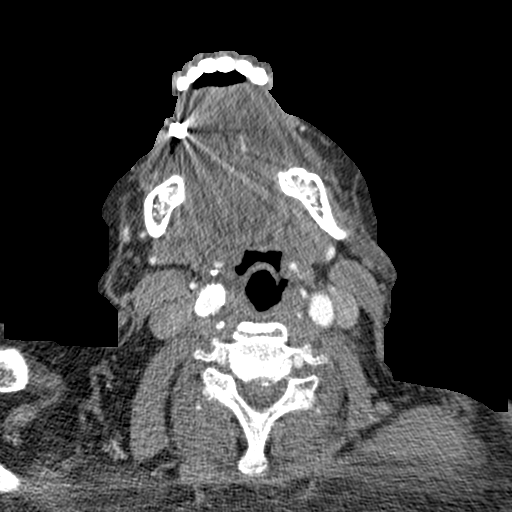
[im 107/160  soft-tissue]
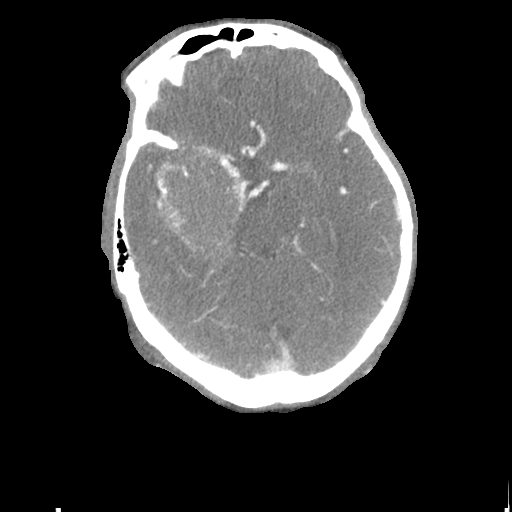

[Series 11: ax thins · axial · 0.40mm/px · z∈[+1248,+1464]mm · 5 of 325 slices shown]
[im 55/325  soft-tissue]
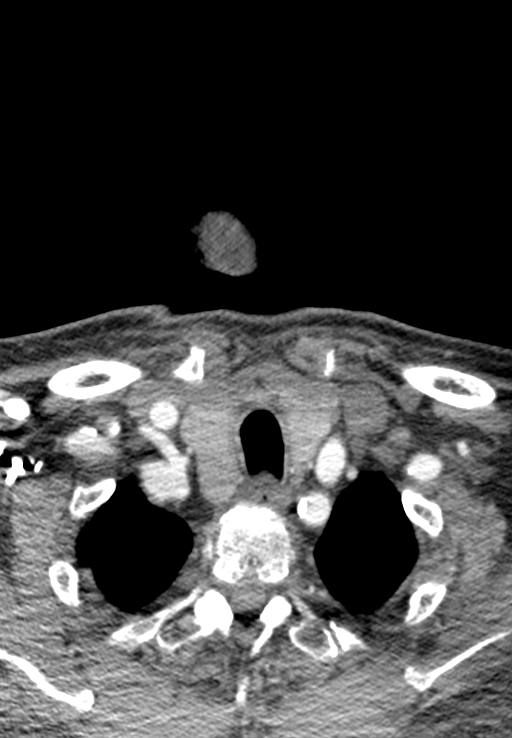
[im 109/325  bone]
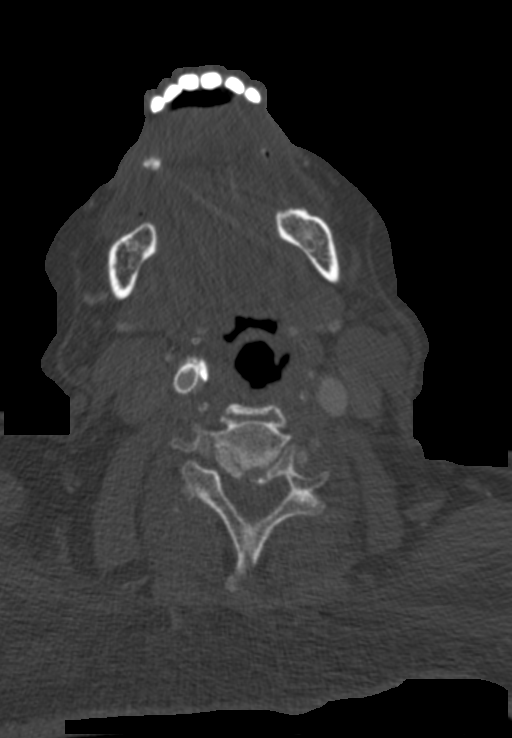
[im 163/325  soft-tissue]
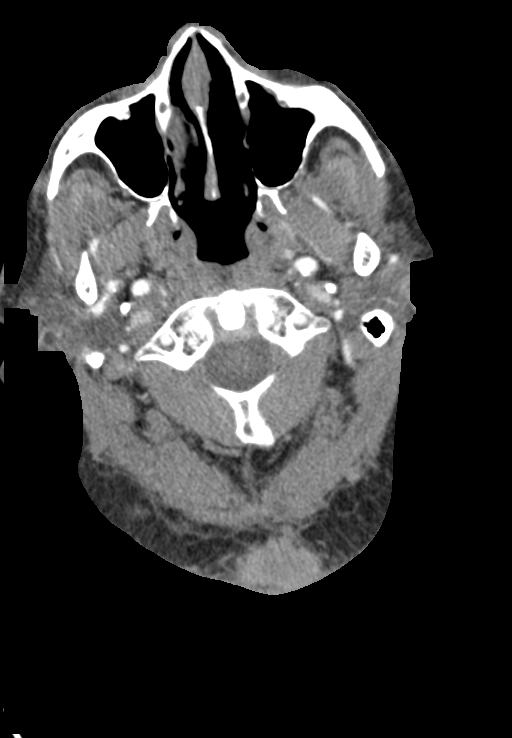
[im 217/325  bone]
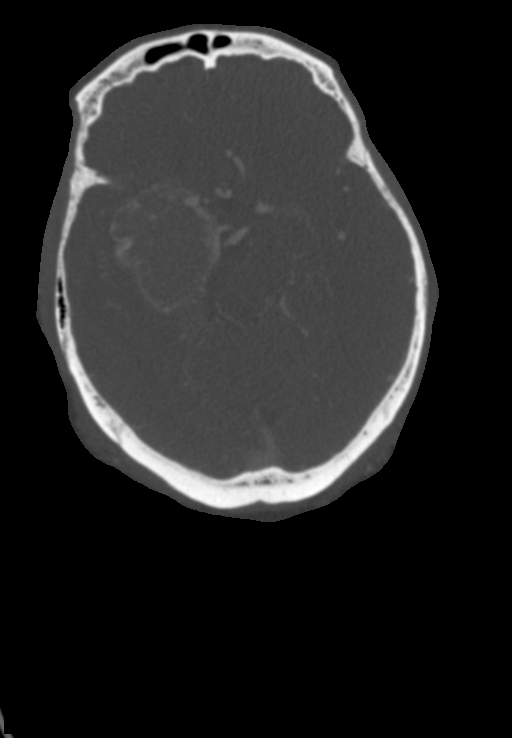
[im 271/325  soft-tissue]
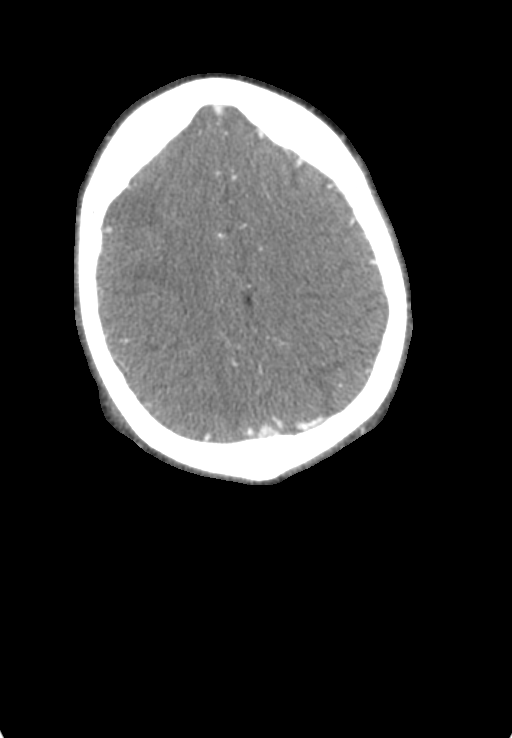

[8 of 36 positions shown; findings below may reference images not displayed]

FINDINGS: CT HEAD

Brain: There is no acute intracranial hemorrhage. No new loss of
gray-white differentiation. There are chronic infarcts of the right
frontal lobe, right parietooccipital lobes, and right temporal lobe.
Large partially calcified meningioma of the middle cranial fossa
extending along the sphenoid wing and posteriorly along the
tentorial insertion and petrous apex. Associated mass effect and
parenchymal edema. Overall appearance is similar to prior MRI. No
hydrocephalus.

Vascular: There is atherosclerotic calcification at the skull base.

Skull: Calvarium is unremarkable.

Sinuses/Orbits: No acute finding.

Other: None.

Review of the MIP images confirms the above findings

CTA NECK

Aortic arch: Mild atherosclerotic plaque along the aortic arch and
great vessel origins. Great vessel origins are patent.

Right carotid system: Patent. Interval placement of patent stent(s)
spanning distal common carotid into the proximal internal carotid.
The cervical ICA is partially retropharyngeal with tortuosity.

Left carotid system: Patent. Calcified plaque at the proximal
internal carotid causing less than 50% stenosis. ICA is partially
retropharyngeal.

Vertebral arteries: Patent and codominant. Minimal calcified plaque.
No stenosis.

Skeleton: Cervical spine degenerative changes.

Other neck: Unremarkable.

Upper chest: No apical lung mass.

Review of the MIP images confirms the above findings

CTA HEAD

Anterior circulation: Intracranial internal carotid arteries are
patent with calcified plaque causing mild stenosis. Anterior and
middle cerebral arteries are patent. There is mild displacement
secondary to the meningioma.

Posterior circulation: Intracranial vertebral arteries are patent
with mild calcified plaque. Basilar artery is patent. Posterior
cerebral arteries are patent. There is mild displacement of the
right PCA by the meningioma.

Venous sinuses: Patent as allowed by contrast bolus timing.

Review of the MIP images confirms the above findings
IMPRESSION: No acute intracranial abnormality. Similar appearance of large right
middle cranial fossa meningioma and associated parenchymal edema and
mass effect.

No new large vessel occlusion. Interval placement of patent right
CCA/ICA stent. No new stenosis.

## 2020-06-16 MED ORDER — ACETAMINOPHEN 325 MG PO TABS
650.0000 mg | ORAL_TABLET | Freq: Once | ORAL | Status: AC
  Administered 2020-06-16: 650 mg via ORAL
  Filled 2020-06-16: qty 2

## 2020-06-16 MED ORDER — DEXAMETHASONE SODIUM PHOSPHATE 10 MG/ML IJ SOLN
6.0000 mg | Freq: Once | INTRAMUSCULAR | Status: AC
  Administered 2020-06-16: 6 mg via INTRAVENOUS
  Filled 2020-06-16: qty 1

## 2020-06-16 MED ORDER — METOCLOPRAMIDE HCL 5 MG/ML IJ SOLN
10.0000 mg | Freq: Once | INTRAMUSCULAR | Status: AC
  Administered 2020-06-16: 10 mg via INTRAVENOUS
  Filled 2020-06-16: qty 2

## 2020-06-16 MED ORDER — IOHEXOL 350 MG/ML SOLN
75.0000 mL | Freq: Once | INTRAVENOUS | Status: AC | PRN
  Administered 2020-06-16: 75 mL via INTRAVENOUS

## 2020-06-16 NOTE — Discharge Instructions (Addendum)
Work-up here in the ED was reassuring.  There were no acute changes noted on imaging studies.  Headache Your lab results showed no acute abnormalities.  For future headaches please try the following regimen: Acetaminophen: May take acetaminophen (generic for Tylenol), as needed, for pain. Your daily total maximum amount of acetaminophen from all sources should be limited to 4000mg /day for persons without liver problems, or 2000mg /day for those with liver problems.  Hydration: Have a goal of about a half liter of water every couple hours to stay well hydrated.   Sleep: Please be sure to get plenty of sleep with a goal of 8 hours per night. Having a regular bed time and bedtime routine can help with this.  Screens: Reduce the amount of time you are in front of screens.  Take about a 5-10-minute break every hour or every couple hours to give your eyes rest.  Do not use screens in dark rooms.  Glasses with a blue light filter may also help reduce eye fatigue.  Stress: Take steps to reduce stress as much as possible.   Follow up: Recommend follow-up with your neurologist on this matter.

## 2020-06-16 NOTE — ED Triage Notes (Signed)
Patient from after reports of headache lasting the past two days. States that this is first HA since 1964 and Tylenol is not giving him any relief. Denies blurry vision, N/V, weakness. HX of CVA with left sided deficit.

## 2020-06-16 NOTE — ED Notes (Signed)
Verbal consent obtained for MSE. Patient understands.

## 2020-06-16 NOTE — ED Provider Notes (Signed)
[facility] EMERGENCY DEPARTMENT Provider Note   CSN: [id number] Arrival date & time: 06/16/20  1311     History Chief Complaint  Patient presents with  . Headache    Charles Sherman is a 75 y.o. male.  HPI      Charles Sherman is a 75 y.o. male, with a history of CHF, COPD, HTN, MI, stroke, retinal artery occlusion, carotid artery stenosis, vertebral artery stenosis, presenting to the ED with right-sided headache beginning yesterday noted upon waking.  He endorses throbbing, 7/10 pain to the right forehead and right periorbital region.  The pain is worse today than it was yesterday.  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 75% stenosis of the right internal carotid artery, 60% stenosis of right vertebral artery, 60% stenosis left vertebral artery.  Denies fever/chills, acute neck pain, syncope, dizziness, new vision deficits, new weakness, new numbness, chest pain, shortness of breath, difficulty speaking, falls/trauma, or any other complaints.  Past Medical History:  Diagnosis Date  . CHF (congestive heart failure) ([place])   . COPD (chronic obstructive pulmonary disease) ([place])   . HTN (hypertension)   . Myocardial infarct ([place])   . OSA (obstructive sleep apnea)    on CPAP  . Stroke [facility])     Patient Active Problem List   Diagnosis Date Noted  . Slow transit constipation   . Thrombocytopenia ([place])   . AKI (acute kidney injury) ([place])   . Hemiparesis affecting left side as late effect of stroke ([place])   . Morbid obesity ([place])   . Dysphagia, post-stroke   . Chronic diastolic congestive heart failure ([place])   . Essential hypertension   . 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  .  Cerebral thrombosis with cerebral infarction 11/15/2019  . Meningioma ([place]) 11/14/2019    Past Surgical History:  Procedure Laterality Date  . IR ANGIO INTRA EXTRACRAN SEL COM CAROTID INNOMINATE BILAT MOD SED  11/19/2019  . IR ANGIO VERTEBRAL SEL VERTEBRAL BILAT MOD SED  11/19/2019  . IR CT HEAD LTD  11/21/2019  . IR INTRAVSC STENT CERV CAROTID W/O EMB-PROT MOD SED INC ANGIO  11/21/2019  . IR US GUIDE VASC ACCESS RIGHT  11/19/2019  . PERCUTANEOUS CORONARY STENT INTERVENTION (PCI-S)    . RADIOLOGY WITH ANESTHESIA Right 11/21/2019   Procedure: RIGHT CAROTID STENTING;  Surgeon: Luanne Bras, MD;  Location: [facility];  Service: Radiology;  Laterality: Right;       Family History  Problem Relation Age of Onset  . Alzheimer's disease Mother   . Heart disease Father   . Diabetes Sister   . Stomach cancer Brother     Social History   Tobacco Use  . Smoking status: Former Smoker    Packs/day: 0.50    Types: Cigarettes  . Smokeless tobacco: Never Used  Substance Use Topics  . Alcohol use: Yes    Alcohol/week: 1.0 standard drink    Types: 1 Shots of liquor per week    Comment: maybe every 6 mths   . Drug use: Never    Home Medications Prior to Admission medications   Medication Sig Start Date End Date Taking? Authorizing Provider  acetaminophen (TYLENOL) 325 MG tablet Take 2 tablets (650 mg  total) by mouth every 4 (four) hours as needed for mild pain (or temp > 37.5 C (99.5 F)). 12/19/19  Yes Angiulli, Lavon Paganini, PA-C  albuterol (VENTOLIN HFA) 108 (90 Base) MCG/ACT inhaler Inhale 2 puffs into the lungs every 6 (six) hours as needed for wheezing or shortness of breath.   Yes [provider]  amLODipine (NORVASC) 10 MG tablet Take 10 mg by mouth daily.    Yes [provider]  aspirin EC 81 MG tablet Take 81 mg by mouth daily.   Yes [provider]  atorvastatin (LIPITOR) 80 MG tablet Take 1 tablet (80 mg total) by mouth daily at 6 PM. 12/19/19  Yes Angiulli,  Lavon Paganini, PA-C  furosemide (LASIX) 20 MG tablet Take 1 tablet (20 mg total) by mouth 2 (two) times daily. 12/19/19  Yes Angiulli, Lavon Paganini, PA-C  isosorbide mononitrate (IMDUR) 120 MG 24 hr tablet Take 120 mg by mouth daily.   Yes [provider]  metolazone (ZAROXOLYN) 2.5 MG tablet Take 2.5 mg by mouth once a week. On Mondays 30 minutes before furosemide. May take twice daily as needed for edema   Yes [provider]  Multiple Vitamin (MULTIVITAMIN) tablet Take 1 tablet by mouth daily.   Yes [provider]  nitroGLYCERIN (NITROSTAT) 0.4 MG SL tablet Place 0.4 mg under the tongue every 5 (five) minutes x 3 doses as needed for chest pain (If no relief after 3 rd dose report to the ED).   Yes [provider]  pantoprazole (PROTONIX) 40 MG tablet Take 1 tablet (40 mg total) by mouth daily. 12/20/19  Yes Angiulli, Lavon Paganini, PA-C  potassium chloride (KLOR-CON) 10 MEQ tablet Take 1 tablet (10 mEq total) by mouth 2 (two) times daily. 12/19/19  Yes Angiulli, Lavon Paganini, PA-C  artificial tears (LACRILUBE) OINT ophthalmic ointment Place into the right eye daily at 10 pm. Patient not taking: No sig reported 12/19/19   Angiulli, Lavon Paganini, PA-C  dexamethasone (DECADRON) 0.5 MG tablet Take 1 tablet (0.5 mg total) by mouth every 12 (twelve) hours. Patient not taking: No sig reported 12/19/19   Angiulli, Lavon Paganini, PA-C  docusate sodium (COLACE) 100 MG capsule Take 100 mg by mouth daily as needed for mild constipation.    [provider]  polyethylene glycol (MIRALAX / GLYCOLAX) 17 g packet Take 17 g by mouth daily. Patient not taking: No sig reported 12/20/19   Angiulli, Lavon Paganini, PA-C  sulfacetamide (BLEPH-10) 10 % ophthalmic solution Place 1 drop into the right eye 2 (two) times daily. Patient not taking: No sig reported 12/19/19   Angiulli, Lavon Paganini, PA-C  ticagrelor (BRILINTA) 90 MG TABS tablet Take 1 tablet (90 mg total) by mouth 2 (two) times daily. Patient not  taking: No sig reported 12/19/19   Angiulli, Lavon Paganini, PA-C    Allergies    Shrimp [shellfish allergy]  Review of Systems   Review of Systems  Constitutional: Negative for chills, diaphoresis and fever.  Respiratory: Negative for shortness of breath.   Cardiovascular: Negative for chest pain.  Gastrointestinal: Negative for abdominal pain, diarrhea, nausea and vomiting.  Musculoskeletal: Negative for neck pain and neck stiffness.  Neurological: Positive for headaches. Negative for dizziness, seizures, syncope, facial asymmetry, weakness and numbness.  Psychiatric/Behavioral: Negative for confusion.  All other systems reviewed and are negative.   Physical Exam Updated Vital Signs BP (!) 144/74 (BP Location: Left Arm)   Pulse (!) 58   Temp 98.6 F (37 C) (Oral)  Resp 18   Ht 5\' 11"  (1.803 m)   Wt 108 kg   SpO2 92%   BMI 33.19 kg/m   Physical Exam Vitals and nursing note reviewed.  Constitutional:      General: He is not in acute distress.    Appearance: He is well-developed. He is not diaphoretic.  HENT:     Head: Normocephalic and atraumatic.     Mouth/Throat:     Mouth: Mucous membranes are moist.     Pharynx: Oropharynx is clear.  Eyes:     Conjunctiva/sclera: Conjunctivae normal.     Comments: Patient seems to have full EOMs intact.  Denies pain with EOMs. No noted abnormalities to the eye itself. There does appear to be some swelling around the eye and to the right upper and lower eyelid.  No noted erythema.  No tenderness, deformity, instability noted to the face or scalp.  Cardiovascular:     Rate and Rhythm: Normal rate and regular rhythm.     Pulses: Normal pulses.          Radial pulses are 2+ on the right side and 2+ on the left side.       Posterior tibial pulses are 2+ on the right side and 2+ on the left side.     Heart sounds: Normal heart sounds.     Comments: Tactile temperature in the extremities appropriate and equal bilaterally. Pulmonary:      Effort: Pulmonary effort is normal. No respiratory distress.     Breath sounds: Normal breath sounds.  Abdominal:     Palpations: Abdomen is soft.     Tenderness: There is no abdominal tenderness. There is no guarding.  Musculoskeletal:     Cervical back: Neck supple.     Right lower leg: No edema.     Left lower leg: No edema.  Lymphadenopathy:     Cervical: No cervical adenopathy.  Skin:    General: Skin is warm and dry.  Neurological:     Mental Status: He is alert.     Comments: Patient has residual decreased sensation to light touch in the left upper and lower extremities. Grip strength and strength in the upper and lower left extremity weaker than on the right, consistent with patient's history of residual stroke deficits. No noted facial droop, however, patient really would not smile.  No tongue deviation.  No phonation abnormalities noted. Handles oral secretions without noted difficulty.   Psychiatric:        Mood and Affect: Mood and affect normal.        Speech: Speech normal.        Behavior: Behavior normal.     ED Results / Procedures / Treatments   Labs (all labs ordered are listed, but only abnormal results are displayed) Labs Reviewed  BASIC METABOLIC PANEL - Abnormal; Notable for the following components:      Result Value   Creatinine, Ser 1.35 (*)    GFR, Estimated 54 (*)    All other components within normal limits  CBC WITH DIFFERENTIAL/PLATELET    EKG None  Radiology CT Angio Head W or Wo Contrast  Result Date: 06/16/2020 CLINICAL DATA:  Sudden onset right-sided headache EXAM: CT ANGIOGRAPHY HEAD AND NECK TECHNIQUE: Noncontrast CT of the head obtained using standard protocol. Multidetector CT imaging of the head and neck was performed using the standard protocol during bolus administration of intravenous contrast. Multiplanar CT image reconstructions and MIPs were obtained to evaluate the vascular anatomy.  Carotid stenosis measurements (when  applicable) are obtained utilizing NASCET criteria, using the distal internal carotid diameter as the denominator. CONTRAST:  34mL OMNIPAQUE IOHEXOL 350 MG/ML SOLN COMPARISON:  CTA September 2021, MRI brain February 08, 2020 FINDINGS: CT HEAD Brain: There is no acute intracranial hemorrhage. No new loss of gray-white differentiation. There are chronic infarcts of the right frontal lobe, right parietooccipital lobes, and right temporal lobe. Large partially calcified meningioma of the middle cranial fossa extending along the sphenoid wing and posteriorly along the tentorial insertion and petrous apex. Associated mass effect and parenchymal edema. Overall appearance is similar to prior MRI. No hydrocephalus. Vascular: There is atherosclerotic calcification at the skull base. Skull: Calvarium is unremarkable. Sinuses/Orbits: No acute finding. Other: None. Review of the MIP images confirms the above findings CTA NECK Aortic arch: Mild atherosclerotic plaque along the aortic arch and great vessel origins. Great vessel origins are patent. Right carotid system: Patent. Interval placement of patent stent(s) spanning distal common carotid into the proximal internal carotid. The cervical ICA is partially retropharyngeal with tortuosity. Left carotid system: Patent. Calcified plaque at the proximal internal carotid causing less than 50% stenosis. ICA is partially retropharyngeal. Vertebral arteries: Patent and codominant. Minimal calcified plaque. No stenosis. Skeleton: Cervical spine degenerative changes. Other neck: Unremarkable. Upper chest: No apical lung mass. Review of the MIP images confirms the above findings CTA HEAD Anterior circulation: Intracranial internal carotid arteries are patent with calcified plaque causing mild stenosis. Anterior and middle cerebral arteries are patent. There is mild displacement secondary to the meningioma. Posterior circulation: Intracranial vertebral arteries are patent with mild  calcified plaque. Basilar artery is patent. Posterior cerebral arteries are patent. There is mild displacement of the right PCA by the meningioma. Venous sinuses: Patent as allowed by contrast bolus timing. Review of the MIP images confirms the above findings IMPRESSION: No acute intracranial abnormality. Similar appearance of large right middle cranial fossa meningioma and associated parenchymal edema and mass effect. No new large vessel occlusion. Interval placement of patent right CCA/ICA stent. No new stenosis. Electronically Signed   By: Macy Mis M.D.   On: 06/16/2020 16:32   CT Angio Neck W and/or Wo Contrast  Result Date: 06/16/2020 CLINICAL DATA:  Sudden onset right-sided headache EXAM: CT ANGIOGRAPHY HEAD AND NECK TECHNIQUE: Noncontrast CT of the head obtained using standard protocol. Multidetector CT imaging of the head and neck was performed using the standard protocol during bolus administration of intravenous contrast. Multiplanar CT image reconstructions and MIPs were obtained to evaluate the vascular anatomy. Carotid stenosis measurements (when applicable) are obtained utilizing NASCET criteria, using the distal internal carotid diameter as the denominator. CONTRAST:  20mL OMNIPAQUE IOHEXOL 350 MG/ML SOLN COMPARISON:  CTA September 2021, MRI brain February 08, 2020 FINDINGS: CT HEAD Brain: There is no acute intracranial hemorrhage. No new loss of gray-white differentiation. There are chronic infarcts of the right frontal lobe, right parietooccipital lobes, and right temporal lobe. Large partially calcified meningioma of the middle cranial fossa extending along the sphenoid wing and posteriorly along the tentorial insertion and petrous apex. Associated mass effect and parenchymal edema. Overall appearance is similar to prior MRI. No hydrocephalus. Vascular: There is atherosclerotic calcification at the skull base. Skull: Calvarium is unremarkable. Sinuses/Orbits: No acute finding. Other:  None. Review of the MIP images confirms the above findings CTA NECK Aortic arch: Mild atherosclerotic plaque along the aortic arch and great vessel origins. Great vessel origins are patent. Right carotid system: Patent. Interval placement of patent stent(s)  spanning distal common carotid into the proximal internal carotid. The cervical ICA is partially retropharyngeal with tortuosity. Left carotid system: Patent. Calcified plaque at the proximal internal carotid causing less than 50% stenosis. ICA is partially retropharyngeal. Vertebral arteries: Patent and codominant. Minimal calcified plaque. No stenosis. Skeleton: Cervical spine degenerative changes. Other neck: Unremarkable. Upper chest: No apical lung mass. Review of the MIP images confirms the above findings CTA HEAD Anterior circulation: Intracranial internal carotid arteries are patent with calcified plaque causing mild stenosis. Anterior and middle cerebral arteries are patent. There is mild displacement secondary to the meningioma. Posterior circulation: Intracranial vertebral arteries are patent with mild calcified plaque. Basilar artery is patent. Posterior cerebral arteries are patent. There is mild displacement of the right PCA by the meningioma. Venous sinuses: Patent as allowed by contrast bolus timing. Review of the MIP images confirms the above findings IMPRESSION: No acute intracranial abnormality. Similar appearance of large right middle cranial fossa meningioma and associated parenchymal edema and mass effect. No new large vessel occlusion. Interval placement of patent right CCA/ICA stent. No new stenosis. Electronically Signed   By: Macy Mis M.D.   On: 06/16/2020 16:32    Procedures Procedures   Medications Ordered in ED Medications  metoCLOPramide (REGLAN) injection 10 mg (10 mg Intravenous Given 06/16/20 1444)  dexamethasone (DECADRON) injection 6 mg (6 mg Intravenous Given 06/16/20 1444)  acetaminophen (TYLENOL) tablet 650 mg  (650 mg Oral Given 06/16/20 1443)  iohexol (OMNIPAQUE) 350 MG/ML injection 75 mL (75 mLs Intravenous Contrast Given 06/16/20 1605)    ED Course  I have reviewed the triage vital signs and the nursing notes.  Pertinent labs & imaging results that were available during my care of the patient were reviewed by me and considered in my medical decision making (see chart for details).    MDM Rules/Calculators/A&P                          Patient presents with 2 days of right frontal headache.  No noted acute neurologic deficits, though this assessment is more challenging due to the patient's underlying baseline deficits. Patient is nontoxic appearing, afebrile, not tachycardic, not tachypneic, not hypotensive, maintains excellent SPO2 on room air, and is in no apparent distress.   I have reviewed the patient's chart to obtain more information.   I reviewed and interpreted the patient's labs and radiological studies. No acute abnormalities noted on CT scan.  Lab results overall reassuring. Patient voiced improvement in his symptoms with interventions here in the ED. The patient was given instructions for home care as well as return precautions. Patient voices understanding of these instructions, accepts the plan, and is comfortable with discharge.  Findings and plan of care discussed with attending physician, Gara Kroner, MD. Dr. Laverta Baltimore personally evaluated and examined this patient.  Vitals:   06/16/20 1326 06/16/20 1327 06/16/20 1425 06/16/20 1645  BP: (!) 144/74  (!) 148/64 136/81  Pulse: (!) 58  (!) 54 (!) 51  Resp: 18  18 18   Temp: 98.6 F (37 C)     TempSrc: Oral     SpO2: 92%  92% 95%  Weight:  108 kg    Height:  5\' 11"  (1.803 m)       Final Clinical Impression(s) / ED Diagnoses Final diagnoses:  Right-sided headache    Rx / DC Orders ED Discharge Orders    None       Lorayne Bender, PA-C 06/16/20  1733    Margette Fast, MD 06/17/20 [phone]

## 2020-07-24 ENCOUNTER — Telehealth: Payer: Self-pay | Admitting: Cardiology

## 2020-07-24 NOTE — Telephone Encounter (Signed)
New message     *STAT* If patient is at the pharmacy, call can be transferred to refill team.   1. Which medications need to be refilled? (please list name of each medication and dose if known) metoprolol 25mg   2. Which pharmacy/location (including street and city if local pharmacy) is medication to be sent to? [facility]   3. Do they need a 30 day or 90 day supply? 2 weeks - waiting on mail order from [place]

## 2020-07-24 NOTE — Telephone Encounter (Signed)
Please clarify if patient should be on lopressor. Hospital Discharge from 10/21has medication stopped when he went to a SNF. Wife says he is home now and med list given to her included lopressor 25 mg BID. Has apt next week with APP

## 2020-07-24 NOTE — Telephone Encounter (Signed)
I would stay off until follow up to sort things out. Needs to bring SNF discharge info as I dont believe we have access to that    Zandra Abts MD

## 2020-07-24 NOTE — Telephone Encounter (Signed)
I spoke with wife and she agrees to wait until visit on 08/01/20 to determine if Mr.Waites needs to be on lopressor.   I called [facility], [phone] and requested they fax his discharge summary.

## 2020-08-01 ENCOUNTER — Telehealth: Payer: Self-pay

## 2020-08-01 ENCOUNTER — Other Ambulatory Visit: Payer: Self-pay

## 2020-08-01 ENCOUNTER — Encounter: Payer: Self-pay | Admitting: Student

## 2020-08-01 ENCOUNTER — Ambulatory Visit (INDEPENDENT_AMBULATORY_CARE_PROVIDER_SITE_OTHER): Payer: No Typology Code available for payment source | Admitting: Student

## 2020-08-01 ENCOUNTER — Other Ambulatory Visit (HOSPITAL_COMMUNITY)
Admission: RE | Admit: 2020-08-01 | Discharge: 2020-08-01 | Disposition: A | Discharge status: Home / Self Care | Payer: No Typology Code available for payment source | Source: Ambulatory Visit | Attending: Student | Admitting: Student

## 2020-08-01 VITALS — BP 112/58 | HR 56 | Ht 71.0 in | Wt 214.2 lb

## 2020-08-01 DIAGNOSIS — Z79899 Other long term (current) drug therapy: Secondary | ICD-10-CM | POA: Insufficient documentation

## 2020-08-01 DIAGNOSIS — I5032 Chronic diastolic (congestive) heart failure: Secondary | ICD-10-CM

## 2020-08-01 DIAGNOSIS — E785 Hyperlipidemia, unspecified: Secondary | ICD-10-CM

## 2020-08-01 DIAGNOSIS — I10 Essential (primary) hypertension: Secondary | ICD-10-CM

## 2020-08-01 DIAGNOSIS — I6521 Occlusion and stenosis of right carotid artery: Secondary | ICD-10-CM

## 2020-08-01 DIAGNOSIS — I2510 Atherosclerotic heart disease of native coronary artery without angina pectoris: Secondary | ICD-10-CM

## 2020-08-01 LAB — CBC
HCT: 40.3 % (ref 39.0–52.0)
Hemoglobin: 13.7 g/dL (ref 13.0–17.0)
MCH: 32.2 pg (ref 26.0–34.0)
MCHC: 34 g/dL (ref 30.0–36.0)
MCV: 94.6 fL (ref 80.0–100.0)
Platelets: 212 10*3/uL (ref 150–400)
RBC: 4.26 MIL/uL (ref 4.22–5.81)
RDW: 15.8 % — ABNORMAL HIGH (ref 11.5–15.5)
WBC: 6.9 10*3/uL (ref 4.0–10.5)
nRBC: 0 % (ref 0.0–0.2)

## 2020-08-01 LAB — BASIC METABOLIC PANEL
Anion gap: 11 (ref 5–15)
BUN: 36 mg/dL — ABNORMAL HIGH (ref 8–23)
CO2: 29 mmol/L (ref 22–32)
Calcium: 9.6 mg/dL (ref 8.9–10.3)
Chloride: 99 mmol/L (ref 98–111)
Creatinine, Ser: 2.21 mg/dL — ABNORMAL HIGH (ref 0.61–1.24)
GFR, Estimated: 30 mL/min — ABNORMAL LOW (ref >60)
Glucose, Bld: 133 mg/dL — ABNORMAL HIGH (ref 70–99)
Potassium: 3.1 mmol/L — ABNORMAL LOW (ref 3.5–5.1)
Sodium: 139 mmol/L (ref 135–145)

## 2020-08-01 NOTE — Telephone Encounter (Signed)
Left message to return call 

## 2020-08-01 NOTE — Telephone Encounter (Signed)
-----   Message from Erma Heritage, [place] sent at 08/01/2020  4:21 PM EDT ----- Please let the patient and his wife know that his hemoglobin and platelets are within a normal range.  She did report he has been having darker urination and unfortunately his kidney function has worsened as compared to labs earlier this year as his creatinine was previously at 1.35 in 05/2020 and is now at 2.21. I would recommend that he hold Metolazone and reduce Lasix to 20mg  once daily. K+ was low so would take an extra 20 mEq K-dur today and tomorrow then resume normal dosing. Recheck BMET again in 7-10 days.

## 2020-08-01 NOTE — Patient Instructions (Signed)
Medication Instructions:  Your physician recommends that you continue on your current medications as directed. Please refer to the Current Medication list given to you today.  *If you need a refill on your cardiac medications before your next appointment, please call your pharmacy*   Lab Work: CBC BMET  If you have labs (blood work) drawn today and your tests are completely normal, you will receive your results only by: [place] (if you have MyChart) OR A paper copy in the mail If you have any lab test that is abnormal or we need to change your treatment, we will call you to review the results.   Testing/Procedures: None   Follow-Up: At [facility], you and your health needs are our priority.  As part of our continuing mission to provide you with exceptional heart care, we have created designated Provider Care Teams.  These Care Teams include your primary Cardiologist (physician) and Advanced Practice Providers (APPs -  Physician Assistants and Nurse Practitioners) who all work together to provide you with the care you need, when you need it.  We recommend signing up for the patient portal called "MyChart".  Sign up information is provided on this After Visit Summary.  MyChart is used to connect with patients for Virtual Visits (Telemedicine).  Patients are able to view lab/test results, encounter notes, upcoming appointments, etc.  Non-urgent messages can be sent to your provider as well.   To learn more about what you can do with MyChart, go to [email or web address].    Your next appointment:   6 month(s)  The format for your next appointment:   In Person  Provider:   Carlyle Dolly, MD   Other Instructions

## 2020-08-01 NOTE — Progress Notes (Signed)
Cardiology Office Note    Date:  08/02/2020   ID:  Charles Sherman, DOB [birth year], MRN [id number]  PCP:  Jacqlyn Larsen, MD  Cardiologist: Carlyle Dolly, MD    Chief Complaint  Patient presents with   Follow-up    Medication management    History of Present Illness:    Charles Sherman is a 75 y.o. male with past medical history of CAD (s/p prior PCI at the [place] with most recent intervention in 2010, low-risk NST in 10/2016), chronic diastolic CHF, abnormal mitral valve by echo (concern for calcified mass by echo in 10/2019 and TEE initially recommended but images again reviewed and felt to be most consistent with mitral annular calcification), COPD, HTN, HLD, meningioma and prior CVA who presents to the office today for medication management.   He was last examined by Dr. Harl Bowie in 07/2019 and denied any recent anginal symptoms at that time. An event monitor had recently been recommended given his prior retinal artery occlusion and this showed no significant abnormalities. He was informed to follow-up with Cardiology as needed.  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 His transthoracic echocardiogram was read as being abnormal during admission but images were further reviewed by Cardiology and felt to be most consistent with mitral annular calcification as compared to a mass and TEE was not pursued. He was discharged to inpatient rehab on 11/28/2019 and was discharged to SNF on 12/20/2019.  His wife called the office on 07/24/2020 requesting clarification in regards to his medications and a follow-up visit was arranged.  In talking with the patient and his wife today, he reports still feeling weak since returning home and was previously working with PT and they are scheduled to start coming back next week. He does use a walker at home for ambulation and typically sits in  a wheelchair throughout the day. He denies any specific orthopnea, PND or lower extremity edema. No recent exertional chest pain or palpitations. He did restart Lopressor last week and has overall been tolerating this well and denies any side effects.   Past Medical History:  Diagnosis Date   CHF (congestive heart failure) (HCC)    COPD (chronic obstructive pulmonary disease) (HCC)    HTN (hypertension)    Myocardial infarct (HCC)    OSA (obstructive sleep apnea)    on CPAP   Stroke [facility])     Past Surgical History:  Procedure Laterality Date   IR ANGIO INTRA EXTRACRAN SEL COM CAROTID INNOMINATE BILAT MOD SED  11/19/2019   IR ANGIO VERTEBRAL SEL VERTEBRAL BILAT MOD SED  11/19/2019   IR CT HEAD LTD  11/21/2019   IR INTRAVSC STENT CERV CAROTID W/O EMB-PROT MOD SED INC ANGIO  11/21/2019   IR US GUIDE VASC ACCESS RIGHT  11/19/2019   PERCUTANEOUS CORONARY STENT INTERVENTION (PCI-S)     RADIOLOGY WITH ANESTHESIA Right 11/21/2019   Procedure: RIGHT CAROTID STENTING;  Surgeon: Luanne Bras, MD;  Location: [place];  Service: Radiology;  Laterality: Right;    Current Medications: Outpatient Medications Prior to Visit  Medication Sig Dispense Refill   acetaminophen (TYLENOL) 325 MG tablet Take 2 tablets (650 mg total) by mouth every 4 (four) hours as needed for mild pain (or temp > 37.5 C (99.5 F)).     albuterol (VENTOLIN HFA) 108 (90 Base) MCG/ACT inhaler Inhale 2 puffs into the lungs  every 6 (six) hours as needed for wheezing or shortness of breath.     amLODipine (NORVASC) 10 MG tablet Take 10 mg by mouth daily.      aspirin EC 81 MG tablet Take 81 mg by mouth daily.     atorvastatin (LIPITOR) 80 MG tablet Take 1 tablet (80 mg total) by mouth daily at 6 PM.     docusate sodium (COLACE) 100 MG capsule Take 100 mg by mouth daily as needed for mild constipation.     furosemide (LASIX) 20 MG tablet Take 1 tablet (20 mg total) by mouth 2 (two) times daily. 30 tablet    metolazone (ZAROXOLYN) 2.5  MG tablet Take 2.5 mg by mouth once a week. On Mondays 30 minutes before furosemide. May take twice daily as needed for edema     metoprolol tartrate (LOPRESSOR) 25 MG tablet Take 25 mg by mouth 2 (two) times daily.     Multiple Vitamin (MULTIVITAMIN) tablet Take 1 tablet by mouth daily.     nitroGLYCERIN (NITROSTAT) 0.4 MG SL tablet Place 0.4 mg under the tongue every 5 (five) minutes x 3 doses as needed for chest pain (If no relief after 3 rd dose report to the ED).     pantoprazole (PROTONIX) 40 MG tablet Take 1 tablet (40 mg total) by mouth daily.     potassium chloride (KLOR-CON) 10 MEQ tablet Take 1 tablet (10 mEq total) by mouth 2 (two) times daily.     isosorbide mononitrate (IMDUR) 120 MG 24 hr tablet Take 120 mg by mouth daily. (Patient not taking: Reported on 08/01/2020)     ticagrelor (BRILINTA) 90 MG TABS tablet Take 1 tablet (90 mg total) by mouth 2 (two) times daily. (Patient not taking: No sig reported) 60 tablet    artificial tears (LACRILUBE) OINT ophthalmic ointment Place into the right eye daily at 10 pm. (Patient not taking: No sig reported)     dexamethasone (DECADRON) 0.5 MG tablet Take 1 tablet (0.5 mg total) by mouth every 12 (twelve) hours. (Patient not taking: No sig reported)     polyethylene glycol (MIRALAX / GLYCOLAX) 17 g packet Take 17 g by mouth daily. (Patient not taking: No sig reported) 14 each 0   sulfacetamide (BLEPH-10) 10 % ophthalmic solution Place 1 drop into the right eye 2 (two) times daily. (Patient not taking: No sig reported) 15 mL 0   No facility-administered medications prior to visit.     Allergies:   Shrimp [shellfish allergy]   Social History   Socioeconomic History   Marital status: Married    Spouse name: Grayland Jack   Number of children: Not on file   Years of education: Not on file   Highest education level: Not on file  Occupational History   Not on file  Tobacco Use   Smoking status: Former    Packs/day: 0.50    Pack years: 0.00     Types: Cigarettes   Smokeless tobacco: Never  Substance and Sexual Activity   Alcohol use: Yes    Alcohol/week: 1.0 standard drink    Types: 1 Shots of liquor per week    Comment: maybe every 6 mths    Drug use: Never   Sexual activity: Not Currently  Other Topics Concern   Not on file  Social History Narrative   Lives with wife   Right Handed   Drinks 3-5 cups caffeine daily   Social Determinants of Health   Financial Resource Strain: Not on file  Food Insecurity: Not on file  Transportation Needs: Not on file  Physical Activity: Not on file  Stress: Not on file  Social Connections: Not on file     Family History:  The patient's family history includes Alzheimer's disease in his mother; Diabetes in his sister; Heart disease in his father; Stomach cancer in his brother.   Review of Systems:    Please see the history of present illness.     All other systems reviewed and are otherwise negative except as noted above.   Physical Exam:    VS:  BP (!) 112/58   Pulse (!) 56   Ht 5\' 11"  (1.803 m)   Wt 214 lb 3.2 oz (97.2 kg)   SpO2 95%   BMI 29.87 kg/m    General: Pleasant elderly male appearing in no acute distress. Head: Normocephalic, atraumatic. Neck: No carotid bruits. JVD not elevated.  Lungs: Respirations regular and unlabored, without wheezes or rales.  Heart: Regular rate and rhythm. No S3 or S4.  No murmur, no rubs, or gallops appreciated. Abdomen: Appears non-distended. No obvious abdominal masses. Msk: No obvious joint deformities or effusions.  Extremities: No clubbing or cyanosis. No pitting edema.  Distal pedal pulses are 2+ bilaterally. Neuro: Alert and oriented X 3. Uses his right hand to move his left arm.  Psych:  Responds to questions appropriately with a normal affect. Skin: No rashes or lesions noted  Wt Readings from Last 3 Encounters:  08/01/20 214 lb 3.2 oz (97.2 kg)  06/16/20 238 lb (108 kg)  05/01/20 238 lb (108 kg)     Studies/Labs  Reviewed:   EKG:  EKG is not ordered today.   Recent Labs: 11/29/2019: ALT 33 08/01/2020: BUN 36; Creatinine, Ser 2.21; Hemoglobin 13.7; Platelets 212; Potassium 3.1; Sodium 139   Lipid Panel    Component Value Date/Time   CHOL 189 11/15/2019 1143   TRIG 54 11/15/2019 1143   HDL 64 11/15/2019 1143   CHOLHDL 3.0 11/15/2019 1143   VLDL 11 11/15/2019 1143   LDLCALC 114 (H) 11/15/2019 1143    Additional studies/ records that were reviewed today include:   Event Monitor: 03/2019 30 day event monitor. Data only available from 27% of planned monitored time Min HR 42, max HR 99, Avg HR 53 No symptoms reported. Telemetry tracings show sinus rhythm. No significant arrhythmias.   Carotid Dopplers: 10/2019 Right         Non Diagnostic Right Ica evaluation due to extensive  calcified  Carotid:      plaque.   Left Carotid: Velocities in the left ICA are consistent with a 1-39%  stenosis.   Vertebrals: Bilateral vertebral arteries demonstrate antegrade flow.   Echocardiogram: 10/2019 IMPRESSIONS     1. There is a small calcified mass attached to the anterior MV leaflet.  It is moves with the leaflet. It measures 0.4 cm x 0.7 cm. It is best seen  in the PLAX view. This likely represents calcium due to a degenerative  mitral valve disease. There is no  significant regurgitation or destruction of the valve to suggest  endocarditis. 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 Would  recommend a TEE for better characterization in the setting of recent  stroke. The mitral valve is degenerative.  Trivial mitral valve regurgitation. No evidence of mitral stenosis.   2. Left ventricular ejection fraction, by estimation, is 60 to 65%. The  left ventricle has normal function. The left ventricle has no regional  wall motion abnormalities.  There is mild concentric left ventricular  hypertrophy. Left ventricular diastolic  parameters are consistent with Grade I diastolic dysfunction  (impaired  relaxation).   3. Right ventricular systolic function is normal. The right ventricular  size is normal. Tricuspid regurgitation signal is inadequate for assessing  PA pressure.   4. Left atrial size was mildly dilated.   5. The aortic valve is tricuspid. There is mild calcification of the  aortic valve. There is mild thickening of the aortic valve. Aortic valve  regurgitation is not visualized. Mild aortic valve sclerosis is present,  with no evidence of aortic valve  stenosis.   6. The inferior vena cava is normal in size with greater than 50%  respiratory variability, suggesting right atrial pressure of 3 mmHg.   Conclusion(s)/Recommendation(s): Findings concerning for MV mass, would  recommend Transesophageal Echocardiogram for clarification.   CTA Neck: 05/2020 IMPRESSION: No acute intracranial abnormality. Similar appearance of large right middle cranial fossa meningioma and associated parenchymal edema and mass effect.   No new large vessel occlusion. Interval placement of patent right CCA/ICA stent. No new stenosis.  Assessment:    1. Coronary artery disease involving native coronary artery of native heart without angina pectoris   2. Chronic diastolic heart failure ([place])   3. Essential hypertension   4. Medication management   5. Hyperlipidemia LDL goal <70   6. Stenosis of right carotid artery      Plan:   In order of problems listed above:  1. CAD - He is s/p prior PCI at the [place] with most recent intervention in 2010 and most recent ischemic evaluation was a low-risk NST in 10/2016. - His activity has been limited since his CVA last year but he planning to resume working with PT. No reported exertional chest pain or dyspnea on exertion.  - Continue current medication regimen with ASA 81mg  daily, Atorvastatin 80mg  daily, Imdur 120mg  daily and Lopressor 25mg  BID. Currently on Brilinta 90mg  BID per Neurology.   2. HFpEF - He does not appear volume  overloaded by examination today and he reports his weight has continued to decline since his CVA. He is currently on Lasix 20mg  BID and Metolazone 2.5mg  once weekly. His wife does report his urine has been darker for the past 2 weeks and he reports good oral intake. Will recheck a BMET today as he may require adjustment in diuretic therapy given his weight loss.   3. HTN - BP is well-controlled at 112/58 during today's visit. Continue current medication regimen.   4. HLD - Followed by PCP. He remains on Atorvastatin 80mg  daily with goal LDL less than 70 in the setting of known CAD and prior CVA.   5. Carotid Artery Stenosis - He is s/p placement of a carotid stent on 11/21/2019. Followed by Dr. Estanislado Pandy. Will need repeat carotid dopplers later this year.   Medication Adjustments/Labs and Tests Ordered: Current medicines are reviewed at length with the patient today.  Concerns regarding medicines are outlined above.  Medication changes, Labs and Tests ordered today are listed in the Patient Instructions below. Patient Instructions  Medication Instructions:  Your physician recommends that you continue on your current medications as directed. Please refer to the Current Medication list given to you today.  *If you need a refill on your cardiac medications before your next appointment, please call your pharmacy*   Lab Work: CBC BMET  If you have labs (blood work) drawn today and your tests are completely normal, you will receive  your results only by: [place] (if you have MyChart) OR A paper copy in the mail If you have any lab test that is abnormal or we need to change your treatment, we will call you to review the results.   Testing/Procedures: None   Follow-Up: At [facility], you and your health needs are our priority.  As part of our continuing mission to provide you with exceptional heart care, we have created designated Provider Care Teams.  These Care Teams include  your primary Cardiologist (physician) and Advanced Practice Providers (APPs -  Physician Assistants and Nurse Practitioners) who all work together to provide you with the care you need, when you need it.  We recommend signing up for the patient portal called "MyChart".  Sign up information is provided on this After Visit Summary.  MyChart is used to connect with patients for Virtual Visits (Telemedicine).  Patients are able to view lab/test results, encounter notes, upcoming appointments, etc.  Non-urgent messages can be sent to your provider as well.   To learn more about what you can do with MyChart, go to [email or web address].    Your next appointment:   6 month(s)  The format for your next appointment:   In Person  Provider:   Carlyle Dolly, MD   Other Instructions     Signed, Erma Heritage, PA-C  08/02/2020 8:59 AM    [facility]. [address] [place], [place] [address] Phone: [phone] Fax: [phone]

## 2020-08-02 ENCOUNTER — Encounter: Payer: Self-pay | Admitting: Student

## 2020-08-04 MED ORDER — METOLAZONE 2.5 MG PO TABS: 2.5000 mg | ORAL_TABLET | ORAL | Status: DC

## 2020-08-04 MED ORDER — FUROSEMIDE 20 MG PO TABS: 20.0000 mg | ORAL_TABLET | Freq: Every day | ORAL | 3 refills | Status: AC

## 2020-08-04 NOTE — Telephone Encounter (Signed)
New message    Patient wife is returning call to [facility]

## 2020-08-04 NOTE — Telephone Encounter (Signed)
I spoke with wife. Lab results discussed. She will Hold zaroxolyn for now, decrease lasix to 20 mg daily (from BID) and take additional potassium 10 meq today and tomorrow.repeat BMET on 08/11/20

## 2020-08-06 ENCOUNTER — Encounter (HOSPITAL_COMMUNITY): Payer: Self-pay

## 2020-08-06 ENCOUNTER — Inpatient Hospital Stay (HOSPITAL_COMMUNITY)
Admission: EM | Admit: 2020-08-06 | Discharge: 2020-08-09 | DRG: 436 | Disposition: A | Discharge status: Hospice | Payer: No Typology Code available for payment source | Attending: Internal Medicine | Admitting: Internal Medicine

## 2020-08-06 ENCOUNTER — Emergency Department (HOSPITAL_COMMUNITY): Payer: No Typology Code available for payment source

## 2020-08-06 ENCOUNTER — Other Ambulatory Visit: Payer: Self-pay

## 2020-08-06 DIAGNOSIS — I69354 Hemiplegia and hemiparesis following cerebral infarction affecting left non-dominant side: Secondary | ICD-10-CM | POA: Diagnosis not present

## 2020-08-06 DIAGNOSIS — Z85028 Personal history of other malignant neoplasm of stomach: Secondary | ICD-10-CM

## 2020-08-06 DIAGNOSIS — Z6833 Body mass index (BMI) 33.0-33.9, adult: Secondary | ICD-10-CM | POA: Diagnosis not present

## 2020-08-06 DIAGNOSIS — I5032 Chronic diastolic (congestive) heart failure: Secondary | ICD-10-CM | POA: Diagnosis present

## 2020-08-06 DIAGNOSIS — N179 Acute kidney failure, unspecified: Secondary | ICD-10-CM | POA: Diagnosis present

## 2020-08-06 DIAGNOSIS — J449 Chronic obstructive pulmonary disease, unspecified: Secondary | ICD-10-CM | POA: Diagnosis present

## 2020-08-06 DIAGNOSIS — Z800 Family history of malignant neoplasm of digestive organs: Secondary | ICD-10-CM

## 2020-08-06 DIAGNOSIS — R300 Dysuria: Secondary | ICD-10-CM | POA: Diagnosis present

## 2020-08-06 DIAGNOSIS — E876 Hypokalemia: Secondary | ICD-10-CM | POA: Diagnosis present

## 2020-08-06 DIAGNOSIS — Z515 Encounter for palliative care: Secondary | ICD-10-CM

## 2020-08-06 DIAGNOSIS — R7401 Elevation of levels of liver transaminase levels: Secondary | ICD-10-CM

## 2020-08-06 DIAGNOSIS — R634 Abnormal weight loss: Secondary | ICD-10-CM | POA: Diagnosis present

## 2020-08-06 DIAGNOSIS — Z20822 Contact with and (suspected) exposure to covid-19: Secondary | ICD-10-CM | POA: Diagnosis present

## 2020-08-06 DIAGNOSIS — K760 Fatty (change of) liver, not elsewhere classified: Secondary | ICD-10-CM | POA: Diagnosis present

## 2020-08-06 DIAGNOSIS — R918 Other nonspecific abnormal finding of lung field: Secondary | ICD-10-CM | POA: Diagnosis present

## 2020-08-06 DIAGNOSIS — R739 Hyperglycemia, unspecified: Secondary | ICD-10-CM | POA: Diagnosis present

## 2020-08-06 DIAGNOSIS — K219 Gastro-esophageal reflux disease without esophagitis: Secondary | ICD-10-CM

## 2020-08-06 DIAGNOSIS — E669 Obesity, unspecified: Secondary | ICD-10-CM | POA: Diagnosis present

## 2020-08-06 DIAGNOSIS — Z8249 Family history of ischemic heart disease and other diseases of the circulatory system: Secondary | ICD-10-CM

## 2020-08-06 DIAGNOSIS — N1831 Chronic kidney disease, stage 3a: Secondary | ICD-10-CM | POA: Diagnosis present

## 2020-08-06 DIAGNOSIS — I130 Hypertensive heart and chronic kidney disease with heart failure and stage 1 through stage 4 chronic kidney disease, or unspecified chronic kidney disease: Secondary | ICD-10-CM | POA: Diagnosis present

## 2020-08-06 DIAGNOSIS — Z87891 Personal history of nicotine dependence: Secondary | ICD-10-CM

## 2020-08-06 DIAGNOSIS — I252 Old myocardial infarction: Secondary | ICD-10-CM | POA: Diagnosis not present

## 2020-08-06 DIAGNOSIS — Z91013 Allergy to seafood: Secondary | ICD-10-CM

## 2020-08-06 DIAGNOSIS — G4733 Obstructive sleep apnea (adult) (pediatric): Secondary | ICD-10-CM | POA: Diagnosis present

## 2020-08-06 DIAGNOSIS — Z955 Presence of coronary angioplasty implant and graft: Secondary | ICD-10-CM

## 2020-08-06 DIAGNOSIS — I10 Essential (primary) hypertension: Secondary | ICD-10-CM | POA: Diagnosis present

## 2020-08-06 DIAGNOSIS — E785 Hyperlipidemia, unspecified: Secondary | ICD-10-CM | POA: Diagnosis present

## 2020-08-06 DIAGNOSIS — C7951 Secondary malignant neoplasm of bone: Secondary | ICD-10-CM | POA: Diagnosis present

## 2020-08-06 DIAGNOSIS — Z7982 Long term (current) use of aspirin: Secondary | ICD-10-CM

## 2020-08-06 DIAGNOSIS — C787 Secondary malignant neoplasm of liver and intrahepatic bile duct: Secondary | ICD-10-CM | POA: Diagnosis present

## 2020-08-06 DIAGNOSIS — I2510 Atherosclerotic heart disease of native coronary artery without angina pectoris: Secondary | ICD-10-CM | POA: Diagnosis present

## 2020-08-06 DIAGNOSIS — R319 Hematuria, unspecified: Secondary | ICD-10-CM | POA: Diagnosis present

## 2020-08-06 DIAGNOSIS — Z79899 Other long term (current) drug therapy: Secondary | ICD-10-CM

## 2020-08-06 DIAGNOSIS — Z66 Do not resuscitate: Secondary | ICD-10-CM | POA: Diagnosis not present

## 2020-08-06 LAB — COMPREHENSIVE METABOLIC PANEL
ALT: 171 U/L — ABNORMAL HIGH (ref 0–44)
AST: 265 U/L — ABNORMAL HIGH (ref 15–41)
Albumin: 3.1 g/dL — ABNORMAL LOW (ref 3.5–5.0)
Alkaline Phosphatase: 199 U/L — ABNORMAL HIGH (ref 38–126)
Anion gap: 11 (ref 5–15)
BUN: 30 mg/dL — ABNORMAL HIGH (ref 8–23)
CO2: 29 mmol/L (ref 22–32)
Calcium: 9.9 mg/dL (ref 8.9–10.3)
Chloride: 101 mmol/L (ref 98–111)
Creatinine, Ser: 2.06 mg/dL — ABNORMAL HIGH (ref 0.61–1.24)
GFR, Estimated: 33 mL/min — ABNORMAL LOW (ref >60)
Glucose, Bld: 134 mg/dL — ABNORMAL HIGH (ref 70–99)
Potassium: 3.3 mmol/L — ABNORMAL LOW (ref 3.5–5.1)
Sodium: 141 mmol/L (ref 135–145)
Total Bilirubin: 14.9 mg/dL — ABNORMAL HIGH (ref 0.3–1.2)
Total Protein: 7.4 g/dL (ref 6.5–8.1)

## 2020-08-06 LAB — AMMONIA: Ammonia: 33 umol/L (ref 9–35)

## 2020-08-06 LAB — RESP PANEL BY RT-PCR (FLU A&B, COVID) ARPGX2
Influenza A by PCR: NEGATIVE
Influenza B by PCR: NEGATIVE
SARS Coronavirus 2 by RT PCR: NEGATIVE

## 2020-08-06 LAB — URINALYSIS, ROUTINE W REFLEX MICROSCOPIC
Glucose, UA: NEGATIVE mg/dL
Hgb urine dipstick: NEGATIVE
Ketones, ur: NEGATIVE mg/dL
Leukocytes,Ua: NEGATIVE
Nitrite: NEGATIVE
Protein, ur: NEGATIVE mg/dL
Specific Gravity, Urine: 1.016 (ref 1.005–1.030)
pH: 5 (ref 5.0–8.0)

## 2020-08-06 LAB — CBC WITH DIFFERENTIAL/PLATELET
Abs Immature Granulocytes: 0.03 10*3/uL (ref 0.00–0.07)
Basophils Absolute: 0 10*3/uL (ref 0.0–0.1)
Basophils Relative: 1 %
Eosinophils Absolute: 0.1 10*3/uL (ref 0.0–0.5)
Eosinophils Relative: 1 %
HCT: 41.8 % (ref 39.0–52.0)
Hemoglobin: 14.4 g/dL (ref 13.0–17.0)
Immature Granulocytes: 0 %
Lymphocytes Relative: 19 %
Lymphs Abs: 1.5 10*3/uL (ref 0.7–4.0)
MCH: 32.7 pg (ref 26.0–34.0)
MCHC: 34.4 g/dL (ref 30.0–36.0)
MCV: 95 fL (ref 80.0–100.0)
Monocytes Absolute: 0.9 10*3/uL (ref 0.1–1.0)
Monocytes Relative: 11 %
Neutro Abs: 5.1 10*3/uL (ref 1.7–7.7)
Neutrophils Relative %: 68 %
Platelets: 223 10*3/uL (ref 150–400)
RBC: 4.4 MIL/uL (ref 4.22–5.81)
RDW: 17.1 % — ABNORMAL HIGH (ref 11.5–15.5)
WBC: 7.6 10*3/uL (ref 4.0–10.5)
nRBC: 0 % (ref 0.0–0.2)

## 2020-08-06 LAB — PROTIME-INR
INR: 1.3 — ABNORMAL HIGH (ref 0.8–1.2)
Prothrombin Time: 15.9 seconds — ABNORMAL HIGH (ref 11.4–15.2)

## 2020-08-06 LAB — ACETAMINOPHEN LEVEL: Acetaminophen (Tylenol), Serum: <10 ug/mL — ABNORMAL LOW (ref 10–30)

## 2020-08-06 IMAGING — CT CT ABD-PELV W/O CM
2 of 4 series · 16 of 46 positions shown, 18 images · non-contrast
Comparison: None

CLINICAL DATA: Dysuria hematuria

EXAM:
CT ABDOMEN AND PELVIS WITHOUT CONTRAST
TECHNIQUE: Multidetector CT imaging of the abdomen and pelvis was performed
following the standard protocol without IV contrast.

[Series 4: coronal st · coronal · 0.98mm/px · 3 of 113 slices shown]
[im 38/113  soft-tissue]
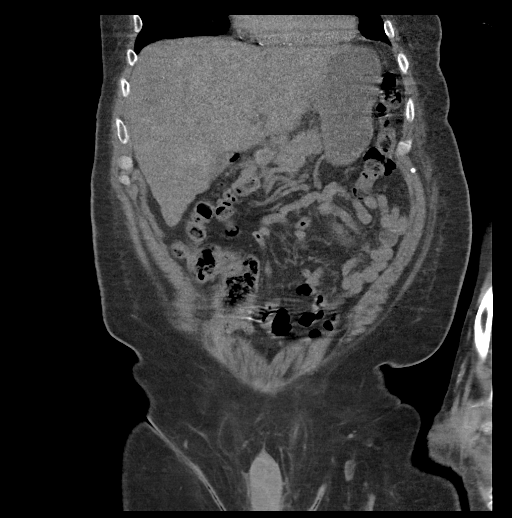
[im 50/113  soft-tissue]
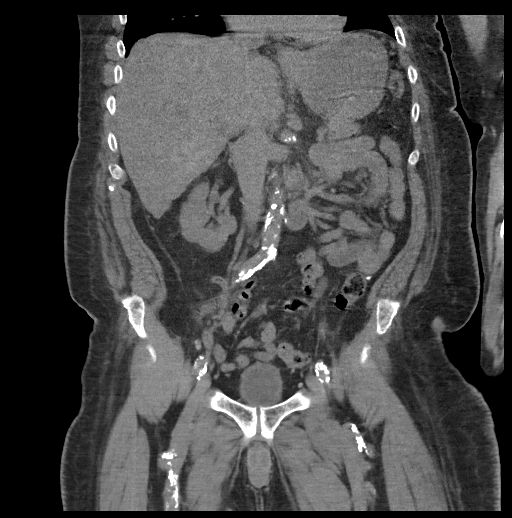
[im 63/113  soft-tissue]
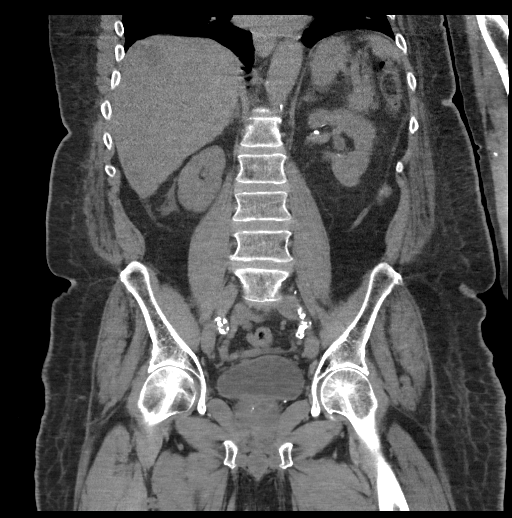

[Series 6: axial st · axial · 0.92mm/px · z∈[+653,+1088]mm · 13 of 97 slices shown, 15 images]
[im 5/97  soft-tissue]
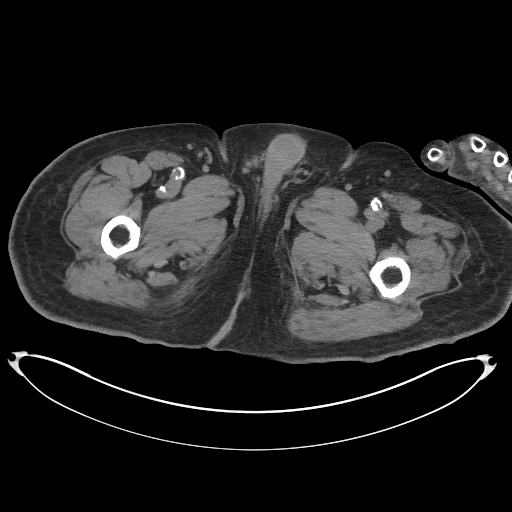
[im 5/97  bone]
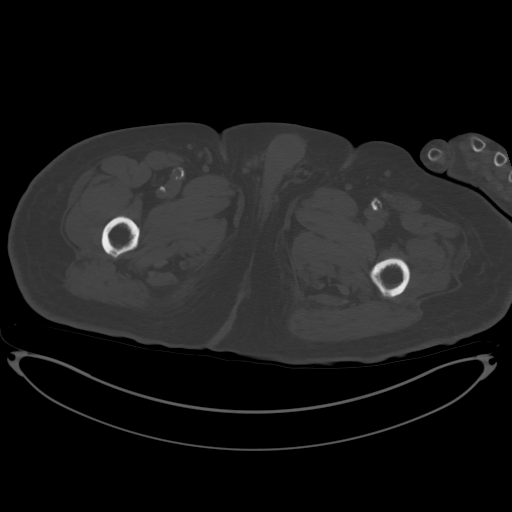
[im 15/97  soft-tissue]
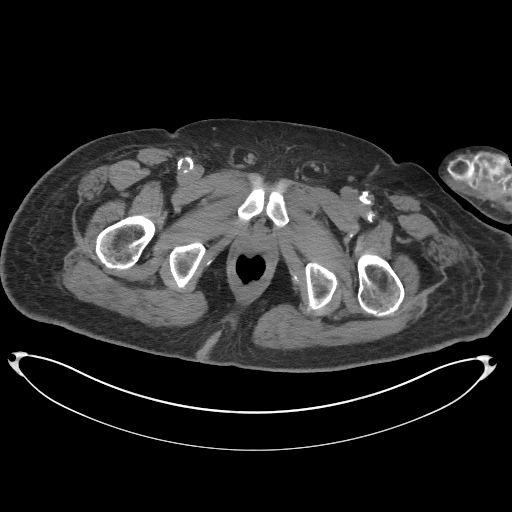
[im 20/97  soft-tissue]
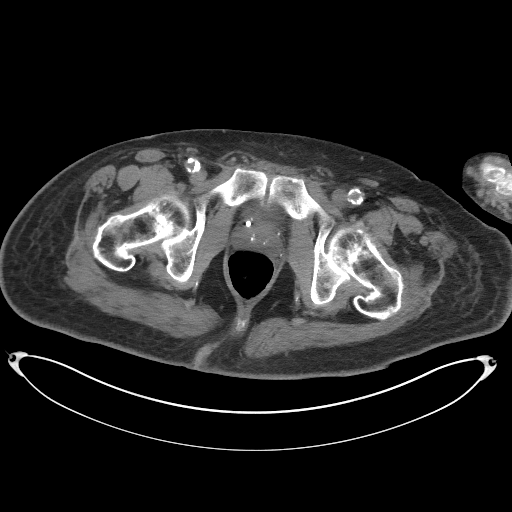
[im 29/97  soft-tissue]
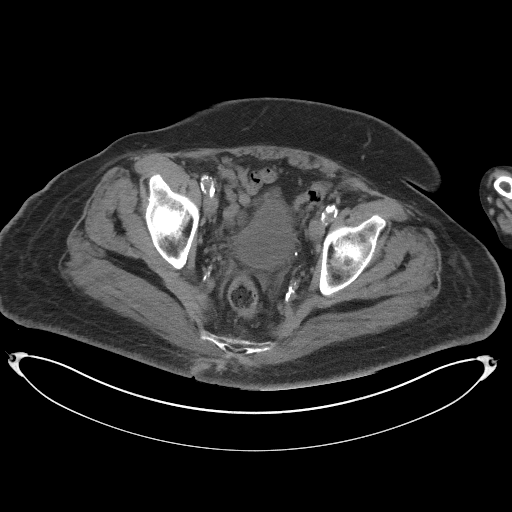
[im 34/97  soft-tissue]
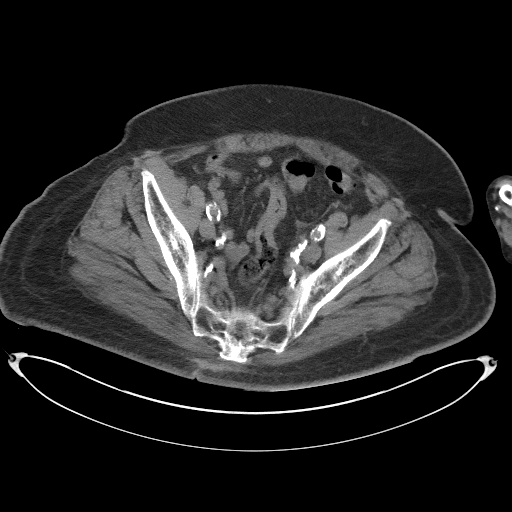
[im 44/97  soft-tissue]
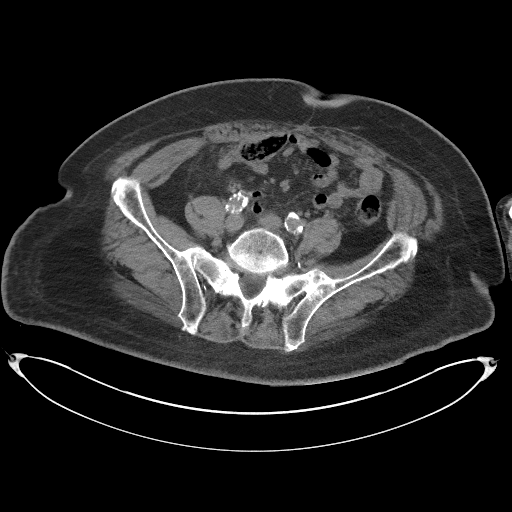
[im 49/97  soft-tissue]
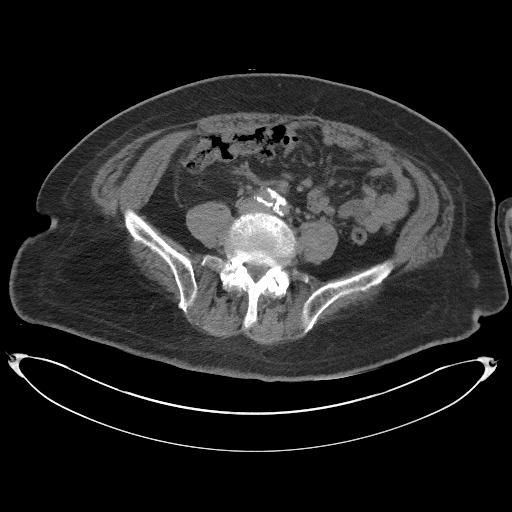
[im 53/97  soft-tissue]
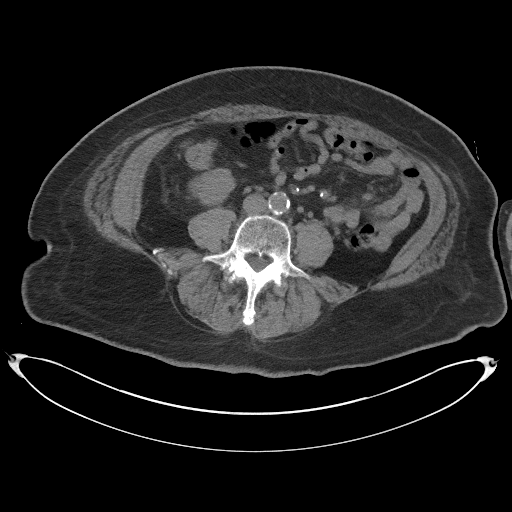
[im 63/97  soft-tissue]
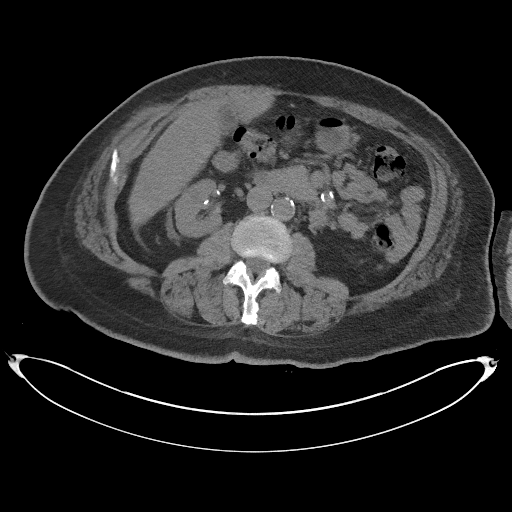
[im 63/97  bone]
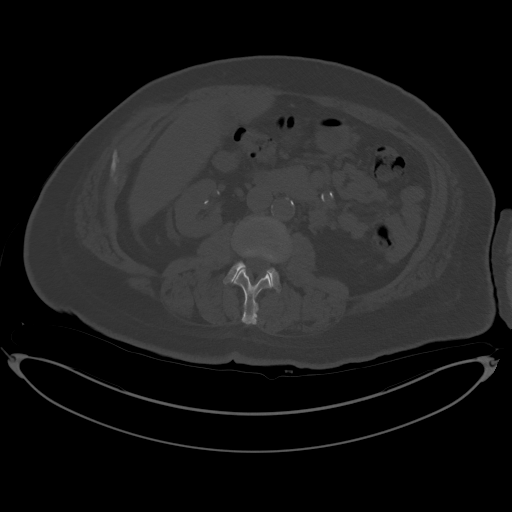
[im 68/97  soft-tissue]
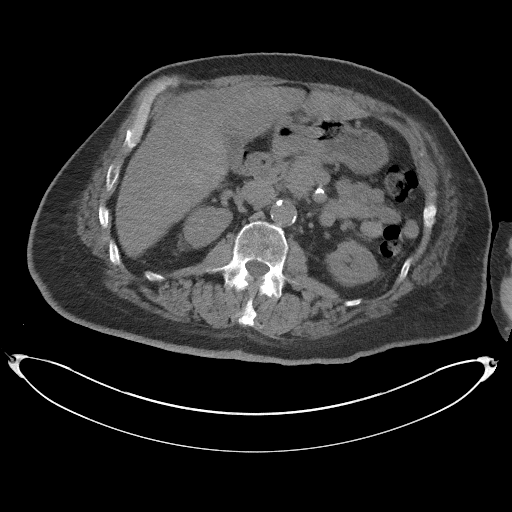
[im 77/97  soft-tissue]
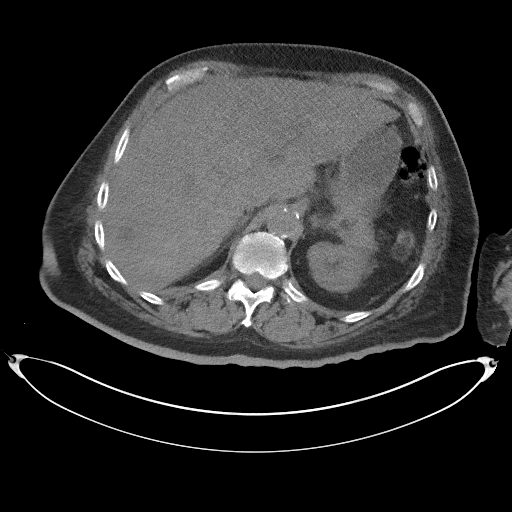
[im 82/97  soft-tissue]
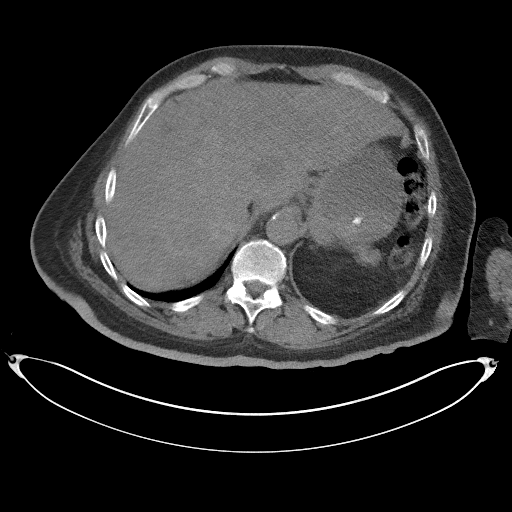
[im 92/97  soft-tissue]
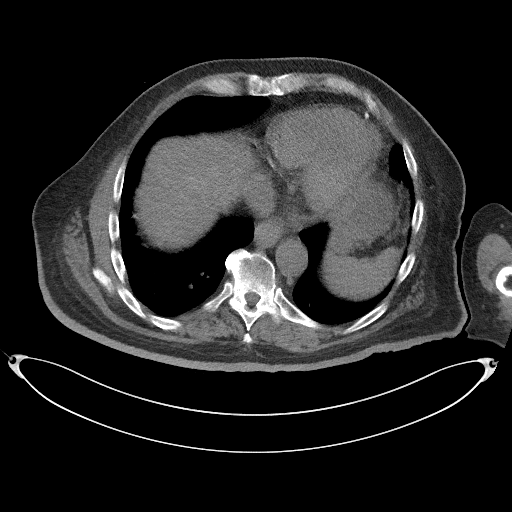

[16 of 46 positions shown; findings below may reference images not displayed]

FINDINGS: Lower chest: Lung bases demonstrate no acute consolidation or
effusion. Coronary vascular calcification.

Hepatobiliary: Hepatic steatosis. Possible contour nodularity of the
liver as may be seen with cirrhosis. Multiple ill-defined
low-attenuation masses within the liver, for example 2.5 cm
hypodense lesion in the right hepatic lobe series 6, image 8. No
calcified gallstone. No biliary dilatation

Pancreas: Unremarkable. No pancreatic ductal dilatation or
surrounding inflammatory changes.

Spleen: Normal in size without focal abnormality.

Adrenals/Urinary Tract: Right adrenal gland is normal. Diffusely
thickened left adrenal gland without well-defined mass. Kidneys show
no hydronephrosis. Intrarenal vascular calcifications on the left.
Probable cyst midpole right kidney. 15 mm exophytic slightly dense
lesion off the lower pole right kidney, series 6, image 41. Urinary
bladder unremarkable

Stomach/Bowel: The stomach is nonenlarged. There is no dilated small
bowel. There is diffuse diverticular disease of the colon without
acute wall thickening. Negative appendix.

Vascular/Lymphatic: Advanced aortic atherosclerosis without
aneurysm. No suspicious nodes.

Reproductive: Prostate calcification without obvious mass

Other: Negative for free air. Small amount of free fluid in the
pelvis and adjacent to the liver.

Musculoskeletal: No acute or suspicious osseous abnormality.
IMPRESSION: 1. No CT evidence for acute intra-abdominal or pelvic abnormality.
No hydronephrosis or ureteral stone.
2. Hepatic steatosis with possible contour nodularity/cirrhosis.
Multiple ill-defined hypodense liver lesions concerning for
metastatic disease or primary liver neoplasm. Trace ascites. When
the patient is clinically stable and able to follow directions and
hold their breath (preferably as an outpatient) further evaluation
with dedicated abdominal MRI should be considered.
3. 15 mm indeterminate exophytic lesion off the lower pole right
kidney, could also be evaluated at follow-up MRI.
4. Diverticular disease of the colon without acute wall thickening

## 2020-08-06 MED ORDER — SODIUM CHLORIDE 0.9 % IV BOLUS
500.0000 mL | Freq: Once | INTRAVENOUS | Status: AC
  Administered 2020-08-06: 500 mL via INTRAVENOUS

## 2020-08-06 NOTE — H&P (Signed)
History and Physical  Charles Sherman ZJQ:[id number] DOB: April 13, 1945 DOA: 08/06/2020  Referring physician: Margette Fast, MD PCP: Jacqlyn Larsen, MD  Patient coming from: Home  Chief Complaint: Blood in urine  HPI: Charles Sherman is a 75 y.o. male with medical history significant for hypertension, hyperlipidemia, CHF, GERD, CAD who presents to the emergency department due to about 10 days of darkening of urine, this was associated with lower abdominal pain and he thought this could be due to kidney stone, so he came to the ED for further evaluation.  He denies any chest pain, shortness of, fever, chills, nausea or vomiting. Patient endorsed improved with diet due to stomach cancer at the age of 37.  ED Course:  In the emergency department, he was bradycardic and intermittently tachypneic, BP was soft at 101/49, but O2 sat was 94% to 99% on room air.  Work-up in the ED showed normal CBC, hypokalemia, hyperglycemia, BUN/creatinine 30/2.06 (baseline creatinine at 1.2-1.4), elevated liver enzymes and total bilirubin at 14.9.  Ammonia 33, acetaminophen < 10, urinalysis was unimpressive for UTI.  Influenza A, B, SARS coronavirus 2 was negative CT abdomen and pelvis without contrast showed  No CT evidence for acute intra-abdominal or pelvic abnormality.  No hydronephrosis or ureteral stone. Hepatic steatosis with possible contour nodularity/cirrhosis. Multiple ill-defined hypodense liver lesions concerning for metastatic disease or primary liver neoplasm. IV hydration was provided.  He was consulted by ED physician, MRI of abdomen was recommended and GI will follow up with patient in the morning.  Hospitalist was asked to admit patient for further evaluation and management.  Review of Systems: Constitutional: Negative for chills and fever.  HENT: Negative for ear pain and sore throat.   Eyes: Negative for pain and visual disturbance.  Respiratory: Negative for cough, chest tightness and  shortness of breath.   Cardiovascular: Negative for chest pain and palpitations.  Gastrointestinal: Negative for abdominal pain and vomiting.  Endocrine: Negative for polyphagia and polyuria.  Genitourinary: Positive for suspicion of blood in urine negative for decreased urine volume, dysuria, enuresis Musculoskeletal: Negative for arthralgias and back pain.  Skin: Negative for color change and rash.  Allergic/Immunologic: Negative for immunocompromised state.  Neurological: Negative for tremors, syncope, speech difficulty, weakness, light-headedness and headaches.  Hematological: Does not bruise/bleed easily.  All other systems reviewed and are negative    Past Medical History:  Diagnosis Date   CHF (congestive heart failure) (HCC)    COPD (chronic obstructive pulmonary disease) (HCC)    HTN (hypertension)    Myocardial infarct (HCC)    OSA (obstructive sleep apnea)    on CPAP   Stroke [facility])    Past Surgical History:  Procedure Laterality Date   IR ANGIO INTRA EXTRACRAN SEL COM CAROTID INNOMINATE BILAT MOD SED  11/19/2019   IR ANGIO VERTEBRAL SEL VERTEBRAL BILAT MOD SED  11/19/2019   IR CT HEAD LTD  11/21/2019   IR INTRAVSC STENT CERV CAROTID W/O EMB-PROT MOD SED INC ANGIO  11/21/2019   IR US GUIDE VASC ACCESS RIGHT  11/19/2019   PERCUTANEOUS CORONARY STENT INTERVENTION (PCI-S)     RADIOLOGY WITH ANESTHESIA Right 11/21/2019   Procedure: RIGHT CAROTID STENTING;  Surgeon: Luanne Bras, MD;  Location: [place];  Service: Radiology;  Laterality: Right;    Social History:  reports that he has quit smoking. His smoking use included cigarettes. He smoked an average of 0.50 packs per day. He has never used smokeless tobacco. He reports previous alcohol use of about  1.0 standard drink of alcohol per week. He reports that he does not use drugs.   Allergies  Allergen Reactions   Shrimp [Shellfish Allergy]     Family History  Problem Relation Age of Onset   Alzheimer's disease Mother     Heart disease Father    Diabetes Sister    Stomach cancer Brother       Prior to Admission medications   Medication Sig Start Date End Date Taking? Authorizing Provider  acetaminophen (TYLENOL) 325 MG tablet Take 2 tablets (650 mg total) by mouth every 4 (four) hours as needed for mild pain (or temp > 37.5 C (99.5 F)). 12/19/19   Angiulli, Lavon Paganini, PA-C  albuterol (VENTOLIN HFA) 108 (90 Base) MCG/ACT inhaler Inhale 2 puffs into the lungs every 6 (six) hours as needed for wheezing or shortness of breath.    [provider]  amLODipine (NORVASC) 10 MG tablet Take 10 mg by mouth daily.     [provider]  aspirin EC 81 MG tablet Take 81 mg by mouth daily.    [provider]  atorvastatin (LIPITOR) 80 MG tablet Take 1 tablet (80 mg total) by mouth daily at 6 PM. 12/19/19   Angiulli, Lavon Paganini, PA-C  docusate sodium (COLACE) 100 MG capsule Take 100 mg by mouth daily as needed for mild constipation.    [provider]  furosemide (LASIX) 20 MG tablet Take 1 tablet (20 mg total) by mouth daily. 08/04/20 11/02/20  Strader, Fransisco Hertz, PA-C  isosorbide mononitrate (IMDUR) 120 MG 24 hr tablet Take 120 mg by mouth daily. Patient not taking: Reported on 08/01/2020    [provider]  metolazone (ZAROXOLYN) 2.5 MG tablet Take 1 tablet (2.5 mg total) by mouth once a week. START HOLD ON 08/04/20 08/04/20   Ahmed Prima, Fransisco Hertz, PA-C  metoprolol tartrate (LOPRESSOR) 25 MG tablet Take 25 mg by mouth 2 (two) times daily.    [provider]  Multiple Vitamin (MULTIVITAMIN) tablet Take 1 tablet by mouth daily.    [provider]  nitroGLYCERIN (NITROSTAT) 0.4 MG SL tablet Place 0.4 mg under the tongue every 5 (five) minutes x 3 doses as needed for chest pain (If no relief after 3 rd dose report to the ED).    [provider]  pantoprazole (PROTONIX) 40 MG tablet Take 1 tablet (40 mg total) by mouth daily. 12/20/19   Angiulli, Lavon Paganini, PA-C   potassium chloride (KLOR-CON) 10 MEQ tablet Take 1 tablet (10 mEq total) by mouth 2 (two) times daily. 12/19/19   Angiulli, Lavon Paganini, PA-C  ticagrelor (BRILINTA) 90 MG TABS tablet Take 1 tablet (90 mg total) by mouth 2 (two) times daily. Patient not taking: No sig reported 12/19/19   Angiulli, Lavon Paganini, PA-C    Physical Exam: BP 110/70   Pulse (!) 54   Temp 98.4 F (36.9 C) (Oral)   Resp 18   Ht 5\' 11"  (1.803 m)   Wt 98.4 kg   SpO2 98%   BMI 30.27 kg/m   General: 75 y.o. year-old male well developed well nourished in no acute distress.  Alert and oriented x3. HEENT: NCAT, EOMI, icteric sclera Neck: Supple, trachea medial Cardiovascular: Regular rate and rhythm with no rubs or gallops.  No thyromegaly or JVD noted.  No lower extremity edema. 2/4 pulses in all 4 extremities. Respiratory: Clear to auscultation with no wheezes or rales. Good inspiratory effort. Abdomen: Soft, nontender nondistended with normal bowel sounds x4 quadrants.  Muskuloskeletal: No cyanosis, clubbing or edema noted bilaterally Neuro: CN II-XII intact, strength 5/5 x 4, sensation, reflexes intact Skin: No ulcerative lesions noted or rashes Psychiatry: Mood is appropriate for condition and setting          Labs on Admission:  Basic Metabolic Panel: Recent Labs  Lab 08/01/20 1434 08/06/20 1733  NA 139 141  K 3.1* 3.3*  CL 99 101  CO2 29 29  GLUCOSE 133* 134*  BUN 36* 30*  CREATININE 2.21* 2.06*  CALCIUM 9.6 9.9   Liver Function Tests: Recent Labs  Lab 08/06/20 1733  AST 265*  ALT 171*  ALKPHOS 199*  BILITOT 14.9*  PROT 7.4  ALBUMIN 3.1*   No results for input(s): LIPASE, AMYLASE in the last 168 hours. Recent Labs  Lab 08/06/20 2018  AMMONIA 33   CBC: Recent Labs  Lab 08/01/20 1434 08/06/20 1733  WBC 6.9 7.6  NEUTROABS  --  5.1  HGB 13.7 14.4  HCT 40.3 41.8  MCV 94.6 95.0  PLT 212 223   Cardiac Enzymes: No results for input(s): CKTOTAL, CKMB, CKMBINDEX, TROPONINI in the  last 168 hours.  BNP (last 3 results) No results for input(s): BNP in the last 8760 hours.  ProBNP (last 3 results) No results for input(s): PROBNP in the last 8760 hours.  CBG: No results for input(s): GLUCAP in the last 168 hours.  Radiological Exams on Admission: CT ABDOMEN PELVIS WO CONTRAST  Result Date: 08/06/2020 CLINICAL DATA:  Dysuria hematuria EXAM: CT ABDOMEN AND PELVIS WITHOUT CONTRAST TECHNIQUE: Multidetector CT imaging of the abdomen and pelvis was performed following the standard protocol without IV contrast. COMPARISON:  None FINDINGS: Lower chest: Lung bases demonstrate no acute consolidation or effusion. Coronary vascular calcification. Hepatobiliary: Hepatic steatosis. Possible contour nodularity of the liver as may be seen with cirrhosis. Multiple ill-defined low-attenuation masses within the liver, for example 2.5 cm hypodense lesion in the right hepatic lobe series 6, image 8. No calcified gallstone. No biliary dilatation Pancreas: Unremarkable. No pancreatic ductal dilatation or surrounding inflammatory changes. Spleen: Normal in size without focal abnormality. Adrenals/Urinary Tract: Right adrenal gland is normal. Diffusely thickened left adrenal gland without well-defined mass. Kidneys show no hydronephrosis. Intrarenal vascular calcifications on the left. Probable cyst midpole right kidney. 15 mm exophytic slightly dense lesion off the lower pole right kidney, series 6, image 41. Urinary bladder unremarkable Stomach/Bowel: The stomach is nonenlarged. There is no dilated small bowel. There is diffuse diverticular disease of the colon without acute wall thickening. Negative appendix. Vascular/Lymphatic: Advanced aortic atherosclerosis without aneurysm. No suspicious nodes. Reproductive: Prostate calcification without obvious mass Other: Negative for free air. Small amount of free fluid in the pelvis and adjacent to the liver. Musculoskeletal: No acute or suspicious osseous  abnormality. IMPRESSION: 1. No CT evidence for acute intra-abdominal or pelvic abnormality. No hydronephrosis or ureteral stone. 2. Hepatic steatosis with possible contour nodularity/cirrhosis. Multiple ill-defined hypodense liver lesions concerning for metastatic disease or primary liver neoplasm. Trace ascites. When the patient is clinically stable and able to follow directions and hold their breath (preferably as an outpatient) further evaluation with dedicated abdominal MRI should be considered. 3. 15 mm indeterminate exophytic lesion off the lower pole right kidney, could also be evaluated at follow-up MRI. 4. Diverticular disease of the colon without acute wall thickening Electronically Signed   By: Donavan Foil M.D.   On: 08/06/2020 20:08    EKG: I independently viewed the EKG done and my findings are as followed: EKG  was not done in the ED  Assessment/Plan Present on Admission:  Hyperbilirubinemia  AKI (acute kidney injury) ([place])  Essential hypertension  Principal Problem:   Hyperbilirubinemia Active Problems:   Chronic diastolic CHF (congestive heart failure) (HCC)   Essential hypertension   AKI (acute kidney injury) (HCC)   Hepatic steatosis   Hypokalemia   Transaminitis   Abdominal pain   Obesity (BMI 30.0-34.9)   GERD (gastroesophageal reflux disease)   CAD (coronary artery disease)  Hyperbilirubinemia, transaminitis and hepatic steatosis Multiple ill-defined hypodense liver lesions Total bilirubin 14.9, AST 265, ALT 171, ALP 199 This may be due to the liver lesions suspected to be concerning for metastatic disease or primary liver neoplasm MRI of the abdomen will be done in the morning Hepatitis panel will be done in the morning Gastroenterology was consulted by ED physician follow-up with patient in the morning  Hypokalemia K+ 3.3; this will be replenished  Acute kidney injury BUN/creatinine 30/2.06 (baseline creatinine at 1.2-1.4) Renally adjust medications, avoid  nephrotoxic agents/dehydration/hypotension  GERD Continue Protonix  Essential hypertension Continue home meds when patient resumes oral intake  CAD Continue home meds when patient resumes oral intake  COPD Resume home albuterol when patient resumes oral intake  OSA on CPAP Patient appears to be compliant only intermittently CPAP will be provided   DVT prophylaxis: SCDs  Code Status: Full code  Family Communication: None at bedside  Disposition Plan:  Patient is from:                        home Anticipated DC to:                   SNF or family members home Anticipated DC date:               2-3 days Anticipated DC barriers:          Patient requires inpatient management for work-up on elevated liver enzymes with suspicion for possible metastatic liver disease and pending gastroenterology consult    Consults called: Gastroenterology  Admission status: Inpatient    Bernadette Hoit MD [facility]   08/07/2020, 5:11 AM

## 2020-08-06 NOTE — ED Provider Notes (Signed)
Emergency Medicine Provider Triage Evaluation Note  Charles Sherman , a 75 y.o. male  was evaluated in triage.  Pt complains of seeing blood in his urine, denies any pain at this time, he is concerned for kidney problems..  Review of Systems  Positive: Blood in urine Negative: Denies urinary urgency, frequency, no stomach pain  Physical Exam  BP (!) 91/54 (BP Location: Right Arm)   Pulse (!) 54   Temp 98.4 F (36.9 C) (Oral)   Resp 20   Ht 5\' 11"  (1.803 m)   Wt 98.4 kg   SpO2 95%   BMI 30.27 kg/m  Gen:   Awake, no distress   Resp:  Normal effort  MSK:   Moves extremities without difficulty  Other:    Medical Decision Making  Medically screening exam initiated at 5:10 PM.  Appropriate orders placed.  Charles Sherman was informed that the remainder of the evaluation will be completed by another provider, this initial triage assessment does not replace that evaluation, and the importance of remaining in the ED until their evaluation is complete.  Patient was seen laboratory has been ordered, patient will need further work-up here in the emergency department.   [name], PA-C 08/06/20 1711    Margette Fast, MD 08/11/20 [phone]

## 2020-08-06 NOTE — ED Triage Notes (Signed)
Pt to er via ems, per ems pt is here for lower abd pain, states that for the past two weeks he has had dark cloudy urine, states that now it is also becoming malodorous.

## 2020-08-06 NOTE — ED Provider Notes (Signed)
Emergency Department Provider Note   I have reviewed the triage vital signs and the nursing notes.   HISTORY  Chief Complaint Dysuria   HPI Charles Sherman is a 75 y.o. male with past medical history reviewed below including hypertension, CHF, and mild renal insufficiency presents to the emergency department with darkening of the urine.  He has noticed this over the past 7 to 10 days.  He has had some associated mainly lower abdominal pain without radiation of symptoms.  No fevers or chills.  No radiation of discomfort up into the chest.  Patient is not taking Tylenol.  No other new medications.  His cardiology team has cut back his home metolazone with worsening kidney function in the office the other day.  Past Medical History:  Diagnosis Date   CHF (congestive heart failure) (HCC)    COPD (chronic obstructive pulmonary disease) (HCC)    HTN (hypertension)    Myocardial infarct (HCC)    OSA (obstructive sleep apnea)    on CPAP   Stroke [facility])     Patient Active Problem List   Diagnosis Date Noted   Lung mass 08/08/2020   Hepatic steatosis 08/07/2020   Hypokalemia 08/07/2020   Transaminitis 08/07/2020   Obesity (BMI 30.0-34.9) 08/07/2020   GERD (gastroesophageal reflux disease) 08/07/2020   CAD (coronary artery disease) 08/07/2020   Metastatic cancer to liver ([place])    Hyperbilirubinemia 08/06/2020   Slow transit constipation    Thrombocytopenia (HCC)    AKI (acute kidney injury) ([place])    Hemiparesis affecting left side as late effect of stroke (HCC)    Morbid obesity (HCC)    Dysphagia, post-stroke    Chronic diastolic CHF (congestive heart failure) ([place])    Essential hypertension    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   Cerebral thrombosis with cerebral infarction 11/15/2019   Meningioma ([place]) 11/14/2019    Past Surgical History:  Procedure Laterality Date   IR ANGIO INTRA EXTRACRAN SEL COM  CAROTID INNOMINATE BILAT MOD SED  11/19/2019   IR ANGIO VERTEBRAL SEL VERTEBRAL BILAT MOD SED  11/19/2019   IR CT HEAD LTD  11/21/2019   IR INTRAVSC STENT CERV CAROTID W/O EMB-PROT MOD SED INC ANGIO  11/21/2019   IR US GUIDE VASC ACCESS RIGHT  11/19/2019   PERCUTANEOUS CORONARY STENT INTERVENTION (PCI-S)     RADIOLOGY WITH ANESTHESIA Right 11/21/2019   Procedure: RIGHT CAROTID STENTING;  Surgeon: Luanne Bras, MD;  Location: [place];  Service: Radiology;  Laterality: Right;    Allergies Shrimp [shellfish allergy]  Family History  Problem Relation Age of Onset   Alzheimer's disease Mother    Heart disease Father    Diabetes Sister    Stomach cancer Brother     Social History Social History   Tobacco Use   Smoking status: Former    Packs/day: 0.50    Pack years: 0.00    Types: Cigarettes   Smokeless tobacco: Never  Vaping Use   Vaping Use: Never used  Substance Use Topics   Alcohol use: Not Currently    Alcohol/week: 1.0 standard drink    Types: 1 Shots of liquor per week   Drug use: Never    Review of Systems  Constitutional: No fever/chills Eyes: No visual changes. Positive discoloration of eye.  ENT: No sore throat. Cardiovascular: Denies chest pain. Respiratory: Denies shortness of breath. Gastrointestinal: Positive abdominal pain.  No nausea, no vomiting.  No diarrhea.  No constipation. Genitourinary: Negative for dysuria. Musculoskeletal: Negative for back pain. Skin: Negative for rash. Neurological: Negative for headaches, focal weakness or numbness.  10-point ROS otherwise negative.  ____________________________________________   PHYSICAL EXAM:  VITAL SIGNS: ED Triage Vitals  Enc Vitals Group     BP 08/06/20 1507 (!) 91/54     Pulse Rate 08/06/20 1507 (!) 54     Resp 08/06/20 1507 20     Temp 08/06/20 1507 98.4 F (36.9 C)     Temp Source 08/06/20 1507 Oral     SpO2 08/06/20 1507 95 %     Weight 08/06/20 1505 217 lb (98.4 kg)     Height  08/06/20 1505 5\' 11"  (1.803 m)   Constitutional: Alert and oriented. Well appearing and in no acute distress. Eyes: Conjunctivae are normal. Positive scleral icterus.  Head: Atraumatic. Nose: No congestion/rhinnorhea. Mouth/Throat: Mucous membranes are moist.  Oropharynx non-erythematous. Neck: No stridor.   Cardiovascular: Normal rate, regular rhythm. Good peripheral circulation. Grossly normal heart sounds.   Respiratory: Normal respiratory effort.  No retractions. Lungs CTAB. Gastrointestinal: Soft and nontender. Mild distention.  Musculoskeletal: No lower extremity tenderness nor edema. No gross deformities of extremities. Neurologic:  Normal speech and language. No gross focal neurologic deficits are appreciated.  Skin:  Skin is warm, dry and intact. No rash noted.   ____________________________________________   LABS (all labs ordered are listed, but only abnormal results are displayed)  Labs Reviewed  URINALYSIS, ROUTINE W REFLEX MICROSCOPIC - Abnormal; Notable for the following components:      Result Value   Color, Urine AMBER (*)    APPearance HAZY (*)    Bilirubin Urine SMALL (*)    All other components within normal limits  COMPREHENSIVE METABOLIC PANEL - Abnormal; Notable for the following components:   Potassium 3.3 (*)    Glucose, Bld 134 (*)    BUN 30 (*)    Creatinine, Ser 2.06 (*)    Albumin 3.1 (*)    AST 265 (*)    ALT 171 (*)    Alkaline Phosphatase 199 (*)    Total Bilirubin 14.9 (*)    GFR, Estimated 33 (*)    All other components within normal limits  CBC WITH DIFFERENTIAL/PLATELET - Abnormal; Notable for the following components:   RDW 17.1 (*)    All other components within normal limits  PROTIME-INR - Abnormal; Notable for the following components:   Prothrombin Time 15.9 (*)    INR 1.3 (*)    All other components within normal limits  ACETAMINOPHEN LEVEL - Abnormal; Notable for the following components:   Acetaminophen (Tylenol), Serum <10  (*)    All other components within normal limits  COMPREHENSIVE METABOLIC PANEL - Abnormal; Notable for the following components:   Glucose, Bld 100 (*)    BUN 31 (*)    Creatinine, Ser 1.97 (*)    Albumin 2.9 (*)    AST 228 (*)    ALT 151 (*)    Alkaline Phosphatase 181 (*)    Total Bilirubin 14.6 (*)    GFR, Estimated 35 (*)    All other components within normal limits  CBC - Abnormal; Notable for the following components:   RDW 17.1 (*)    All other components within normal limits  PROTIME-INR - Abnormal; Notable for the following components:   Prothrombin Time 15.4 (*)    All other components within normal limits  MAGNESIUM - Abnormal; Notable for the following components:   Magnesium  2.5 (*)    All other components within normal limits  CEA - Abnormal; Notable for the following components:   CEA 6.7 (*)    All other components within normal limits  CBC WITH DIFFERENTIAL/PLATELET - Abnormal; Notable for the following components:   RDW 18.9 (*)    Monocytes Absolute 1.6 (*)    All other components within normal limits  COMPREHENSIVE METABOLIC PANEL - Abnormal; Notable for the following components:   BUN 28 (*)    Creatinine, Ser 1.51 (*)    Total Protein 6.1 (*)    Albumin 2.5 (*)    AST 233 (*)    ALT 124 (*)    Alkaline Phosphatase 161 (*)    Total Bilirubin 17.3 (*)    GFR, Estimated 48 (*)    All other components within normal limits  RESP PANEL BY RT-PCR (FLU A&B, COVID) ARPGX2  HEPATITIS PANEL, ACUTE  AMMONIA  APTT  PHOSPHORUS  AFP TUMOR MARKER    ____________________________________________  RADIOLOGY  CT abdomen reviewed.  ____________________________________________   PROCEDURES  Procedure(s) performed:   Procedures  None  ____________________________________________   INITIAL IMPRESSION / ASSESSMENT AND PLAN / ED COURSE  Pertinent labs & imaging results that were available during my care of the patient were reviewed by me and considered  in my medical decision making (see chart for details).   Patient presents with darkened urine and abdominal discomfort.  His exam is consistent with scleral icterus and his lab work shows LFTs and bilirubin elevation with bilirubin nearing 15.  No fevers.  Patient's kidney function is slightly worse.  Will discuss with GI given the patient's CT results.   08:53 PM  Poke with Dr. Melony Overly with GI regarding the patient's CT read and lab work.  Bilirubin is very high but liver function seems somewhat preserved with INR of 1.3.  Recommends getting an MRI of the abdomen first thing tomorrow morning.  His team can consult.  He may have an obstructive etiology which could be stented.  If infiltrative causing this a biopsy may be possible this week.  His team will follow along.  He does have some worsening creatinine here which could also be treated as an inpatient.  Will discuss with the hospitalist team.   Discussed patient's case with [facility] to request admission. Patient and family (if present) updated with plan. Care transferred to [facility] service.  I reviewed all nursing notes, vitals, pertinent old records, EKGs, labs, imaging (as available).  ____________________________________________  FINAL CLINICAL IMPRESSION(S) / ED DIAGNOSES  Final diagnoses:  AKI (acute kidney injury) ([place])  Hyperbilirubinemia    MEDICATIONS GIVEN DURING THIS VISIT:  Medications  lactated ringers infusion (0 mLs Intravenous Stopped 08/08/20 1643)  HYDROmorphone (DILAUDID) injection 0.5 mg (0.5 mg Intravenous Given 08/09/20 0300)  sodium chloride 0.9 % bolus 500 mL (0 mLs Intravenous Stopped 08/07/20 0139)  potassium chloride SA (KLOR-CON) CR tablet 20 mEq (20 mEq Oral Given 08/07/20 0646)  gadobutrol (GADAVIST) 1 MMOL/ML injection 10 mL (10 mLs Intravenous Contrast Given 08/07/20 0817)    Note:  This document was prepared using Dragon voice recognition software and may include unintentional dictation errors.  Nanda Quinton, MD,  [facility] Emergency Medicine    Zolton Dowson, [facility], MD 08/09/20 1104

## 2020-08-06 NOTE — ED Notes (Signed)
Patient transported to CT 

## 2020-08-07 ENCOUNTER — Other Ambulatory Visit: Payer: Self-pay

## 2020-08-07 ENCOUNTER — Encounter (HOSPITAL_COMMUNITY): Payer: Self-pay | Admitting: Internal Medicine

## 2020-08-07 ENCOUNTER — Inpatient Hospital Stay (HOSPITAL_COMMUNITY): Payer: No Typology Code available for payment source

## 2020-08-07 DIAGNOSIS — I5032 Chronic diastolic (congestive) heart failure: Secondary | ICD-10-CM

## 2020-08-07 DIAGNOSIS — E876 Hypokalemia: Secondary | ICD-10-CM

## 2020-08-07 DIAGNOSIS — R109 Unspecified abdominal pain: Secondary | ICD-10-CM | POA: Insufficient documentation

## 2020-08-07 DIAGNOSIS — K219 Gastro-esophageal reflux disease without esophagitis: Secondary | ICD-10-CM

## 2020-08-07 DIAGNOSIS — I10 Essential (primary) hypertension: Secondary | ICD-10-CM

## 2020-08-07 DIAGNOSIS — N179 Acute kidney failure, unspecified: Secondary | ICD-10-CM

## 2020-08-07 DIAGNOSIS — C787 Secondary malignant neoplasm of liver and intrahepatic bile duct: Principal | ICD-10-CM

## 2020-08-07 DIAGNOSIS — I2510 Atherosclerotic heart disease of native coronary artery without angina pectoris: Secondary | ICD-10-CM

## 2020-08-07 DIAGNOSIS — K760 Fatty (change of) liver, not elsewhere classified: Secondary | ICD-10-CM

## 2020-08-07 DIAGNOSIS — E669 Obesity, unspecified: Secondary | ICD-10-CM

## 2020-08-07 DIAGNOSIS — R7401 Elevation of levels of liver transaminase levels: Secondary | ICD-10-CM

## 2020-08-07 LAB — COMPREHENSIVE METABOLIC PANEL
ALT: 151 U/L — ABNORMAL HIGH (ref 0–44)
AST: 228 U/L — ABNORMAL HIGH (ref 15–41)
Albumin: 2.9 g/dL — ABNORMAL LOW (ref 3.5–5.0)
Alkaline Phosphatase: 181 U/L — ABNORMAL HIGH (ref 38–126)
Anion gap: 9 (ref 5–15)
BUN: 31 mg/dL — ABNORMAL HIGH (ref 8–23)
CO2: 28 mmol/L (ref 22–32)
Calcium: 9.8 mg/dL (ref 8.9–10.3)
Chloride: 103 mmol/L (ref 98–111)
Creatinine, Ser: 1.97 mg/dL — ABNORMAL HIGH (ref 0.61–1.24)
GFR, Estimated: 35 mL/min — ABNORMAL LOW (ref >60)
Glucose, Bld: 100 mg/dL — ABNORMAL HIGH (ref 70–99)
Potassium: 3.8 mmol/L (ref 3.5–5.1)
Sodium: 140 mmol/L (ref 135–145)
Total Bilirubin: 14.6 mg/dL — ABNORMAL HIGH (ref 0.3–1.2)
Total Protein: 6.6 g/dL (ref 6.5–8.1)

## 2020-08-07 LAB — PROTIME-INR
INR: 1.2 (ref 0.8–1.2)
Prothrombin Time: 15.4 seconds — ABNORMAL HIGH (ref 11.4–15.2)

## 2020-08-07 LAB — CBC
HCT: 40.3 % (ref 39.0–52.0)
Hemoglobin: 14.1 g/dL (ref 13.0–17.0)
MCH: 32.7 pg (ref 26.0–34.0)
MCHC: 35 g/dL (ref 30.0–36.0)
MCV: 93.5 fL (ref 80.0–100.0)
Platelets: 171 10*3/uL (ref 150–400)
RBC: 4.31 MIL/uL (ref 4.22–5.81)
RDW: 17.1 % — ABNORMAL HIGH (ref 11.5–15.5)
WBC: 8.7 10*3/uL (ref 4.0–10.5)
nRBC: 0 % (ref 0.0–0.2)

## 2020-08-07 LAB — APTT: aPTT: 25 seconds (ref 24–36)

## 2020-08-07 LAB — PHOSPHORUS: Phosphorus: 3.3 mg/dL (ref 2.5–4.6)

## 2020-08-07 LAB — MAGNESIUM: Magnesium: 2.5 mg/dL — ABNORMAL HIGH (ref 1.7–2.4)

## 2020-08-07 IMAGING — MR MR ABDOMEN W/O CM
5 series · 48 of 48 positions shown · non-contrast
Comparison: No prior abdominal MRI. CT the abdomen and pelvis
[DATE].

CLINICAL DATA: 74-year-old male with history of abnormal CT
examination concerning for metastatic disease to the liver.
Follow-up study.

EXAM:
MRI ABDOMEN WITHOUT CONTRAST
TECHNIQUE: Multiplanar multisequence MR imaging was performed without the
administration of intravenous contrast.

[Series 3: cor haste · coronal · 6.0mm · 1.41mm/px · 8 of 32 slices shown]
[im 1/32]
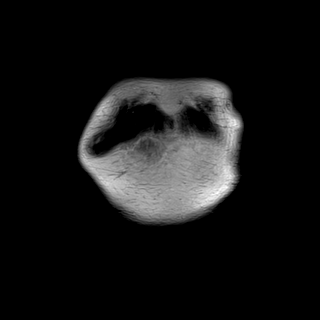
[im 5/32]
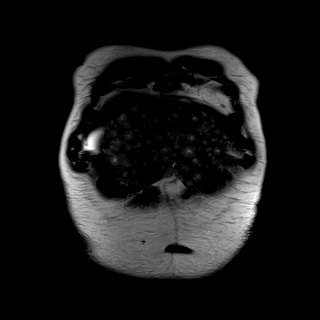
[im 9/32]
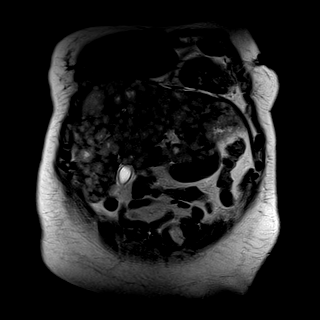
[im 14/32]
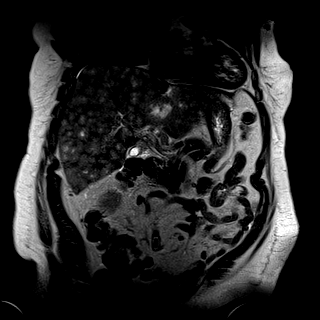
[im 18/32]
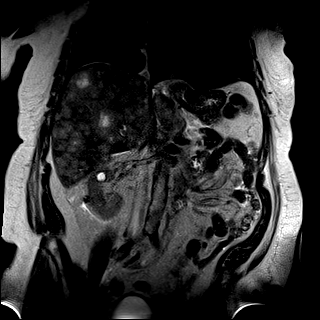
[im 23/32]
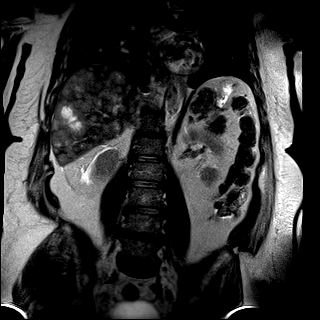
[im 27/32]
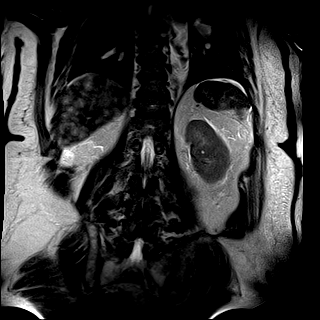
[im 32/32]
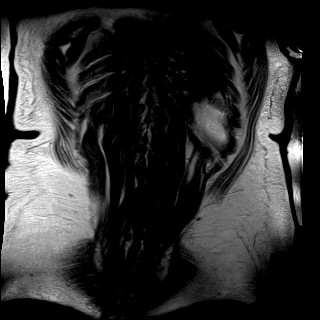

[Series 4: DWI · axial · 6.0mm · 1.68mm/px · z∈[-103,+156]mm · 10 of 37 slices shown (1 of 4)]
[im 1/37]
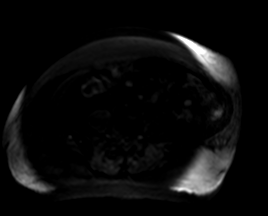
[im 5/37]
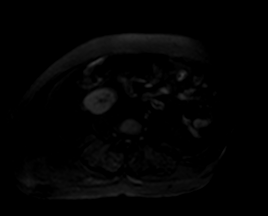
[im 9/37]
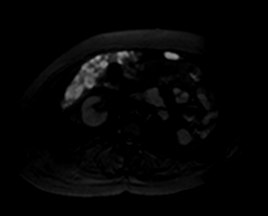
[im 13/37]
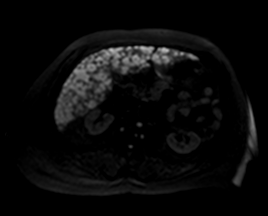
[im 17/37]
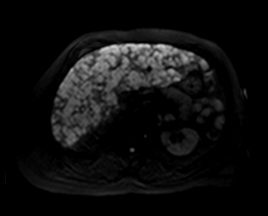
[im 21/37]
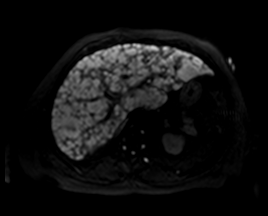
[im 25/37]
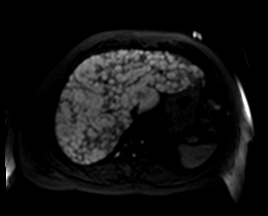
[im 29/37]
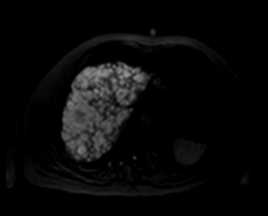
[im 33/37]
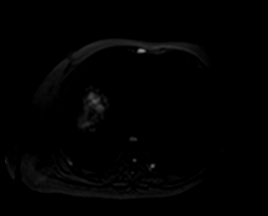
[im 37/37]
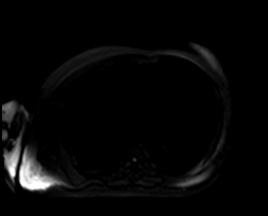

[Series 4: DWI · axial · 6.0mm · 1.68mm/px · z∈[-103,+156]mm · 10 of 37 slices shown (2 of 4)]
[im 1/37]
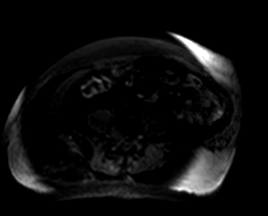
[im 5/37]
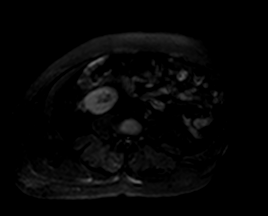
[im 9/37]
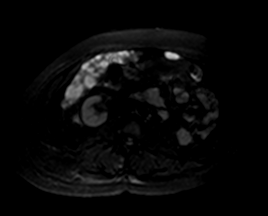
[im 13/37]
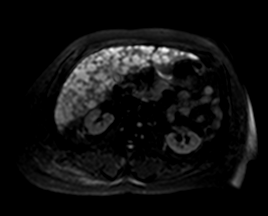
[im 17/37]
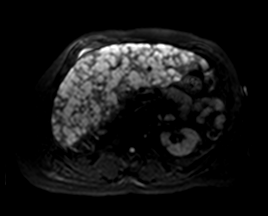
[im 21/37]
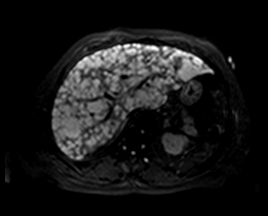
[im 25/37]
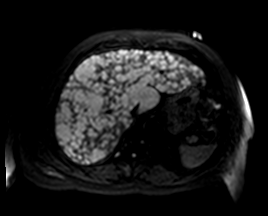
[im 29/37]
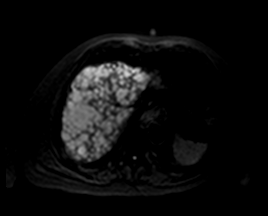
[im 33/37]
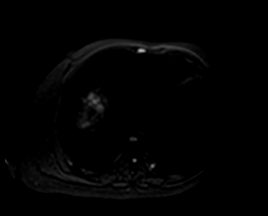
[im 37/37]
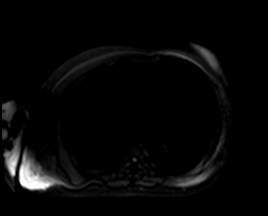

[Series 4: DWI · axial · 6.0mm · 1.68mm/px · z∈[-103,+156]mm · 10 of 37 slices shown (3 of 4)]
[im 1/37]
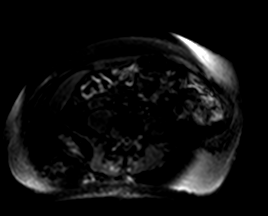
[im 5/37]
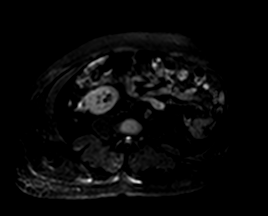
[im 9/37]
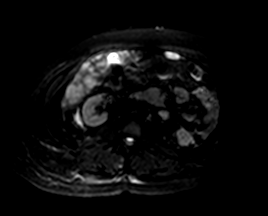
[im 13/37]
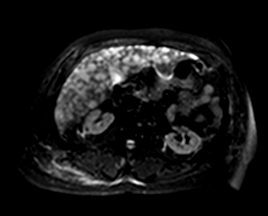
[im 17/37]
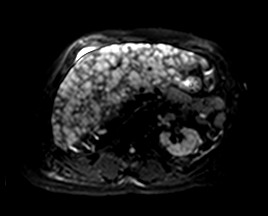
[im 21/37]
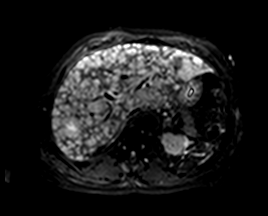
[im 25/37]
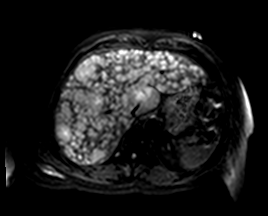
[im 29/37]
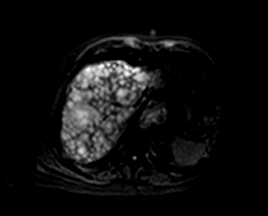
[im 33/37]
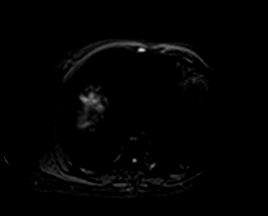
[im 37/37]
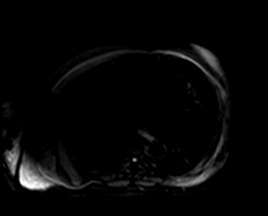

[Series 5: DWI · axial · 6.0mm · 1.68mm/px · z∈[-103,+156]mm · 10 of 37 slices shown (4 of 4)]
[im 1/37]
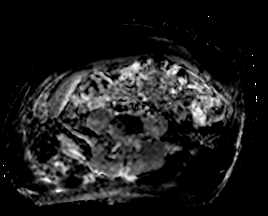
[im 5/37]
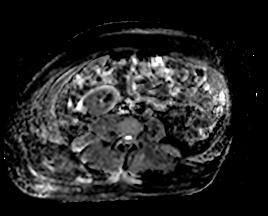
[im 9/37]
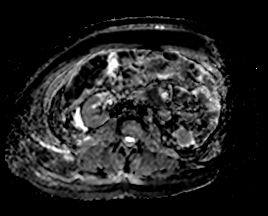
[im 13/37]
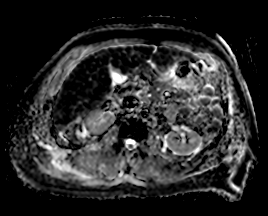
[im 17/37]
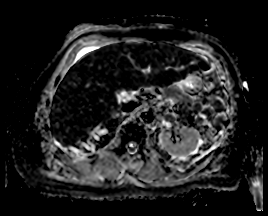
[im 21/37]
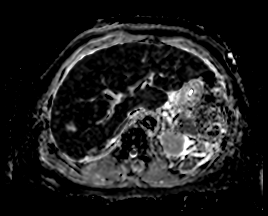
[im 25/37]
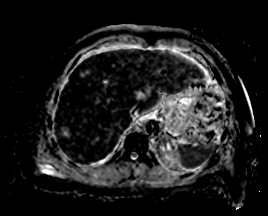
[im 29/37]
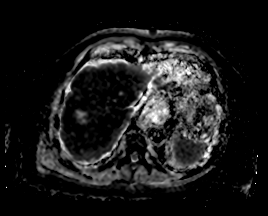
[im 33/37]
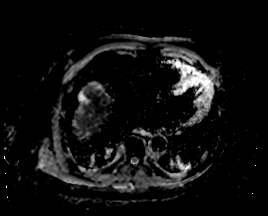
[im 37/37]
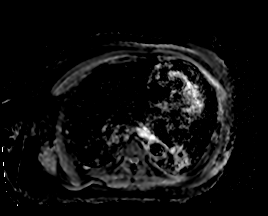

[48 of 48 positions shown; findings below may reference images not displayed]

FINDINGS: Comment: Study is severely limited. Per report from the
technologist, the patient had altered mental status and would not
follow breathing commands, stay still in the scanner, and repeatedly
attempted to pull the coil from his body. The examination was
terminated before acquisition of all pulse sequences had occurred.

Lower chest: Multiple areas of diffusion restriction in the ribs,
sternum and lower thoracic spine.

Hepatobiliary: Innumerable nodular and mass-like areas of diffusion
restriction are noted throughout the liver, most compatible with
widespread metastatic disease. These are poorly evaluated on today's
limited noncontrast examination, but the largest of these is in the
superior aspect of the right lobe of the liver estimated to measure
at least 4.3 cm in diameter, with central T2 hyperintensity, which
may reflect internal areas of necrosis.

Pancreas: Poorly evaluated on today's limited examination, but
grossly unremarkable.

Spleen: Poorly evaluated on today's limited examination, but grossly
unremarkable.

Adrenals/Urinary Tract: Poorly evaluated on today's limited
examination, but grossly unremarkable. No definite
hydroureteronephrosis in the visualized portions of the abdomen.

Stomach/Bowel: Visualized portions are unremarkable.

Vascular/Lymphatic: No aneurysm identified in the visualized
abdominal vasculature. No definite lymphadenopathy noted in the
abdomen.

Other: No significant volume of ascites noted in the visualized
portions of the abdomen.

Musculoskeletal: Numerous small areas of diffusion restriction noted
throughout the visualized lumbar spine and right hemipelvis,
concerning for widespread metastatic disease to the bones.
IMPRESSION: 1. Limited study which demonstrates widespread metastatic disease to
the liver and bones, as above.

## 2020-08-07 IMAGING — CT CT CHEST W/O CM
2 of 4 series · 15 of 36 positions shown, 18 images · non-contrast
Comparison: None

CLINICAL DATA: Cancer of unknown primary.  Liver lesion.

EXAM:
CT CHEST WITHOUT CONTRAST
TECHNIQUE: Multidetector CT imaging of the chest was performed following the
standard protocol without IV contrast.

[Series 2: routine chest without · axial · non-contrast · 0.74mm/px · z∈[+1090,+1370]mm · 12 of 166 slices shown, 15 images]
[im 13/166  mediastinal]
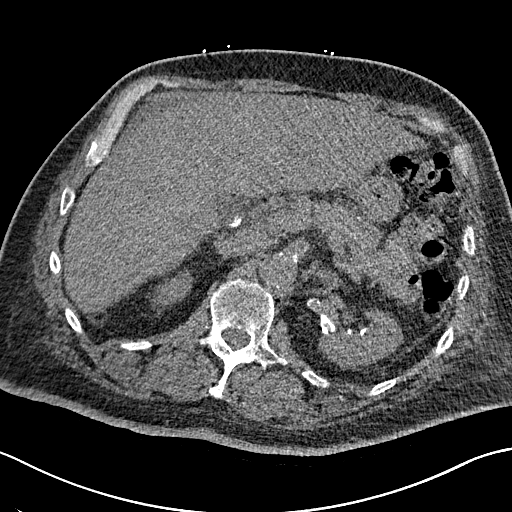
[im 13/166  lung]
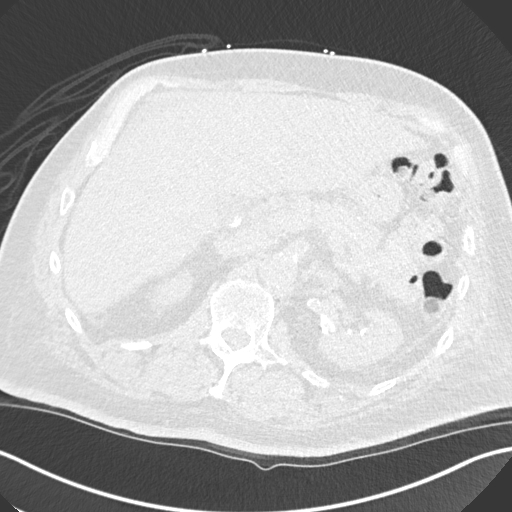
[im 26/166  lung]
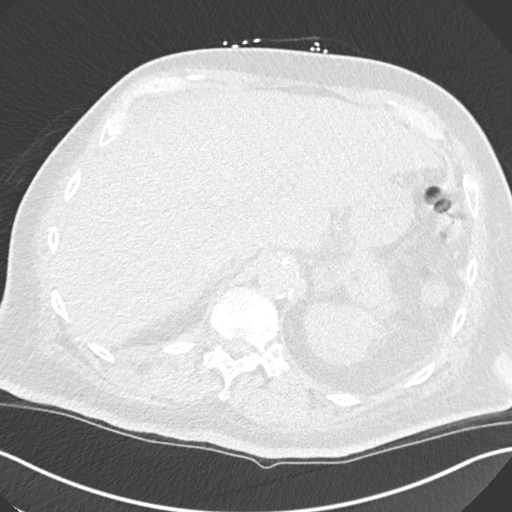
[im 39/166  lung]
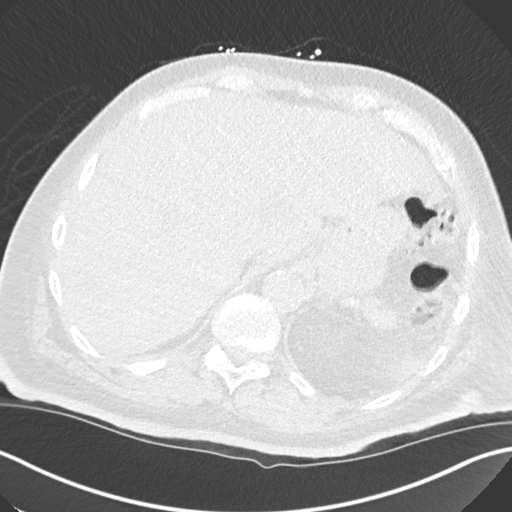
[im 51/166  lung]
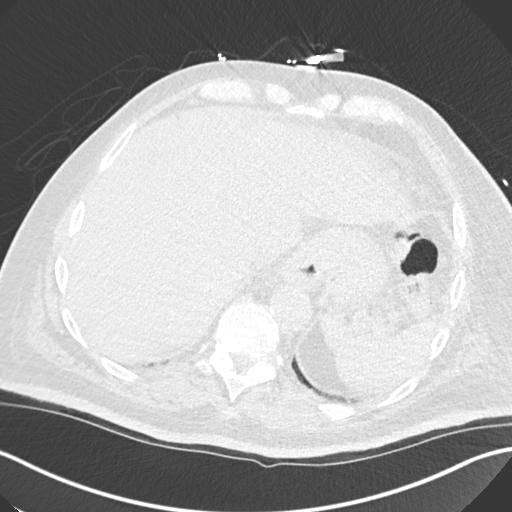
[im 64/166  mediastinal]
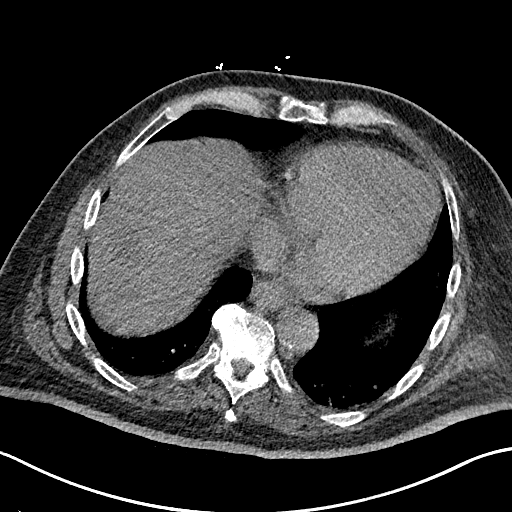
[im 64/166  lung]
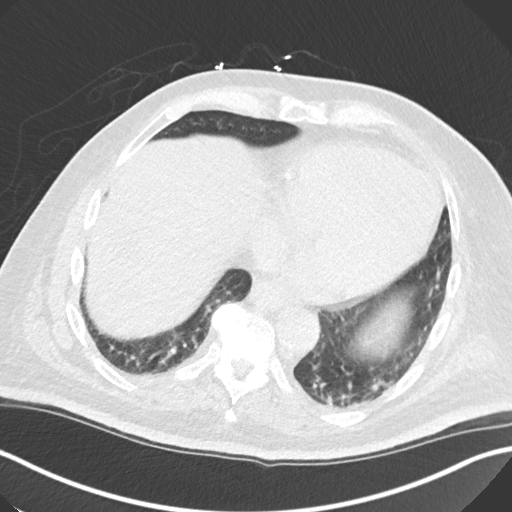
[im 77/166  lung]
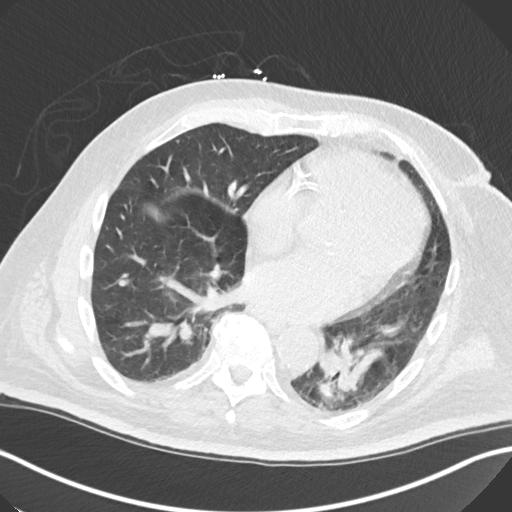
[im 89/166  lung]
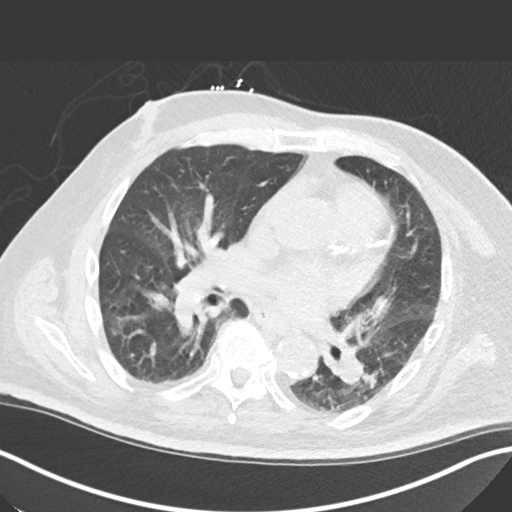
[im 102/166  lung]
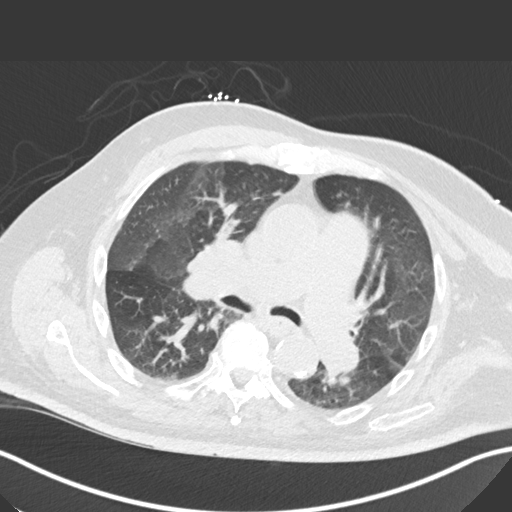
[im 115/166  mediastinal]
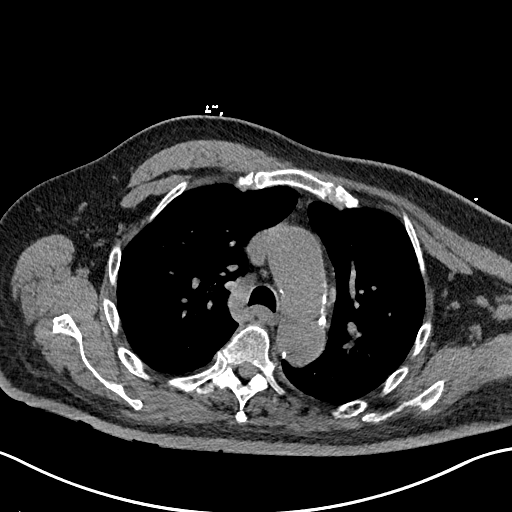
[im 115/166  lung]
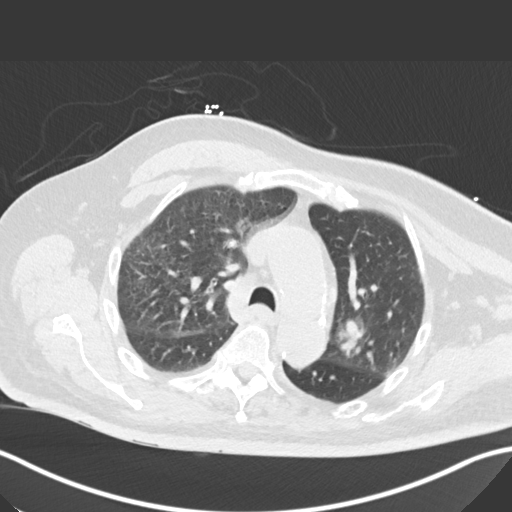
[im 127/166  lung]
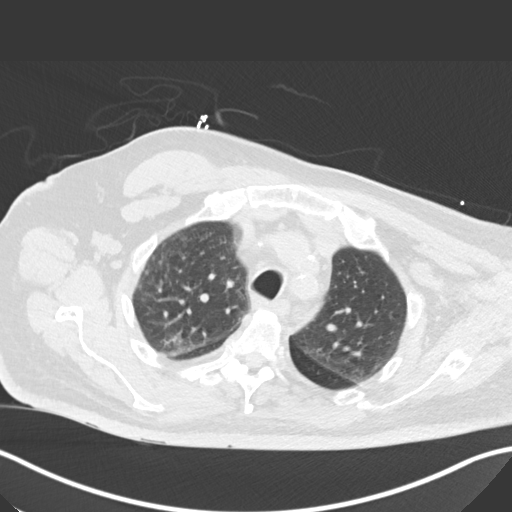
[im 140/166  lung]
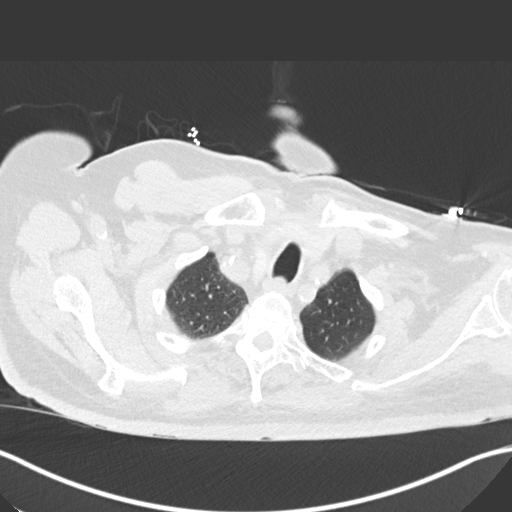
[im 153/166  lung]
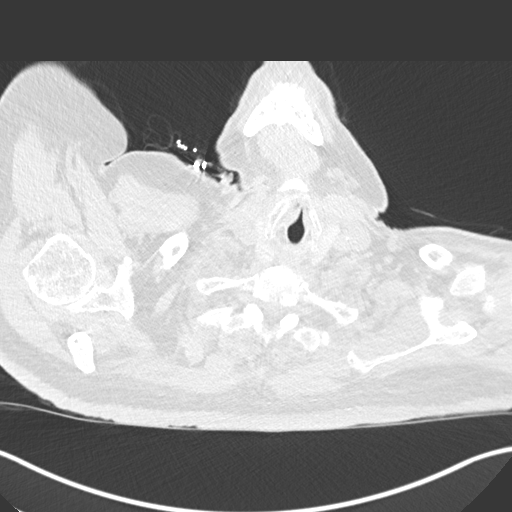

[Series 5: coronal · coronal · 0.65mm/px · 3 of 151 slices shown]
[im 31/151  lung]
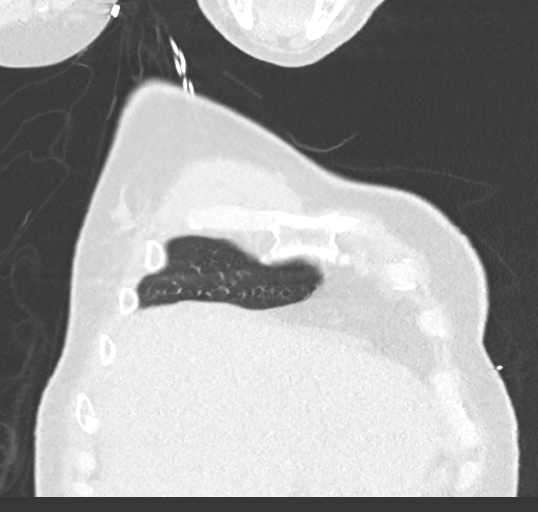
[im 61/151  lung]
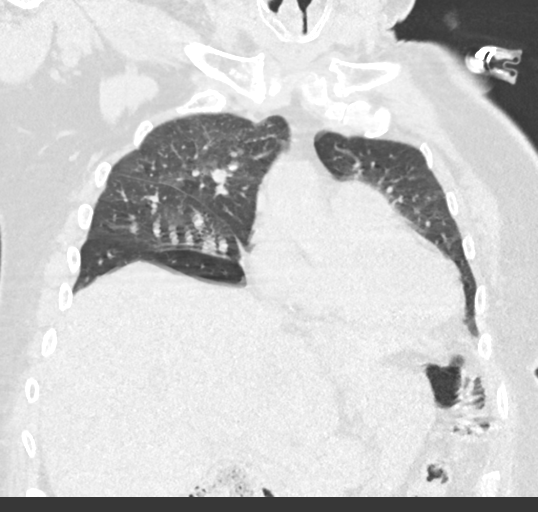
[im 91/151  lung]
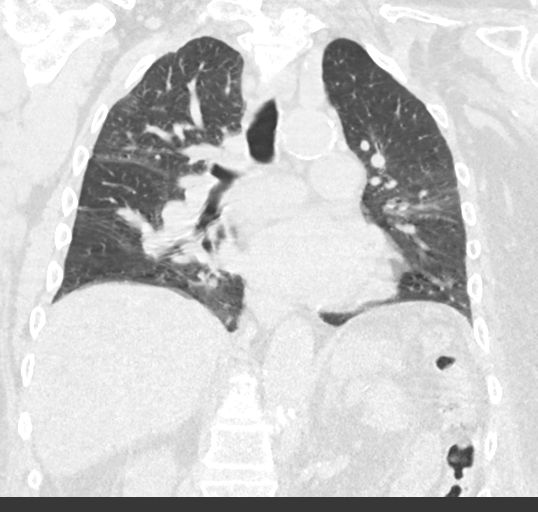

[15 of 36 positions shown; findings below may reference images not displayed]

FINDINGS: Cardiovascular: Normal heart size. No pericardial effusion. Aortic
atherosclerosis and coronary artery calcifications.

Mediastinum/Nodes: Normal appearance of the thyroid gland. The
trachea appears patent and is midline. No supraclavicular or
axillary adenopathy. No mediastinal lymph nodes. The hilar
structures are suboptimally evaluated due to lack of IV contrast
material.

Lungs/Pleura: No pleural effusion, airspace consolidation or
atelectasis. Right upper lobe spiculated nodule measures 1.5 cm,
image 61/4. Right hilar adenopathy versus perihilar lung mass is
suspected measuring approximately 5.2 cm. Adjacent satellite nodule
measures 3 mm, image 63/4. However, due to lack of IV contrast
material this area is indeterminate and incompletely evaluated.

Upper Abdomen: Limited imaging through the upper abdomen again shows
multiple liver lesions compatible with diffuse metastatic disease.
This is better evaluated on MRI from earlier today.

Musculoskeletal: No chest wall mass or suspicious bone lesions
identified.
IMPRESSION: 1. Right upper lobe spiculated nodule is identified measuring
cm. Adjacent satellite nodule measures 3 mm. Suspicious for primary
bronchogenic carcinoma.
2. Right hilar adenopathy versus perihilar lung mass is suspected
measuring approximately 5.2 cm. However, due to lack of IV contrast
material this area is indeterminate and incompletely evaluated.
Recommend repeat imaging following IV contrast administration versus
further investigation with PET-CT.
3. Diffuse liver lesions compatible with diffuse metastatic disease.
This is better evaluated on MRI from earlier today.
4. Aortic atherosclerosis and coronary artery calcifications.

Aortic Atherosclerosis ([KU]-[KU]).

## 2020-08-07 MED ORDER — POTASSIUM CHLORIDE CRYS ER 20 MEQ PO TBCR
20.0000 meq | EXTENDED_RELEASE_TABLET | Freq: Once | ORAL | Status: AC
  Administered 2020-08-07: 20 meq via ORAL
  Filled 2020-08-07: qty 1

## 2020-08-07 MED ORDER — PANTOPRAZOLE SODIUM 40 MG IV SOLR
40.0000 mg | INTRAVENOUS | Status: DC
  Administered 2020-08-07 – 2020-08-08 (×2): 40 mg via INTRAVENOUS
  Filled 2020-08-07 (×2): qty 40

## 2020-08-07 MED ORDER — HYDROMORPHONE HCL 1 MG/ML IJ SOLN
0.5000 mg | INTRAMUSCULAR | Status: DC | PRN
  Administered 2020-08-09: 0.5 mg via INTRAVENOUS
  Filled 2020-08-07: qty 0.5

## 2020-08-07 MED ORDER — LACTATED RINGERS IV SOLN: INTRAVENOUS | Status: AC

## 2020-08-07 MED ORDER — GADOBUTROL 1 MMOL/ML IV SOLN
10.0000 mL | Freq: Once | INTRAVENOUS | Status: AC | PRN
  Administered 2020-08-07: 10 mL via INTRAVENOUS

## 2020-08-07 NOTE — Consult Note (Signed)
[facility] Consultation Oncology  Name: Charles Sherman      MRN: [id number]    Location: A310/A310-01  Date: 08/07/2020 Time:4:54 PM   REFERRING PHYSICIAN: Dr. Jennye Boroughs  REASON FOR CONSULT: Metastatic disease to the liver.    HISTORY OF PRESENT ILLNESS: Charles Sherman is a 75 year old very pleasant African-American male who is seen in consultation today at the request of Dr. Mal Misty.  Patient's wife at bedside.  She provides most of the history.  Patient is in and out of sleep.  She brought him in to the ER as he was feeling very weak.  He also stopped eating about 7 to 10 days ago.  His urine was also very dark.  In the ER on 08/06/2020 total bilirubin was elevated at 14.9 along with elevated AST and ALT.  Creatinine was also elevated at 2.06.  A CT scan of the abdomen pelvis without contrast showed possible nodularity of the liver with multiple ill-defined low-attenuation masses.  15 mm hepatic lesion of the lower pole of the right kidney.  He was evaluated by GI service and MRI of the abdomen without contrast was done today which showed innumerable nodular and masslike areas throughout the liver compatible with widespread metastatic disease.  Largest in the right lobe of the liver measures 4.3 cm.  Numerous small areas of diffusion restriction noted throughout the visualized lumbar spine and right hemipelvis.  He reportedly had stroke with left-sided weakness last year and is mostly confined to a chair.  He ambulates with the help of wheelchair or occasionally walker.  He lives at home with his wife.  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  He was in service in [place] and has agent orange exposure.  Family history significant for stomach cancer in his brother.  PAST MEDICAL HISTORY:   Past Medical History:  Diagnosis Date   CHF (congestive heart failure) (HCC)    COPD (chronic obstructive pulmonary disease) (HCC)    HTN (hypertension)    Myocardial  infarct (HCC)    OSA (obstructive sleep apnea)    on CPAP   Stroke ([place])     ALLERGIES: Allergies  Allergen Reactions   Shrimp [Shellfish Allergy]       MEDICATIONS: I have reviewed the patient's current medications.     PAST SURGICAL HISTORY Past Surgical History:  Procedure Laterality Date   IR ANGIO INTRA EXTRACRAN SEL COM CAROTID INNOMINATE BILAT MOD SED  11/19/2019   IR ANGIO VERTEBRAL SEL VERTEBRAL BILAT MOD SED  11/19/2019   IR CT HEAD LTD  11/21/2019   IR INTRAVSC STENT CERV CAROTID W/O EMB-PROT MOD SED INC ANGIO  11/21/2019   IR US GUIDE VASC ACCESS RIGHT  11/19/2019   PERCUTANEOUS CORONARY STENT INTERVENTION (PCI-S)     RADIOLOGY WITH ANESTHESIA Right 11/21/2019   Procedure: RIGHT CAROTID STENTING;  Surgeon: Luanne Bras, MD;  Location: [place];  Service: Radiology;  Laterality: Right;    FAMILY HISTORY: Family History  Problem Relation Age of Onset   Alzheimer's disease Mother    Heart disease Father    Diabetes Sister    Stomach cancer Brother     SOCIAL HISTORY:  reports that he has quit smoking. His smoking use included cigarettes. He smoked an average of 0.50 packs per day. He has never used smokeless tobacco. He reports previous alcohol use of about 1.0 standard drink of alcohol per week. He reports that he does not use drugs.  PERFORMANCE STATUS:  The patient's performance status is 3 - Symptomatic, >50% confined to bed  PHYSICAL EXAM: Most Recent Vital Signs: Blood pressure (!) 159/82, pulse 75, temperature 98.2 F (36.8 C), temperature source Oral, resp. rate 20, height 5\' 11"  (1.803 m), weight 238 lb 1.6 oz (108 kg), SpO2 97 %. BP (!) 159/82 (BP Location: Left Arm)   Pulse 75   Temp 98.2 F (36.8 C) (Oral)   Resp 20   Ht 5\' 11"  (1.803 m)   Wt 238 lb 1.6 oz (108 kg)   SpO2 97%   BMI 33.21 kg/m  General appearance: alert, cooperative, and appears stated age Head: Normocephalic, without obvious abnormality, atraumatic Eyes:  Conjunctiva are  icteric. Lungs:  Bilateral air entry. Heart: regular rate and rhythm Abdomen:  Soft, mild tenderness in the right upper quadrant.  No palpable masses. Extremities:  Trace edema bilaterally. Neurologic: Awake, in and out to sleep.  Responds to commands appropriately.  LABORATORY DATA:  Results for orders placed or performed during the hospital encounter of 08/06/20 (from the past 48 hour(s))  Comprehensive metabolic panel     Status: Abnormal   Collection Time: 08/06/20  5:33 PM  Result Value Ref Range   Sodium 141 135 - 145 mmol/L   Potassium 3.3 (L) 3.5 - 5.1 mmol/L   Chloride 101 98 - 111 mmol/L   CO2 29 22 - 32 mmol/L   Glucose, Bld 134 (H) 70 - 99 mg/dL    Comment: Glucose reference range applies only to samples taken after fasting for at least 8 hours.   BUN 30 (H) 8 - 23 mg/dL   Creatinine, Ser 2.06 (H) 0.61 - 1.24 mg/dL   Calcium 9.9 8.9 - 10.3 mg/dL   Total Protein 7.4 6.5 - 8.1 g/dL   Albumin 3.1 (L) 3.5 - 5.0 g/dL   AST 265 (H) 15 - 41 U/L   ALT 171 (H) 0 - 44 U/L   Alkaline Phosphatase 199 (H) 38 - 126 U/L   Total Bilirubin 14.9 (H) 0.3 - 1.2 mg/dL   GFR, Estimated 33 (L) >60 mL/min    Comment: (NOTE) Calculated using the CKD-EPI Creatinine Equation (2021)    Anion gap 11 5 - 15    Comment: Performed at [facility], [address], [place], [place] [address]  CBC with Differential     Status: Abnormal   Collection Time: 08/06/20  5:33 PM  Result Value Ref Range   WBC 7.6 4.0 - 10.5 K/uL   RBC 4.40 4.22 - 5.81 MIL/uL   Hemoglobin 14.4 13.0 - 17.0 g/dL   HCT 41.8 39.0 - 52.0 %   MCV 95.0 80.0 - 100.0 fL   MCH 32.7 26.0 - 34.0 pg   MCHC 34.4 30.0 - 36.0 g/dL   RDW 17.1 (H) 11.5 - 15.5 %   Platelets 223 150 - 400 K/uL   nRBC 0.0 0.0 - 0.2 %   Neutrophils Relative % 68 %   Neutro Abs 5.1 1.7 - 7.7 K/uL   Lymphocytes Relative 19 %   Lymphs Abs 1.5 0.7 - 4.0 K/uL   Monocytes Relative 11 %   Monocytes Absolute 0.9 0.1 - 1.0 K/uL   Eosinophils Relative 1 %    Eosinophils Absolute 0.1 0.0 - 0.5 K/uL   Basophils Relative 1 %   Basophils Absolute 0.0 0.0 - 0.1 K/uL   Immature Granulocytes 0 %   Abs Immature Granulocytes 0.03 0.00 - 0.07 K/uL    Comment: Performed at [facility],  [address], [place], [place] [address]  Protime-INR     Status: Abnormal   Collection Time: 08/06/20  7:24 PM  Result Value Ref Range   Prothrombin Time 15.9 (H) 11.4 - 15.2 seconds   INR 1.3 (H) 0.8 - 1.2    Comment: (NOTE) INR goal varies based on device and disease states. Performed at [facility], [address], [place], [place] [address]   Hepatitis panel, acute     Status: None (Preliminary result)   Collection Time: 08/06/20  7:24 PM  Result Value Ref Range   Hepatitis B Surface Ag NON REACTIVE NON REACTIVE    Comment: Performed at [facility] [address] [place], [place] [address]   HCV Ab PENDING NON REACTIVE   Hep A IgM PENDING NON REACTIVE   Hep B C IgM PENDING NON REACTIVE  Acetaminophen level     Status: Abnormal   Collection Time: 08/06/20  7:24 PM  Result Value Ref Range   Acetaminophen (Tylenol), Serum <10 (L) 10 - 30 ug/mL    Comment: (NOTE) Therapeutic concentrations vary significantly. A range of 10-30 ug/mL  may be an effective concentration for many patients. However, some  are best treated at concentrations outside of this range. Acetaminophen concentrations >150 ug/mL at 4 hours after ingestion  and >50 ug/mL at 12 hours after ingestion are often associated with  toxic reactions.  Performed at [facility], [address], [place], [place] [address]   Resp Panel by RT-PCR (Flu A&B, Covid) Nasopharyngeal Swab     Status: None   Collection Time: 08/06/20  8:01 PM   Specimen: Nasopharyngeal Swab; Nasopharyngeal(NP) swabs in vial transport medium  Result Value Ref Range   SARS Coronavirus 2 by RT PCR NEGATIVE NEGATIVE    Comment: (NOTE) SARS-CoV-2 target nucleic acids are NOT DETECTED.  The SARS-CoV-2 RNA is generally  detectable in upper respiratory specimens during the acute phase of infection. The lowest concentration of SARS-CoV-2 viral copies this assay can detect is 138 copies/mL. A negative result does not preclude SARS-Cov-2 infection and should not be used as the sole basis for treatment or other patient management decisions. A negative result may occur with  improper specimen collection/handling, submission of specimen other than nasopharyngeal swab, presence of viral mutation(s) within the areas targeted by this assay, and inadequate number of viral copies(<138 copies/mL). A negative result must be combined with clinical observations, patient history, and epidemiological information. The expected result is Negative.  Fact Sheet for Patients:  EntrepreneurPulse.com.au  Fact Sheet for Healthcare Providers:  IncredibleEmployment.be  This test is no t yet approved or cleared by the [place] FDA and  has been authorized for detection and/or diagnosis of SARS-CoV-2 by FDA under an Emergency Use Authorization (EUA). This EUA will remain  in effect (meaning this test can be used) for the duration of the COVID-19 declaration under Section 564(b)(1) of the Act, 21 U.S.C.section 360bbb-3(b)(1), unless the authorization is terminated  or revoked sooner.       Influenza A by PCR NEGATIVE NEGATIVE   Influenza B by PCR NEGATIVE NEGATIVE    Comment: (NOTE) The Xpert Xpress SARS-CoV-2/FLU/RSV plus assay is intended as an aid in the diagnosis of influenza from Nasopharyngeal swab specimens and should not be used as a sole basis for treatment. Nasal washings and aspirates are unacceptable for Xpert Xpress SARS-CoV-2/FLU/RSV testing.  Fact Sheet for Patients: EntrepreneurPulse.com.au  Fact Sheet for Healthcare Providers: IncredibleEmployment.be  This test is not yet approved or  cleared by the [place] and has  been authorized for detection and/or diagnosis of SARS-CoV-2 by FDA under an Emergency Use Authorization (EUA). This EUA will remain in effect (meaning this test can be used) for the duration of the COVID-19 declaration under Section 564(b)(1) of the Act, 21 U.S.C. section 360bbb-3(b)(1), unless the authorization is terminated or revoked.  Performed at [facility], [address] [place], [place] [address]   Ammonia     Status: None   Collection Time: 08/06/20  8:18 PM  Result Value Ref Range   Ammonia 33 9 - 35 umol/L    Comment: Performed at [facility], [address] [place], [place] [address]  Urinalysis, Routine w reflex microscopic Urine, Clean Catch     Status: Abnormal   Collection Time: 08/06/20  8:47 PM  Result Value Ref Range   Color, Urine AMBER (A) YELLOW    Comment: BIOCHEMICALS MAY BE AFFECTED BY COLOR   APPearance HAZY (A) CLEAR   Specific Gravity, Urine 1.016 1.005 - 1.030   pH 5.0 5.0 - 8.0   Glucose, UA NEGATIVE NEGATIVE mg/dL   Hgb urine dipstick NEGATIVE NEGATIVE   Bilirubin Urine SMALL (A) NEGATIVE   Ketones, ur NEGATIVE NEGATIVE mg/dL   Protein, ur NEGATIVE NEGATIVE mg/dL   Nitrite NEGATIVE NEGATIVE   Leukocytes,Ua NEGATIVE NEGATIVE    Comment: Performed at [facility], [address] [place], [place] [address]  Comprehensive metabolic panel     Status: Abnormal   Collection Time: 08/07/20  6:36 AM  Result Value Ref Range   Sodium 140 135 - 145 mmol/L   Potassium 3.8 3.5 - 5.1 mmol/L   Chloride 103 98 - 111 mmol/L   CO2 28 22 - 32 mmol/L   Glucose, Bld 100 (H) 70 - 99 mg/dL    Comment: Glucose reference range applies only to samples taken after fasting for at least 8 hours.   BUN 31 (H) 8 - 23 mg/dL   Creatinine, Ser 1.97 (H) 0.61 - 1.24 mg/dL   Calcium 9.8 8.9 - 10.3 mg/dL   Total Protein 6.6 6.5 - 8.1 g/dL   Albumin 2.9 (L) 3.5 - 5.0 g/dL   AST 228 (H) 15 - 41 U/L   ALT 151 (H) 0 - 44 U/L   Alkaline Phosphatase 181 (H) 38 - 126 U/L   Total  Bilirubin 14.6 (H) 0.3 - 1.2 mg/dL   GFR, Estimated 35 (L) >60 mL/min    Comment: (NOTE) Calculated using the CKD-EPI Creatinine Equation (2021)    Anion gap 9 5 - 15    Comment: Performed at [facility], [address] [place], [place] [address]  CBC     Status: Abnormal   Collection Time: 08/07/20  6:36 AM  Result Value Ref Range   WBC 8.7 4.0 - 10.5 K/uL   RBC 4.31 4.22 - 5.81 MIL/uL   Hemoglobin 14.1 13.0 - 17.0 g/dL   HCT 40.3 39.0 - 52.0 %   MCV 93.5 80.0 - 100.0 fL   MCH 32.7 26.0 - 34.0 pg   MCHC 35.0 30.0 - 36.0 g/dL   RDW 17.1 (H) 11.5 - 15.5 %   Platelets 171 150 - 400 K/uL   nRBC 0.0 0.0 - 0.2 %    Comment: Performed at [facility], [address] [place], [place] [address]  Protime-INR     Status: Abnormal   Collection Time: 08/07/20  6:36 AM  Result Value Ref Range   Prothrombin Time 15.4 (  H) 11.4 - 15.2 seconds   INR 1.2 0.8 - 1.2    Comment: (NOTE) INR goal varies based on device and disease states. Performed at [facility], [address], [place], [place] [address]   APTT     Status: None   Collection Time: 08/07/20  6:36 AM  Result Value Ref Range   aPTT 25 24 - 36 seconds    Comment: Performed at [facility], [address] [place], [place] [address]  Magnesium     Status: Abnormal   Collection Time: 08/07/20  6:36 AM  Result Value Ref Range   Magnesium 2.5 (H) 1.7 - 2.4 mg/dL    Comment: Performed at [facility], [address], [place], [place] [address]  Phosphorus     Status: None   Collection Time: 08/07/20  6:36 AM  Result Value Ref Range   Phosphorus 3.3 2.5 - 4.6 mg/dL    Comment: Performed at [facility], [address] [place], [place] [address]      RADIOGRAPHY: CT ABDOMEN PELVIS WO CONTRAST  Result Date: 08/06/2020 CLINICAL DATA:  Dysuria hematuria EXAM: CT ABDOMEN AND PELVIS WITHOUT CONTRAST TECHNIQUE: Multidetector CT imaging of the abdomen and pelvis was performed following the standard protocol without IV contrast. COMPARISON:   None FINDINGS: Lower chest: Lung bases demonstrate no acute consolidation or effusion. Coronary vascular calcification. Hepatobiliary: Hepatic steatosis. Possible contour nodularity of the liver as may be seen with cirrhosis. Multiple ill-defined low-attenuation masses within the liver, for example 2.5 cm hypodense lesion in the right hepatic lobe series 6, image 8. No calcified gallstone. No biliary dilatation Pancreas: Unremarkable. No pancreatic ductal dilatation or surrounding inflammatory changes. Spleen: Normal in size without focal abnormality. Adrenals/Urinary Tract: Right adrenal gland is normal. Diffusely thickened left adrenal gland without well-defined mass. Kidneys show no hydronephrosis. Intrarenal vascular calcifications on the left. Probable cyst midpole right kidney. 15 mm exophytic slightly dense lesion off the lower pole right kidney, series 6, image 41. Urinary bladder unremarkable Stomach/Bowel: The stomach is nonenlarged. There is no dilated small bowel. There is diffuse diverticular disease of the colon without acute wall thickening. Negative appendix. Vascular/Lymphatic: Advanced aortic atherosclerosis without aneurysm. No suspicious nodes. Reproductive: Prostate calcification without obvious mass Other: Negative for free air. Small amount of free fluid in the pelvis and adjacent to the liver. Musculoskeletal: No acute or suspicious osseous abnormality. IMPRESSION: 1. No CT evidence for acute intra-abdominal or pelvic abnormality. No hydronephrosis or ureteral stone. 2. Hepatic steatosis with possible contour nodularity/cirrhosis. Multiple ill-defined hypodense liver lesions concerning for metastatic disease or primary liver neoplasm. Trace ascites. When the patient is clinically stable and able to follow directions and hold their breath (preferably as an outpatient) further evaluation with dedicated abdominal MRI should be considered. 3. 15 mm indeterminate exophytic lesion off the lower  pole right kidney, could also be evaluated at follow-up MRI. 4. Diverticular disease of the colon without acute wall thickening Electronically Signed   By: Donavan Foil M.D.   On: 08/06/2020 20:08   MR ABDOMEN WO CONTRAST  Result Date: 08/07/2020 CLINICAL DATA:  75 year old male with history of abnormal CT examination concerning for metastatic disease to the liver. Follow-up study. EXAM: MRI ABDOMEN WITHOUT CONTRAST TECHNIQUE: Multiplanar multisequence MR imaging was performed without the administration of intravenous contrast. COMPARISON:  No prior abdominal MRI. CT the abdomen and pelvis 08/06/2020. FINDINGS: Comment: Study is severely limited. Per report from the technologist, the patient had altered mental status and would not follow breathing  commands, stay still in the scanner, and repeatedly attempted to pull the coil from his body. The examination was terminated before acquisition of all pulse sequences had occurred. Lower chest: Multiple areas of diffusion restriction in the ribs, sternum and lower thoracic spine. Hepatobiliary: Innumerable nodular and mass-like areas of diffusion restriction are noted throughout the liver, most compatible with widespread metastatic disease. These are poorly evaluated on today's limited noncontrast examination, but the largest of these is in the superior aspect of the right lobe of the liver estimated to measure at least 4.3 cm in diameter, with central T2 hyperintensity, which may reflect internal areas of necrosis. Pancreas: Poorly evaluated on today's limited examination, but grossly unremarkable. Spleen: Poorly evaluated on today's limited examination, but grossly unremarkable. Adrenals/Urinary Tract: Poorly evaluated on today's limited examination, but grossly unremarkable. No definite hydroureteronephrosis in the visualized portions of the abdomen. Stomach/Bowel: Visualized portions are unremarkable. Vascular/Lymphatic: No aneurysm identified in the visualized  abdominal vasculature. No definite lymphadenopathy noted in the abdomen. Other: No significant volume of ascites noted in the visualized portions of the abdomen. Musculoskeletal: Numerous small areas of diffusion restriction noted throughout the visualized lumbar spine and right hemipelvis, concerning for widespread metastatic disease to the bones. IMPRESSION: 1. 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 Electronically Signed   By: Vinnie Langton M.D.   On: 08/07/2020 09:23       PATHOLOGY:   Pending biopsy  ASSESSMENT:  1.  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 - CTAP without contrast on 08/06/2020 showed multiple ill-defined lesions concerning for metastatic disease. - MRI of the abdomen without contrast on 08/07/2020 showed innumerable nodular and masslike areas of diffusion restriction noted throughout the liver compatible with widespread metastatic disease.  Largest of those in the superior aspect of the right lobe of the liver estimated to measure 4.3 cm. - Labs show total bilirubin between 14-15.  Creatinine also elevated around 2.  2.  Social/family history: - He lives at home with his wife.  He served in [place] War, exposure to agent orange. 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  He ambulates very rarely with help of a walker and another person. - He smoked 1 pack/day for 60 years, quit last year. - Family history significant for brother with "stomach cancer".  PLAN:  1.  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 - Based on his performance status from his stroke.,  Hyperbilirubinemia and renal dysfunction, he is unlikely a candidate for any active treatments.  I  have also discussed comfort care measures. - However patient's wife would like to know the type of malignancy.  Hence we will arrange for ultrasound-guided biopsy of the liver lesions by IR. - Given his smoking history, agree with CT scan of the chest to complete work-up. - We will follow-up after the biopsy.  All questions were answered. The patient knows to call the clinic with any problems, questions or concerns. We can certainly see the patient much sooner if necessary.   Derek Jack

## 2020-08-07 NOTE — Progress Notes (Signed)
[facility] called and transporation arranged for patient to arrive at [facility].  [facility] stated that they would try to pick up patient at approximately 0915 am.

## 2020-08-07 NOTE — Progress Notes (Signed)
[place] notification completed. [phone]

## 2020-08-07 NOTE — Progress Notes (Signed)
Progress Note    Charles Sherman  LYY:[id number] DOB: 12-05-1945  DOA: 08/06/2020 PCP: Jacqlyn Larsen, MD      Brief Narrative:    Medical records reviewed and are as summarized below:  Charles Sherman is a 75 y.o. male with past medical history significant for hypertension, hyperlipidemia, CHF, CAD, CAD, who presented to the hospital because of dark urine and lower abdominal pain.  He was found to have elevated liver enzymes with significant hyperbilirubinemia, multiple liver lesions concerning for metastatic disease and lesions in the lumbar spine and right hemipelvis concerning for metastatic disease.  He also has AKI on CKD stage IIIa.    Assessment/Plan:   Principal Problem:   Hyperbilirubinemia Active Problems:   Chronic diastolic CHF (congestive heart failure) (HCC)   Essential hypertension   AKI (acute kidney injury) (HCC)   Hepatic steatosis   Hypokalemia   Transaminitis   Obesity (BMI 30.0-34.9)   GERD (gastroesophageal reflux disease)   CAD (coronary artery disease)    Body mass index is 30.27 kg/m.  (Obesity)   Multiple liver lesions concerning for metastatic disease, hyperbilirubinemia/elevated liver enzymes: AFP tumor marker is pending.  Obtain CT chest without contrast to help with staging.  Unfortunately, IV contrast cannot be used because of CKD. Consult oncology. IR to consider biopsy liver.  Numerous spots in the lumbar spine and right hemipelvis concerning for widespread metastatic disease  AKI on CKD stage IIIa: Treat with IV fluids.  Monitor BMP.  Baseline creatinine around 1.2-1.4.  Hypokalemia: Replete potassium.  Other comorbidities include CAD, COPD, OSA on CPAP, hypertension, GERD.   Diet Order             Diet NPO time specified  Diet effective midnight                      Consultants: Gastroenterologist  Procedures: None    Medications:    pantoprazole (PROTONIX) IV  40 mg Intravenous Q24H    Continuous Infusions:   Anti-infectives (From admission, onward)    None              Family Communication/Anticipated D/C date and plan/Code Status   DVT prophylaxis: SCDs Start: 08/06/20 2234     Code Status: Full Code  Family Communication: None Disposition Plan:    Status is: Inpatient  Remains inpatient appropriate because:Inpatient level of care appropriate due to severity of illness  Dispo: The patient is from: Home              Anticipated d/c is to: Home              Patient currently is not medically stable to d/c.   Difficult to place patient No           Subjective:   Interval events noted.  He complains of dark urine.  Objective:    Vitals:   08/07/20 0610 08/07/20 0615 08/07/20 0630 08/07/20 0900  BP: (!) 111/50  117/66 137/66  Pulse: (!) 56   (!) 57  Resp: (!) 24  19 18   Temp:      TempSrc:      SpO2: 97% 95%  98%  Weight:      Height:       No data found.   Intake/Output Summary (Last 24 hours) at 08/07/2020 1207 Last data filed at 08/07/2020 0139 Gross per 24 hour  Intake 500 ml  Output --  Net 500 ml  Filed Weights   08/06/20 1505  Weight: 98.4 kg    Exam:  GEN: NAD SKIN: Warm and dry. Jaundice EYES: EOMI, icteric but no pallor ENT: MMM CV: RRR PULM: CTA B ABD: soft, obese, NT, +BS CNS: AAO x 1 (person), non focal EXT: No edema or tenderness        Data Reviewed:   I have personally reviewed following labs and imaging studies:  Labs: Labs show the following:   Basic Metabolic Panel: Recent Labs  Lab 08/01/20 1434 08/06/20 1733 08/07/20 0636  NA 139 141 140  K 3.1* 3.3* 3.8  CL 99 101 103  CO2 29 29 28   GLUCOSE 133* 134* 100*  BUN 36* 30* 31*  CREATININE 2.21* 2.06* 1.97*  CALCIUM 9.6 9.9 9.8  MG  --   --  2.5*  PHOS  --   --  3.3   GFR Estimated Creatinine Clearance: 38.1 mL/min (A) (by C-G formula based on SCr of 1.97 mg/dL (H)). Liver Function Tests: Recent Labs  Lab  08/06/20 1733 08/07/20 0636  AST 265* 228*  ALT 171* 151*  ALKPHOS 199* 181*  BILITOT 14.9* 14.6*  PROT 7.4 6.6  ALBUMIN 3.1* 2.9*   No results for input(s): LIPASE, AMYLASE in the last 168 hours. Recent Labs  Lab 08/06/20 2018  AMMONIA 33   Coagulation profile Recent Labs  Lab 08/06/20 1924 08/07/20 0636  INR 1.3* 1.2    CBC: Recent Labs  Lab 08/01/20 1434 08/06/20 1733 08/07/20 0636  WBC 6.9 7.6 8.7  NEUTROABS  --  5.1  --   HGB 13.7 14.4 14.1  HCT 40.3 41.8 40.3  MCV 94.6 95.0 93.5  PLT 212 223 171   Cardiac Enzymes: No results for input(s): CKTOTAL, CKMB, CKMBINDEX, TROPONINI in the last 168 hours. BNP (last 3 results) No results for input(s): PROBNP in the last 8760 hours. CBG: No results for input(s): GLUCAP in the last 168 hours. D-Dimer: No results for input(s): DDIMER in the last 72 hours. Hgb A1c: No results for input(s): HGBA1C in the last 72 hours. Lipid Profile: No results for input(s): CHOL, HDL, LDLCALC, TRIG, CHOLHDL, LDLDIRECT in the last 72 hours. Thyroid function studies: No results for input(s): TSH, T4TOTAL, T3FREE, THYROIDAB in the last 72 hours.  Invalid input(s): FREET3 Anemia work up: No results for input(s): VITAMINB12, FOLATE, FERRITIN, TIBC, IRON, RETICCTPCT in the last 72 hours. Sepsis Labs: Recent Labs  Lab 08/01/20 1434 08/06/20 1733 08/07/20 0636  WBC 6.9 7.6 8.7    Microbiology Recent Results (from the past 240 hour(s))  Resp Panel by RT-PCR (Flu A&B, Covid) Nasopharyngeal Swab     Status: None   Collection Time: 08/06/20  8:01 PM   Specimen: Nasopharyngeal Swab; Nasopharyngeal(NP) swabs in vial transport medium  Result Value Ref Range Status   SARS Coronavirus 2 by RT PCR NEGATIVE NEGATIVE Final    Comment: (NOTE) SARS-CoV-2 target nucleic acids are NOT DETECTED.  The SARS-CoV-2 RNA is generally detectable in upper respiratory specimens during the acute phase of infection. The lowest concentration of  SARS-CoV-2 viral copies this assay can detect is 138 copies/mL. A negative result does not preclude SARS-Cov-2 infection and should not be used as the sole basis for treatment or other patient management decisions. A negative result may occur with  improper specimen collection/handling, submission of specimen other than nasopharyngeal swab, presence of viral mutation(s) within the areas targeted by this assay, and inadequate number of viral copies(<138 copies/mL). A negative result must be  combined with clinical observations, patient history, and epidemiological information. The expected result is Negative.  Fact Sheet for Patients:  EntrepreneurPulse.com.au  Fact Sheet for Healthcare Providers:  IncredibleEmployment.be  This test is no t yet approved or cleared by the [place] FDA and  has been authorized for detection and/or diagnosis of SARS-CoV-2 by FDA under an Emergency Use Authorization (EUA). This EUA will remain  in effect (meaning this test can be used) for the duration of the COVID-19 declaration under Section 564(b)(1) of the Act, 21 U.S.C.section 360bbb-3(b)(1), unless the authorization is terminated  or revoked sooner.       Influenza A by PCR NEGATIVE NEGATIVE Final   Influenza B by PCR NEGATIVE NEGATIVE Final    Comment: (NOTE) The Xpert Xpress SARS-CoV-2/FLU/RSV plus assay is intended as an aid in the diagnosis of influenza from Nasopharyngeal swab specimens and should not be used as a sole basis for treatment. Nasal washings and aspirates are unacceptable for Xpert Xpress SARS-CoV-2/FLU/RSV testing.  Fact Sheet for Patients: EntrepreneurPulse.com.au  Fact Sheet for Healthcare Providers: IncredibleEmployment.be  This test is not yet approved or cleared by the [place] FDA and has been authorized for detection and/or diagnosis of SARS-CoV-2 by FDA under an Emergency Use  Authorization (EUA). This EUA will remain in effect (meaning this test can be used) for the duration of the COVID-19 declaration under Section 564(b)(1) of the Act, 21 U.S.C. section 360bbb-3(b)(1), unless the authorization is terminated or revoked.  Performed at [facility], [address] [place], [place] [address]     Procedures and diagnostic studies:  CT ABDOMEN PELVIS WO CONTRAST  Result Date: 08/06/2020 CLINICAL DATA:  Dysuria hematuria EXAM: CT ABDOMEN AND PELVIS WITHOUT CONTRAST TECHNIQUE: Multidetector CT imaging of the abdomen and pelvis was performed following the standard protocol without IV contrast. COMPARISON:  None FINDINGS: Lower chest: Lung bases demonstrate no acute consolidation or effusion. Coronary vascular calcification. Hepatobiliary: Hepatic steatosis. Possible contour nodularity of the liver as may be seen with cirrhosis. Multiple ill-defined low-attenuation masses within the liver, for example 2.5 cm hypodense lesion in the right hepatic lobe series 6, image 8. No calcified gallstone. No biliary dilatation Pancreas: Unremarkable. No pancreatic ductal dilatation or surrounding inflammatory changes. Spleen: Normal in size without focal abnormality. Adrenals/Urinary Tract: Right adrenal gland is normal. Diffusely thickened left adrenal gland without well-defined mass. Kidneys show no hydronephrosis. Intrarenal vascular calcifications on the left. Probable cyst midpole right kidney. 15 mm exophytic slightly dense lesion off the lower pole right kidney, series 6, image 41. Urinary bladder unremarkable Stomach/Bowel: The stomach is nonenlarged. There is no dilated small bowel. There is diffuse diverticular disease of the colon without acute wall thickening. Negative appendix. Vascular/Lymphatic: Advanced aortic atherosclerosis without aneurysm. No suspicious nodes. Reproductive: Prostate calcification without obvious mass Other: Negative for free air. Small amount of free fluid  in the pelvis and adjacent to the liver. Musculoskeletal: No acute or suspicious osseous abnormality. IMPRESSION: 1. No CT evidence for acute intra-abdominal or pelvic abnormality. No hydronephrosis or ureteral stone. 2. Hepatic steatosis with possible contour nodularity/cirrhosis. Multiple ill-defined hypodense liver lesions concerning for metastatic disease or primary liver neoplasm. Trace ascites. When the patient is clinically stable and able to follow directions and hold their breath (preferably as an outpatient) further evaluation with dedicated abdominal MRI should be considered. 3. 15 mm indeterminate exophytic lesion off the lower pole right kidney, could also be evaluated at follow-up MRI. 4. Diverticular disease of the colon without acute wall thickening Electronically  Signed   By: Donavan Foil M.D.   On: 08/06/2020 20:08   MR ABDOMEN WO CONTRAST  Result Date: 08/07/2020 CLINICAL DATA:  75 year old male with history of abnormal CT examination concerning for metastatic disease to the liver. Follow-up study. EXAM: MRI ABDOMEN WITHOUT CONTRAST TECHNIQUE: Multiplanar multisequence MR imaging was performed without the administration of intravenous contrast. COMPARISON:  No prior abdominal MRI. CT the abdomen and pelvis 08/06/2020. FINDINGS: Comment: Study is severely limited. Per report from the technologist, the patient had altered mental status and would not follow breathing commands, stay still in the scanner, and repeatedly attempted to pull the coil from his body. The examination was terminated before acquisition of all pulse sequences had occurred. Lower chest: Multiple areas of diffusion restriction in the ribs, sternum and lower thoracic spine. Hepatobiliary: Innumerable nodular and mass-like areas of diffusion restriction are noted throughout the liver, most compatible with widespread metastatic disease. These are poorly evaluated on today's limited noncontrast examination, but the largest of  these is in the superior aspect of the right lobe of the liver estimated to measure at least 4.3 cm in diameter, with central T2 hyperintensity, which may reflect internal areas of necrosis. Pancreas: Poorly evaluated on today's limited examination, but grossly unremarkable. Spleen: Poorly evaluated on today's limited examination, but grossly unremarkable. Adrenals/Urinary Tract: Poorly evaluated on today's limited examination, but grossly unremarkable. No definite hydroureteronephrosis in the visualized portions of the abdomen. Stomach/Bowel: Visualized portions are unremarkable. Vascular/Lymphatic: No aneurysm identified in the visualized abdominal vasculature. No definite lymphadenopathy noted in the abdomen. Other: No significant volume of ascites noted in the visualized portions of the abdomen. Musculoskeletal: Numerous small areas of diffusion restriction noted throughout the visualized lumbar spine and right hemipelvis, concerning for widespread metastatic disease to the bones. IMPRESSION: 1. 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 Electronically Signed   By: Vinnie Langton M.D.   On: 08/07/2020 09:23               LOS: 1 day   Daiana Vitiello  [facility]   Pager on [email or web address]. If 7PM-7AM, please contact night-coverage at [email or web address]     08/07/2020, 12:07 PM

## 2020-08-07 NOTE — ED Notes (Signed)
Report received from [place], [place]

## 2020-08-07 NOTE — Consult Note (Addendum)
Referring Provider: Jennye Boroughs, MD Primary Care Physician:  Jacqlyn Larsen, MD Primary Gastroenterologist:  formerly unassigned, Elon Alas. Abbey Chatters, DO   Reason for Consultation: Hyperbilirubinemia and transaminitis  HPI: Charles Sherman is a 75 y.o. 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  He tells me today started having some pain across his entire chest for about 2 weeks.  Not mentioned in previous notes by ED and admitting providers.  In the ED: Potassium 3.3, creatinine 2.06 (up from baseline of 1.35), total bilirubin 14.9, alkaline phosphatase 199, AST 265, ALT 171, white blood cell count 7600, hemoglobin 14.4, platelets 223,000, INR 1.3, acetaminophen level less than 10, SARS coronavirus 2 negative.  CT abdomen pelvis without contrast yesterday showed hepatic steatosis, possible contour nodularity of the liver which can be seen with cirrhosis.  Multiple ill-defined low-attenuation masses within the liver concerning for metastatic disease or primary liver neoplasm.  No calcified gallstones or biliary dilatation.  Pancreas unremarkable.  15 mm indeterminate exophytic lesion off the lower pole right kidney, could be evaluated at follow-up MRI.  Today: Total bilirubin 14.6, alkaline phosphatase 181, AST 228, ALT 151, creatinine 1.97.  Patient completed MRI abdomen without contrast today.  Study severely limited due to patient's altered mental status, would not follow breathing commands or stay still.  Examination terminated before acquisition of all pulse sequences.  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  Pancreas poorly evaluated.  Adrenal glands and urinary tract poorly evaluated.  No significant volume of ascites.  Patient reports 60 pound weight loss  over the past 1 year.  States it has been unintentional.  Appetite has been poor.  He denies any dysphagia since his stroke.  No heartburn. States he vomited in the ed. Complains of diffuse chest pain for a couple of weeks.  No associated diaphoresis or shortness of breath.  Very limited mobility at home.  Bowel movements are "slow".  No melena or rectal bleeding.  Usually his BMs have been regular.  Over the past week or more his urine has been dark in color and malodorous.  He was concerned there may be blood in the urine or infection.  Patient denies personal history of cancer.  No skin cancers.  No EGD or colonoscopy on file.   Prior to Admission medications   Medication Sig Start Date End Date Taking? Authorizing Provider  acetaminophen (TYLENOL) 325 MG tablet Take 2 tablets (650 mg total) by mouth every 4 (four) hours as needed for mild pain (or temp > 37.5 C (99.5 F)). 12/19/19   Angiulli, Lavon Paganini, PA-C  albuterol (VENTOLIN HFA) 108 (90 Base) MCG/ACT inhaler Inhale 2 puffs into the lungs every 6 (six) hours as needed for wheezing or shortness of breath.    [provider]  amLODipine (NORVASC) 10 MG tablet Take 10 mg by mouth daily.     [provider]  aspirin EC 81 MG tablet Take 81 mg by mouth daily.    [provider]  atorvastatin (LIPITOR) 80 MG tablet Take 1 tablet (80 mg total) by mouth daily at 6 PM. 12/19/19   Angiulli, Lavon Paganini, PA-C  docusate sodium (COLACE) 100 MG capsule Take 100 mg by mouth daily as needed for mild constipation.    [provider]  furosemide (LASIX) 20 MG tablet Take 1 tablet (20 mg  total) by mouth daily. 08/04/20 11/02/20  Strader, Fransisco Hertz, PA-C  isosorbide mononitrate (IMDUR) 120 MG 24 hr tablet Take 120 mg by mouth daily. Patient not taking: Reported on 08/01/2020    [provider]  metolazone (ZAROXOLYN) 2.5 MG tablet Take 1 tablet (2.5 mg total) by mouth once a week. START HOLD ON 08/04/20 08/04/20   Ahmed Prima,  Fransisco Hertz, PA-C  metoprolol tartrate (LOPRESSOR) 25 MG tablet Take 25 mg by mouth 2 (two) times daily.    [provider]  Multiple Vitamin (MULTIVITAMIN) tablet Take 1 tablet by mouth daily.    [provider]  nitroGLYCERIN (NITROSTAT) 0.4 MG SL tablet Place 0.4 mg under the tongue every 5 (five) minutes x 3 doses as needed for chest pain (If no relief after 3 rd dose report to the ED).    [provider]  pantoprazole (PROTONIX) 40 MG tablet Take 1 tablet (40 mg total) by mouth daily. 12/20/19   Angiulli, Lavon Paganini, PA-C  potassium chloride (KLOR-CON) 10 MEQ tablet Take 1 tablet (10 mEq total) by mouth 2 (two) times daily. 12/19/19   Angiulli, Lavon Paganini, PA-C  ticagrelor (BRILINTA) 90 MG TABS tablet Take 1 tablet (90 mg total) by mouth 2 (two) times daily. Patient not taking: No sig reported 12/19/19   Angiulli, Lavon Paganini, PA-C    No current facility-administered medications for this encounter.   Current Outpatient Medications  Medication Sig Dispense Refill   acetaminophen (TYLENOL) 325 MG tablet Take 2 tablets (650 mg total) by mouth every 4 (four) hours as needed for mild pain (or temp > 37.5 C (99.5 F)).     albuterol (VENTOLIN HFA) 108 (90 Base) MCG/ACT inhaler Inhale 2 puffs into the lungs every 6 (six) hours as needed for wheezing or shortness of breath.     amLODipine (NORVASC) 10 MG tablet Take 10 mg by mouth daily.      aspirin EC 81 MG tablet Take 81 mg by mouth daily.     atorvastatin (LIPITOR) 80 MG tablet Take 1 tablet (80 mg total) by mouth daily at 6 PM.     docusate sodium (COLACE) 100 MG capsule Take 100 mg by mouth daily as needed for mild constipation.     furosemide (LASIX) 20 MG tablet Take 1 tablet (20 mg total) by mouth daily. 90 tablet 3   isosorbide mononitrate (IMDUR) 120 MG 24 hr tablet Take 120 mg by mouth daily. (Patient not taking: Reported on 08/01/2020)     metolazone (ZAROXOLYN) 2.5 MG tablet Take 1 tablet (2.5 mg total) by mouth once  a week. START HOLD ON 08/04/20     metoprolol tartrate (LOPRESSOR) 25 MG tablet Take 25 mg by mouth 2 (two) times daily.     Multiple Vitamin (MULTIVITAMIN) tablet Take 1 tablet by mouth daily.     nitroGLYCERIN (NITROSTAT) 0.4 MG SL tablet Place 0.4 mg under the tongue every 5 (five) minutes x 3 doses as needed for chest pain (If no relief after 3 rd dose report to the ED).     pantoprazole (PROTONIX) 40 MG tablet Take 1 tablet (40 mg total) by mouth daily.     potassium chloride (KLOR-CON) 10 MEQ tablet Take 1 tablet (10 mEq total) by mouth 2 (two) times daily.     ticagrelor (BRILINTA) 90 MG TABS tablet Take 1 tablet (90 mg total) by mouth 2 (two) times daily. (Patient not taking: No sig reported) 60 tablet     Allergies as of 08/06/2020 -  Review Complete 08/06/2020  Allergen Reaction Noted   Shrimp [shellfish allergy]      Past Medical History:  Diagnosis Date   CHF (congestive heart failure) (HCC)    COPD (chronic obstructive pulmonary disease) (HCC)    HTN (hypertension)    Myocardial infarct (HCC)    OSA (obstructive sleep apnea)    on CPAP   Stroke [facility])     Past Surgical History:  Procedure Laterality Date   IR ANGIO INTRA EXTRACRAN SEL COM CAROTID INNOMINATE BILAT MOD SED  11/19/2019   IR ANGIO VERTEBRAL SEL VERTEBRAL BILAT MOD SED  11/19/2019   IR CT HEAD LTD  11/21/2019   IR INTRAVSC STENT CERV CAROTID W/O EMB-PROT MOD SED INC ANGIO  11/21/2019   IR US GUIDE VASC ACCESS RIGHT  11/19/2019   PERCUTANEOUS CORONARY STENT INTERVENTION (PCI-S)     RADIOLOGY WITH ANESTHESIA Right 11/21/2019   Procedure: RIGHT CAROTID STENTING;  Surgeon: Luanne Bras, MD;  Location: [place];  Service: Radiology;  Laterality: Right;    Family History  Problem Relation Age of Onset   Alzheimer's disease Mother    Heart disease Father    Diabetes Sister    Stomach cancer Brother     Social History   Socioeconomic History   Marital status: Married    Spouse name: Charles Sherman   Number of  children: Not on file   Years of education: Not on file   Highest education level: Not on file  Occupational History   Not on file  Tobacco Use   Smoking status: Former    Packs/day: 0.50    Pack years: 0.00    Types: Cigarettes   Smokeless tobacco: Never  Vaping Use   Vaping Use: Never used  Substance and Sexual Activity   Alcohol use: Not Currently    Alcohol/week: 1.0 standard drink    Types: 1 Shots of liquor per week   Drug use: Never   Sexual activity: Not Currently  Other Topics Concern   Not on file  Social History Narrative   Lives with wife   Right Handed   Drinks 3-5 cups caffeine daily   Social Determinants of Health   Financial Resource Strain: Not on file  Food Insecurity: Not on file  Transportation Needs: Not on file  Physical Activity: Not on file  Stress: Not on file  Social Connections: Not on file  Intimate Partner Violence: Not on file     ROS:  General: Negative for fever, chills. +weakness, poor appetite, 60 pound weight loss in past one year per patient. Patient inactive since stroke. Eyes: Negative for vision changes.  ENT: Negative for hoarseness, difficulty swallowing , nasal congestion. CV: Negative for palpitations, dyspnea on exertion, peripheral edema. Complains of chest pain over the entire chest for two weeks. No diaphoresis or sob. Respiratory: Negative for dyspnea at rest, dyspnea on exertion, cough, sputum, wheezing.  GI: See history of present illness. GU:  see hpi MS: Negative for joint pain, low back pain.  Derm: Negative for rash or itching.  Neuro: +weakness, neg abnormal sensation, seizure, frequent headaches, memory loss, confusion.  Psych: Negative for anxiety, depression, suicidal ideation, hallucinations.  Endo:see hpi Heme: Negative for bruising or bleeding. Allergy: Negative for rash or hives.       Physical Examination: Vital signs in last 24 hours: Temp:  [98.4 F (36.9 C)] 98.4 F (36.9 C) (06/15  1507) Pulse Rate:  [54-61] 56 (06/16 0610) Resp:  [17-24] 19 (06/16 0630) BP: (91-117)/(49-70)  117/66 (06/16 0630) SpO2:  [94 %-98 %] 95 % (06/16 0615) Weight:  [98.4 kg] 98.4 kg (06/15 1505)    General: elderly male with flat affect resting comfortable. no acute distress.  Head: Normocephalic, atraumatic.   Eyes: Conjunctiva pink, no icterus. Mouth: Oropharyngeal mucosa moist and pink , no lesions erythema or exudate. Neck: Supple without thyromegaly, masses, or lymphadenopathy.  Lungs: Clear to auscultation bilaterally.  Heart: Regular rate and rhythm, no murmurs rubs or gallops.  Abdomen: Bowel sounds are normal, generalized mild tenderness,, nondistended, no hepatosplenomegaly or masses, no abdominal bruits or    hernia , no rebound or guarding.   Rectal: not performed Extremities: trace bilateral lower extremity edema. no clubbing, deformity.  Neuro: Alert and oriented to person, place.  Skin: Warm and dry, no rash or jaundice.   Psych: Alert and cooperative,flat affect.        Intake/Output from previous day: 06/15 0701 - 06/16 0700 In: 500 [IV Piggyback:500] Out: -  Intake/Output this shift: No intake/output data recorded.  Lab Results: CBC Recent Labs    08/06/20 1733 08/07/20 0636  WBC 7.6 8.7  HGB 14.4 14.1  HCT 41.8 40.3  MCV 95.0 93.5  PLT 223 171   BMET Recent Labs    08/06/20 1733 08/07/20 0636  NA 141 140  K 3.3* 3.8  CL 101 103  CO2 29 28  GLUCOSE 134* 100*  BUN 30* 31*  CREATININE 2.06* 1.97*  CALCIUM 9.9 9.8   LFT Recent Labs    08/06/20 1733 08/07/20 0636  BILITOT 14.9* 14.6*  ALKPHOS 199* 181*  AST 265* 228*  ALT 171* 151*  PROT 7.4 6.6  ALBUMIN 3.1* 2.9*    Lipase No results for input(s): LIPASE in the last 72 hours.  PT/INR Recent Labs    08/06/20 1924 08/07/20 0636  LABPROT 15.9* 15.4*  INR 1.3* 1.2      Imaging Studies: CT ABDOMEN PELVIS WO CONTRAST  Result Date: 08/06/2020 CLINICAL DATA:  Dysuria hematuria  EXAM: CT ABDOMEN AND PELVIS WITHOUT CONTRAST TECHNIQUE: Multidetector CT imaging of the abdomen and pelvis was performed following the standard protocol without IV contrast. COMPARISON:  None FINDINGS: Lower chest: Lung bases demonstrate no acute consolidation or effusion. Coronary vascular calcification. Hepatobiliary: Hepatic steatosis. Possible contour nodularity of the liver as may be seen with cirrhosis. Multiple ill-defined low-attenuation masses within the liver, for example 2.5 cm hypodense lesion in the right hepatic lobe series 6, image 8. No calcified gallstone. No biliary dilatation Pancreas: Unremarkable. No pancreatic ductal dilatation or surrounding inflammatory changes. Spleen: Normal in size without focal abnormality. Adrenals/Urinary Tract: Right adrenal gland is normal. Diffusely thickened left adrenal gland without well-defined mass. Kidneys show no hydronephrosis. Intrarenal vascular calcifications on the left. Probable cyst midpole right kidney. 15 mm exophytic slightly dense lesion off the lower pole right kidney, series 6, image 41. Urinary bladder unremarkable Stomach/Bowel: The stomach is nonenlarged. There is no dilated small bowel. There is diffuse diverticular disease of the colon without acute wall thickening. Negative appendix. Vascular/Lymphatic: Advanced aortic atherosclerosis without aneurysm. No suspicious nodes. Reproductive: Prostate calcification without obvious mass Other: Negative for free air. Small amount of free fluid in the pelvis and adjacent to the liver. Musculoskeletal: No acute or suspicious osseous abnormality. IMPRESSION: 1. No CT evidence for acute intra-abdominal or pelvic abnormality. No hydronephrosis or ureteral stone. 2. Hepatic steatosis with possible contour nodularity/cirrhosis. Multiple ill-defined hypodense liver lesions concerning for metastatic disease or primary liver neoplasm. Trace ascites. When the  patient is clinically stable and able to follow  directions and hold their breath (preferably as an outpatient) further evaluation with dedicated abdominal MRI should be considered. 3. 15 mm indeterminate exophytic lesion off the lower pole right kidney, could also be evaluated at follow-up MRI. 4. Diverticular disease of the colon without acute wall thickening Electronically Signed   By: Donavan Foil M.D.   On: 08/06/2020 20:08   MR ABDOMEN WO CONTRAST  Result Date: 08/07/2020 CLINICAL DATA:  75 year old male with history of abnormal CT examination concerning for metastatic disease to the liver. Follow-up study. EXAM: MRI ABDOMEN WITHOUT CONTRAST TECHNIQUE: Multiplanar multisequence MR imaging was performed without the administration of intravenous contrast. COMPARISON:  No prior abdominal MRI. CT the abdomen and pelvis 08/06/2020. FINDINGS: Comment: Study is severely limited. Per report from the technologist, the patient had altered mental status and would not follow breathing commands, stay still in the scanner, and repeatedly attempted to pull the coil from his body. The examination was terminated before acquisition of all pulse sequences had occurred. Lower chest: Multiple areas of diffusion restriction in the ribs, sternum and lower thoracic spine. Hepatobiliary: Innumerable nodular and mass-like areas of diffusion restriction are noted throughout the liver, most compatible with widespread metastatic disease. These are poorly evaluated on today's limited noncontrast examination, but the largest of these is in the superior aspect of the right lobe of the liver estimated to measure at least 4.3 cm in diameter, with central T2 hyperintensity, which may reflect internal areas of necrosis. Pancreas: Poorly evaluated on today's limited examination, but grossly unremarkable. Spleen: Poorly evaluated on today's limited examination, but grossly unremarkable. Adrenals/Urinary Tract: Poorly evaluated on today's limited examination, but grossly unremarkable. No  definite hydroureteronephrosis in the visualized portions of the abdomen. Stomach/Bowel: Visualized portions are unremarkable. Vascular/Lymphatic: No aneurysm identified in the visualized abdominal vasculature. No definite lymphadenopathy noted in the abdomen. Other: No significant volume of ascites noted in the visualized portions of the abdomen. Musculoskeletal: Numerous small areas of diffusion restriction noted throughout the visualized lumbar spine and right hemipelvis, concerning for widespread metastatic disease to the bones. IMPRESSION: 1. 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 Electronically Signed   By: Vinnie Langton M.D.   On: 08/07/2020 09:23  [4 week]   Impression: Pleasant 75 year old male with numerous comorbidities as outlined above presenting to the ED with complaints of dark urine and lower abdominal pain for 10 days.  GI consulted for hyperbilirubinemia and transaminitis.  Abnormal LFTs: New finding.  LFTs normal in October 2021.  Presented with bilirubin over 14, mildly elevated transaminases with AST greater than ALT, elevated alkaline phosphatase.  INR normal.  CT with concern for possible underlying cirrhosis based on nodular border but also with multiple ill-defined hypodense liver lesions concerning for metastatic disease or primary liver neoplasm.  No biliary dilatation.  Spleen unremarkable.  15 mm indeterminate exophytic lesion of the lower pole right kidney noted but no other source for potential primary seen on CT abdomen and pelvis.  MRI abdomen today very limited due to patient cooperation.  Evidence of metastatic disease involving the liver (largest lesion just over 4 cm) and bone. No EGD or colonoscopy on file. No chest imaging available.  Chest discomfort: two week duration, nonexertional. ?related to referred pain from liver or bone mets. Consider further imaging per attending.   Plan: F/u pending labs.  AFP  tumor marker.  Consider chest imaging to evaluate diffuse chest pain/evaluate for  primary tumor. Recommend oncology consult.   Consider liver biopsy for tissue sample. To discuss with Dr. Abbey Chatters.   We would like to thank you for the opportunity to participate in the care of CAYNE YOM.    LOS: 1 day

## 2020-08-08 ENCOUNTER — Ambulatory Visit (HOSPITAL_COMMUNITY)
Admission: RE | Admit: 2020-08-08 | Discharge: 2020-08-08 | Disposition: A | Discharge status: Home / Self Care | Payer: No Typology Code available for payment source | Source: Ambulatory Visit | Attending: Internal Medicine | Admitting: Internal Medicine

## 2020-08-08 DIAGNOSIS — C787 Secondary malignant neoplasm of liver and intrahepatic bile duct: Secondary | ICD-10-CM | POA: Insufficient documentation

## 2020-08-08 DIAGNOSIS — R918 Other nonspecific abnormal finding of lung field: Secondary | ICD-10-CM | POA: Diagnosis present

## 2020-08-08 LAB — HEPATITIS PANEL, ACUTE
HCV Ab: NONREACTIVE
Hep A IgM: NONREACTIVE
Hep B C IgM: NONREACTIVE
Hepatitis B Surface Ag: NONREACTIVE

## 2020-08-08 LAB — AFP TUMOR MARKER: AFP, Serum, Tumor Marker: 2.8 ng/mL (ref 0.0–8.4)

## 2020-08-08 LAB — CEA: CEA: 6.7 ng/mL — ABNORMAL HIGH (ref 0.0–4.7)

## 2020-08-08 IMAGING — US US BIOPSY CORE LIVER
1 series · 13 of 13 positions shown · non-contrast
Comparison: none

INDICATION: 74-year-old with innumerable liver lesions. Findings are compatible
with metastasis. Patient needs a tissue diagnosis.

[Series 1: us biopsy (liver) · 13 of 13 slices shown]
[im 1/13]
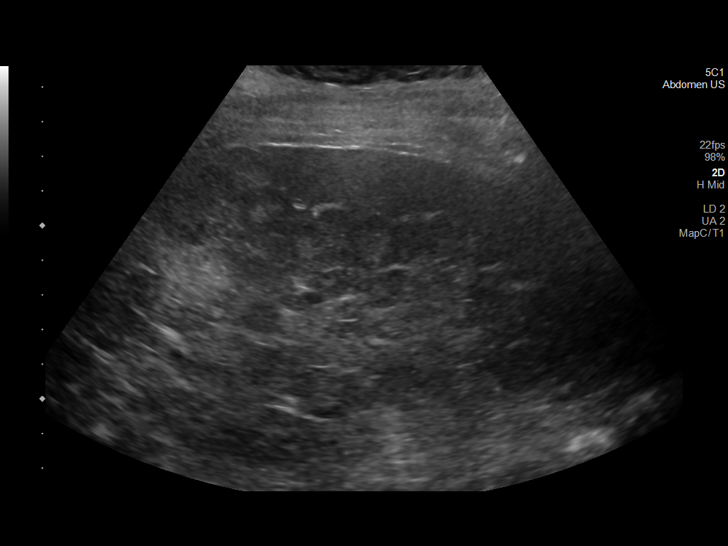
[im 2/13]
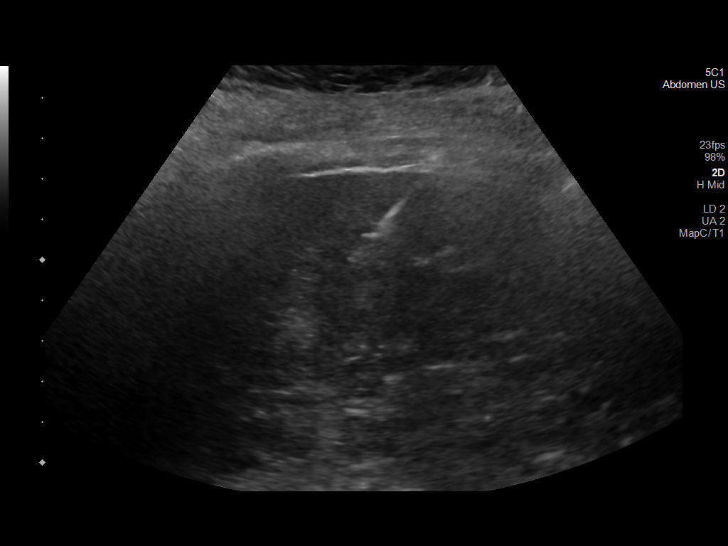
[im 3/13]
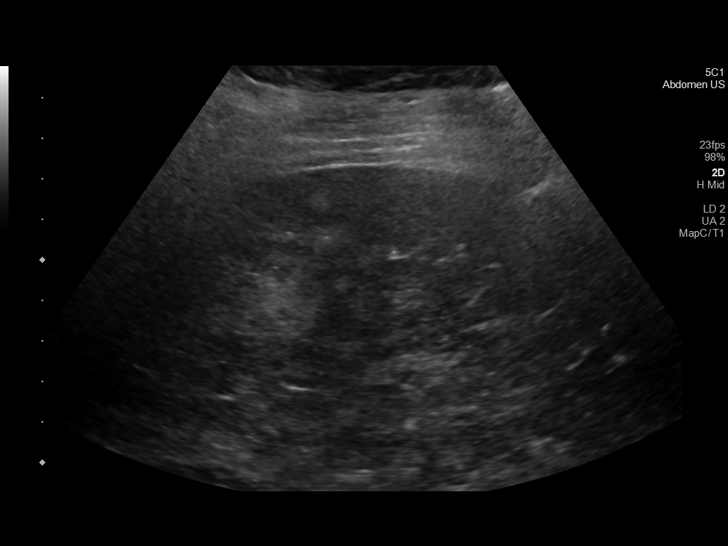
[im 4/13]
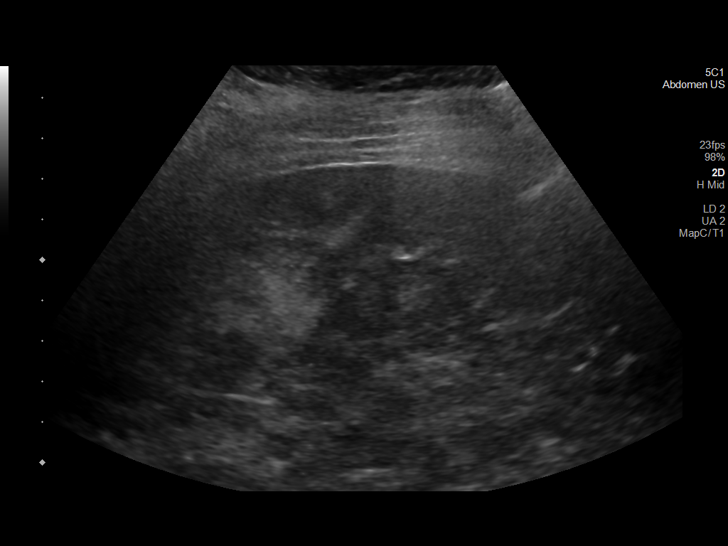
[im 5/13]
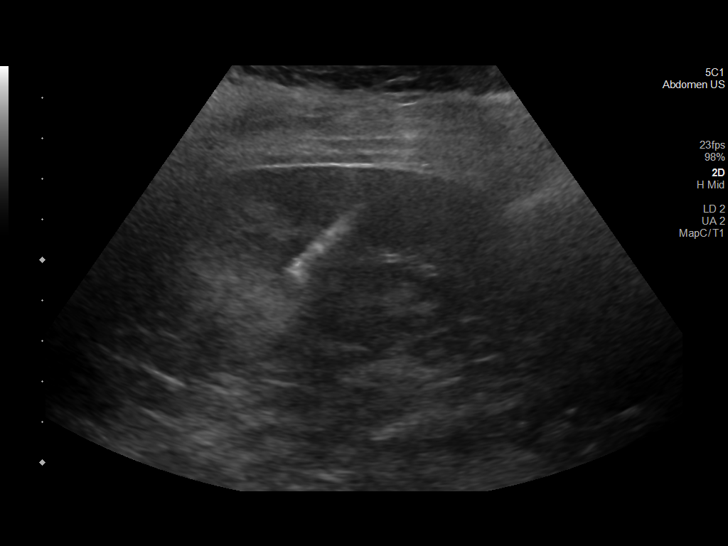
[im 6/13]
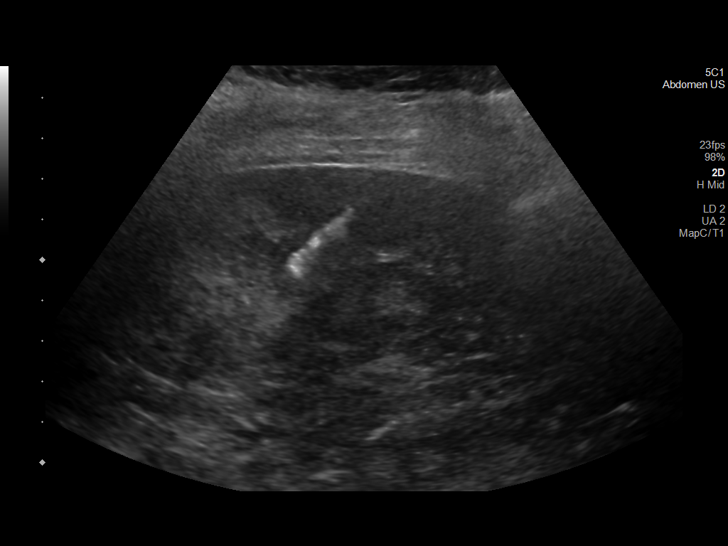
[im 7/13]
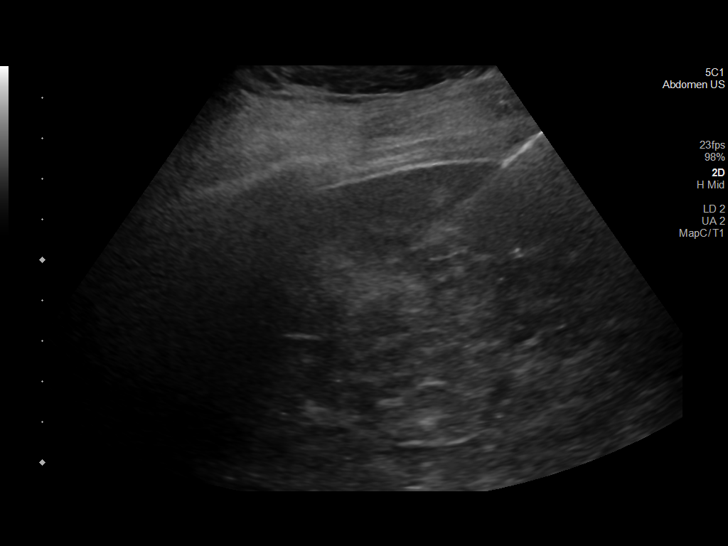
[im 8/13]
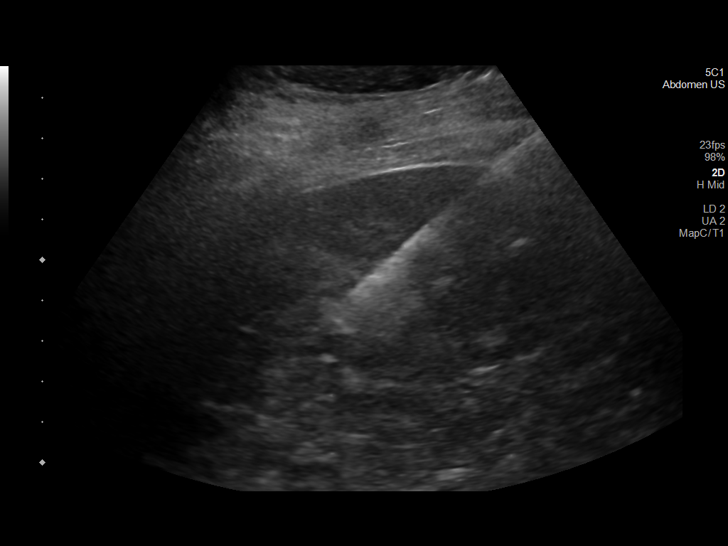
[im 9/13]
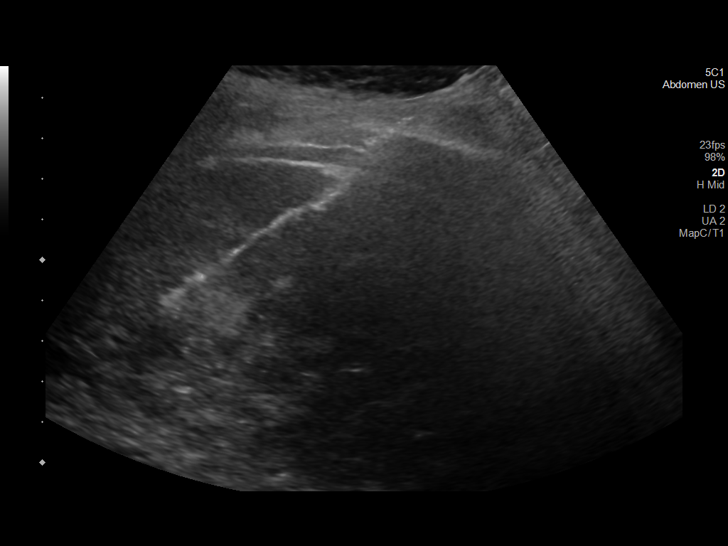
[im 10/13]
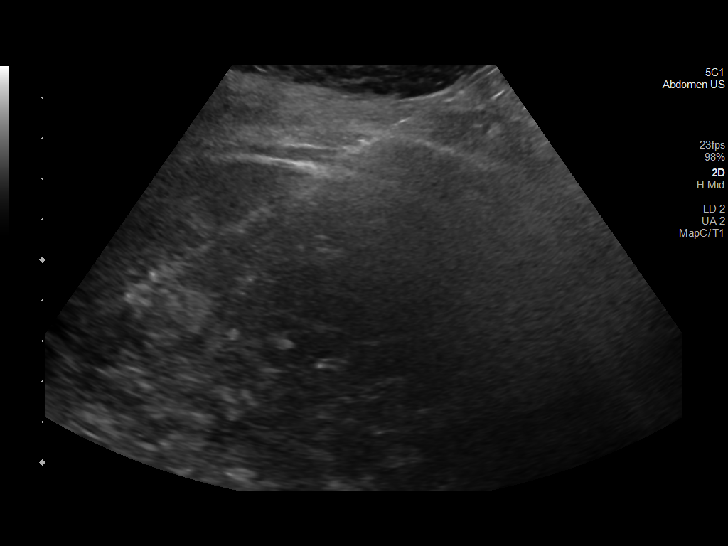
[im 11/13]
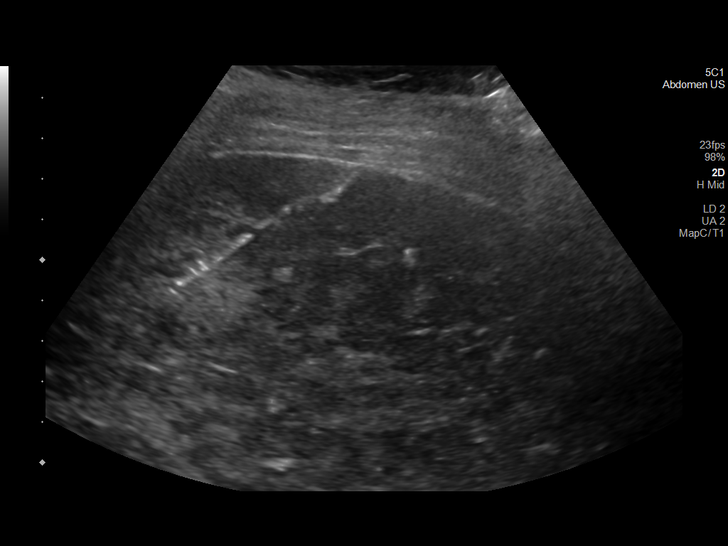
[im 12/13]
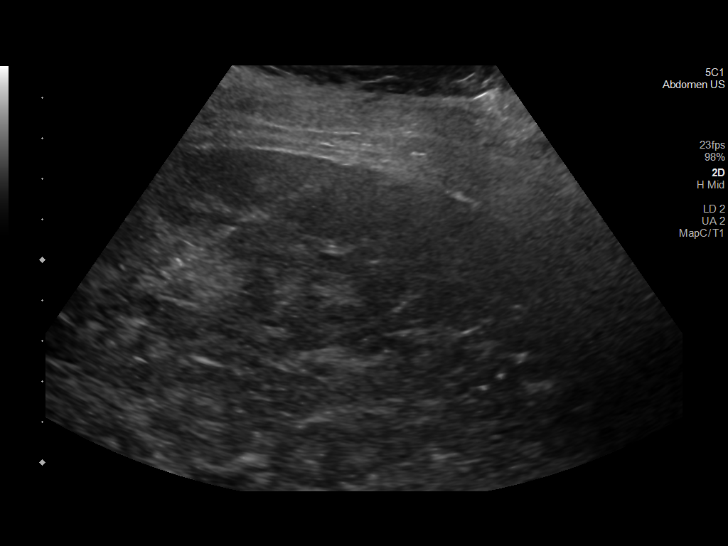
[im 13/13]
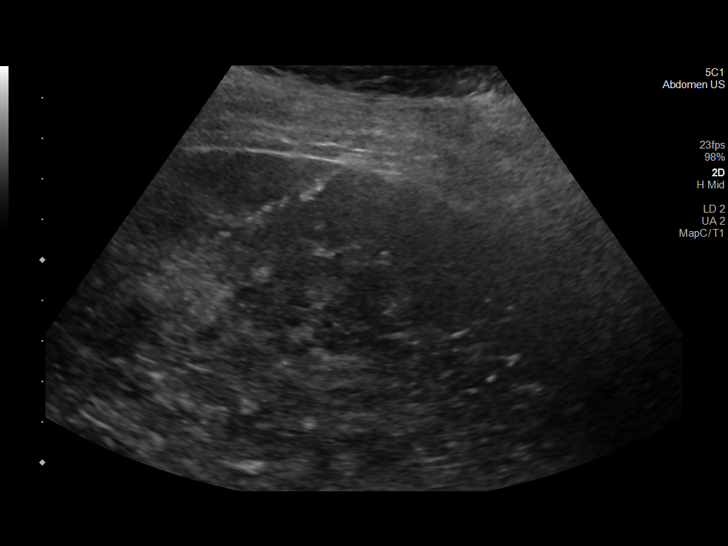

[13 of 13 positions shown; findings below may reference images not displayed]

EXAM:
ULTRASOUND-GUIDED LIVER LESION BIOPSY

MEDICATIONS:
Moderate sedation

ANESTHESIA/SEDATION:
Moderate (conscious) sedation was employed during this procedure. A
total of Versed 0.5 mg and Fentanyl 25 mcg was administered
intravenously.

Moderate Sedation Time: 29 minutes. The patient's level of
consciousness and vital signs were monitored continuously by
radiology nursing throughout the procedure under my direct
supervision.

FLUOROSCOPY TIME:  None

COMPLICATIONS:
None immediate.

PROCEDURE:
Informed consent was obtained from the patient's wife. Maximal
Sterile Barrier Technique was utilized including caps, mask, sterile
gowns, sterile gloves, sterile drape, hand hygiene and skin
antiseptic. A timeout was performed prior to the initiation of the
procedure.

Liver was evaluated with ultrasound. Right hepatic lobe was targeted
for biopsy. Right side of the abdomen was prepped and draped in
sterile fashion. Following the initiation of moderate sedation, the
patient became hypotensive but remained awake and alert. Patient was
given a small fluid bolus. Shortly after the fluid bolus, the
patient's blood pressure returned to baseline. The right side of the
abdomen was again prepped in a sterile fashion. Skin was
anesthetized with 1% lidocaine. Small incision was made. Using
ultrasound guidance, 17 gauge coaxial needle was directed in the
right hepatic lobe. Total of 3 core biopsies were obtained with an
18 gauge core device. Specimens placed in formalin. Gel-Foam slurry
was injected along the needle track at the end of the procedure.
Bandage placed over the puncture site.
FINDINGS: Liver is diffusely heterogeneous and compatible with innumerable
lesions. Findings are compatible with recent MRI findings. Core
biopsies were obtained from small hyperechoic lesions in the right
hepatic lobe. No immediate bleeding.
IMPRESSION: Liver is diffusely heterogeneous and appears to have innumerable
liver lesions. Core biopsies obtained from the right hepatic lobe.

## 2020-08-08 MED ORDER — LIDOCAINE HCL (PF) 1 % IJ SOLN
INTRAMUSCULAR | Status: AC
  Filled 2020-08-08: qty 30

## 2020-08-08 MED ORDER — FENTANYL CITRATE (PF) 100 MCG/2ML IJ SOLN
INTRAMUSCULAR | Status: AC | PRN
  Administered 2020-08-08: 25 ug via INTRAVENOUS

## 2020-08-08 MED ORDER — GELATIN ABSORBABLE 12-7 MM EX MISC
CUTANEOUS | Status: AC
  Filled 2020-08-08: qty 1

## 2020-08-08 MED ORDER — MIDAZOLAM HCL 2 MG/2ML IJ SOLN
INTRAMUSCULAR | Status: AC | PRN
  Administered 2020-08-08: 0.5 mg via INTRAVENOUS

## 2020-08-08 MED ORDER — MIDAZOLAM HCL 2 MG/2ML IJ SOLN
INTRAMUSCULAR | Status: AC
  Filled 2020-08-08: qty 2

## 2020-08-08 MED ORDER — FENTANYL CITRATE (PF) 100 MCG/2ML IJ SOLN
INTRAMUSCULAR | Status: AC
  Filled 2020-08-08: qty 2

## 2020-08-08 NOTE — Progress Notes (Signed)
Spoke with [facility].  Patient not on surgical schedule.  Radiology department at [facility] said procedure would in high probability be done at [facility].

## 2020-08-08 NOTE — Progress Notes (Addendum)
Progress Note    Charles Sherman  EVO:[id number] DOB: [birth year]  DOA: 08/06/2020 PCP: Jacqlyn Larsen, MD      Brief Narrative:    Medical records reviewed and are as summarized below:  Charles Sherman is a 75 y.o. male with past medical history significant for hypertension, hyperlipidemia, CHF, CAD, CAD, who presented to the hospital because of dark urine and lower abdominal pain.  He was found to have elevated liver enzymes with significant hyperbilirubinemia, multiple liver lesions concerning for metastatic disease and lesions in the lumbar spine and right hemipelvis concerning for metastatic disease.  He also has AKI on CKD stage IIIa.    Assessment/Plan:   Principal Problem:   Hyperbilirubinemia Active Problems:   Chronic diastolic CHF (congestive heart failure) (HCC)   Essential hypertension   AKI (acute kidney injury) (HCC)   Hepatic steatosis   Hypokalemia   Transaminitis   Obesity (BMI 30.0-34.9)   GERD (gastroesophageal reflux disease)   CAD (coronary artery disease)   Metastatic cancer to liver (HCC)   Lung mass    Body mass index is 33.21 kg/m.  (Obesity)   Right upper lobe nodule (1.5 cm) suspicious for bronchogenic carcinoma, right hilar adenopathy versus perihilar lung mass (approximately 5.2 cm), multiple liver lesions concerning for metastatic disease, hyperbilirubinemia/elevated liver enzymes:   AFP tumor marker is normal.  CEA tumor marker is elevated at 6.7.  Ultrasound-guided liver biopsy has been ordered (patient was transferred to [facility] today for this procedure).  Analgesics as needed for pain.  Numerous spots in the lumbar spine and right hemipelvis concerning for widespread metastatic disease  AKI on CKD stage IIIa: Continue IV fluids.  Follow-up BMP.   Baseline creatinine around 1.2-1.4.  Hypokalemia: Improved  Other comorbidities include CAD, COPD, OSA on CPAP, hypertension, GERD.  Plan of care was discussed  with patient's sister, Jerrett Baldinger [facility]) and patient's wife (both of them were on speaker phone at the same time).  Diagnosis and prognosis were discussed.  It was decided that comfort measures at home should be pursued with hospice.  Hospice has been consulted.  Follow-up with case manager.  Possible discharge to home tomorrow with hospice.   Diet Order             Diet NPO time specified  Diet effective midnight                      Consultants: Gastroenterologist  Procedures: None    Medications:     Continuous Infusions:   Anti-infectives (From admission, onward)    None              Family Communication/Anticipated D/C date and plan/Code Status   DVT prophylaxis: SCDs Start: 08/06/20 2234     Code Status: Full Code  Family Communication: None Disposition Plan:    Status is: Inpatient  Remains inpatient appropriate because:Inpatient level of care appropriate due to severity of illness  Dispo: The patient is from: Home              Anticipated d/c is to: Home              Patient currently is not medically stable to d/c.   Difficult to place patient No           Subjective:   Interval events noted.  He complains of abdominal pain.  No shortness of breath or chest pain.  Objective:    Vitals:  08/07/20 1811 08/07/20 1953 08/08/20 0006 08/08/20 0359  BP: 137/74 132/70 137/75 135/69  Pulse: 71 84 89 72  Resp: 20 17 18 17   Temp: 98.2 F (36.8 C) 98.1 F (36.7 C) 97.7 F (36.5 C) 98 F (36.7 C)  TempSrc: Oral Oral Oral Oral  SpO2: 100% 95% 97% 99%  Weight:      Height:       No data found.   Intake/Output Summary (Last 24 hours) at 08/08/2020 1353 Last data filed at 08/08/2020 0452 Gross per 24 hour  Intake 45.66 ml  Output 350 ml  Net -304.34 ml   Filed Weights   08/06/20 1505 08/07/20 1344 08/07/20 1359  Weight: 98.4 kg 104.7 kg 108 kg    Exam:  GEN: NAD SKIN: Jaundice EYES: Icteric ENT:  MMM CV: RRR PULM: CTA B ABD: soft, ND, NT, +BS CNS: AAO x 3, non focal EXT: No edema or tenderness     Data Reviewed:   I have personally reviewed following labs and imaging studies:  Labs: Labs show the following:   Basic Metabolic Panel: Recent Labs  Lab 08/01/20 1434 08/06/20 1733 08/07/20 0636  NA 139 141 140  K 3.1* 3.3* 3.8  CL 99 101 103  CO2 29 29 28   GLUCOSE 133* 134* 100*  BUN 36* 30* 31*  CREATININE 2.21* 2.06* 1.97*  CALCIUM 9.6 9.9 9.8  MG  --   --  2.5*  PHOS  --   --  3.3   GFR Estimated Creatinine Clearance: 39.9 mL/min (A) (by C-G formula based on SCr of 1.97 mg/dL (H)). Liver Function Tests: Recent Labs  Lab 08/06/20 1733 08/07/20 0636  AST 265* 228*  ALT 171* 151*  ALKPHOS 199* 181*  BILITOT 14.9* 14.6*  PROT 7.4 6.6  ALBUMIN 3.1* 2.9*   No results for input(s): LIPASE, AMYLASE in the last 168 hours. Recent Labs  Lab 08/06/20 2018  AMMONIA 33   Coagulation profile Recent Labs  Lab 08/06/20 1924 08/07/20 0636  INR 1.3* 1.2    CBC: Recent Labs  Lab 08/01/20 1434 08/06/20 1733 08/07/20 0636  WBC 6.9 7.6 8.7  NEUTROABS  --  5.1  --   HGB 13.7 14.4 14.1  HCT 40.3 41.8 40.3  MCV 94.6 95.0 93.5  PLT 212 223 171   Cardiac Enzymes: No results for input(s): CKTOTAL, CKMB, CKMBINDEX, TROPONINI in the last 168 hours. BNP (last 3 results) No results for input(s): PROBNP in the last 8760 hours. CBG: No results for input(s): GLUCAP in the last 168 hours. D-Dimer: No results for input(s): DDIMER in the last 72 hours. Hgb A1c: No results for input(s): HGBA1C in the last 72 hours. Lipid Profile: No results for input(s): CHOL, HDL, LDLCALC, TRIG, CHOLHDL, LDLDIRECT in the last 72 hours. Thyroid function studies: No results for input(s): TSH, T4TOTAL, T3FREE, THYROIDAB in the last 72 hours.  Invalid input(s): FREET3 Anemia work up: No results for input(s): VITAMINB12, FOLATE, FERRITIN, TIBC, IRON, RETICCTPCT in the last 72  hours. Sepsis Labs: Recent Labs  Lab 08/01/20 1434 08/06/20 1733 08/07/20 0636  WBC 6.9 7.6 8.7    Microbiology Recent Results (from the past 240 hour(s))  Resp Panel by RT-PCR (Flu A&B, Covid) Nasopharyngeal Swab     Status: None   Collection Time: 08/06/20  8:01 PM   Specimen: Nasopharyngeal Swab; Nasopharyngeal(NP) swabs in vial transport medium  Result Value Ref Range Status   SARS Coronavirus 2 by RT PCR NEGATIVE NEGATIVE Final  Comment: (NOTE) SARS-CoV-2 target nucleic acids are NOT DETECTED.  The SARS-CoV-2 RNA is generally detectable in upper respiratory specimens during the acute phase of infection. The lowest concentration of SARS-CoV-2 viral copies this assay can detect is 138 copies/mL. A negative result does not preclude SARS-Cov-2 infection and should not be used as the sole basis for treatment or other patient management decisions. A negative result may occur with  improper specimen collection/handling, submission of specimen other than nasopharyngeal swab, presence of viral mutation(s) within the areas targeted by this assay, and inadequate number of viral copies(<138 copies/mL). A negative result must be combined with clinical observations, patient history, and epidemiological information. The expected result is Negative.  Fact Sheet for Patients:  EntrepreneurPulse.com.au  Fact Sheet for Healthcare Providers:  IncredibleEmployment.be  This test is no t yet approved or cleared by the [place] FDA and  has been authorized for detection and/or diagnosis of SARS-CoV-2 by FDA under an Emergency Use Authorization (EUA). This EUA will remain  in effect (meaning this test can be used) for the duration of the COVID-19 declaration under Section 564(b)(1) of the Act, 21 U.S.C.section 360bbb-3(b)(1), unless the authorization is terminated  or revoked sooner.       Influenza A by PCR NEGATIVE NEGATIVE Final   Influenza B  by PCR NEGATIVE NEGATIVE Final    Comment: (NOTE) The Xpert Xpress SARS-CoV-2/FLU/RSV plus assay is intended as an aid in the diagnosis of influenza from Nasopharyngeal swab specimens and should not be used as a sole basis for treatment. Nasal washings and aspirates are unacceptable for Xpert Xpress SARS-CoV-2/FLU/RSV testing.  Fact Sheet for Patients: EntrepreneurPulse.com.au  Fact Sheet for Healthcare Providers: IncredibleEmployment.be  This test is not yet approved or cleared by the [place] FDA and has been authorized for detection and/or diagnosis of SARS-CoV-2 by FDA under an Emergency Use Authorization (EUA). This EUA will remain in effect (meaning this test can be used) for the duration of the COVID-19 declaration under Section 564(b)(1) of the Act, 21 U.S.C. section 360bbb-3(b)(1), unless the authorization is terminated or revoked.  Performed at [facility], [address], [place], [place] [address]     Procedures and diagnostic studies:  CT ABDOMEN PELVIS WO CONTRAST  Result Date: 08/06/2020 CLINICAL DATA:  Dysuria hematuria EXAM: CT ABDOMEN AND PELVIS WITHOUT CONTRAST TECHNIQUE: Multidetector CT imaging of the abdomen and pelvis was performed following the standard protocol without IV contrast. COMPARISON:  None FINDINGS: Lower chest: Lung bases demonstrate no acute consolidation or effusion. Coronary vascular calcification. Hepatobiliary: Hepatic steatosis. Possible contour nodularity of the liver as may be seen with cirrhosis. Multiple ill-defined low-attenuation masses within the liver, for example 2.5 cm hypodense lesion in the right hepatic lobe series 6, image 8. No calcified gallstone. No biliary dilatation Pancreas: Unremarkable. No pancreatic ductal dilatation or surrounding inflammatory changes. Spleen: Normal in size without focal abnormality. Adrenals/Urinary Tract: Right adrenal gland is normal. Diffusely thickened left  adrenal gland without well-defined mass. Kidneys show no hydronephrosis. Intrarenal vascular calcifications on the left. Probable cyst midpole right kidney. 15 mm exophytic slightly dense lesion off the lower pole right kidney, series 6, image 41. Urinary bladder unremarkable Stomach/Bowel: The stomach is nonenlarged. There is no dilated small bowel. There is diffuse diverticular disease of the colon without acute wall thickening. Negative appendix. Vascular/Lymphatic: Advanced aortic atherosclerosis without aneurysm. No suspicious nodes. Reproductive: Prostate calcification without obvious mass Other: Negative for free air. Small amount of free fluid in the pelvis and adjacent to the  liver. Musculoskeletal: No acute or suspicious osseous abnormality. IMPRESSION: 1. No CT evidence for acute intra-abdominal or pelvic abnormality. No hydronephrosis or ureteral stone. 2. Hepatic steatosis with possible contour nodularity/cirrhosis. Multiple ill-defined hypodense liver lesions concerning for metastatic disease or primary liver neoplasm. Trace ascites. When the patient is clinically stable and able to follow directions and hold their breath (preferably as an outpatient) further evaluation with dedicated abdominal MRI should be considered. 3. 15 mm indeterminate exophytic lesion off the lower pole right kidney, could also be evaluated at follow-up MRI. 4. Diverticular disease of the colon without acute wall thickening Electronically Signed   By: Donavan Foil M.D.   On: 08/06/2020 20:08   CT CHEST WO CONTRAST  Result Date: 08/07/2020 CLINICAL DATA:  Cancer of unknown primary.  Liver lesion. EXAM: CT CHEST WITHOUT CONTRAST TECHNIQUE: Multidetector CT imaging of the chest was performed following the standard protocol without IV contrast. COMPARISON:  None FINDINGS: Cardiovascular: Normal heart size. No pericardial effusion. Aortic atherosclerosis and coronary artery calcifications. Mediastinum/Nodes: Normal appearance  of the thyroid gland. The trachea appears patent and is midline. No supraclavicular or axillary adenopathy. No mediastinal lymph nodes. The hilar structures are suboptimally evaluated due to lack of IV contrast material. Lungs/Pleura: No pleural effusion, airspace consolidation or atelectasis. Right upper lobe spiculated nodule measures 1.5 cm, image 61/4. Right hilar adenopathy versus perihilar lung mass is suspected measuring approximately 5.2 cm. Adjacent satellite nodule measures 3 mm, image 63/4. However, due to lack of IV contrast material this area is indeterminate and incompletely evaluated. Upper Abdomen: Limited imaging through the upper abdomen again shows multiple liver lesions compatible with diffuse metastatic disease. This is better evaluated on MRI from earlier today. Musculoskeletal: No chest wall mass or suspicious bone lesions identified. IMPRESSION: 1. Right upper lobe spiculated nodule is identified measuring 1.5 cm. Adjacent satellite nodule measures 3 mm. Suspicious for primary bronchogenic carcinoma. 2. Right hilar adenopathy versus perihilar lung mass is suspected measuring approximately 5.2 cm. However, due to lack of IV contrast material this area is indeterminate and incompletely evaluated. Recommend repeat imaging following IV contrast administration versus further investigation with PET-CT. 3. Diffuse liver lesions compatible with diffuse metastatic disease. This is better evaluated on MRI from earlier today. 4. Aortic atherosclerosis and coronary artery calcifications. Aortic Atherosclerosis (ICD10-I70.0). Electronically Signed   By: Kerby Moors M.D.   On: 08/07/2020 17:24   MR ABDOMEN WO CONTRAST  Result Date: 08/07/2020 CLINICAL DATA:  75 year old male with history of abnormal CT examination concerning for metastatic disease to the liver. Follow-up study. EXAM: MRI ABDOMEN WITHOUT CONTRAST TECHNIQUE: Multiplanar multisequence MR imaging was performed without the administration  of intravenous contrast. COMPARISON:  No prior abdominal MRI. CT the abdomen and pelvis 08/06/2020. FINDINGS: Comment: Study is severely limited. Per report from the technologist, the patient had altered mental status and would not follow breathing commands, stay still in the scanner, and repeatedly attempted to pull the coil from his body. The examination was terminated before acquisition of all pulse sequences had occurred. Lower chest: Multiple areas of diffusion restriction in the ribs, sternum and lower thoracic spine. Hepatobiliary: Innumerable nodular and mass-like areas of diffusion restriction are noted throughout the liver, most compatible with widespread metastatic disease. These are poorly evaluated on today's limited noncontrast examination, but the largest of these is in the superior aspect of the right lobe of the liver estimated to measure at least 4.3 cm in diameter, with central T2 hyperintensity, which may reflect internal areas of  necrosis. Pancreas: Poorly evaluated on today's limited examination, but grossly unremarkable. Spleen: Poorly evaluated on today's limited examination, but grossly unremarkable. Adrenals/Urinary Tract: Poorly evaluated on today's limited examination, but grossly unremarkable. No definite hydroureteronephrosis in the visualized portions of the abdomen. Stomach/Bowel: Visualized portions are unremarkable. Vascular/Lymphatic: No aneurysm identified in the visualized abdominal vasculature. No definite lymphadenopathy noted in the abdomen. Other: No significant volume of ascites noted in the visualized portions of the abdomen. Musculoskeletal: Numerous small areas of diffusion restriction noted throughout the visualized lumbar spine and right hemipelvis, concerning for widespread metastatic disease to the bones. IMPRESSION: 1. 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 Electronically Signed   By: Vinnie Langton M.D.    On: 08/07/2020 09:23               LOS: 2 days   Fin Hupp  [facility]   Pager on [email or web address]. If 7PM-7AM, please contact night-coverage at [email or web address]     08/08/2020, 1:53 PM

## 2020-08-08 NOTE — Sedation Documentation (Signed)
spo2 drops to 89% on room air when asleep

## 2020-08-08 NOTE — Progress Notes (Signed)
Spoke with [place] at [facility] , patient has been scheduled if he can be there by 12pm . Called [facility] , they are going to be able to take him by then, Attempt to call us back to notify them of transport, no answer at this time. Will continue to monitor and try to call us at [facility] back as well. Patient lying in bed , stable  condition . Medical necessity in transport package.

## 2020-08-08 NOTE — Progress Notes (Signed)
Spoke with ultrasound at [facility] and patient not on IR schedule. Instructed to call back after 0700 to determine where and what time procedure will be done today. Called [facility] to prepare for potential transport to [facility]. Will notify [facility] once time and place of procedure is known.

## 2020-08-08 NOTE — Consult Note (Signed)
Chief Complaint: Patient was seen in consultation today for hyperbilirubinemia  Referring Physician(s): Dr. Delton Coombes  Supervising Physician: Markus Daft  Patient Status: [facility]-in[facility]  History of Present Illness: Charles Sherman is a 75 y.o. 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 He presented to [facility] 6/15 with 7-10 days of little intakct, malaise, dark urine.  He was found to have hyperbilirubinemia with SCr of 2.06.  CT Abdomen Pelvis showed multiple liver lesions, MR consistent with widespread metastatic disease. IR consulted for liver lesion biopsy. Case reviewed and approved by Dr. Anselm Pancoast.   Past Medical History:  Diagnosis Date   CHF (congestive heart failure) (HCC)    COPD (chronic obstructive pulmonary disease) (HCC)    HTN (hypertension)    Myocardial infarct (HCC)    OSA (obstructive sleep apnea)    on CPAP   Stroke [facility])     Past Surgical History:  Procedure Laterality Date   IR ANGIO INTRA EXTRACRAN SEL COM CAROTID INNOMINATE BILAT MOD SED  11/19/2019   IR ANGIO VERTEBRAL SEL VERTEBRAL BILAT MOD SED  11/19/2019   IR CT HEAD LTD  11/21/2019   IR INTRAVSC STENT CERV CAROTID W/O EMB-PROT MOD SED INC ANGIO  11/21/2019   IR US GUIDE VASC ACCESS RIGHT  11/19/2019   PERCUTANEOUS CORONARY STENT INTERVENTION (PCI-S)     RADIOLOGY WITH ANESTHESIA Right 11/21/2019   Procedure: RIGHT CAROTID STENTING;  Surgeon: Luanne Bras, MD;  Location: [place];  Service: Radiology;  Laterality: Right;    Allergies: Shrimp [shellfish allergy]  Medications: Prior to Admission medications   Medication Sig Start Date End Date Taking? Authorizing Provider  acetaminophen (TYLENOL) 325 MG tablet Take 2 tablets (650 mg total) by mouth every 4 (four) hours as needed for mild pain (or temp > 37.5 C (99.5 F)). 12/19/19  Yes Angiulli, Lavon Paganini,  PA-C  albuterol (VENTOLIN HFA) 108 (90 Base) MCG/ACT inhaler Inhale 2 puffs into the lungs every 6 (six) hours as needed for wheezing or shortness of breath.   Yes [provider]  amLODipine (NORVASC) 10 MG tablet Take 10 mg by mouth daily.    Yes [provider]  aspirin EC 81 MG tablet Take 81 mg by mouth daily.   Yes [provider]  atorvastatin (LIPITOR) 80 MG tablet Take 1 tablet (80 mg total) by mouth daily at 6 PM. 12/19/19  Yes Angiulli, Lavon Paganini, PA-C  docusate sodium (COLACE) 100 MG capsule Take 100 mg by mouth daily as needed for mild constipation.   Yes [provider]  furosemide (LASIX) 20 MG tablet Take 1 tablet (20 mg total) by mouth daily. 08/04/20 11/02/20 Yes Strader, Fransisco Hertz, PA-C  metoprolol tartrate (LOPRESSOR) 25 MG tablet Take 25 mg by mouth 2 (two) times daily.   Yes [provider]  Multiple Vitamin (MULTIVITAMIN) tablet Take 1 tablet by mouth daily.   Yes [provider]  nitroGLYCERIN (NITROSTAT) 0.4 MG SL tablet Place 0.4 mg under the tongue every 5 (five) minutes x 3 doses as needed for chest pain (If no relief after 3 rd dose report to the ED).   Yes [provider]  pantoprazole (PROTONIX) 40 MG tablet Take 1 tablet (40 mg total) by mouth daily. 12/20/19  Yes Angiulli, Lavon Paganini, PA-C  potassium chloride (KLOR-CON) 10 MEQ tablet Take 1 tablet (10 mEq total) by mouth  2 (two) times daily. 12/19/19  Yes Angiulli, Lavon Paganini, PA-C  isosorbide mononitrate (IMDUR) 120 MG 24 hr tablet Take 120 mg by mouth daily. Patient not taking: No sig reported    [provider]  metolazone (ZAROXOLYN) 2.5 MG tablet Take 1 tablet (2.5 mg total) by mouth once a week. START HOLD ON 08/04/20 Patient not taking: Reported on 08/07/2020 08/04/20   Erma Heritage, PA-C  ticagrelor (BRILINTA) 90 MG TABS tablet Take 1 tablet (90 mg total) by mouth 2 (two) times daily. Patient not taking: No sig reported 12/19/19   Angiulli,  Lavon Paganini, PA-C     Family History  Problem Relation Age of Onset   Alzheimer's disease Mother    Heart disease Father    Diabetes Sister    Stomach cancer Brother     Social History   Socioeconomic History   Marital status: Married    Spouse name: Grayland Jack   Number of children: Not on file   Years of education: Not on file   Highest education level: Not on file  Occupational History   Not on file  Tobacco Use   Smoking status: Former    Packs/day: 0.50    Pack years: 0.00    Types: Cigarettes   Smokeless tobacco: Never  Vaping Use   Vaping Use: Never used  Substance and Sexual Activity   Alcohol use: Not Currently    Alcohol/week: 1.0 standard drink    Types: 1 Shots of liquor per week   Drug use: Never   Sexual activity: Not Currently  Other Topics Concern   Not on file  Social History Narrative   Lives with wife   Right Handed   Drinks 3-5 cups caffeine daily   Social Determinants of Health   Financial Resource Strain: Not on file  Food Insecurity: Not on file  Transportation Needs: Not on file  Physical Activity: Not on file  Stress: Not on file  Social Connections: Not on file     Review of Systems: A 12 point ROS discussed and pertinent positives are indicated in the HPI above.  All other systems are negative.  Review of Systems  Unable to perform ROS: Acuity of condition   Vital Signs: BP 135/69 (BP Location: Right Arm)   Pulse 72   Temp 98 F (36.7 C) (Oral)   Resp 17   Ht 5\' 11"  (1.803 m)   Wt 238 lb 1.6 oz (108 kg)   SpO2 99%   BMI 33.21 kg/m   Physical Exam Vitals and nursing note reviewed.  Constitutional:      General: He is not in acute distress.    Appearance: Normal appearance. He is not ill-appearing.  HENT:     Mouth/Throat:     Mouth: Mucous membranes are moist.     Pharynx: Oropharynx is clear.  Cardiovascular:     Rate and Rhythm: Normal rate and regular rhythm.  Pulmonary:     Effort: Pulmonary effort is normal.      Breath sounds: Normal breath sounds.  Abdominal:     General: Abdomen is flat.     Palpations: Abdomen is soft.  Skin:    General: Skin is warm and dry.  Neurological:     General: No focal deficit present.     Mental Status: He is alert. Mental status is at baseline.  Psychiatric:        Mood and Affect: Mood normal.        Behavior: Behavior  normal.     MD Evaluation Airway: WNL Heart: WNL Abdomen: WNL Chest/ Lungs: WNL ASA  Classification: 3 Mallampati/Airway Score: Two   Imaging: CT ABDOMEN PELVIS WO CONTRAST  Result Date: 08/06/2020 CLINICAL DATA:  Dysuria hematuria EXAM: CT ABDOMEN AND PELVIS WITHOUT CONTRAST TECHNIQUE: Multidetector CT imaging of the abdomen and pelvis was performed following the standard protocol without IV contrast. COMPARISON:  None FINDINGS: Lower chest: Lung bases demonstrate no acute consolidation or effusion. Coronary vascular calcification. Hepatobiliary: Hepatic steatosis. Possible contour nodularity of the liver as may be seen with cirrhosis. Multiple ill-defined low-attenuation masses within the liver, for example 2.5 cm hypodense lesion in the right hepatic lobe series 6, image 8. No calcified gallstone. No biliary dilatation Pancreas: Unremarkable. No pancreatic ductal dilatation or surrounding inflammatory changes. Spleen: Normal in size without focal abnormality. Adrenals/Urinary Tract: Right adrenal gland is normal. Diffusely thickened left adrenal gland without well-defined mass. Kidneys show no hydronephrosis. Intrarenal vascular calcifications on the left. Probable cyst midpole right kidney. 15 mm exophytic slightly dense lesion off the lower pole right kidney, series 6, image 41. Urinary bladder unremarkable Stomach/Bowel: The stomach is nonenlarged. There is no dilated small bowel. There is diffuse diverticular disease of the colon without acute wall thickening. Negative appendix. Vascular/Lymphatic: Advanced aortic atherosclerosis without  aneurysm. No suspicious nodes. Reproductive: Prostate calcification without obvious mass Other: Negative for free air. Small amount of free fluid in the pelvis and adjacent to the liver. Musculoskeletal: No acute or suspicious osseous abnormality. IMPRESSION: 1. No CT evidence for acute intra-abdominal or pelvic abnormality. No hydronephrosis or ureteral stone. 2. Hepatic steatosis with possible contour nodularity/cirrhosis. Multiple ill-defined hypodense liver lesions concerning for metastatic disease or primary liver neoplasm. Trace ascites. When the patient is clinically stable and able to follow directions and hold their breath (preferably as an outpatient) further evaluation with dedicated abdominal MRI should be considered. 3. 15 mm indeterminate exophytic lesion off the lower pole right kidney, could also be evaluated at follow-up MRI. 4. Diverticular disease of the colon without acute wall thickening Electronically Signed   By: Donavan Foil M.D.   On: 08/06/2020 20:08   CT CHEST WO CONTRAST  Result Date: 08/07/2020 CLINICAL DATA:  Cancer of unknown primary.  Liver lesion. EXAM: CT CHEST WITHOUT CONTRAST TECHNIQUE: Multidetector CT imaging of the chest was performed following the standard protocol without IV contrast. COMPARISON:  None FINDINGS: Cardiovascular: Normal heart size. No pericardial effusion. Aortic atherosclerosis and coronary artery calcifications. Mediastinum/Nodes: Normal appearance of the thyroid gland. The trachea appears patent and is midline. No supraclavicular or axillary adenopathy. No mediastinal lymph nodes. The hilar structures are suboptimally evaluated due to lack of IV contrast material. Lungs/Pleura: No pleural effusion, airspace consolidation or atelectasis. Right upper lobe spiculated nodule measures 1.5 cm, image 61/4. Right hilar adenopathy versus perihilar lung mass is suspected measuring approximately 5.2 cm. Adjacent satellite nodule measures 3 mm, image 63/4. However,  due to lack of IV contrast material this area is indeterminate and incompletely evaluated. Upper Abdomen: Limited imaging through the upper abdomen again shows multiple liver lesions compatible with diffuse metastatic disease. This is better evaluated on MRI from earlier today. Musculoskeletal: No chest wall mass or suspicious bone lesions identified. IMPRESSION: 1. Right upper lobe spiculated nodule is identified measuring 1.5 cm. Adjacent satellite nodule measures 3 mm. Suspicious for primary bronchogenic carcinoma. 2. Right hilar adenopathy versus perihilar lung mass is suspected measuring approximately 5.2 cm. However, due to lack of IV contrast material this area is  indeterminate and incompletely evaluated. Recommend repeat imaging following IV contrast administration versus further investigation with PET-CT. 3. Diffuse liver lesions compatible with diffuse metastatic disease. This is better evaluated on MRI from earlier today. 4. Aortic atherosclerosis and coronary artery calcifications. Aortic Atherosclerosis (ICD10-I70.0). Electronically Signed   By: Kerby Moors M.D.   On: 08/07/2020 17:24   MR ABDOMEN WO CONTRAST  Result Date: 08/07/2020 CLINICAL DATA:  75 year old male with history of abnormal CT examination concerning for metastatic disease to the liver. Follow-up study. EXAM: MRI ABDOMEN WITHOUT CONTRAST TECHNIQUE: Multiplanar multisequence MR imaging was performed without the administration of intravenous contrast. COMPARISON:  No prior abdominal MRI. CT the abdomen and pelvis 08/06/2020. FINDINGS: Comment: Study is severely limited. Per report from the technologist, the patient had altered mental status and would not follow breathing commands, stay still in the scanner, and repeatedly attempted to pull the coil from his body. The examination was terminated before acquisition of all pulse sequences had occurred. Lower chest: Multiple areas of diffusion restriction in the ribs, sternum and lower  thoracic spine. Hepatobiliary: Innumerable nodular and mass-like areas of diffusion restriction are noted throughout the liver, most compatible with widespread metastatic disease. These are poorly evaluated on today's limited noncontrast examination, but the largest of these is in the superior aspect of the right lobe of the liver estimated to measure at least 4.3 cm in diameter, with central T2 hyperintensity, which may reflect internal areas of necrosis. Pancreas: Poorly evaluated on today's limited examination, but grossly unremarkable. Spleen: Poorly evaluated on today's limited examination, but grossly unremarkable. Adrenals/Urinary Tract: Poorly evaluated on today's limited examination, but grossly unremarkable. No definite hydroureteronephrosis in the visualized portions of the abdomen. Stomach/Bowel: Visualized portions are unremarkable. Vascular/Lymphatic: No aneurysm identified in the visualized abdominal vasculature. No definite lymphadenopathy noted in the abdomen. Other: No significant volume of ascites noted in the visualized portions of the abdomen. Musculoskeletal: Numerous small areas of diffusion restriction noted throughout the visualized lumbar spine and right hemipelvis, concerning for widespread metastatic disease to the bones. IMPRESSION: 1. 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 Electronically Signed   By: Vinnie Langton M.D.   On: 08/07/2020 09:23    Labs:  CBC: Recent Labs    06/16/20 1408 08/01/20 1434 08/06/20 1733 08/07/20 0636  WBC 7.5 6.9 7.6 8.7  HGB 13.4 13.7 14.4 14.1  HCT 40.2 40.3 41.8 40.3  PLT 200 212 223 171    COAGS: Recent Labs    08/06/20 1924 08/07/20 0636  INR 1.3* 1.2  APTT  --  25    BMP: Recent Labs    11/22/19 0500 11/24/19 0355 11/25/19 0046 11/26/19 0249 11/29/19 0557 06/16/20 1408 08/01/20 1434 08/06/20 1733 08/07/20 0636  NA 137 135 134* 137   < > 137 139 141 140  K  4.4 4.2 4.3 4.3   < > 3.7 3.1* 3.3* 3.8  CL 106 102 104 103   < > 102 99 101 103  CO2 25 24 25 28    < > 27 29 29 28   GLUCOSE 111* 132* 124* 132*   < > 96 133* 134* 100*  BUN 27* 34* 29* 29*   < > 17 36* 30* 31*  CALCIUM 8.9 8.5* 8.5* 8.6*   < > 9.3 9.6 9.9 9.8  CREATININE 1.08 1.19 1.11 1.27*   < > 1.35* 2.21* 2.06* 1.97*  GFRNONAA >60 59* >60 55*   < > 54* 30* 33* 35*  GFRAA >60 >60 >60 >60  --   --   --   --   --    < > = values in this interval not displayed.    LIVER FUNCTION TESTS: Recent Labs    11/29/19 0557 08/06/20 1733 08/07/20 0636  BILITOT 0.6 14.9* 14.6*  AST 20 265* 228*  ALT 33 171* 151*  ALKPHOS 48 199* 181*  PROT 4.8* 7.4 6.6  ALBUMIN 2.4* 3.1* 2.9*    TUMOR MARKERS: No results for input(s): AFPTM, CEA, CA199, CHROMGRNA in the last 8760 hours.  Assessment and Plan: Liver lesions Patient with history of CVA s/p stent placement now presents with ~2 week history of malaise, poor intake, weakness at home.  He is found to have multiple liver lesions concerning for metastatic disease.  IR consulted for liver lesion biopsy.  Patient not thought to be a candidate for treatment based on advanced disease with poor performance status since stroke, however family wishes to pursue diagnostic biopsy with redicussion with Oncology once results known.  Patient has have a history of Brilinta use, however appears he has not taken this medication based on chart review since at least April of 2022 after consultation with Dr. Leonie Man.  He did continue aspirin for stent protection/stroke prevention. His last dose of aspirin was 6/15.  INR 1.3. Spoke with wife and updated on plans for biopsy today. Obtained consent from wife and discussed with sister per her request as well.  Mr. Coppa has been NPO today.  Plans to return to [facility] post-procedure.   Risks and benefits of liver biopsy was discussed with the patient and/or patient's family including, but not limited to bleeding,  infection, damage to adjacent structures or low yield requiring additional tests.  All of the questions were answered and there is agreement to proceed.  Consent signed and in chart.  Thank you for this interesting consult.  I greatly enjoyed meeting HAREL REPETTO and look forward to participating in their care.  A copy of this report was sent to the requesting provider on this date.  Electronically Signed: Docia Barrier, PA 08/08/2020, 1:21 PM   I spent a total of 40 Minutes    in face to face in clinical consultation, greater than 50% of which was counseling/coordinating care for liver lesion.

## 2020-08-08 NOTE — Progress Notes (Signed)
Patient transferred via carelink to [facility]

## 2020-08-08 NOTE — Procedures (Signed)
Interventional Radiology Procedure:   Indications: Liver lesions and most compatible with metastatic disease  Procedure: US guided liver lesion biopsy  Findings: Very heterogenous liver c/w diffuse lesions.  3 cores from right hepatic lobe. Gelfoam slurry injected along needle tract.  No immediate bleeding.  Brief period of hypotension after sedation given and prior to needle stick.   Complications: None     EBL: less than 10 ml  Plan: Bedrest and transfer back to [facility].   Charles Sherman R. Anselm Pancoast, MD  Pager: [phone]

## 2020-08-08 NOTE — Sedation Documentation (Signed)
Patient moved to Radiology Nurses Station,  Report given to Lytle Butte RN

## 2020-08-08 NOTE — Progress Notes (Signed)
Patient returned from [facility] , VSS , patient requesting food. Small bandaid to RUQ Clean dry and intact. Denies pain or discomfort.

## 2020-08-08 NOTE — Sedation Documentation (Signed)
First sedation med at 1336. BP at 1340 89/70, heart rate 74. Repeat BP 73/45 HR 54. Query vasovagal. Repeat BP 57/33 HR 52. Mentation is decreased but responds to voice and follows commands, not diaphoretic, nail beds pink and skin warm. Placed in trendelenberg and IV fluids increased to 999cc per hour for 2-3 minutes (hx of CHF) Heart rate and BP improved quickly to 96/69 and 133/78 and heart rate improved to 70's and 80's. Patient placed in flat position and BP remained stable. Dr Anselm Pancoast had cleaned the site prior to the drop in BP and heart rate but had not started procedure. Waited for patient to stabilize and then Dr Anselm Pancoast proceeded with biopsy. Patient tolerated the procedure well.

## 2020-08-08 NOTE — TOC Initial Note (Signed)
Transition of Care [facility]) - Initial/Assessment Note    Patient Details  Name: Charles Sherman MRN: [id number] Date of Birth: 1945/09/30  Transition of Care [facility]) CM/SW Contact:    Iona Beard, [place] Phone Number: 08/08/2020, 3:18 PM  Clinical Narrative:                 [facility] consulted for referral for hospice at home. CSW spoke with pts sister Claudine and pts wife [place] via phone call to confirm they are interested in taking pt home with hospice. They are agreeable and would like to use [facility]. CSW spoke to [place] with Hospice who accepts referral and will reach out to the family. TOC to follow.   Expected Discharge Plan: Home w Hospice Care Barriers to Discharge: Continued Medical Work up   Patient Goals and CMS Choice Patient states their goals for this hospitalization and ongoing recovery are:: Home with hospice CMS Medicare.gov Compare Post Acute Care list provided to:: Patient Represenative (must comment) Choice offered to / list presented to : [facility] POA / Guardian, Spouse  Expected Discharge Plan and Services Expected Discharge Plan: Home w Hospice Care In-house Referral: Clinical Social Work Discharge Planning Services: CM Consult Post Acute Care Choice: Hospice Living arrangements for the past 2 months: Single Family Home                                      Prior Living Arrangements/Services Living arrangements for the past 2 months: Single Family Home Lives with:: Spouse Patient language and need for interpreter reviewed:: Yes Do you feel safe going back to the place where you live?: Yes      Need for Family Participation in Patient Care: Yes (Comment) Care giver support system in place?: Yes (comment)   Criminal Activity/Legal Involvement Pertinent to Current Situation/Hospitalization: No - Comment as needed  Activities of Daily Living Home Assistive Devices/Equipment: Wheelchair, Environmental consultant (specify type) ADL Screening  (condition at time of admission) Patient's cognitive ability adequate to safely complete daily activities?: Yes Is the patient deaf or have difficulty hearing?: No Does the patient have difficulty seeing, even when wearing glasses/contacts?: No Does the patient have difficulty concentrating, remembering, or making decisions?: No Patient able to express need for assistance with ADLs?: Yes Does the patient have difficulty dressing or bathing?: No Independently performs ADLs?: No Communication: Independent Dressing (OT): Needs assistance Is this a change from baseline?: Pre-admission baseline Grooming: Independent Feeding: Independent Bathing: Needs assistance Is this a change from baseline?: Pre-admission baseline Toileting: Needs assistance Is this a change from baseline?: Pre-admission baseline In/Out Bed: Needs assistance Is this a change from baseline?: Pre-admission baseline Walks in Home: Needs assistance Is this a change from baseline?: Pre-admission baseline Does the patient have difficulty walking or climbing stairs?: Yes Weakness of Legs: Both Weakness of Arms/Hands: Left  Permission Sought/Granted                  Emotional Assessment Appearance:: Appears stated age Attitude/Demeanor/Rapport: Engaged Affect (typically observed): Accepting Orientation: : Oriented to Self, Oriented to Place, Oriented to  Time, Oriented to Situation Alcohol / Substance Use: Not Applicable Psych Involvement: No (comment)  Admission diagnosis:  Hyperbilirubinemia [E80.6] AKI (acute kidney injury) [facility]) [N17.9] Patient Active Problem List   Diagnosis Date Noted   Lung mass 08/08/2020   Hepatic steatosis 08/07/2020   Hypokalemia 08/07/2020   Transaminitis 08/07/2020  Obesity (BMI 30.0-34.9) 08/07/2020   GERD (gastroesophageal reflux disease) 08/07/2020   CAD (coronary artery disease) 08/07/2020   Metastatic cancer to liver ([place])    Hyperbilirubinemia 08/06/2020   Slow transit  constipation    Thrombocytopenia (HCC)    AKI (acute kidney injury) ([place])    Hemiparesis affecting left side as late effect of stroke (HCC)    Morbid obesity (HCC)    Dysphagia, post-stroke    Chronic diastolic CHF (congestive heart failure) ([place])    Essential hypertension    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   Cerebral thrombosis with cerebral infarction 11/15/2019   Meningioma ([place]) 11/14/2019   PCP:  Jacqlyn Larsen, MD Pharmacy:   [facility] [address] [place] [address] [place] [address] Phone: [phone] Fax: [phone]  [place], [place] [address] Phone: [phone] Fax: [phone]     Social Determinants of Health (SDOH) Interventions    Readmission Risk Interventions Readmission Risk Prevention Plan 08/08/2020  Transportation Screening Complete  Home Care Screening Complete  Medication Review (RN CM) Complete  Some recent data might be hidden

## 2020-08-08 NOTE — Plan of Care (Signed)

## 2020-08-08 NOTE — Sedation Documentation (Signed)
Report called to Ander Purpura, patient nurse in [facility].

## 2020-08-09 LAB — CBC WITH DIFFERENTIAL/PLATELET
Abs Immature Granulocytes: 0.05 10*3/uL (ref 0.00–0.07)
Basophils Absolute: 0.1 10*3/uL (ref 0.0–0.1)
Basophils Relative: 1 %
Eosinophils Absolute: 0.1 10*3/uL (ref 0.0–0.5)
Eosinophils Relative: 1 %
HCT: 42.2 % (ref 39.0–52.0)
Hemoglobin: 14.7 g/dL (ref 13.0–17.0)
Immature Granulocytes: 1 %
Lymphocytes Relative: 19 %
Lymphs Abs: 1.9 10*3/uL (ref 0.7–4.0)
MCH: 31.9 pg (ref 26.0–34.0)
MCHC: 34.8 g/dL (ref 30.0–36.0)
MCV: 91.5 fL (ref 80.0–100.0)
Monocytes Absolute: 1.6 10*3/uL — ABNORMAL HIGH (ref 0.1–1.0)
Monocytes Relative: 17 %
Neutro Abs: 6 10*3/uL (ref 1.7–7.7)
Neutrophils Relative %: 61 %
Platelets: 181 10*3/uL (ref 150–400)
RBC: 4.61 MIL/uL (ref 4.22–5.81)
RDW: 18.9 % — ABNORMAL HIGH (ref 11.5–15.5)
WBC: 9.7 10*3/uL (ref 4.0–10.5)
nRBC: 0 % (ref 0.0–0.2)

## 2020-08-09 LAB — COMPREHENSIVE METABOLIC PANEL
ALT: 124 U/L — ABNORMAL HIGH (ref 0–44)
AST: 233 U/L — ABNORMAL HIGH (ref 15–41)
Albumin: 2.5 g/dL — ABNORMAL LOW (ref 3.5–5.0)
Alkaline Phosphatase: 161 U/L — ABNORMAL HIGH (ref 38–126)
Anion gap: 7 (ref 5–15)
BUN: 28 mg/dL — ABNORMAL HIGH (ref 8–23)
CO2: 28 mmol/L (ref 22–32)
Calcium: 9.6 mg/dL (ref 8.9–10.3)
Chloride: 105 mmol/L (ref 98–111)
Creatinine, Ser: 1.51 mg/dL — ABNORMAL HIGH (ref 0.61–1.24)
GFR, Estimated: 48 mL/min — ABNORMAL LOW (ref >60)
Glucose, Bld: 84 mg/dL (ref 70–99)
Potassium: 3.9 mmol/L (ref 3.5–5.1)
Sodium: 140 mmol/L (ref 135–145)
Total Bilirubin: 17.3 mg/dL — ABNORMAL HIGH (ref 0.3–1.2)
Total Protein: 6.1 g/dL — ABNORMAL LOW (ref 6.5–8.1)

## 2020-08-09 NOTE — Progress Notes (Signed)
Discussed goals of care with the patient.  Diagnosis and prognosis were discussed.  He wants to be DNR.  He had stated that he wanted to be DNR home 2 days ago as well.  This was discussed with Ms. Charles Sherman, patient's sister who is also the HPOA.  She said she is agreeable to DNR and will go with what ever the patient says.  CODE STATUS will be changed in the chart plan to discharge patient home with hospice and comfort measures.  Patient and his sister, Charles Sherman, are agreeable to this plan.

## 2020-08-09 NOTE — TOC Transition Note (Signed)
Transition of Care [facility]) - CM/SW Discharge Note   Patient Details  Name: Charles Sherman MRN: [id number] Date of Birth: 10/08/1945  Transition of [facility] CM/SW Contact:  Natasha Bence, LCSW Phone Number: 08/09/2020, 12:31 PM   Clinical Narrative:    CSW notified of patient's readiness for discharge. CSW notified patient's wife and Otila Kluver with [facility] hospice of discharge. CSW completed med necessity. EMS agreeable to transport patient home. TOC signing off.    Final next level of care: Home w Hospice Care Barriers to Discharge: Barriers Resolved   Patient Goals and CMS Choice Patient states their goals for this hospitalization and ongoing recovery are:: Return home with hospice CMS Medicare.gov Compare Post Acute Care list provided to:: Patient Choice offered to / list presented to : Patient  Discharge Placement                Patient to be transferred to facility by: [facility] Name of family member notified: Wartman,Letricia (Spouse)   [phone] Patient and family notified of of transfer: 08/09/20  Discharge Plan and Services In-house Referral: Clinical Social Work Discharge Planning Services: CM Consult Post Acute Care Choice: Hospice                               Social Determinants of Health (SDOH) Interventions     Readmission Risk Interventions Readmission Risk Prevention Plan 08/08/2020  Transportation Screening Complete  Home Care Screening Complete  Medication Review (RN CM) Complete  Some recent data might be hidden

## 2020-08-09 NOTE — Discharge Summary (Signed)
Physician Discharge Summary  Charles Sherman DJM:[id number] DOB: 1945-05-12 DOA: 08/06/2020  PCP: Jacqlyn Larsen, MD  Admit date: 08/06/2020 Discharge date: 08/09/2020  Discharge disposition: Home with hospice   Recommendations for Outpatient Follow-Up:   Follow-up with hospice team at home in 1 to 2 days.   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  Diet recommendation:  Diet Order             Diet general           Diet regular Room service appropriate? Yes; Fluid consistency: Thin  Diet effective now                     Code Status: DNR     Hospital Course:   Charles Sherman is a 75 y.o. male with past medical history significant for hypertension, hyperlipidemia, CHF, CAD, CAD, who presented to the hospital because of dark urine and lower abdominal pain.   He was found to have elevated liver enzymes with significant hyperbilirubinemia, multiple liver lesions concerning for metastatic disease and lesions in the lumbar spine and right hemipelvis concerning for metastatic disease.  He also had AKI on CKD stage IIIa.  He was treated with IV fluids and creatinine improved.  Additional work-up showed right upper lobe nodule measuring 1.5 cm suspicious for bronchogenic carcinoma, right hilar adenopathy versus perihilar lung mass measuring approximately 5.2 cm.  He underwent ultrasound guided liver biopsy and pathology result was pending at the time of discharge.  Patient and his family understand that prognosis is poor and they have opted for comfort measures with  hospice.  This has been arranged and patient can be discharged home today with hospice.  Discharge plan was discussed with the patient and his sister, Charles Sherman, who is the HPOA.        Medical Consultants:   Oncologist Gastroenterologist   Discharge Exam:    Vitals:   08/08/20 0359 08/08/20 1544 08/08/20 2133 08/09/20 0527  BP: 135/69 136/80 127/67 138/74  Pulse: 72 88 86 76  Resp: 17 18 18 19   Temp: 98 F (36.7 C) 98.3 F (36.8 C) 99.1 F (37.3 C) 98.9 F (37.2 C)  TempSrc: Oral Oral Oral Oral  SpO2: 99% 97% 96% 100%  Weight:      Height:         GEN: NAD SKIN: Warm and dry EYES: EOMI ENT: MMM CV: RRR PULM: CTA B ABD: soft, ND, NT, +BS CNS: AAO x 3, non focal EXT: No edema or tenderness   The results of significant diagnostics from this hospitalization (including imaging, microbiology, ancillary and laboratory) are listed below for reference.     Procedures and Diagnostic Studies:   US BIOPSY (LIVER)  Result Date: 08/08/2020 INDICATION: 75 year old with innumerable liver lesions. Findings are compatible with metastasis. Patient needs a tissue diagnosis. EXAM: ULTRASOUND-GUIDED LIVER LESION BIOPSY MEDICATIONS: Moderate sedation ANESTHESIA/SEDATION: Moderate (conscious) sedation was employed during this procedure. A total of Versed 0.5 mg and Fentanyl 25 mcg was administered intravenously. Moderate Sedation Time: 29 minutes. The patient's level of consciousness and  vital signs were monitored continuously by radiology nursing throughout the procedure under my direct supervision. FLUOROSCOPY TIME:  None COMPLICATIONS: None immediate. PROCEDURE: Informed consent was obtained from the patient's wife. Maximal Sterile Barrier Technique was utilized including caps, mask, sterile gowns, sterile gloves, sterile drape, hand hygiene and skin antiseptic. A timeout was performed prior to the initiation of the procedure. Liver was evaluated with ultrasound. Right hepatic lobe was  targeted for biopsy. Right side of the abdomen was prepped and draped in sterile fashion. Following the initiation of moderate sedation, the patient became hypotensive but remained awake and alert. Patient was given a small fluid bolus. Shortly after the fluid bolus, the patient's blood pressure returned to baseline. The right side of the abdomen was again prepped in a sterile fashion. Skin was anesthetized with 1% lidocaine. Small incision was made. Using ultrasound guidance, 17 gauge coaxial needle was directed in the right hepatic lobe. Total of 3 core biopsies were obtained with an 18 gauge core device. Specimens placed in formalin. Gel-Foam slurry was injected along the needle track at the end of the procedure. Bandage placed over the puncture site. FINDINGS: Liver is diffusely heterogeneous and compatible with innumerable lesions. Findings are compatible with recent MRI findings. Core biopsies were obtained from small hyperechoic lesions in the right hepatic lobe. No immediate bleeding. IMPRESSION: Liver is diffusely heterogeneous and appears to have innumerable liver lesions. Core biopsies obtained from the right hepatic lobe. Electronically Signed   By: Markus Daft M.D.   On: 08/08/2020 18:00     Labs:   Basic Metabolic Panel: Recent Labs  Lab 08/06/20 1733 08/07/20 0636 08/09/20 0850  NA 141 140 140  K 3.3* 3.8 3.9  CL 101 103 105  CO2 29 28 28   GLUCOSE 134* 100* 84  BUN 30* 31* 28*  CREATININE 2.06* 1.97* 1.51*  CALCIUM 9.9 9.8 9.6  MG  --  2.5*  --   PHOS  --  3.3  --    GFR Estimated Creatinine Clearance: 52 mL/min (A) (by C-G formula based on SCr of 1.51 mg/dL (H)). Liver Function Tests: Recent Labs  Lab 08/06/20 1733 08/07/20 0636 08/09/20 0850  AST 265* 228* 233*  ALT 171* 151* 124*  ALKPHOS 199* 181* 161*  BILITOT 14.9* 14.6* 17.3*  PROT 7.4 6.6 6.1*  ALBUMIN 3.1* 2.9* 2.5*   No results for input(s): LIPASE, AMYLASE in the last 168 hours. Recent Labs  Lab  08/06/20 2018  AMMONIA 33   Coagulation profile Recent Labs  Lab 08/06/20 1924 08/07/20 0636  INR 1.3* 1.2    CBC: Recent Labs  Lab 08/06/20 1733 08/07/20 0636 08/09/20 0612  WBC 7.6 8.7 9.7  NEUTROABS 5.1  --  6.0  HGB 14.4 14.1 14.7  HCT 41.8 40.3 42.2  MCV 95.0 93.5 91.5  PLT 223 171 181   Cardiac Enzymes: No results for input(s): CKTOTAL, CKMB, CKMBINDEX, TROPONINI in the last 168 hours. BNP: Invalid input(s): POCBNP CBG: No results for input(s): GLUCAP in the last 168 hours. D-Dimer No results for input(s): DDIMER in the last 72 hours. Hgb A1c No results for input(s): HGBA1C in the last 72 hours. Lipid Profile No results for input(s): CHOL, HDL, LDLCALC, TRIG, CHOLHDL, LDLDIRECT in the last 72 hours. Thyroid function studies No results for input(s): TSH, T4TOTAL, T3FREE, THYROIDAB in the last 72 hours.  Invalid input(s): FREET3 Anemia work up No results for input(s): VITAMINB12, FOLATE, FERRITIN, TIBC, IRON, RETICCTPCT in the last 72 hours. Microbiology Recent Results (  from the past 240 hour(s))  Resp Panel by RT-PCR (Flu A&B, Covid) Nasopharyngeal Swab     Status: None   Collection Time: 08/06/20  8:01 PM   Specimen: Nasopharyngeal Swab; Nasopharyngeal(NP) swabs in vial transport medium  Result Value Ref Range Status   SARS Coronavirus 2 by RT PCR NEGATIVE NEGATIVE Final    Comment: (NOTE) SARS-CoV-2 target nucleic acids are NOT DETECTED.  The SARS-CoV-2 RNA is generally detectable in upper respiratory specimens during the acute phase of infection. The lowest concentration of SARS-CoV-2 viral copies this assay can detect is 138 copies/mL. A negative result does not preclude SARS-Cov-2 infection and should not be used as the sole basis for treatment or other patient management decisions. A negative result may occur with  improper specimen collection/handling, submission of specimen other than nasopharyngeal swab, presence of viral mutation(s) within  the areas targeted by this assay, and inadequate number of viral copies(<138 copies/mL). A negative result must be combined with clinical observations, patient history, and epidemiological information. The expected result is Negative.  Fact Sheet for Patients:  EntrepreneurPulse.com.au  Fact Sheet for Healthcare Providers:  IncredibleEmployment.be  This test is no t yet approved or cleared by the [place] FDA and  has been authorized for detection and/or diagnosis of SARS-CoV-2 by FDA under an Emergency Use Authorization (EUA). This EUA will remain  in effect (meaning this test can be used) for the duration of the COVID-19 declaration under Section 564(b)(1) of the Act, 21 U.S.C.section 360bbb-3(b)(1), unless the authorization is terminated  or revoked sooner.       Influenza A by PCR NEGATIVE NEGATIVE Final   Influenza B by PCR NEGATIVE NEGATIVE Final    Comment: (NOTE) The Xpert Xpress SARS-CoV-2/FLU/RSV plus assay is intended as an aid in the diagnosis of influenza from Nasopharyngeal swab specimens and should not be used as a sole basis for treatment. Nasal washings and aspirates are unacceptable for Xpert Xpress SARS-CoV-2/FLU/RSV testing.  Fact Sheet for Patients: EntrepreneurPulse.com.au  Fact Sheet for Healthcare Providers: IncredibleEmployment.be  This test is not yet approved or cleared by the [place] FDA and has been authorized for detection and/or diagnosis of SARS-CoV-2 by FDA under an Emergency Use Authorization (EUA). This EUA will remain in effect (meaning this test can be used) for the duration of the COVID-19 declaration under Section 564(b)(1) of the Act, 21 U.S.C. section 360bbb-3(b)(1), unless the authorization is terminated or revoked.  Performed at [facility], [address], [place], [place] [address]      Discharge Instructions:   Discharge Instructions      Diet general   Complete by: As directed    Discharge instructions   Complete by: As directed    Follow-up with hospice team at home in 1 to 2 days.   Increase activity slowly   Complete by: As directed    No wound care   Complete by: As directed       Allergies as of 08/09/2020       Reactions   Shrimp [shellfish Allergy]         Medication List     STOP taking these medications    isosorbide mononitrate 120 MG 24 hr tablet Commonly known as: IMDUR   metolazone 2.5 MG tablet Commonly known as: ZAROXOLYN   ticagrelor 90 MG Tabs tablet Commonly known as: BRILINTA       TAKE these medications    acetaminophen 325 MG tablet Commonly known as: TYLENOL Take 2 tablets (650 mg  total) by mouth every 4 (four) hours as needed for mild pain (or temp > 37.5 C (99.5 F)).   albuterol 108 (90 Base) MCG/ACT inhaler Commonly known as: VENTOLIN HFA Inhale 2 puffs into the lungs every 6 (six) hours as needed for wheezing or shortness of breath.   amLODipine 10 MG tablet Commonly known as: NORVASC Take 10 mg by mouth daily.   aspirin EC 81 MG tablet Take 81 mg by mouth daily.   atorvastatin 80 MG tablet Commonly known as: LIPITOR Take 1 tablet (80 mg total) by mouth daily at 6 PM.   docusate sodium 100 MG capsule Commonly known as: COLACE Take 100 mg by mouth daily as needed for mild constipation.   furosemide 20 MG tablet Commonly known as: LASIX Take 1 tablet (20 mg total) by mouth daily.   metoprolol tartrate 25 MG tablet Commonly known as: LOPRESSOR Take 25 mg by mouth 2 (two) times daily.   multivitamin tablet Take 1 tablet by mouth daily.   nitroGLYCERIN 0.4 MG SL tablet Commonly known as: NITROSTAT Place 0.4 mg under the tongue every 5 (five) minutes x 3 doses as needed for chest pain (If no relief after 3 rd dose report to the ED).   pantoprazole 40 MG tablet Commonly known as: PROTONIX Take 1 tablet (40 mg total) by mouth daily.   potassium chloride  10 MEQ tablet Commonly known as: KLOR-CON Take 1 tablet (10 mEq total) by mouth 2 (two) times daily.          Time coordinating discharge: 34 minutes  Signed:  Laylanie Kruczek  [facility] 08/09/2020, 12:18 PM   Pager on [email or web address]. If 7PM-7AM, please contact night-coverage at [email or web address]

## 2020-08-15 LAB — SURGICAL PATHOLOGY

## 2020-08-22 DEATH — deceased

## 2020-11-13 ENCOUNTER — Ambulatory Visit: Payer: Medicare Other | Admitting: Adult Health
